# Patient Record
Sex: Female | Born: 1940
Health system: Southern US, Community
[De-identification: ages and names within clinical notes are randomized; demographics above are authoritative.]

## PROBLEM LIST (undated history)

## (undated) DIAGNOSIS — I10 Essential (primary) hypertension: Secondary | ICD-10-CM

## (undated) DIAGNOSIS — E7800 Pure hypercholesterolemia, unspecified: Secondary | ICD-10-CM

## (undated) DIAGNOSIS — G43909 Migraine, unspecified, not intractable, without status migrainosus: Secondary | ICD-10-CM

## (undated) DIAGNOSIS — T8859XA Other complications of anesthesia, initial encounter: Secondary | ICD-10-CM

## (undated) DIAGNOSIS — F32A Depression, unspecified: Secondary | ICD-10-CM

## (undated) DIAGNOSIS — F329 Major depressive disorder, single episode, unspecified: Secondary | ICD-10-CM

## (undated) DIAGNOSIS — H409 Unspecified glaucoma: Secondary | ICD-10-CM

## (undated) DIAGNOSIS — Z972 Presence of dental prosthetic device (complete) (partial): Secondary | ICD-10-CM

## (undated) DIAGNOSIS — M1990 Unspecified osteoarthritis, unspecified site: Secondary | ICD-10-CM

## (undated) DIAGNOSIS — T4145XA Adverse effect of unspecified anesthetic, initial encounter: Secondary | ICD-10-CM

## (undated) DIAGNOSIS — D649 Anemia, unspecified: Secondary | ICD-10-CM

## (undated) DIAGNOSIS — F419 Anxiety disorder, unspecified: Secondary | ICD-10-CM

## (undated) DIAGNOSIS — M810 Age-related osteoporosis without current pathological fracture: Secondary | ICD-10-CM

## (undated) DIAGNOSIS — K219 Gastro-esophageal reflux disease without esophagitis: Secondary | ICD-10-CM

## (undated) DIAGNOSIS — R42 Dizziness and giddiness: Secondary | ICD-10-CM

## (undated) DIAGNOSIS — H5789 Other specified disorders of eye and adnexa: Secondary | ICD-10-CM

## (undated) DIAGNOSIS — R29898 Other symptoms and signs involving the musculoskeletal system: Secondary | ICD-10-CM

## (undated) HISTORY — PX: SUBCLAVIAN ANGIOGRAM: SHX5350

## (undated) HISTORY — DX: Major depressive disorder, single episode, unspecified: F32.9

## (undated) HISTORY — PX: KNEE SURGERY: SHX244

## (undated) HISTORY — DX: Depression, unspecified: F32.A

## (undated) HISTORY — PX: EYE SURGERY: SHX253

## (undated) HISTORY — DX: Dizziness and giddiness: R42

## (undated) HISTORY — DX: Pure hypercholesterolemia, unspecified: E78.00

## (undated) HISTORY — PX: ABDOMINAL HYSTERECTOMY: SHX81

## (undated) HISTORY — PX: OTHER SURGICAL HISTORY: SHX169

## (undated) HISTORY — PX: WISDOM TOOTH EXTRACTION: SHX21

## (undated) HISTORY — DX: Essential (primary) hypertension: I10

## (undated) HISTORY — DX: Gastro-esophageal reflux disease without esophagitis: K21.9

## (undated) HISTORY — PX: ANAL FISSURE REPAIR: SHX2312

## (undated) HISTORY — DX: Anxiety disorder, unspecified: F41.9

---
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
## 2007-06-24 ENCOUNTER — Encounter: Payer: Self-pay | Admitting: Neurology

## 2007-06-29 ENCOUNTER — Ambulatory Visit (HOSPITAL_COMMUNITY): Admission: RE | Admit: 2007-06-29 | Discharge: 2007-06-29 | Payer: Self-pay | Admitting: Interventional Radiology

## 2007-10-04 ENCOUNTER — Ambulatory Visit: Payer: Self-pay | Admitting: Gastroenterology

## 2010-11-16 HISTORY — PX: COLONOSCOPY: SHX174

## 2010-11-16 HISTORY — PX: UPPER GI ENDOSCOPY: SHX6162

## 2011-08-31 LAB — PROTIME-INR
INR: 1
Prothrombin Time: 13.3

## 2011-08-31 LAB — BASIC METABOLIC PANEL
BUN: 19
CO2: 20
Calcium: 9.4
Chloride: 111
Creatinine, Ser: 0.87
GFR calc Af Amer: >60
GFR calc non Af Amer: >60
Glucose, Bld: 88
Potassium: 3.5
Sodium: 142

## 2011-08-31 LAB — APTT: aPTT: 31

## 2011-08-31 LAB — CBC
HCT: 40.3
Hemoglobin: 13.7
MCHC: 33.9
MCV: 85.4
Platelets: 207
RBC: 4.72
RDW: 13.3
WBC: 6.5

## 2014-02-23 ENCOUNTER — Ambulatory Visit: Payer: Self-pay | Admitting: Family Medicine

## 2014-08-23 ENCOUNTER — Ambulatory Visit: Payer: Self-pay | Admitting: Family Medicine

## 2014-11-21 DIAGNOSIS — N812 Incomplete uterovaginal prolapse: Secondary | ICD-10-CM | POA: Diagnosis not present

## 2015-01-23 DIAGNOSIS — E784 Other hyperlipidemia: Secondary | ICD-10-CM | POA: Diagnosis not present

## 2015-01-23 DIAGNOSIS — R05 Cough: Secondary | ICD-10-CM | POA: Diagnosis not present

## 2015-01-23 DIAGNOSIS — I10 Essential (primary) hypertension: Secondary | ICD-10-CM | POA: Diagnosis not present

## 2015-01-31 DIAGNOSIS — F321 Major depressive disorder, single episode, moderate: Secondary | ICD-10-CM | POA: Diagnosis not present

## 2015-03-12 DIAGNOSIS — E785 Hyperlipidemia, unspecified: Secondary | ICD-10-CM | POA: Diagnosis not present

## 2015-03-12 DIAGNOSIS — I10 Essential (primary) hypertension: Secondary | ICD-10-CM | POA: Diagnosis not present

## 2015-03-13 DIAGNOSIS — N819 Female genital prolapse, unspecified: Secondary | ICD-10-CM | POA: Diagnosis not present

## 2015-04-25 ENCOUNTER — Other Ambulatory Visit: Payer: Self-pay | Admitting: Psychiatry

## 2015-05-06 DIAGNOSIS — H04123 Dry eye syndrome of bilateral lacrimal glands: Secondary | ICD-10-CM | POA: Diagnosis not present

## 2015-05-06 DIAGNOSIS — H4011X2 Primary open-angle glaucoma, moderate stage: Secondary | ICD-10-CM | POA: Diagnosis not present

## 2015-05-07 DIAGNOSIS — H524 Presbyopia: Secondary | ICD-10-CM | POA: Diagnosis not present

## 2015-05-07 DIAGNOSIS — H5210 Myopia, unspecified eye: Secondary | ICD-10-CM | POA: Diagnosis not present

## 2015-05-29 ENCOUNTER — Encounter: Payer: Self-pay | Admitting: Family Medicine

## 2015-06-03 DIAGNOSIS — H4011X2 Primary open-angle glaucoma, moderate stage: Secondary | ICD-10-CM | POA: Diagnosis not present

## 2015-06-03 DIAGNOSIS — H04123 Dry eye syndrome of bilateral lacrimal glands: Secondary | ICD-10-CM | POA: Diagnosis not present

## 2015-06-04 ENCOUNTER — Encounter: Payer: Self-pay | Admitting: Family Medicine

## 2015-06-04 ENCOUNTER — Ambulatory Visit (INDEPENDENT_AMBULATORY_CARE_PROVIDER_SITE_OTHER): Payer: Commercial Managed Care - HMO | Admitting: Family Medicine

## 2015-06-04 VITALS — BP 130/78 | HR 86 | Temp 99.4°F | Resp 18 | Ht 63.0 in | Wt 165.2 lb

## 2015-06-04 DIAGNOSIS — R29898 Other symptoms and signs involving the musculoskeletal system: Secondary | ICD-10-CM | POA: Diagnosis not present

## 2015-06-04 DIAGNOSIS — Z780 Asymptomatic menopausal state: Secondary | ICD-10-CM | POA: Diagnosis not present

## 2015-06-04 DIAGNOSIS — Z0000 Encounter for general adult medical examination without abnormal findings: Secondary | ICD-10-CM | POA: Diagnosis not present

## 2015-06-04 NOTE — Progress Notes (Signed)
Name: [name]   MRN: [id number]    DOB: [birth year]   Date:06/04/2015       Progress Note  Subjective  Chief Complaint  Chief Complaint  Patient presents with  . Annual Exam    CPE  . Hyperlipidemia  . Depression  . Hypertension  . Referral    for Leg pain    HPI Pt. Is here for a Complete Physical Exam. She is doing well and gets her GYN exam in [place]. Past Medical History  Diagnosis Date  . Depression   . Anxiety   . Hypertension   . GERD (gastroesophageal reflux disease)     Past Surgical History  Procedure Laterality Date  . Knee surgery Left     Family History  Problem Relation Age of Onset  . Cancer Mother     colon  . Cancer Brother     prostate  . Lymphoma Son     History   Social History  . Marital Status: Married    Spouse Name: N/A  . Number of Children: N/A  . Years of Education: N/A   Occupational History  . Not on file.   Social History Main Topics  . Smoking status: Never Smoker   . Smokeless tobacco: Never Used  . Alcohol Use: No  . Drug Use: No  . Sexual Activity: Not on file   Other Topics Concern  . Not on file   Social History Narrative  . No narrative on file     Current outpatient prescriptions:  .  ALPRAZolam (XANAX) 0.25 MG tablet, Take by mouth., Disp: , Rfl:  .  aspirin 81 MG tablet, Take 1 tablet by mouth daily., Disp: , Rfl:  .  COMBIGAN 0.2-0.5 % ophthalmic solution, INSTILL 1 DROP IN BOTH EYES TWICE A DAY, Disp: , Rfl: 3 .  Light Mineral Oil-Mineral Oil (RETAINE MGD OP), Apply to eye., Disp: , Rfl:  .  losartan (COZAAR) 50 MG tablet, Take 50 mg by mouth daily., Disp: , Rfl: 5 .  LUMIGAN 0.01 % SOLN, Place 1 drop into both eyes at bedtime., Disp: , Rfl: 1 .  mirtazapine (REMERON) 15 MG tablet, TAKE 1 TABLET AT BEDTIME, Disp: 30 tablet, Rfl: 1 .  Naproxen Sodium (ALEVE) 220 MG CAPS, Take 220 mg by mouth as needed., Disp: , Rfl:  .  omeprazole (PRILOSEC) 20 MG capsule, Take 20 mg by mouth 2 (two)  times daily., Disp: , Rfl: 5 .  Polyethyl Glycol-Propyl Glycol (SYSTANE OP), Apply to eye., Disp: , Rfl:   Allergies  Allergen Reactions  . Codeine   . Sulfa Antibiotics      Review of Systems  Constitutional: Positive for malaise/fatigue. Negative for fever, chills and weight loss.  HENT: Positive for congestion. Negative for ear pain, nosebleeds and sore throat.   Eyes: Positive for double vision. Negative for blurred vision, pain and redness.  Respiratory: Positive for wheezing. Negative for cough and shortness of breath.   Cardiovascular: Positive for leg swelling (intermittent bilateral leg swelling for 2 weeks). Negative for chest pain and palpitations.  Gastrointestinal: Negative for heartburn, nausea, vomiting, abdominal pain and blood in stool.  Genitourinary: Negative for dysuria, frequency and hematuria.  Musculoskeletal: Positive for myalgias and joint pain (intermittent right knee pain, and legs give out at times.). Negative for back pain, falls and neck pain.  Skin: Negative for rash.  Neurological: Positive for weakness. Negative for dizziness, seizures and headaches.  Endo/Heme/Allergies: Bruises/bleeds easily (pt. on ASA).  Psychiatric/Behavioral: Positive for depression. Negative for suicidal ideas and memory loss. The patient is nervous/anxious and has insomnia (stable on medication.).      Objective  Filed Vitals:   06/04/15 0922  BP: 130/78  Pulse: 86  Temp: 99.4 F (37.4 C)  TempSrc: Oral  Resp: 18  Height: 5\' 3"  (1.6 m)  Weight: 165 lb 3.2 oz (74.934 kg)  SpO2: 94%    Physical Exam  Constitutional: She is oriented to person, place, and time and well-developed, well-nourished, and in no distress.  HENT:  Head: Normocephalic and atraumatic.  Nose: No mucosal edema or sinus tenderness.  Mouth/Throat: No oropharyngeal exudate, posterior oropharyngeal edema or posterior oropharyngeal erythema.  Eyes: Pupils are equal, round, and reactive to light.   Neck: Normal range of motion. Neck supple.  Cardiovascular: Normal rate and regular rhythm.   Pulmonary/Chest: Effort normal and breath sounds normal.  Abdominal: Soft. Bowel sounds are normal.  Musculoskeletal:       Right knee: She exhibits normal range of motion, no swelling and no erythema.  Neurological: She is alert and oriented to person, place, and time.  Skin: Skin is warm and dry.  Psychiatric: Mood, memory, affect and judgment normal.  Nursing note and vitals reviewed.    Assessment & Plan 1. Annual physical exam Pt.'s last screening colonoscopy was in 2012. She will contact the gastroenterologist to determine the date for next colonoscopy. - Lipid Profile - TSH - Vitamin D (25 hydroxy) - Comprehensive metabolic panel - CBC w/Diff - HgB A1c  2. Post-menopausal  - DG Bone Density; Future  3. Bilateral leg weakness Pt. Requesting referral to orthopedic surgeon Dr. Marlou Sa who operated on her left knee in the past.  - Ambulatory referral to Orthopedic Surgery    Heru Montz [facility] 06/04/2015 9:51 AM

## 2015-06-05 LAB — COMPREHENSIVE METABOLIC PANEL
ALT: 19 IU/L (ref 0–32)
AST: 18 IU/L (ref 0–40)
Albumin/Globulin Ratio: 1.6 (ref 1.1–2.5)
Albumin: 4.4 g/dL (ref 3.5–4.8)
Alkaline Phosphatase: 75 IU/L (ref 39–117)
BUN/Creatinine Ratio: 30 — ABNORMAL HIGH (ref 11–26)
BUN: 23 mg/dL (ref 8–27)
Bilirubin Total: 0.4 mg/dL (ref 0.0–1.2)
CO2: 24 mmol/L (ref 18–29)
Calcium: 9.9 mg/dL (ref 8.7–10.3)
Chloride: 108 mmol/L (ref 97–108)
Creatinine, Ser: 0.76 mg/dL (ref 0.57–1.00)
GFR calc Af Amer: 89 mL/min/{1.73_m2} (ref >59)
GFR calc non Af Amer: 78 mL/min/{1.73_m2} (ref >59)
Globulin, Total: 2.7 g/dL (ref 1.5–4.5)
Glucose: 91 mg/dL (ref 65–99)
Potassium: 5.3 mmol/L — ABNORMAL HIGH (ref 3.5–5.2)
Sodium: 147 mmol/L — ABNORMAL HIGH (ref 134–144)
Total Protein: 7.1 g/dL (ref 6.0–8.5)

## 2015-06-05 LAB — VITAMIN D 25 HYDROXY (VIT D DEFICIENCY, FRACTURES): Vit D, 25-Hydroxy: 30.1 ng/mL (ref 30.0–100.0)

## 2015-06-05 LAB — CBC WITH DIFFERENTIAL/PLATELET
Basophils Absolute: 0 10*3/uL (ref 0.0–0.2)
Basos: 1 %
EOS (ABSOLUTE): 0.3 10*3/uL (ref 0.0–0.4)
Eos: 5 %
Hematocrit: 39.6 % (ref 34.0–46.6)
Hemoglobin: 13.3 g/dL (ref 11.1–15.9)
Immature Grans (Abs): 0 10*3/uL (ref 0.0–0.1)
Immature Granulocytes: 0 %
Lymphocytes Absolute: 2.6 10*3/uL (ref 0.7–3.1)
Lymphs: 39 %
MCH: 27.9 pg (ref 26.6–33.0)
MCHC: 33.6 g/dL (ref 31.5–35.7)
MCV: 83 fL (ref 79–97)
Monocytes Absolute: 0.4 10*3/uL (ref 0.1–0.9)
Monocytes: 7 %
Neutrophils Absolute: 3.3 10*3/uL (ref 1.4–7.0)
Neutrophils: 48 %
Platelets: 254 10*3/uL (ref 150–379)
RBC: 4.77 x10E6/uL (ref 3.77–5.28)
RDW: 14.8 % (ref 12.3–15.4)
WBC: 6.7 10*3/uL (ref 3.4–10.8)

## 2015-06-05 LAB — TSH: TSH: 3.87 u[IU]/mL (ref 0.450–4.500)

## 2015-06-05 LAB — LIPID PANEL
Chol/HDL Ratio: 4.5 ratio units — ABNORMAL HIGH (ref 0.0–4.4)
Cholesterol, Total: 248 mg/dL — ABNORMAL HIGH (ref 100–199)
HDL: 55 mg/dL (ref >39)
LDL Calculated: 162 mg/dL — ABNORMAL HIGH (ref 0–99)
Triglycerides: 157 mg/dL — ABNORMAL HIGH (ref 0–149)
VLDL Cholesterol Cal: 31 mg/dL (ref 5–40)

## 2015-06-05 LAB — HEMOGLOBIN A1C
Est. average glucose Bld gHb Est-mCnc: 120 mg/dL
Hgb A1c MFr Bld: 5.8 % — ABNORMAL HIGH (ref 4.8–5.6)

## 2015-06-06 ENCOUNTER — Other Ambulatory Visit: Payer: Self-pay | Admitting: Family Medicine

## 2015-06-06 DIAGNOSIS — E875 Hyperkalemia: Secondary | ICD-10-CM

## 2015-06-06 MED ORDER — ATORVASTATIN CALCIUM 20 MG PO TABS: 20.0000 mg | ORAL_TABLET | Freq: Every day | ORAL | Status: DC

## 2015-06-06 NOTE — Telephone Encounter (Signed)
Per Dr. Manuella Ghazi filled Lipitor 20 mg sent to [facility] [address], result were reported to Patient.

## 2015-06-07 ENCOUNTER — Other Ambulatory Visit: Payer: Self-pay | Admitting: Family Medicine

## 2015-06-07 DIAGNOSIS — E875 Hyperkalemia: Secondary | ICD-10-CM | POA: Diagnosis not present

## 2015-06-08 LAB — BASIC METABOLIC PANEL
BUN/Creatinine Ratio: 21 (ref 11–26)
BUN: 15 mg/dL (ref 8–27)
CO2: 26 mmol/L (ref 18–29)
Calcium: 10 mg/dL (ref 8.7–10.3)
Chloride: 106 mmol/L (ref 97–108)
Creatinine, Ser: 0.72 mg/dL (ref 0.57–1.00)
GFR calc Af Amer: 95 mL/min/{1.73_m2} (ref >59)
GFR calc non Af Amer: 83 mL/min/{1.73_m2} (ref >59)
Glucose: 91 mg/dL (ref 65–99)
Potassium: 4.7 mmol/L (ref 3.5–5.2)
Sodium: 148 mmol/L — ABNORMAL HIGH (ref 134–144)

## 2015-06-11 ENCOUNTER — Other Ambulatory Visit: Payer: Self-pay | Admitting: Family Medicine

## 2015-06-11 DIAGNOSIS — E870 Hyperosmolality and hypernatremia: Secondary | ICD-10-CM

## 2015-06-14 ENCOUNTER — Telehealth: Payer: Self-pay | Admitting: Family Medicine

## 2015-06-14 NOTE — Telephone Encounter (Signed)
Patient is requesting a lab order will pick it up Wednesday for her Sodium check

## 2015-06-17 NOTE — Telephone Encounter (Signed)
Orders for BMP is ready for patient to pick up at front desk.

## 2015-06-19 ENCOUNTER — Other Ambulatory Visit: Payer: Self-pay | Admitting: Family Medicine

## 2015-06-19 DIAGNOSIS — E870 Hyperosmolality and hypernatremia: Secondary | ICD-10-CM | POA: Diagnosis not present

## 2015-06-20 ENCOUNTER — Encounter: Payer: Self-pay | Admitting: Family Medicine

## 2015-06-20 ENCOUNTER — Ambulatory Visit (INDEPENDENT_AMBULATORY_CARE_PROVIDER_SITE_OTHER): Payer: Commercial Managed Care - HMO | Admitting: Family Medicine

## 2015-06-20 VITALS — BP 132/74 | HR 81 | Temp 99.1°F | Resp 18 | Ht 63.0 in | Wt 165.9 lb

## 2015-06-20 DIAGNOSIS — N649 Disorder of breast, unspecified: Secondary | ICD-10-CM | POA: Insufficient documentation

## 2015-06-20 DIAGNOSIS — R600 Localized edema: Secondary | ICD-10-CM | POA: Diagnosis not present

## 2015-06-20 DIAGNOSIS — F4321 Adjustment disorder with depressed mood: Secondary | ICD-10-CM | POA: Insufficient documentation

## 2015-06-20 DIAGNOSIS — M25519 Pain in unspecified shoulder: Secondary | ICD-10-CM | POA: Insufficient documentation

## 2015-06-20 DIAGNOSIS — I10 Essential (primary) hypertension: Secondary | ICD-10-CM | POA: Insufficient documentation

## 2015-06-20 DIAGNOSIS — R9089 Other abnormal findings on diagnostic imaging of central nervous system: Secondary | ICD-10-CM | POA: Insufficient documentation

## 2015-06-20 DIAGNOSIS — Z8679 Personal history of other diseases of the circulatory system: Secondary | ICD-10-CM | POA: Insufficient documentation

## 2015-06-20 DIAGNOSIS — F32A Depression, unspecified: Secondary | ICD-10-CM | POA: Insufficient documentation

## 2015-06-20 DIAGNOSIS — Z634 Disappearance and death of family member: Secondary | ICD-10-CM

## 2015-06-20 DIAGNOSIS — E870 Hyperosmolality and hypernatremia: Secondary | ICD-10-CM

## 2015-06-20 DIAGNOSIS — F419 Anxiety disorder, unspecified: Secondary | ICD-10-CM | POA: Insufficient documentation

## 2015-06-20 DIAGNOSIS — K921 Melena: Secondary | ICD-10-CM | POA: Insufficient documentation

## 2015-06-20 DIAGNOSIS — F4329 Adjustment disorder with other symptoms: Secondary | ICD-10-CM | POA: Insufficient documentation

## 2015-06-20 DIAGNOSIS — R29898 Other symptoms and signs involving the musculoskeletal system: Secondary | ICD-10-CM

## 2015-06-20 DIAGNOSIS — R42 Dizziness and giddiness: Secondary | ICD-10-CM | POA: Insufficient documentation

## 2015-06-20 DIAGNOSIS — H409 Unspecified glaucoma: Secondary | ICD-10-CM | POA: Insufficient documentation

## 2015-06-20 DIAGNOSIS — F329 Major depressive disorder, single episode, unspecified: Secondary | ICD-10-CM | POA: Insufficient documentation

## 2015-06-20 DIAGNOSIS — E785 Hyperlipidemia, unspecified: Secondary | ICD-10-CM | POA: Insufficient documentation

## 2015-06-20 DIAGNOSIS — G939 Disorder of brain, unspecified: Secondary | ICD-10-CM | POA: Insufficient documentation

## 2015-06-20 DIAGNOSIS — B029 Zoster without complications: Secondary | ICD-10-CM | POA: Insufficient documentation

## 2015-06-20 LAB — BASIC METABOLIC PANEL
BUN/Creatinine Ratio: 32 — ABNORMAL HIGH (ref 11–26)
BUN: 23 mg/dL (ref 8–27)
CO2: 26 mmol/L (ref 18–29)
Calcium: 9.6 mg/dL (ref 8.7–10.3)
Chloride: 109 mmol/L — ABNORMAL HIGH (ref 97–108)
Creatinine, Ser: 0.71 mg/dL (ref 0.57–1.00)
GFR calc Af Amer: 97 mL/min/{1.73_m2} (ref >59)
GFR calc non Af Amer: 84 mL/min/{1.73_m2} (ref >59)
Glucose: 90 mg/dL (ref 65–99)
Potassium: 4.9 mmol/L (ref 3.5–5.2)
Sodium: 149 mmol/L — ABNORMAL HIGH (ref 134–144)

## 2015-06-20 NOTE — Progress Notes (Signed)
Name: [name]   MRN: [id number]    DOB: [birth year]   Date:06/20/2015       Progress Note  Subjective  Chief Complaint  Chief Complaint  Patient presents with  . Follow-up    Elevated Sodium levels    HPI  [name] is a pleasant 74 year old female who is presents to clinic for evaluation of possible causes for recent lab work ordered by her PCP showing slightly elevated sodium. Serum sodium ranges have been 147-149 mmol/L over the past 2 weeks. She denies any symptoms of nausea, vomiting, diarrhea, fevers. She drinks about 2 cups of water a day but also has coffee and ginger ale. Overall she has not been feeling her usual self. Her legs give way when she walks, they feel tired and achy and by the end of the day she feels that her legs are swollen.     Patient Active Problem List   Diagnosis Date Noted  . Abnormal brain CT 06/20/2015  . Anxiety, mild 06/20/2015  . Blood in feces 06/20/2015  . Brain lesion 06/20/2015  . 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 Clinical depression 06/20/2015  . Dyslipidemia 06/20/2015  . Dizziness 06/20/2015  . HLD (hyperlipidemia) 06/20/2015  . Benign hypertension 06/20/2015  . Affective disorder, major 06/20/2015  . Arthralgia of shoulder 06/20/2015  . Herpes zona 06/20/2015  . Breast disease 06/20/2015  . Pain in shoulder 06/20/2015  . Glaucoma 06/20/2015  . Hypernatremia 06/20/2015  . Bilateral lower extremity edema 06/20/2015  . Annual physical exam 06/04/2015  . Post-menopausal 06/04/2015  . Bilateral leg weakness 06/04/2015    History  Substance Use Topics  . Smoking status: Never Smoker   . Smokeless tobacco: Never Used  . Alcohol Use: No     Current outpatient prescriptions:  .  ALPRAZolam (XANAX) 0.25 MG tablet, Take by mouth., Disp: , Rfl:  .  aspirin 81 MG tablet, Take 1 tablet by mouth daily., Disp: , Rfl:  .  atorvastatin (LIPITOR) 20 MG tablet, Take 1 tablet (20 mg total) by mouth at bedtime., Disp:  90 tablet, Rfl: 0 .  COMBIGAN 0.2-0.5 % ophthalmic solution, INSTILL 1 DROP IN BOTH EYES TWICE A DAY, Disp: , Rfl: 3 .  Light Mineral Oil-Mineral Oil (RETAINE MGD OP), Apply to eye., Disp: , Rfl:  .  losartan (COZAAR) 50 MG tablet, Take 50 mg by mouth daily., Disp: , Rfl: 5 .  LUMIGAN 0.01 % SOLN, Place 1 drop into both eyes at bedtime., Disp: , Rfl: 1 .  mirtazapine (REMERON) 15 MG tablet, TAKE 1 TABLET AT BEDTIME, Disp: 30 tablet, Rfl: 1 .  Naproxen Sodium (ALEVE) 220 MG CAPS, Take 220 mg by mouth as needed., Disp: , Rfl:  .  omeprazole (PRILOSEC) 20 MG capsule, Take 20 mg by mouth 2 (two) times daily., Disp: , Rfl: 5 .  Polyethyl Glycol-Propyl Glycol (SYSTANE OP), Apply to eye., Disp: , Rfl:   Past Surgical History  Procedure Laterality Date  . Knee surgery Left     Family History  Problem Relation Age of Onset  . Cancer Mother     colon  . Cancer Brother     prostate  . Lymphoma Son     Allergies  Allergen Reactions  . Codeine   . Sulfa Antibiotics      Review of Systems  CONSTITUTIONAL: No significant weight changes, fever, chills, weakness or fatigue.  HEENT:  - Eyes: No visual changes.  - Ears: No auditory changes.  No pain.  - Nose: No sneezing, congestion, runny nose. - Throat: No sore throat. No changes in swallowing. SKIN: No rash or itching.  CARDIOVASCULAR: No chest pain, chest pressure or chest discomfort. No palpitations. Yes edema. RESPIRATORY: No shortness of breath, cough or sputum.  GASTROINTESTINAL: No anorexia, nausea, vomiting. No changes in bowel habits. No abdominal pain or blood.  GENITOURINARY: No dysuria. No frequency. No discharge.  NEUROLOGICAL: No headache, dizziness, syncope, paralysis, ataxia, numbness or tingling in the extremities. No memory changes. No change in bowel or bladder control.  MUSCULOSKELETAL: Yes joint pain. No muscle pain. HEMATOLOGIC: No anemia, bleeding or bruising.  LYMPHATICS: No enlarged lymph nodes.  PSYCHIATRIC: No  change in mood. No change in sleep pattern.  ENDOCRINOLOGIC: No reports of sweating, cold or heat intolerance. No polyuria or polydipsia.     Objective  BP 132/74 mmHg  Pulse 81  Temp(Src) 99.1 F (37.3 C) (Oral)  Resp 18  Ht 5\' 3"  (1.6 m)  Wt 165 lb 14.4 oz (75.252 kg)  BMI 29.40 kg/m2  SpO2 95% Body mass index is 29.4 kg/(m^2).  Orthostatic vitals Lying: 134/78 mmHg Sitting: 131/76 mmHg Standing: 132/71 mmHg  Physical Exam  Constitutional: Patient appears well-developed and well-nourished. In no distress.  HEENT:  - Head: Normocephalic and atraumatic.  - Ears: Bilateral TMs gray, no erythema or effusion - Nose: Nasal mucosa moist - Mouth/Throat: Oropharynx is clear and mildly dry. No tonsillar hypertrophy or erythema. No post nasal drainage.  - Eyes: Conjunctivae clear, EOM movements normal. PERRLA. No scleral icterus.  Neck: Normal range of motion. Neck supple. No JVD present. No thyromegaly present.  Cardiovascular: Normal rate, regular rhythm and normal heart sounds.  No murmur heard.  Pulmonary/Chest: Effort normal and breath sounds normal. No respiratory distress. Musculoskeletal: Normal range of motion bilateral UE and LE with significant crepitus in knees. Peripheral vascular: Bilateral LE trace pedal pitting edema. Neurological: CN II-XII grossly intact with no focal deficits. Alert and oriented to person, place, and time. Coordination, balance, strength, speech and gait are normal.  Skin: Skin is warm and dry. No rash noted. No erythema. Skin turgor decreased. Psychiatric: Patient has a normal mood and affect. Behavior is normal in office today. Judgment and thought content normal in office today.   Recent Results (from the past 2160 hour(s))  Lipid Profile     Status: Abnormal   Collection Time: 06/04/15 10:29 AM  Result Value Ref Range   Cholesterol, Total 248 (H) 100 - 199 mg/dL   Triglycerides 157 (H) 0 - 149 mg/dL   HDL 55 >39 mg/dL    Comment:  According to ATP-III Guidelines, HDL-C >59 mg/dL is considered a negative risk factor for CHD.    VLDL Cholesterol Cal 31 5 - 40 mg/dL   LDL Calculated 162 (H) 0 - 99 mg/dL   Chol/HDL Ratio 4.5 (H) 0.0 - 4.4 ratio units    Comment:                                   T. Chol/HDL Ratio                                             Men  Women  1/2 Avg.Risk  3.4    3.3                                   Avg.Risk  5.0    4.4                                2X Avg.Risk  9.6    7.1                                3X Avg.Risk 23.4   11.0   TSH     Status: None   Collection Time: 06/04/15 10:29 AM  Result Value Ref Range   TSH 3.870 0.450 - 4.500 uIU/mL  Vitamin D (25 hydroxy)     Status: None   Collection Time: 06/04/15 10:29 AM  Result Value Ref Range   Vit D, 25-Hydroxy 30.1 30.0 - 100.0 ng/mL    Comment: Vitamin D deficiency has been defined by the [place] and an Endocrine Society practice guideline as a level of serum 25-OH vitamin D less than 20 ng/mL (1,2). The Endocrine Society went on to further define vitamin D insufficiency as a level between 21 and 29 ng/mL (2). 1. IOM (Institute of Medicine). 2010. Dietary reference    intakes for calcium and D. [place]: The    [employer]. 2. Holick MF, Binkley , Bischoff-Ferrari HA, et al.    Evaluation, treatment, and prevention of vitamin D    deficiency: an Endocrine Society clinical practice    guideline. JCEM. 2011 Jul; 96(7):1911-30.   Comprehensive metabolic panel     Status: Abnormal   Collection Time: 06/04/15 10:29 AM  Result Value Ref Range   Glucose 91 65 - 99 mg/dL   BUN 23 8 - 27 mg/dL   Creatinine, Ser 0.76 0.57 - 1.00 mg/dL   GFR calc non Af Amer 78 >59 mL/min/1.73   GFR calc Af Amer 89 >59 mL/min/1.73   BUN/Creatinine Ratio 30 (H) 11 - 26   Sodium 147 (H) 134 - 144 mmol/L   Potassium 5.3 (H) 3.5 - 5.2 mmol/L   Chloride 108 97 - 108 mmol/L   CO2 24 18 - 29  mmol/L   Calcium 9.9 8.7 - 10.3 mg/dL   Total Protein 7.1 6.0 - 8.5 g/dL   Albumin 4.4 3.5 - 4.8 g/dL   Globulin, Total 2.7 1.5 - 4.5 g/dL   Albumin/Globulin Ratio 1.6 1.1 - 2.5   Bilirubin Total 0.4 0.0 - 1.2 mg/dL   Alkaline Phosphatase 75 39 - 117 IU/L   AST 18 0 - 40 IU/L   ALT 19 0 - 32 IU/L  CBC w/Diff     Status: None   Collection Time: 06/04/15 10:29 AM  Result Value Ref Range   WBC 6.7 3.4 - 10.8 x10E3/uL   RBC 4.77 3.77 - 5.28 x10E6/uL   Hemoglobin 13.3 11.1 - 15.9 g/dL   Hematocrit 39.6 34.0 - 46.6 %   MCV 83 79 - 97 fL   MCH 27.9 26.6 - 33.0 pg   MCHC 33.6 31.5 - 35.7 g/dL   RDW 14.8 12.3 - 15.4 %   Platelets 254 150 - 379 x10E3/uL   Neutrophils 48 %   Lymphs 39 %   Monocytes 7 %   Eos  5 %   Basos 1 %   Neutrophils Absolute 3.3 1.4 - 7.0 x10E3/uL   Lymphocytes Absolute 2.6 0.7 - 3.1 x10E3/uL   Monocytes Absolute 0.4 0.1 - 0.9 x10E3/uL   EOS (ABSOLUTE) 0.3 0.0 - 0.4 x10E3/uL   Basophils Absolute 0.0 0.0 - 0.2 x10E3/uL   Immature Granulocytes 0 %   Immature Grans (Abs) 0.0 0.0 - 0.1 x10E3/uL  HgB A1c     Status: Abnormal   Collection Time: 06/04/15 10:29 AM  Result Value Ref Range   Hgb A1c MFr Bld 5.8 (H) 4.8 - 5.6 %    Comment:          Pre-diabetes: 5.7 - 6.4          Diabetes: >6.4          Glycemic control for adults with diabetes: <7.0    Est. average glucose Bld gHb Est-mCnc 120 mg/dL  Basic metabolic panel     Status: Abnormal   Collection Time: 06/07/15 10:33 AM  Result Value Ref Range   Glucose 91 65 - 99 mg/dL   BUN 15 8 - 27 mg/dL   Creatinine, Ser 0.72 0.57 - 1.00 mg/dL   GFR calc non Af Amer 83 >59 mL/min/1.73   GFR calc Af Amer 95 >59 mL/min/1.73   BUN/Creatinine Ratio 21 11 - 26   Sodium 148 (H) 134 - 144 mmol/L   Potassium 4.7 3.5 - 5.2 mmol/L   Chloride 106 97 - 108 mmol/L   CO2 26 18 - 29 mmol/L   Calcium 10.0 8.7 - 10.3 mg/dL  Basic metabolic panel     Status: Abnormal   Collection Time: 06/19/15 10:26 AM  Result Value Ref Range    Glucose 90 65 - 99 mg/dL   BUN 23 8 - 27 mg/dL   Creatinine, Ser 0.71 0.57 - 1.00 mg/dL   GFR calc non Af Amer 84 >59 mL/min/1.73   GFR calc Af Amer 97 >59 mL/min/1.73   BUN/Creatinine Ratio 32 (H) 11 - 26   Sodium 149 (H) 134 - 144 mmol/L   Potassium 4.9 3.5 - 5.2 mmol/L   Chloride 109 (H) 97 - 108 mmol/L   CO2 26 18 - 29 mmol/L   Calcium 9.6 8.7 - 10.3 mg/dL     Assessment & Plan  1. Hypernatremia Etiology of hypernatremia unclear at this time but more likely due to hypovolemic hypernatremia, volume depletion as she only drinks 1-2 cups of water a day. Medication list has been reviewed and no causative agent has been identified. Clinically Catherine. Alverio remains stable. She has been instructed to ingest at least eight 8oz glasses of water daily and avoid elevated temperatures that could contribute to dehydration. Lab work slip given to redraw her blood work in 2 weeks time.   - Basic Metabolic Panel (BMET); Future   2. Bilateral leg weakness Consider peripheral arterial disease get ankle brachial index near future or consider venous valve insufficiency with vascular consult.  3. Bilateral lower extremity edema Consider fluid third spacing or cardiac disease. May benefit from Echo cardiogram.

## 2015-06-20 NOTE — Patient Instructions (Signed)
Hypernatremia Hypernatremia is when there is too much salt (sodium) in the blood and not enough water. The cells of the brain become starved of water. This balance of water and salt in the body is vital to human function. Hypernatremia is rare and is often an emergency. It can happen to anyone, but it usually occurs in infants, the elderly, or the critically ill. In severe cases, hypernatremia can be life-threatening. CAUSES Hypernatremia may be the result of:  Not drinking enough water. Impaired thirst sensation usually contributes to this cause.  A condition where your kidneys remove too much urine from the body (polyuria). This is often related to diabetes.  Water loss through exercise or sweating.  Diarrhea or vomiting.  Excessive salt intake.  A genetic condition affecting the kidneys. SYMPTOMS  Increased thirst (unless this sensation is impaired).  Increased urination.  Muscle twitching or cramps.  Dizziness or lightheadedness.  Seizures.  Headache.  Fatigue.  Generalized weakness.  Irritability.  Confusion.  Loss of consciousness.  Coma. Symptoms of underlying illness that can lead to hypernatremia may also be present, such as:  Nausea.  Vomiting.  Diarrhea.  Fever.  Confusion (delirium).  Memory problems (dementia).  Diabetes.  A condition that affects the adrenal glands and results in too much cortisol (Cushing's syndrome). DIAGNOSIS  Your caregiver will take your history and ask questions regarding water intake, potential water loss, urination, salt intake, medicines, and other symptoms. Your caregiver may also perform a physical or neurological exam. He or she may also order tests, such as blood and urine tests. TREATMENT Once diagnosed, hypernatremia needs to be treated right away. The goal is to balance the water and salt levels in the body. Treatment may be given through an intravenous line (IV) or given by mouth. Treatment often requires  hospitalization for IV fluids and careful monitoring of lab tests. Treatment for hypernatremia will include intake of one or more of the following:  Water.  IV fluids. Ongoing laboratory tests will be performed to help guide treatment options. HOME CARE INSTRUCTIONS   Drink enough fluids to keep your urine clear or pale yellow.  Take all medicines as directed. Review all of your medicines with your caregiver regularly.  Avoid high-salt processed foods (canned, jarred, frozen, or boxed foods). Some examples include pickles, frozen dinners, canned soups, potato and corn chips, and olives.  Always replace fluids after exercise or after vomiting or diarrhea.  Manage underlying conditions that may cause hypernatremia. Ask your caregiver for additional support.  Follow up with your caregiver for ongoing monitoring as directed. SEEK MEDICAL CARE IF:  You have questions or concerns. MAKE SURE YOU:  Understand these instructions.  Will watch your condition.  Will get help right away if you are not doing well or get worse. Document Released: 01/25/2012 Document Reviewed: 01/25/2012 [facility] Patient Information 2015 [place]. This information is not intended to replace advice given to you by your health care provider. Make sure you discuss any questions you have with your health care provider.

## 2015-06-26 DIAGNOSIS — M25562 Pain in left knee: Secondary | ICD-10-CM | POA: Diagnosis not present

## 2015-06-26 DIAGNOSIS — M25561 Pain in right knee: Secondary | ICD-10-CM | POA: Diagnosis not present

## 2015-06-26 DIAGNOSIS — M545 Low back pain: Secondary | ICD-10-CM | POA: Diagnosis not present

## 2015-06-27 ENCOUNTER — Ambulatory Visit (INDEPENDENT_AMBULATORY_CARE_PROVIDER_SITE_OTHER): Payer: Commercial Managed Care - HMO | Admitting: Psychiatry

## 2015-06-27 ENCOUNTER — Encounter: Payer: Self-pay | Admitting: Psychiatry

## 2015-06-27 DIAGNOSIS — F320 Major depressive disorder, single episode, mild: Secondary | ICD-10-CM | POA: Diagnosis not present

## 2015-06-27 MED ORDER — SERTRALINE HCL 25 MG PO TABS: 25.0000 mg | ORAL_TABLET | Freq: Every day | ORAL | Status: DC

## 2015-06-27 MED ORDER — ALPRAZOLAM 0.25 MG PO TABS: 0.2500 mg | ORAL_TABLET | Freq: Every evening | ORAL | Status: DC | PRN

## 2015-06-27 NOTE — Progress Notes (Signed)
BH MD/PA/NP OP Progress Note  06/27/2015 2:12 PM [name]  MRN:  [id number]  Subjective:   Patient is a 74 year old female who presented for follow-up appointment. She reported that she has been taking her medications as prescribed. She reported that her sodium level was high when she went to her primary care physician and they've been trying to adjust her medications. She does not want to continue taking the mirtazapine as she has been gaining weight. 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 She stated that she wants to try again the Zoloft at a lower dose as she wants to lose weight. She appeared calm and cooperative during the interview. She currently denied having any suicidal ideations or plans. She denied having any perceptual disturbances no memory issues are noted at this time. Chief Complaint:  Chief Complaint    Follow-up; Anxiety; Depression     Visit Diagnosis:     ICD-9-CM ICD-10-CM   1. MDD (major depressive disorder), single episode, mild 296.21 F32.0     Past Medical History:  Past Medical History  Diagnosis Date  . Depression   . Anxiety   . Hypertension   . GERD (gastroesophageal reflux disease)   . Hypercholesteremia     Past Surgical History  Procedure Laterality Date  . Knee surgery Left   . Subclavian angiogram     Family History:  Family History  Problem Relation Age of Onset  . Cancer Mother     colon  . Cancer Brother     prostate  . Lymphoma Son    Social History:  Social History   Social History  . Marital Status: Married    Spouse Name: N/A  . Number of Children: N/A  . Years of Education: N/A   Social History Main Topics  . Smoking status: Never Smoker   . Smokeless tobacco: Never Used  . Alcohol Use: No  . Drug Use: No  . Sexual Activity: Not Asked   Other Topics Concern  . None   Social History Narrative   Additional History:  Currently lives with her  husband. She brought the bag  of her medications with her.  Assessment:   Musculoskeletal: Strength & Muscle Tone: within normal limits Gait & Station: normal Patient leans: N/A  Psychiatric Specialty Exam: HPI  ROS  There were no vitals taken for this visit.There is no weight on file to calculate BMI.  General Appearance: Casual and Fairly Groomed  Eye Contact:  Fair  Speech:  Clear and Coherent  Volume:  Normal  Mood:  Anxious  Affect:  Congruent  Thought Process:  Coherent, Linear and Logical  Orientation:  Full (Time, Place, and Person)  Thought Content:  WDL  Suicidal Thoughts:  No  Homicidal Thoughts:  No  Memory:  NA  Judgement:  Fair  Insight:  Fair  Psychomotor Activity:  Increased  Concentration:  Fair  Recall:  [employer] of Knowledge: Fair  Language: Fair  Akathisia:  No  Handed:  Right  AIMS (if indicated):    Assets:  Communication Skills Desire for Improvement Social Support  ADL's:  Intact  Cognition: WNL  Sleep:     Is the patient at risk to self?  No. Has the patient been a risk to self in the past 6 months?  No. Has the patient been a risk to self within the distant past?  No. Is the patient a risk to  others?  No. Has the patient been a risk to others in the past 6 months?  No. Has the patient been a risk to others within the distant past?  No.  Current Medications: Current Outpatient Prescriptions  Medication Sig Dispense Refill  . aspirin 81 MG tablet Take 1 tablet by mouth daily.    Marland Kitchen atorvastatin (LIPITOR) 20 MG tablet Take 1 tablet (20 mg total) by mouth at bedtime. 90 tablet 0  . LUMIGAN 0.01 % SOLN Place 1 drop into both eyes at bedtime.  1  . mirtazapine (REMERON) 15 MG tablet TAKE 1 TABLET AT BEDTIME 30 tablet 1  . Naproxen Sodium (ALEVE) 220 MG CAPS Take 220 mg by mouth as needed.    Marland Kitchen omeprazole (PRILOSEC) 20 MG capsule Take 20 mg by mouth 2 (two) times daily.  5  . Polyethyl Glycol-Propyl Glycol (SYSTANE OP) Apply to eye.    Marland Kitchen  ALPRAZolam (XANAX) 0.25 MG tablet Take by mouth.    . COMBIGAN 0.2-0.5 % ophthalmic solution INSTILL 1 DROP IN BOTH EYES TWICE A DAY  3  . diazepam (VALIUM) 5 MG tablet     . Light Mineral Oil-Mineral Oil (RETAINE MGD OP) Apply to eye.    . losartan (COZAAR) 50 MG tablet Take 50 mg by mouth daily.  5  . meloxicam (MOBIC) 7.5 MG tablet      No current facility-administered medications for this visit.    Medical Decision Making:  Review of Psycho-Social Stressors (1), Discuss test with performing physician (1) and Review of Last Therapy Session (1)  Treatment Plan Summary:Medication management  I will discontinue the mirtazapine and start her on Zoloft 25 mg by mouth daily. She takes alprazolam on a when necessary basis. She was given the prescription again. Follow-up in one month.   More than 50% of the time spent in psychoeducation, counseling and coordination of care.    This note was generated in part or whole with voice recognition software. Voice regonition is usually quite accurate but there are transcription errors that can and very often do occur. I apologize for any typographical errors that were not detected and corrected.    Rainey Pines 06/27/2015, 2:12 PM

## 2015-06-28 ENCOUNTER — Other Ambulatory Visit: Payer: Self-pay | Admitting: Psychiatry

## 2015-07-02 ENCOUNTER — Other Ambulatory Visit: Payer: Self-pay

## 2015-07-03 ENCOUNTER — Telehealth: Payer: Self-pay | Admitting: Family Medicine

## 2015-07-03 MED ORDER — OMEPRAZOLE 20 MG PO CPDR: 20.0000 mg | DELAYED_RELEASE_CAPSULE | Freq: Two times a day (BID) | ORAL | Status: DC

## 2015-07-03 NOTE — Telephone Encounter (Signed)
Medication has been refilled and sent to [facility]

## 2015-07-03 NOTE — Telephone Encounter (Signed)
[facility] CALLING SAYING THAT THERE IS A PROBLEM WITH OMEPRAZOLE. THEY ARE NEEDING A REILL REQUEST OKAYED BY DR

## 2015-07-05 ENCOUNTER — Other Ambulatory Visit: Payer: Self-pay | Admitting: Orthopedic Surgery

## 2015-07-05 DIAGNOSIS — E870 Hyperosmolality and hypernatremia: Secondary | ICD-10-CM | POA: Diagnosis not present

## 2015-07-05 DIAGNOSIS — M545 Low back pain: Secondary | ICD-10-CM

## 2015-07-06 LAB — BASIC METABOLIC PANEL
BUN/Creatinine Ratio: 13 (ref 11–26)
BUN: 12 mg/dL (ref 8–27)
CO2: 24 mmol/L (ref 18–29)
Calcium: 9.3 mg/dL (ref 8.7–10.3)
Chloride: 105 mmol/L (ref 97–108)
Creatinine, Ser: 0.9 mg/dL (ref 0.57–1.00)
GFR calc Af Amer: 73 mL/min/{1.73_m2} (ref >59)
GFR calc non Af Amer: 63 mL/min/{1.73_m2} (ref >59)
Glucose: 98 mg/dL (ref 65–99)
Potassium: 4.3 mmol/L (ref 3.5–5.2)
Sodium: 143 mmol/L (ref 134–144)

## 2015-07-09 ENCOUNTER — Other Ambulatory Visit: Payer: Self-pay

## 2015-07-10 ENCOUNTER — Telehealth: Payer: Self-pay | Admitting: Family Medicine

## 2015-07-10 NOTE — Telephone Encounter (Signed)
Checking status on lab results [phone]

## 2015-07-12 NOTE — Telephone Encounter (Signed)
Spoke to patient and results have been reported.

## 2015-07-13 ENCOUNTER — Ambulatory Visit
Admission: RE | Admit: 2015-07-13 | Discharge: 2015-07-13 | Disposition: A | Discharge status: Home / Self Care | Payer: Commercial Managed Care - HMO | Source: Ambulatory Visit | Attending: Orthopedic Surgery | Admitting: Orthopedic Surgery

## 2015-07-13 DIAGNOSIS — M4806 Spinal stenosis, lumbar region: Secondary | ICD-10-CM | POA: Diagnosis not present

## 2015-07-13 DIAGNOSIS — M545 Low back pain: Secondary | ICD-10-CM

## 2015-07-15 NOTE — Progress Notes (Signed)
This has been discontinued 

## 2015-07-17 DIAGNOSIS — M545 Low back pain: Secondary | ICD-10-CM | POA: Diagnosis not present

## 2015-07-19 ENCOUNTER — Other Ambulatory Visit: Payer: Self-pay

## 2015-07-19 NOTE — Telephone Encounter (Signed)
pt was r/s from 07-25-15 due to dr. Gretel Acre work restrictions to 08-22-15 pt states she will need all of medications refill.

## 2015-07-23 MED ORDER — SERTRALINE HCL 25 MG PO TABS: 25.0000 mg | ORAL_TABLET | Freq: Every day | ORAL | Status: DC

## 2015-07-25 ENCOUNTER — Ambulatory Visit: Payer: Self-pay | Admitting: Psychiatry

## 2015-07-29 ENCOUNTER — Ambulatory Visit: Payer: Self-pay | Admitting: Family Medicine

## 2015-07-31 ENCOUNTER — Ambulatory Visit: Payer: Self-pay | Admitting: Family Medicine

## 2015-08-21 ENCOUNTER — Telehealth: Payer: Self-pay | Admitting: Family Medicine

## 2015-08-21 NOTE — Telephone Encounter (Signed)
Pt needs a referral to Dr Rainey Pines at [facility]. Pt has an appt tomorrow on 08/22/2015.

## 2015-08-21 NOTE — Telephone Encounter (Signed)
Called pt and notified her that I have obtained [facility] authorization for her to see Dr. Rainey Pines  Auth # [id number]   Start - 08/21/2015 Expires - 02/17/2016 ICD-10: Z22.4 & F41.9

## 2015-08-22 ENCOUNTER — Encounter: Payer: Self-pay | Admitting: Psychiatry

## 2015-08-22 ENCOUNTER — Ambulatory Visit (INDEPENDENT_AMBULATORY_CARE_PROVIDER_SITE_OTHER): Payer: Commercial Managed Care - HMO | Admitting: Psychiatry

## 2015-08-22 ENCOUNTER — Other Ambulatory Visit: Payer: Self-pay | Admitting: Psychiatry

## 2015-08-22 VITALS — BP 122/86 | HR 72 | Temp 98.2°F | Ht 62.5 in | Wt 160.2 lb

## 2015-08-22 DIAGNOSIS — F331 Major depressive disorder, recurrent, moderate: Secondary | ICD-10-CM

## 2015-08-22 MED ORDER — ALPRAZOLAM 0.25 MG PO TABS: 0.2500 mg | ORAL_TABLET | Freq: Every evening | ORAL | Status: DC | PRN

## 2015-08-22 MED ORDER — SERTRALINE HCL 25 MG PO TABS: 25.0000 mg | ORAL_TABLET | Freq: Every day | ORAL | Status: DC

## 2015-08-22 NOTE — Progress Notes (Signed)
BH MD/PA/NP OP Progress Note  08/22/2015 3:35 PM [name]  MRN:  [id number]  Subjective:   Patient is a 74 year old female who presented for follow-up appointment. She reported that she has been taking her medications as prescribed. She was started on Zoloft at her last visit and she reported that she is currently stable on the medication. Patient reported that she takes Xanax on a when necessary basis at bedtime to help her with sleep. Patient reported that she is happy as she has been losing weight. She has lost couple of pounds. However she is drinking coffee at night which is interrupting her sleep. She stated that she takes Xanax to help her sleep at night. 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 She is enjoying her job. She reported that October is a difficult month for her due to the death anniversaries of her husband and her son. She stated that she is handling it well right now. She currently denied having any suicidal homicidal ideations or plans.      Chief Complaint:  Chief Complaint    Follow-up; Medication Refill     Visit Diagnosis:     ICD-9-CM ICD-10-CM   1. MDD (major depressive disorder), recurrent episode, moderate ([place]) 296.32 F33.1     Past Medical History:  Past Medical History  Diagnosis Date  . Depression   . Anxiety   . Hypertension   . GERD (gastroesophageal reflux disease)   . Hypercholesteremia     Past Surgical History  Procedure Laterality Date  . Knee surgery Left   . Subclavian angiogram     Family History:  Family History  Problem Relation Age of Onset  . Cancer Mother     colon  . Alzheimer's disease Mother   . Hypertension Mother   . Cancer Brother     prostate  . Lymphoma Son   . Hypertension Father   . Cerebral aneurysm Father   . Diabetes Sister   . Depression Sister   . Hypertension Sister   . Depression Sister   . Cancer Brother   . Gallbladder disease Brother   . Prostate cancer Brother     Social History:  Social History   Social History  . Marital Status: Married    Spouse Name: N/A  . Number of Children: N/A  . Years of Education: N/A   Social History Main Topics  . Smoking status: Never Smoker   . Smokeless tobacco: Never Used  . Alcohol Use: No  . Drug Use: No  . Sexual Activity: Not Currently   Other Topics Concern  . None   Social History Narrative   Additional History:  . She brought the bag  of her medications with her.  Assessment:   Musculoskeletal: Strength & Muscle Tone: within normal limits Gait & Station: normal Patient leans: N/A  Psychiatric Specialty Exam: HPI   ROS   Blood pressure 122/86, pulse 72, temperature 98.2 F (36.8 C), temperature source Tympanic, height 5' 2.5" (1.588 m), weight 160 lb 3.2 oz (72.666 kg), SpO2 97 %.Body mass index is 28.82 kg/(m^2).  General Appearance: Casual and Fairly Groomed  Eye Contact:  Fair  Speech:  Clear and Coherent  Volume:  Normal  Mood:  Anxious  Affect:  Congruent  Thought Process:  Coherent, Linear and Logical  Orientation:  Full (Time, Place, and Person)  Thought Content:  WDL  Suicidal Thoughts:  No  Homicidal Thoughts:  No  Memory:  NA  Judgement:  Fair  Insight:  Fair  Psychomotor Activity:  Increased  Concentration:  Fair  Recall:  [employer] of Knowledge: Fair  Language: Fair  Akathisia:  No  Handed:  Right  AIMS (if indicated):    Assets:  Communication Skills Desire for Improvement Social Support  ADL's:  Intact  Cognition: WNL  Sleep:     Is the patient at risk to self?  No. Has the patient been a risk to self in the past 6 months?  No. Has the patient been a risk to self within the distant past?  No. Is the patient a risk to others?  No. Has the patient been a risk to others in the past 6 months?  No. Has the patient been a risk to others within the distant past?  No.  Current Medications: Current Outpatient Prescriptions  Medication Sig Dispense  Refill  . ALPRAZolam (XANAX) 0.25 MG tablet Take 1 tablet (0.25 mg total) by mouth at bedtime as needed for anxiety. 30 tablet 1  . aspirin 81 MG tablet Take 1 tablet by mouth daily.    Marland Kitchen atorvastatin (LIPITOR) 20 MG tablet Take 1 tablet (20 mg total) by mouth at bedtime. 90 tablet 0  . COMBIGAN 0.2-0.5 % ophthalmic solution INSTILL 1 DROP IN BOTH EYES TWICE A DAY  3  . Light Mineral Oil-Mineral Oil (RETAINE MGD OP) Apply to eye.    . losartan (COZAAR) 50 MG tablet Take 50 mg by mouth daily.  5  . LUMIGAN 0.01 % SOLN Place 1 drop into both eyes at bedtime.  1  . meloxicam (MOBIC) 7.5 MG tablet     . Naproxen Sodium (ALEVE) 220 MG CAPS Take 220 mg by mouth as needed.    Marland Kitchen omeprazole (PRILOSEC) 20 MG capsule Take 1 capsule (20 mg total) by mouth 2 (two) times daily. 60 capsule 5  . sertraline (ZOLOFT) 25 MG tablet Take 1 tablet (25 mg total) by mouth daily. 30 tablet 1  . sertraline (ZOLOFT) 25 MG tablet TAKE 1 TABLET BY MOUTH DAILY. 30 tablet 1  . diazepam (VALIUM) 5 MG tablet     . mirtazapine (REMERON) 15 MG tablet     . Polyethyl Glycol-Propyl Glycol (SYSTANE OP) Apply to eye.     No current facility-administered medications for this visit.    Medical Decision Making:  Review of Psycho-Social Stressors (1), Discuss test with performing physician (1) and Review of Last Therapy Session (1)  Treatment Plan Summary:Medication management   Depression Continue Zoloft 25 mg by mouth daily. She takes alprazolam on a when necessary basis. She was given the prescription again.   Follow-up in 2 months or earlier depending on the symptoms   More than 50% of the time spent in psychoeducation, counseling and coordination of care.  Time spent with the patient 25 minutes   This note was generated in part or whole with voice recognition software. Voice regonition is usually quite accurate but there are transcription errors that can and very often do occur. I apologize for any typographical errors  that were not detected and corrected.    Rainey Pines 08/22/2015, 3:35 PM

## 2015-09-05 ENCOUNTER — Encounter: Payer: Self-pay | Admitting: Family Medicine

## 2015-09-05 ENCOUNTER — Ambulatory Visit (INDEPENDENT_AMBULATORY_CARE_PROVIDER_SITE_OTHER): Payer: Commercial Managed Care - HMO | Admitting: Family Medicine

## 2015-09-05 VITALS — BP 118/84 | HR 78 | Temp 97.7°F | Resp 16 | Wt 158.9 lb

## 2015-09-05 DIAGNOSIS — I10 Essential (primary) hypertension: Secondary | ICD-10-CM | POA: Diagnosis not present

## 2015-09-05 DIAGNOSIS — E785 Hyperlipidemia, unspecified: Secondary | ICD-10-CM | POA: Diagnosis not present

## 2015-09-05 DIAGNOSIS — F329 Major depressive disorder, single episode, unspecified: Secondary | ICD-10-CM | POA: Diagnosis not present

## 2015-09-05 DIAGNOSIS — F32A Depression, unspecified: Secondary | ICD-10-CM | POA: Insufficient documentation

## 2015-09-05 MED ORDER — ATORVASTATIN CALCIUM 20 MG PO TABS: 20.0000 mg | ORAL_TABLET | Freq: Every day | ORAL | Status: DC

## 2015-09-05 NOTE — Progress Notes (Signed)
Name: [name]   MRN: [id number]    DOB: Jun 21, 1941   Date:09/05/2015       Progress Note  Subjective  Chief Complaint  Chief Complaint  Patient presents with  . Follow-up    patient is here for her 3 month follow-up from her 06/20/15 visit. patient had abnormal labs regarding her sodium levels. she has had some bilateral leg weakness and pain.     Hyperlipidemia The problem is controlled. Recent lipid tests were reviewed and are high. Pertinent negatives include no chest pain, leg pain or shortness of breath. Current antihyperlipidemic treatment includes statins.  Hypertension The problem is controlled. Pertinent negatives include no chest pain, headaches, palpitations or shortness of breath. Past treatments include angiotensin blockers.   Past Medical History  Diagnosis Date  . Depression   . Anxiety   . Hypertension   . GERD (gastroesophageal reflux disease)   . Hypercholesteremia     Past Surgical History  Procedure Laterality Date  . Knee surgery Left   . Subclavian angiogram      Family History  Problem Relation Age of Onset  . Cancer Mother     colon  . Alzheimer's disease Mother   . Hypertension Mother   . Cancer Brother     prostate  . Lymphoma Son   . Hypertension Father   . Cerebral aneurysm Father   . Diabetes Sister   . Depression Sister   . Hypertension Sister   . Depression Sister   . Cancer Brother   . Gallbladder disease Brother   . Prostate cancer Brother     Social History   Social History  . Marital Status: Married    Spouse Name: N/A  . Number of Children: N/A  . Years of Education: N/A   Occupational History  . Not on file.   Social History Main Topics  . Smoking status: Never Smoker   . Smokeless tobacco: Never Used  . Alcohol Use: No  . Drug Use: No  . Sexual Activity: Not Currently   Other Topics Concern  . Not on file   Social History Narrative     Current outpatient prescriptions:  .  ALPRAZolam (XANAX)  0.25 MG tablet, Take 1 tablet (0.25 mg total) by mouth at bedtime as needed for anxiety., Disp: 30 tablet, Rfl: 1 .  aspirin 81 MG tablet, Take 1 tablet by mouth daily., Disp: , Rfl:  .  atorvastatin (LIPITOR) 20 MG tablet, Take 1 tablet (20 mg total) by mouth at bedtime., Disp: 90 tablet, Rfl: 0 .  COMBIGAN 0.2-0.5 % ophthalmic solution, INSTILL 1 DROP IN BOTH EYES TWICE A DAY, Disp: , Rfl: 3 .  Light Mineral Oil-Mineral Oil (RETAINE MGD OP), Apply to eye., Disp: , Rfl:  .  losartan (COZAAR) 50 MG tablet, Take 50 mg by mouth daily., Disp: , Rfl: 5 .  LUMIGAN 0.01 % SOLN, Place 1 drop into both eyes at bedtime., Disp: , Rfl: 1 .  meloxicam (MOBIC) 7.5 MG tablet, , Disp: , Rfl:  .  omeprazole (PRILOSEC) 20 MG capsule, Take 1 capsule (20 mg total) by mouth 2 (two) times daily., Disp: 60 capsule, Rfl: 5 .  sertraline (ZOLOFT) 25 MG tablet, TAKE 1 TABLET BY MOUTH DAILY., Disp: 30 tablet, Rfl: 1 .  Naproxen Sodium (ALEVE) 220 MG CAPS, Take 220 mg by mouth as needed., Disp: , Rfl:  .  Polyethyl Glycol-Propyl Glycol (SYSTANE OP), Apply to eye., Disp: , Rfl:   Allergies  Allergen  Reactions  . Codeine   . Sulfa Antibiotics    Review of Systems  Respiratory: Negative for shortness of breath.   Cardiovascular: Negative for chest pain and palpitations.  Neurological: Negative for headaches.    Objective  Filed Vitals:   09/05/15 1630  BP: 118/84  Pulse: 78  Temp: 97.7 F (36.5 C)  TempSrc: Oral  Resp: 16  Weight: 158 lb 14.4 oz (72.077 kg)  SpO2: 96%    Physical Exam  Constitutional: She is oriented to person, place, and time and well-developed, well-nourished, and in no distress.  Cardiovascular: Normal rate and regular rhythm.   Pulmonary/Chest: Effort normal and breath sounds normal. She has no wheezes.  Abdominal: Soft. Bowel sounds are normal. She exhibits no distension. There is no tenderness.  Neurological: She is alert and oriented to person, place, and time.  Nursing note and  vitals reviewed.   Assessment & Plan  1. Dyslipidemia  - Lipid Profile - Comprehensive Metabolic Panel (CMET) - atorvastatin (LIPITOR) 20 MG tablet; Take 1 tablet (20 mg total) by mouth at bedtime.  Dispense: 90 tablet; Refill: 0  2. Benign hypertension Blood pressure is stable and controlled on present therapy.  3. Depression patient is requesting [place] to take over the management of depression and anxiety. She is currently seeing Dr. Gretel Acre (psychiatry). Will follow up in 2 months.   Alyna Stensland [facility] 09/05/2015 5:09 PM

## 2015-09-13 DIAGNOSIS — E785 Hyperlipidemia, unspecified: Secondary | ICD-10-CM | POA: Diagnosis not present

## 2015-09-14 LAB — LIPID PANEL
Chol/HDL Ratio: 2.4 ratio units (ref 0.0–4.4)
Cholesterol, Total: 124 mg/dL (ref 100–199)
HDL: 52 mg/dL (ref >39)
LDL Calculated: 51 mg/dL (ref 0–99)
Triglycerides: 106 mg/dL (ref 0–149)
VLDL Cholesterol Cal: 21 mg/dL (ref 5–40)

## 2015-09-14 LAB — COMPREHENSIVE METABOLIC PANEL
ALT: 12 IU/L (ref 0–32)
AST: 15 IU/L (ref 0–40)
Albumin/Globulin Ratio: 1.8 (ref 1.1–2.5)
Albumin: 4.2 g/dL (ref 3.5–4.8)
Alkaline Phosphatase: 69 IU/L (ref 39–117)
BUN/Creatinine Ratio: 21 (ref 11–26)
BUN: 15 mg/dL (ref 8–27)
Bilirubin Total: 0.6 mg/dL (ref 0.0–1.2)
CO2: 25 mmol/L (ref 18–29)
Calcium: 9.2 mg/dL (ref 8.7–10.3)
Chloride: 106 mmol/L (ref 97–106)
Creatinine, Ser: 0.71 mg/dL (ref 0.57–1.00)
GFR calc Af Amer: 97 mL/min/{1.73_m2} (ref >59)
GFR calc non Af Amer: 84 mL/min/{1.73_m2} (ref >59)
Globulin, Total: 2.3 g/dL (ref 1.5–4.5)
Glucose: 95 mg/dL (ref 65–99)
Potassium: 4.5 mmol/L (ref 3.5–5.2)
Sodium: 146 mmol/L — ABNORMAL HIGH (ref 136–144)
Total Protein: 6.5 g/dL (ref 6.0–8.5)

## 2015-09-17 DIAGNOSIS — Z124 Encounter for screening for malignant neoplasm of cervix: Secondary | ICD-10-CM | POA: Diagnosis not present

## 2015-09-17 DIAGNOSIS — Z6828 Body mass index (BMI) 28.0-28.9, adult: Secondary | ICD-10-CM | POA: Diagnosis not present

## 2015-09-18 ENCOUNTER — Telehealth: Payer: Self-pay | Admitting: Emergency Medicine

## 2015-09-18 NOTE — Telephone Encounter (Signed)
Patient was notified of lab results. Stated that she saw the Orthopedic for her legs and he stated the the Atorvastatin can cause leg pains and cramps. Did you still want her to stop medication for a while and restart to see if this was giving her the problem

## 2015-10-08 DIAGNOSIS — H401132 Primary open-angle glaucoma, bilateral, moderate stage: Secondary | ICD-10-CM | POA: Diagnosis not present

## 2015-10-08 DIAGNOSIS — Z961 Presence of intraocular lens: Secondary | ICD-10-CM | POA: Diagnosis not present

## 2015-10-08 DIAGNOSIS — H04123 Dry eye syndrome of bilateral lacrimal glands: Secondary | ICD-10-CM | POA: Diagnosis not present

## 2015-10-17 NOTE — Progress Notes (Signed)
rx refilled on  08-22-15

## 2015-10-20 ENCOUNTER — Other Ambulatory Visit: Payer: Self-pay | Admitting: Psychiatry

## 2015-10-22 ENCOUNTER — Ambulatory Visit: Payer: Commercial Managed Care - HMO | Admitting: Psychiatry

## 2015-10-29 ENCOUNTER — Ambulatory Visit (INDEPENDENT_AMBULATORY_CARE_PROVIDER_SITE_OTHER): Payer: Medicare HMO | Admitting: Psychiatry

## 2015-10-29 ENCOUNTER — Encounter: Payer: Self-pay | Admitting: Psychiatry

## 2015-10-29 VITALS — BP 124/86 | HR 72 | Temp 98.6°F | Ht 62.5 in | Wt 157.4 lb

## 2015-10-29 DIAGNOSIS — F330 Major depressive disorder, recurrent, mild: Secondary | ICD-10-CM | POA: Diagnosis not present

## 2015-10-29 MED ORDER — SERTRALINE HCL 25 MG PO TABS: 25.0000 mg | ORAL_TABLET | Freq: Every day | ORAL | Status: DC

## 2015-10-29 MED ORDER — ALPRAZOLAM 0.25 MG PO TABS: 0.2500 mg | ORAL_TABLET | Freq: Every evening | ORAL | Status: DC | PRN

## 2015-10-29 NOTE — Progress Notes (Signed)
BH MD/PA/NP OP Progress Note  10/29/2015 3:24 PM [name]  MRN:  [id number]  Subjective:   Patient is a 74 year old female who presented for follow-up appointment. She reported that she has been taking her medications as prescribed. 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 She is planning to spend some time with her son in [place] as well as with her sister. Patient reported that she'll be going to [place]  to visit her sister during the holidays. She reported that she did not fill her last prescription of Xanax as she forgot about it and has been utilizing the previous medications. She takes it on a when necessary basis. She continues to sleep well. She reported that she is doing well on the Zoloft 25 mg and does not have any side effects of the medications. She reported that her memory is good. She currently denied having any suicidal ideations or plans. She denied having any perceptual disturbances. She has good relationship with her family members     Chief Complaint:  Chief Complaint    Follow-up; Medication Refill     Visit Diagnosis:     ICD-9-CM ICD-10-CM   1. MDD (major depressive disorder), recurrent episode, mild ([facility]) 296.31 F33.0     Past Medical History:  Past Medical History  Diagnosis Date  . Depression   . Anxiety   . Hypertension   . GERD (gastroesophageal reflux disease)   . Hypercholesteremia     Past Surgical History  Procedure Laterality Date  . Knee surgery Left   . Subclavian angiogram     Family History:  Family History  Problem Relation Age of Onset  . Cancer Mother     colon  . Alzheimer's disease Mother   . Hypertension Mother   . Cancer Brother     prostate  . Lymphoma Son   . Hypertension Father   . Cerebral aneurysm Father   . Diabetes Sister   . Depression Sister   . Hypertension Sister   . Depression Sister   . Cancer Brother   . Gallbladder  disease Brother   . Prostate cancer Brother    Social History:  Social History   Social History  . Marital Status: Married    Spouse Name: N/A  . Number of Children: N/A  . Years of Education: N/A   Social History Main Topics  . Smoking status: Never Smoker   . Smokeless tobacco: Never Used  . Alcohol Use: No  . Drug Use: No  . Sexual Activity: Not Currently   Other Topics Concern  . None   Social History Narrative   Additional History:  . She brought the bag  of her medications with her.  Assessment:   Musculoskeletal: Strength & Muscle Tone: within normal limits Gait & Station: normal Patient leans: N/A  Psychiatric Specialty Exam: HPI   ROS   Blood pressure 124/86, pulse 72, temperature 98.6 F (37 C), temperature source Tympanic, height 5' 2.5" (1.588 m), weight 157 lb 6.4 oz (71.396 kg), SpO2 93 %.Body mass index is 28.31 kg/(m^2).  General Appearance: Casual and Fairly Groomed  Eye Contact:  Fair  Speech:  Clear and Coherent  Volume:  Normal  Mood:  Anxious  Affect:  Congruent  Thought Process:  Coherent, Linear and Logical  Orientation:  Full (Time, Place, and Person)  Thought Content:  WDL  Suicidal Thoughts:  No  Homicidal Thoughts:  No  Memory:  NA  Judgement:  Fair  Insight:  Fair  Psychomotor Activity:  Increased  Concentration:  Fair  Recall:  [employer] of Knowledge: Fair  Language: Fair  Akathisia:  No  Handed:  Right  AIMS (if indicated):    Assets:  Communication Skills Desire for Improvement Social Support  ADL's:  Intact  Cognition: WNL  Sleep:     Is the patient at risk to self?  No. Has the patient been a risk to self in the past 6 months?  No. Has the patient been a risk to self within the distant past?  No. Is the patient a risk to others?  No. Has the patient been a risk to others in the past 6 months?  No. Has the patient been a risk to others within the distant past?  No.  Current Medications: Current Outpatient  Prescriptions  Medication Sig Dispense Refill  . ALPRAZolam (XANAX) 0.25 MG tablet Take 1 tablet (0.25 mg total) by mouth at bedtime as needed for anxiety. 30 tablet 1  . aspirin 81 MG tablet Take 1 tablet by mouth daily.    Marland Kitchen atorvastatin (LIPITOR) 20 MG tablet Take 1 tablet (20 mg total) by mouth at bedtime. 90 tablet 0  . COMBIGAN 0.2-0.5 % ophthalmic solution INSTILL 1 DROP IN BOTH EYES TWICE A DAY  3  . Light Mineral Oil-Mineral Oil (RETAINE MGD OP) Apply to eye.    . losartan (COZAAR) 50 MG tablet Take 50 mg by mouth daily.  5  . LUMIGAN 0.01 % SOLN Place 1 drop into both eyes at bedtime.  1  . meloxicam (MOBIC) 7.5 MG tablet     . Naproxen Sodium (ALEVE) 220 MG CAPS Take 220 mg by mouth as needed.    Marland Kitchen omeprazole (PRILOSEC) 20 MG capsule Take 1 capsule (20 mg total) by mouth 2 (two) times daily. 60 capsule 5  . Polyethyl Glycol-Propyl Glycol (SYSTANE OP) Apply to eye.    . sertraline (ZOLOFT) 25 MG tablet TAKE 1 TABLET BY MOUTH DAILY. 30 tablet 1   No current facility-administered medications for this visit.    Medical Decision Making:  Review of Psycho-Social Stressors (1), Discuss test with performing physician (1) and Review of Last Therapy Session (1)  Treatment Plan Summary:Medication management   Depression Continue Zoloft 25 mg by mouth daily. She takes alprazolam on a when necessary basis. She was given the prescription again.   Follow-up in  3 months or earlier depending on the symptoms   More than 50% of the time spent in psychoeducation, counseling and coordination of care.  Time spent with the patient 25 minutes   This note was generated in part or whole with voice recognition software. Voice regonition is usually quite accurate but there are transcription errors that can and very often do occur. I apologize for any typographical errors that were not detected and corrected.    Rainey Pines 10/29/2015, 3:24 PM

## 2015-11-20 DIAGNOSIS — Z1231 Encounter for screening mammogram for malignant neoplasm of breast: Secondary | ICD-10-CM | POA: Diagnosis not present

## 2015-11-29 DIAGNOSIS — R928 Other abnormal and inconclusive findings on diagnostic imaging of breast: Secondary | ICD-10-CM | POA: Diagnosis not present

## 2015-12-12 ENCOUNTER — Encounter: Payer: Self-pay | Admitting: Family Medicine

## 2015-12-23 ENCOUNTER — Other Ambulatory Visit: Payer: Self-pay

## 2015-12-23 ENCOUNTER — Ambulatory Visit: Payer: Self-pay | Admitting: Family Medicine

## 2015-12-23 MED ORDER — SERTRALINE HCL 25 MG PO TABS: 25.0000 mg | ORAL_TABLET | Freq: Every day | ORAL | Status: DC

## 2015-12-23 NOTE — Telephone Encounter (Signed)
received fax requesting a 90 day supply  for sertraline HCL  25mg  take 1 tablet by mouth daily. pt last seen on 10-29-15 next appt  01-28-16

## 2015-12-25 ENCOUNTER — Ambulatory Visit (INDEPENDENT_AMBULATORY_CARE_PROVIDER_SITE_OTHER): Payer: Commercial Managed Care - HMO | Admitting: Family Medicine

## 2015-12-25 ENCOUNTER — Encounter: Payer: Self-pay | Admitting: Family Medicine

## 2015-12-25 VITALS — BP 128/78 | HR 88 | Temp 98.4°F | Resp 18 | Ht 63.0 in | Wt 157.2 lb

## 2015-12-25 DIAGNOSIS — E785 Hyperlipidemia, unspecified: Secondary | ICD-10-CM

## 2015-12-25 DIAGNOSIS — I10 Essential (primary) hypertension: Secondary | ICD-10-CM | POA: Diagnosis not present

## 2015-12-25 DIAGNOSIS — Z23 Encounter for immunization: Secondary | ICD-10-CM

## 2015-12-25 DIAGNOSIS — M1711 Unilateral primary osteoarthritis, right knee: Secondary | ICD-10-CM | POA: Diagnosis not present

## 2015-12-25 MED ORDER — LOSARTAN POTASSIUM 50 MG PO TABS: 50.0000 mg | ORAL_TABLET | Freq: Every day | ORAL | Status: DC

## 2015-12-25 MED ORDER — MELOXICAM 7.5 MG PO TABS: 7.5000 mg | ORAL_TABLET | Freq: Every day | ORAL | Status: DC

## 2015-12-25 NOTE — Progress Notes (Signed)
Name: [name]   MRN: [id number]    DOB: 26-Jan-1941   Date:12/25/2015       Progress Note  Subjective  Chief Complaint  Chief Complaint  Patient presents with  . Follow-up    Check legs / Discuss medicaiton    Hyperlipidemia This is a chronic problem. The problem is controlled. Pertinent negatives include no chest pain, myalgias or shortness of breath. She is currently on no antihyperlipidemic treatment (Stopped taking Lipitor due to myalgias and arthralgias.). Compliance problems include medication side effects.   Hypertension This is a chronic problem. The problem is unchanged. The problem is controlled. Pertinent negatives include no blurred vision, chest pain, headaches, palpitations or shortness of breath. Past treatments include angiotensin blockers. There is no history of kidney disease, CAD/MI or CVA.    R. Knee Arthritis Pt. Presents for follow up of R. Knee osteoarthritis. She has been followed by Orthopedics, and was diagnosed with arthrits. Was started on Meloxicam 7.5mg  daily, which helps relieve the pain. She is also going to follow up with Orthopedics for an injection in her knee.   Past Medical History  Diagnosis Date  . Depression   . Anxiety   . Hypertension   . GERD (gastroesophageal reflux disease)   . Hypercholesteremia     Past Surgical History  Procedure Laterality Date  . Knee surgery Left   . Subclavian angiogram      Family History  Problem Relation Age of Onset  . Cancer Mother     colon  . Alzheimer's disease Mother   . Hypertension Mother   . Cancer Brother     prostate  . Lymphoma Son   . Hypertension Father   . Cerebral aneurysm Father   . Diabetes Sister   . Depression Sister   . Hypertension Sister   . Depression Sister   . Cancer Brother   . Gallbladder disease Brother   . Prostate cancer Brother     Social History   Social History  . Marital Status: Married    Spouse Name: N/A  . Number of Children: N/A  .  Years of Education: N/A   Occupational History  . Not on file.   Social History Main Topics  . Smoking status: Never Smoker   . Smokeless tobacco: Never Used  . Alcohol Use: No  . Drug Use: No  . Sexual Activity: Not Currently   Other Topics Concern  . Not on file   Social History Narrative     Current outpatient prescriptions:  .  ALPRAZolam (XANAX) 0.25 MG tablet, Take 1 tablet (0.25 mg total) by mouth at bedtime as needed for anxiety., Disp: 30 tablet, Rfl: 1 .  aspirin 81 MG tablet, Take 1 tablet by mouth daily., Disp: , Rfl:  .  atorvastatin (LIPITOR) 20 MG tablet, Take 1 tablet (20 mg total) by mouth at bedtime., Disp: 90 tablet, Rfl: 0 .  COMBIGAN 0.2-0.5 % ophthalmic solution, INSTILL 1 DROP IN BOTH EYES TWICE A DAY, Disp: , Rfl: 3 .  Light Mineral Oil-Mineral Oil (RETAINE MGD OP), Apply to eye., Disp: , Rfl:  .  losartan (COZAAR) 50 MG tablet, Take 50 mg by mouth daily., Disp: , Rfl: 5 .  LUMIGAN 0.01 % SOLN, Place 1 drop into both eyes at bedtime., Disp: , Rfl: 1 .  meloxicam (MOBIC) 7.5 MG tablet, , Disp: , Rfl:  .  Naproxen Sodium (ALEVE) 220 MG CAPS, Take 220 mg by mouth as needed., Disp: , Rfl:  .  omeprazole (PRILOSEC) 20 MG capsule, Take 1 capsule (20 mg total) by mouth 2 (two) times daily., Disp: 60 capsule, Rfl: 5 .  Polyethyl Glycol-Propyl Glycol (SYSTANE OP), Apply to eye., Disp: , Rfl:  .  sertraline (ZOLOFT) 25 MG tablet, Take 1 tablet (25 mg total) by mouth daily., Disp: 90 tablet, Rfl: 0  Allergies  Allergen Reactions  . Codeine   . Sulfa Antibiotics      Review of Systems  Constitutional: Negative for fever and chills.  Eyes: Negative for blurred vision.  Respiratory: Negative for shortness of breath.   Cardiovascular: Negative for chest pain and palpitations.  Gastrointestinal: Negative for abdominal pain.  Musculoskeletal: Positive for joint pain. Negative for myalgias.  Skin: Negative for rash.  Neurological: Negative for headaches.      Objective  Filed Vitals:   12/25/15 1604  BP: 128/78  Pulse: 88  Temp: 98.4 F (36.9 C)  TempSrc: Oral  Resp: 18  Height: 5\' 3"  (1.6 m)  Weight: 157 lb 3.2 oz (71.305 kg)  SpO2: 95%    Physical Exam  Constitutional: She is oriented to person, place, and time and well-developed, well-nourished, and in no distress.  HENT:  Head: Normocephalic and atraumatic.  Cardiovascular: Normal rate and regular rhythm.   Pulmonary/Chest: Effort normal and breath sounds normal.  Abdominal: Soft. Bowel sounds are normal.  Musculoskeletal:       Right knee: She exhibits normal range of motion and no swelling.  Crepitus in right knee.  Neurological: She is alert and oriented to person, place, and time.  Nursing note and vitals reviewed.    Assessment & Plan  1. Need for immunization against influenza  - Flu vaccine HIGH DOSE PF (Fluzone High dose)  2. HLD (hyperlipidemia) Has discontinued atorvastatin secondary to muscle aches and fatigue. Obtain FLP and consider starting on Crestor - Lipid Profile  3. Primary osteoarthritis of right knee  - meloxicam (MOBIC) 7.5 MG tablet; Take 1 tablet (7.5 mg total) by mouth daily.  Dispense: 30 tablet; Refill: 2  4. Benign hypertension  - losartan (COZAAR) 50 MG tablet; Take 1 tablet (50 mg total) by mouth daily.  Dispense: 30 tablet; Refill: 5   Herald Vallin Asad A. [facility] 12/25/2015 4:24 PM

## 2015-12-26 ENCOUNTER — Telehealth: Payer: Self-pay

## 2015-12-26 MED ORDER — OMEPRAZOLE 20 MG PO CPDR: 20.0000 mg | DELAYED_RELEASE_CAPSULE | Freq: Two times a day (BID) | ORAL | Status: DC

## 2015-12-26 NOTE — Telephone Encounter (Signed)
Omperazole 20 mg has been refilled and sent to [facility] [address]

## 2015-12-31 DIAGNOSIS — E785 Hyperlipidemia, unspecified: Secondary | ICD-10-CM | POA: Diagnosis not present

## 2016-01-01 LAB — LIPID PANEL
Chol/HDL Ratio: 4.2 ratio units (ref 0.0–4.4)
Cholesterol, Total: 201 mg/dL — ABNORMAL HIGH (ref 100–199)
HDL: 48 mg/dL (ref >39)
LDL Calculated: 121 mg/dL — ABNORMAL HIGH (ref 0–99)
Triglycerides: 162 mg/dL — ABNORMAL HIGH (ref 0–149)
VLDL Cholesterol Cal: 32 mg/dL (ref 5–40)

## 2016-01-06 ENCOUNTER — Telehealth: Payer: Self-pay

## 2016-01-06 NOTE — Telephone Encounter (Signed)
Dr. Corinna Capra office called asking for a referral for pt to be seen again, Auth obtained ICD-10: N81.2, N81.9 Dr. Corinna Capra NPI: [id number] Start - 01/15/16 Expires - 07/13/16 6 visits

## 2016-01-28 ENCOUNTER — Ambulatory Visit (INDEPENDENT_AMBULATORY_CARE_PROVIDER_SITE_OTHER): Payer: Commercial Managed Care - HMO | Admitting: Psychiatry

## 2016-01-28 ENCOUNTER — Encounter: Payer: Self-pay | Admitting: Psychiatry

## 2016-01-28 VITALS — BP 142/88 | HR 88 | Temp 97.8°F | Ht 63.0 in | Wt 157.8 lb

## 2016-01-28 DIAGNOSIS — F330 Major depressive disorder, recurrent, mild: Secondary | ICD-10-CM | POA: Diagnosis not present

## 2016-01-28 DIAGNOSIS — F411 Generalized anxiety disorder: Secondary | ICD-10-CM | POA: Insufficient documentation

## 2016-01-28 MED ORDER — SERTRALINE HCL 25 MG PO TABS: 25.0000 mg | ORAL_TABLET | Freq: Every day | ORAL | Status: DC

## 2016-01-28 MED ORDER — ALPRAZOLAM 0.25 MG PO TABS: 0.2500 mg | ORAL_TABLET | Freq: Every evening | ORAL | Status: DC | PRN

## 2016-01-28 NOTE — Progress Notes (Signed)
BH MD/PA/NP OP Progress Note  01/28/2016 3:50 PM [name]  MRN:  [id number]  Subjective:   Patient is a 75 year old female who presented for follow-up appointment. She reported that she has been taking her medications as prescribed. She reported that she has been compliant with her medications. Patient stated that she does not have any adverse effects of the medications and she takes alprazolam at night and has been sleeping well. Patient reported that she will have her spring break one week before Easter and she is planning to go either to her sister or to her son in [place]. She reported that her medications are helpful and she has been compliant with them. She currently denied having any adverse effects. She denied having any suicidal ideations or plans.      Chief Complaint:  Chief Complaint    Follow-up; Medication Refill     Visit Diagnosis:     ICD-9-CM ICD-10-CM   1. MDD (major depressive disorder), recurrent episode, mild (HCC) 296.31 F33.0     Past Medical History:  Past Medical History  Diagnosis Date  . Depression   . Anxiety   . Hypertension   . GERD (gastroesophageal reflux disease)   . Hypercholesteremia     Past Surgical History  Procedure Laterality Date  . Knee surgery Left   . Subclavian angiogram     Family History:  Family History  Problem Relation Age of Onset  . Cancer Mother     colon  . Alzheimer's disease Mother   . Hypertension Mother   . Cancer Brother     prostate  . Lymphoma Son   . Hypertension Father   . Cerebral aneurysm Father   . Diabetes Sister   . Depression Sister   . Hypertension Sister   . Depression Sister   . Cancer Brother   . Gallbladder disease Brother   . Prostate cancer Brother    Social History:  Social History   Social History  . Marital Status: Married    Spouse Name: N/A  . Number of Children: N/A  . Years of Education: N/A   Social History Main Topics  . Smoking status: Never  Smoker   . Smokeless tobacco: Never Used  . Alcohol Use: No  . Drug Use: No  . Sexual Activity: Not Currently   Other Topics Concern  . None   Social History Narrative   Additional History:  . She brought the bag  of her medications with her.  Assessment:   Musculoskeletal: Strength & Muscle Tone: within normal limits Gait & Station: normal Patient leans: N/A  Psychiatric Specialty Exam: HPI   ROS   Blood pressure 142/88, pulse 88, temperature 97.8 F (36.6 C), temperature source Tympanic, height 5\' 3"  (1.6 m), weight 157 lb 12.8 oz (71.578 kg), SpO2 98 %.Body mass index is 27.96 kg/(m^2).  General Appearance: Casual and Fairly Groomed  Eye Contact:  Fair  Speech:  Clear and Coherent  Volume:  Normal  Mood:  Anxious  Affect:  Congruent  Thought Process:  Coherent, Linear and Logical  Orientation:  Full (Time, Place, and Person)  Thought Content:  WDL  Suicidal Thoughts:  No  Homicidal Thoughts:  No  Memory:  NA  Judgement:  Fair  Insight:  Fair  Psychomotor Activity:  Increased  Concentration:  Fair  Recall:  [employer] of Knowledge: Fair  Language: Fair  Akathisia:  No  Handed:  Right  AIMS (if indicated):  Assets:  Communication Skills Desire for Improvement Social Support  ADL's:  Intact  Cognition: WNL  Sleep:     Is the patient at risk to self?  No. Has the patient been a risk to self in the past 6 months?  No. Has the patient been a risk to self within the distant past?  No. Is the patient a risk to others?  No. Has the patient been a risk to others in the past 6 months?  No. Has the patient been a risk to others within the distant past?  No.  Current Medications: Current Outpatient Prescriptions  Medication Sig Dispense Refill  . ALPRAZolam (XANAX) 0.25 MG tablet Take 1 tablet (0.25 mg total) by mouth at bedtime as needed for anxiety. 30 tablet 1  . aspirin 81 MG tablet Take 1 tablet by mouth daily.    . COMBIGAN 0.2-0.5 % ophthalmic  solution INSTILL 1 DROP IN BOTH EYES TWICE A DAY  3  . Light Mineral Oil-Mineral Oil (RETAINE MGD OP) Apply to eye.    . losartan (COZAAR) 50 MG tablet Take 1 tablet (50 mg total) by mouth daily. 30 tablet 5  . LUMIGAN 0.01 % SOLN Place 1 drop into both eyes at bedtime.  1  . meloxicam (MOBIC) 7.5 MG tablet Take 1 tablet (7.5 mg total) by mouth daily. 30 tablet 2  . omeprazole (PRILOSEC) 20 MG capsule Take 1 capsule (20 mg total) by mouth 2 (two) times daily. 90 capsule 0  . Polyethyl Glycol-Propyl Glycol (SYSTANE OP) Apply to eye.    . sertraline (ZOLOFT) 25 MG tablet Take 1 tablet (25 mg total) by mouth daily. 90 tablet 0   No current facility-administered medications for this visit.    Medical Decision Making:  Review of Psycho-Social Stressors (1), Discuss test with performing physician (1) and Review of Last Therapy Session (1)  Treatment Plan Summary:Medication management   Depression Continue Zoloft 25 mg by mouth daily. She takes alprazolam on a when necessary basis. She was given the prescription    Follow-up in  3 months or earlier depending on the symptoms   More than 50% of the time spent in psychoeducation, counseling and coordination of care.  Time spent with the patient 25 minutes   This note was generated in part or whole with voice recognition software. Voice regonition is usually quite accurate but there are transcription errors that can and very often do occur. I apologize for any typographical errors that were not detected and corrected.    Rainey Pines, MD  01/28/2016, 3:50 PM

## 2016-03-02 ENCOUNTER — Encounter: Payer: Self-pay | Admitting: Family Medicine

## 2016-03-02 ENCOUNTER — Ambulatory Visit
Admission: RE | Admit: 2016-03-02 | Payer: Commercial Managed Care - HMO | Source: Ambulatory Visit | Admitting: *Deleted

## 2016-03-02 ENCOUNTER — Ambulatory Visit
Admission: RE | Admit: 2016-03-02 | Discharge: 2016-03-02 | Disposition: A | Discharge status: Home / Self Care | Payer: Commercial Managed Care - HMO | Source: Ambulatory Visit | Attending: Family Medicine | Admitting: Family Medicine

## 2016-03-02 ENCOUNTER — Ambulatory Visit (INDEPENDENT_AMBULATORY_CARE_PROVIDER_SITE_OTHER): Payer: Commercial Managed Care - HMO | Admitting: Family Medicine

## 2016-03-02 VITALS — BP 138/66 | HR 88 | Temp 98.7°F | Resp 14 | Ht 63.0 in | Wt 159.5 lb

## 2016-03-02 DIAGNOSIS — R0781 Pleurodynia: Secondary | ICD-10-CM

## 2016-03-02 MED ORDER — TRAMADOL HCL 50 MG PO TABS
50.0000 mg | ORAL_TABLET | Freq: Two times a day (BID) | ORAL | Status: DC | PRN
Start: 2016-03-02 — End: 2016-08-17

## 2016-03-02 NOTE — Progress Notes (Signed)
Name: [name]   MRN: [id number]    DOB: [birth year]   Date:03/02/2016       Progress Note  Subjective  Chief Complaint  Chief Complaint  Patient presents with  . Rib Injury    Onset-Friday night, patient was bending over to get clothes out of her washing machine and heard a pop. Patient states her right side is very tender and painful, and would like it to be examined. Patient is aching and sharp and constant, states when ever she bend over it bothers her. Patient has tried Aleve and it helped a little.     HPI  Right rib cage pain: she states she bent over the washing machine to reach for something on the bottom and she heard a popping sound and developed right anterior rib pain, she has been having difficulty sleeping at night, but no difficulty breathing at this time. She states she had a bone density in the past and does not have osteoporosis.    Patient Active Problem List   Diagnosis Date Noted  . Anxiety, generalized 01/28/2016  . BP (high blood pressure) 01/28/2016  . Osteoarthritis of right knee 12/25/2015  . Depression 09/05/2015  . Abnormal brain CT 06/20/2015  . Anxiety, mild 06/20/2015  . Blood in feces 06/20/2015  . Brain lesion 06/20/2015  . 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 Clinical depression 06/20/2015  . Dyslipidemia 06/20/2015  . Dizziness 06/20/2015  . HLD (hyperlipidemia) 06/20/2015  . Benign hypertension 06/20/2015  . Affective disorder, major ([place]) 06/20/2015  . Arthralgia of shoulder 06/20/2015  . Herpes zona 06/20/2015  . Breast disease 06/20/2015  . Pain in shoulder 06/20/2015  . Glaucoma 06/20/2015  . Annual physical exam 06/04/2015  . Post-menopausal 06/04/2015  . Bilateral leg weakness 06/04/2015    Past Surgical History  Procedure Laterality Date  . Knee surgery Left   . Subclavian angiogram      Family History  Problem Relation Age of Onset  . Cancer Mother     colon  . Alzheimer's disease Mother   . Hypertension  Mother   . Cancer Brother     prostate  . Lymphoma Son   . Hypertension Father   . Cerebral aneurysm Father   . Diabetes Sister   . Depression Sister   . Hypertension Sister   . Depression Sister   . Cancer Brother   . Gallbladder disease Brother   . Prostate cancer Brother     Social History   Social History  . Marital Status: Married    Spouse Name: N/A  . Number of Children: N/A  . Years of Education: N/A   Occupational History  . Not on file.   Social History Main Topics  . Smoking status: Never Smoker   . Smokeless tobacco: Never Used  . Alcohol Use: No  . Drug Use: No  . Sexual Activity: Not Currently   Other Topics Concern  . Not on file   Social History Narrative     Current outpatient prescriptions:  .  aspirin 81 MG tablet, Take 1 tablet by mouth daily., Disp: , Rfl:  .  COMBIGAN 0.2-0.5 % ophthalmic solution, INSTILL 1 DROP IN BOTH EYES TWICE A DAY, Disp: , Rfl: 3 .  Light Mineral Oil-Mineral Oil (RETAINE MGD OP), Apply to eye., Disp: , Rfl:  .  losartan (COZAAR) 50 MG tablet, Take 1 tablet (50 mg total) by mouth daily., Disp: 30 tablet, Rfl: 5 .  LUMIGAN 0.01 % SOLN, Place 1  drop into both eyes at bedtime., Disp: , Rfl: 1 .  omeprazole (PRILOSEC) 20 MG capsule, Take 1 capsule (20 mg total) by mouth 2 (two) times daily., Disp: 90 capsule, Rfl: 0 .  Polyethyl Glycol-Propyl Glycol (SYSTANE OP), Apply to eye., Disp: , Rfl:  .  sertraline (ZOLOFT) 25 MG tablet, Take 1 tablet (25 mg total) by mouth daily., Disp: 90 tablet, Rfl: 0 .  ALPRAZolam (XANAX) 0.25 MG tablet, Take 1 tablet (0.25 mg total) by mouth at bedtime as needed for anxiety. (Patient not taking: Reported on 03/02/2016), Disp: 30 tablet, Rfl: 1 .  meloxicam (MOBIC) 7.5 MG tablet, Take 1 tablet (7.5 mg total) by mouth daily. (Patient not taking: Reported on 03/02/2016), Disp: 30 tablet, Rfl: 2  Allergies  Allergen Reactions  . Codeine   . Sulfa Antibiotics      ROS  Ten systems reviewed and  is negative except as mentioned in HPI   Objective  Filed Vitals:   03/02/16 1544  BP: 138/66  Pulse: 88  Temp: 98.7 F (37.1 C)  TempSrc: Oral  Resp: 14  Height: 5\' 3"  (1.6 m)  Weight: 159 lb 8 oz (72.349 kg)  SpO2: 94%    Body mass index is 28.26 kg/(m^2).  Physical Exam  Constitutional: Patient appears well-developed and well-nourished.  No distress.  HEENT: head atraumatic, normocephalic, pupils equal and reactive to light, neck supple, throat within normal limits Cardiovascular: Normal rate, regular rhythm and normal heart sounds.  No murmur heard. No BLE edema. Right anterior chest wall pain  Pulmonary/Chest: Effort normal and breath sounds normal. No respiratory distress. Abdominal: Soft.  There is no tenderness. Psychiatric: Patient has a normal mood and affect. behavior is normal. Judgment and thought content normal.  Recent Results (from the past 2160 hour(s))  Lipid Profile     Status: Abnormal   Collection Time: 12/31/15  8:40 AM  Result Value Ref Range   Cholesterol, Total 201 (H) 100 - 199 mg/dL   Triglycerides 162 (H) 0 - 149 mg/dL   HDL 48 >39 mg/dL   VLDL Cholesterol Cal 32 5 - 40 mg/dL   LDL Calculated 121 (H) 0 - 99 mg/dL   Chol/HDL Ratio 4.2 0.0 - 4.4 ratio units    Comment:                                   T. Chol/HDL Ratio                                             Men  Women                               1/2 Avg.Risk  3.4    3.3                                   Avg.Risk  5.0    4.4                                2X Avg.Risk  9.6    7.1  3X Avg.Risk 23.4   11.0      PHQ2/9: Depression screen [facility] 2/9 03/02/2016 12/25/2015 09/05/2015 06/20/2015 06/04/2015  Decreased Interest 0 0 0 1 1  Down, Depressed, Hopeless 0 0 0 1 1  PHQ - 2 Score 0 0 0 2 2  Altered sleeping - - - 0 0  Tired, decreased energy - - - 0 3  Change in appetite - - - 0 0  Feeling bad or failure about yourself  - - - 0 0  Trouble concentrating - - -  0 0  Moving slowly or fidgety/restless - - - 0 0  Suicidal thoughts - - - 0 0  PHQ-9 Score - - - 2 5  Difficult doing work/chores - - - Very difficult Very difficult     Fall Risk: Fall Risk  03/02/2016 12/25/2015 09/05/2015 06/20/2015 06/04/2015  Falls in the past year? No No No No No     Functional Status Survey: Is the patient deaf or have difficulty hearing?: No Does the patient have difficulty seeing, even when wearing glasses/contacts?: No Does the patient have difficulty concentrating, remembering, or making decisions?: No Does the patient have difficulty walking or climbing stairs?: No Does the patient have difficulty dressing or bathing?: No Does the patient have difficulty doing errands alone such as visiting a doctor's office or shopping?: No    Assessment & Plan  1. Rib pain on right side  - DG Ribs Unilateral Right; Future - traMADol (ULTRAM) 50 MG tablet; Take 1 tablet (50 mg total) by mouth every 12 (twelve) hours as needed.  Dispense: 60 tablet; Refill: 0 May take Tylenol 500 mg three times daily, discussed possible side effects of Tramadol, including sedation and also constipation

## 2016-03-03 DIAGNOSIS — H401132 Primary open-angle glaucoma, bilateral, moderate stage: Secondary | ICD-10-CM | POA: Diagnosis not present

## 2016-03-03 DIAGNOSIS — H01001 Unspecified blepharitis right upper eyelid: Secondary | ICD-10-CM | POA: Diagnosis not present

## 2016-03-03 DIAGNOSIS — H04123 Dry eye syndrome of bilateral lacrimal glands: Secondary | ICD-10-CM | POA: Diagnosis not present

## 2016-03-03 DIAGNOSIS — H01005 Unspecified blepharitis left lower eyelid: Secondary | ICD-10-CM | POA: Diagnosis not present

## 2016-03-18 DIAGNOSIS — N816 Rectocele: Secondary | ICD-10-CM | POA: Diagnosis not present

## 2016-03-18 DIAGNOSIS — N8189 Other female genital prolapse: Secondary | ICD-10-CM | POA: Diagnosis not present

## 2016-03-18 DIAGNOSIS — N8110 Cystocele, unspecified: Secondary | ICD-10-CM | POA: Diagnosis not present

## 2016-03-23 ENCOUNTER — Encounter: Payer: Self-pay | Admitting: Family Medicine

## 2016-03-23 ENCOUNTER — Ambulatory Visit (INDEPENDENT_AMBULATORY_CARE_PROVIDER_SITE_OTHER): Payer: Commercial Managed Care - HMO | Admitting: Family Medicine

## 2016-03-23 VITALS — BP 140/80 | HR 69 | Temp 98.8°F | Resp 14 | Ht 63.0 in | Wt 157.0 lb

## 2016-03-23 DIAGNOSIS — E785 Hyperlipidemia, unspecified: Secondary | ICD-10-CM

## 2016-03-23 DIAGNOSIS — R071 Chest pain on breathing: Secondary | ICD-10-CM | POA: Diagnosis not present

## 2016-03-23 DIAGNOSIS — R0789 Other chest pain: Secondary | ICD-10-CM | POA: Insufficient documentation

## 2016-03-23 MED ORDER — ROSUVASTATIN CALCIUM 10 MG PO TABS: 10.0000 mg | ORAL_TABLET | Freq: Every day | ORAL | Status: DC

## 2016-03-23 NOTE — Progress Notes (Signed)
Name: [name]   MRN: [id number]    DOB: 1941-06-03   Date:03/23/2016       Progress Note  Subjective  Chief Complaint  Chief Complaint  Patient presents with  . Hyperlipidemia    discuss labs  . Chest Pain    rib pain on left side    Hyperlipidemia This is a chronic problem. The problem is uncontrolled. Recent lipid tests were reviewed and are high. Associated symptoms include chest pain. Pertinent negatives include no myalgias. She is currently on no antihyperlipidemic treatment (Stopped Atorvastatin before lab work in February 2017 due to muscle aches). Compliance problems include medication side effects.    Right sided rib pain: Pt. Presents for evaluation of right rib pain, started 3 weeks ago after she reached down into her son's washer and noticed a sudden sharp pain on her right side along with the feeling that something had broken. She was seen and ordered a rib series which was unremarkable.  Was started on Tramadol 50 mg every 12 hours as needed. Her pain is better but not relieved.    Past Medical History  Diagnosis Date  . Depression   . Anxiety   . Hypertension   . GERD (gastroesophageal reflux disease)   . Hypercholesteremia     Past Surgical History  Procedure Laterality Date  . Knee surgery Left   . Subclavian angiogram      Family History  Problem Relation Age of Onset  . Cancer Mother     colon  . Alzheimer's disease Mother   . Hypertension Mother   . Cancer Brother     prostate  . Lymphoma Son   . Hypertension Father   . Cerebral aneurysm Father   . Diabetes Sister   . Depression Sister   . Hypertension Sister   . Depression Sister   . Cancer Brother   . Gallbladder disease Brother   . Prostate cancer Brother     Social History   Social History  . Marital Status: Married    Spouse Name: N/A  . Number of Children: N/A  . Years of Education: N/A   Occupational History  . Not on file.   Social History Main Topics  .  Smoking status: Never Smoker   . Smokeless tobacco: Never Used  . Alcohol Use: No  . Drug Use: No  . Sexual Activity: Not Currently   Other Topics Concern  . Not on file   Social History Narrative     Current outpatient prescriptions:  .  ALPRAZolam (XANAX) 0.25 MG tablet, Take 1 tablet (0.25 mg total) by mouth at bedtime as needed for anxiety. (Patient not taking: Reported on 03/02/2016), Disp: 30 tablet, Rfl: 1 .  aspirin 81 MG tablet, Take 1 tablet by mouth daily., Disp: , Rfl:  .  COMBIGAN 0.2-0.5 % ophthalmic solution, INSTILL 1 DROP IN BOTH EYES TWICE A DAY, Disp: , Rfl: 3 .  Light Mineral Oil-Mineral Oil (RETAINE MGD OP), Apply to eye., Disp: , Rfl:  .  losartan (COZAAR) 50 MG tablet, Take 1 tablet (50 mg total) by mouth daily., Disp: 30 tablet, Rfl: 5 .  LUMIGAN 0.01 % SOLN, Place 1 drop into both eyes at bedtime., Disp: , Rfl: 1 .  meloxicam (MOBIC) 7.5 MG tablet, Take 1 tablet (7.5 mg total) by mouth daily. (Patient not taking: Reported on 03/02/2016), Disp: 30 tablet, Rfl: 2 .  omeprazole (PRILOSEC) 20 MG capsule, Take 1 capsule (20 mg total) by mouth 2 (  two) times daily., Disp: 90 capsule, Rfl: 0 .  Polyethyl Glycol-Propyl Glycol (SYSTANE OP), Apply to eye., Disp: , Rfl:  .  sertraline (ZOLOFT) 25 MG tablet, Take 1 tablet (25 mg total) by mouth daily., Disp: 90 tablet, Rfl: 0 .  traMADol (ULTRAM) 50 MG tablet, Take 1 tablet (50 mg total) by mouth every 12 (twelve) hours as needed., Disp: 60 tablet, Rfl: 0  Allergies  Allergen Reactions  . Codeine   . Sulfa Antibiotics      Review of Systems  Cardiovascular: Positive for chest pain.  Musculoskeletal: Negative for myalgias.    Objective  Filed Vitals:   03/23/16 1529  BP: 140/80  Pulse: 69  Temp: 98.8 F (37.1 C)  TempSrc: Oral  Resp: 14  Height: 5\' 3"  (1.6 m)  Weight: 157 lb (71.215 kg)  SpO2: 93%    Physical Exam  Constitutional: She is well-developed, well-nourished, and in no distress.    Cardiovascular: Normal rate.   Pulmonary/Chest: Effort normal and breath sounds normal. She exhibits tenderness. She exhibits no deformity and no swelling.    Nursing note and vitals reviewed.    Assessment & Plan  1. Dyslipidemia Reviewed FLP from February 2017, elevated total cholesterol, triglycerides, and LDL. Start on Crestor 10 mg at bedtime. - rosuvastatin (CRESTOR) 10 MG tablet; Take 1 tablet (10 mg total) by mouth daily.  Dispense: 90 tablet; Refill: 0  2. Anterior chest wall pain Likely muscular sprain of the anterior chest wall, reviewed rib series results. Continue on tramadol and advised application of moist heat to the affected area.   Jenissa Tyrell Asad A. [facility] 03/23/2016 3:36 PM

## 2016-04-29 ENCOUNTER — Ambulatory Visit: Payer: Commercial Managed Care - HMO | Admitting: Psychiatry

## 2016-05-01 DIAGNOSIS — H401132 Primary open-angle glaucoma, bilateral, moderate stage: Secondary | ICD-10-CM | POA: Diagnosis not present

## 2016-05-01 DIAGNOSIS — H01001 Unspecified blepharitis right upper eyelid: Secondary | ICD-10-CM | POA: Diagnosis not present

## 2016-05-01 DIAGNOSIS — H01002 Unspecified blepharitis right lower eyelid: Secondary | ICD-10-CM | POA: Diagnosis not present

## 2016-05-01 DIAGNOSIS — H04123 Dry eye syndrome of bilateral lacrimal glands: Secondary | ICD-10-CM | POA: Diagnosis not present

## 2016-05-06 ENCOUNTER — Telehealth: Payer: Self-pay | Admitting: Family Medicine

## 2016-05-06 DIAGNOSIS — H409 Unspecified glaucoma: Secondary | ICD-10-CM

## 2016-05-06 NOTE — Telephone Encounter (Signed)
Patient has appointment with Dr. Sandra Cockayne at [facility] on 05-12-16 @ 8:30. Due to insurance she is needing a referral. Patient has Glycoma

## 2016-05-12 DIAGNOSIS — H401132 Primary open-angle glaucoma, bilateral, moderate stage: Secondary | ICD-10-CM | POA: Diagnosis not present

## 2016-06-02 ENCOUNTER — Other Ambulatory Visit: Payer: Self-pay | Admitting: Family Medicine

## 2016-06-04 ENCOUNTER — Encounter: Payer: Self-pay | Admitting: Family Medicine

## 2016-06-04 ENCOUNTER — Ambulatory Visit (INDEPENDENT_AMBULATORY_CARE_PROVIDER_SITE_OTHER): Payer: Commercial Managed Care - HMO | Admitting: Family Medicine

## 2016-06-04 VITALS — BP 122/60 | HR 100 | Temp 98.9°F | Resp 16 | Ht 63.0 in | Wt 156.2 lb

## 2016-06-04 DIAGNOSIS — Z780 Asymptomatic menopausal state: Secondary | ICD-10-CM | POA: Diagnosis not present

## 2016-06-04 DIAGNOSIS — Z0000 Encounter for general adult medical examination without abnormal findings: Secondary | ICD-10-CM | POA: Diagnosis not present

## 2016-06-04 NOTE — Progress Notes (Signed)
Name: [name]   MRN: [id number]    DOB: [birth year]   Date:06/04/2016       Progress Note  Subjective  Chief Complaint  Chief Complaint  Patient presents with  . Annual Exam    HPI  Pt. Is here for a Complete Physical Exam. Last Mammogram was in January 2017, was normal. Last colonoscopy was approximately 5 years ago, has not gotten a reminder in the mail for repeat c-scope. Never had a bone density scan  Past Medical History  Diagnosis Date  . Depression   . Anxiety   . Hypertension   . GERD (gastroesophageal reflux disease)   . Hypercholesteremia     Past Surgical History  Procedure Laterality Date  . Knee surgery Left   . Subclavian angiogram      Family History  Problem Relation Age of Onset  . Cancer Mother     colon  . Alzheimer's disease Mother   . Hypertension Mother   . Cancer Brother     prostate  . Lymphoma Son   . Hypertension Father   . Cerebral aneurysm Father   . Diabetes Sister   . Depression Sister   . Hypertension Sister   . Depression Sister   . Cancer Brother   . Gallbladder disease Brother   . Prostate cancer Brother     Social History   Social History  . Marital Status: Married    Spouse Name: N/A  . Number of Children: N/A  . Years of Education: N/A   Occupational History  . Not on file.   Social History Main Topics  . Smoking status: Never Smoker   . Smokeless tobacco: Never Used  . Alcohol Use: No  . Drug Use: No  . Sexual Activity: Not Currently   Other Topics Concern  . Not on file   Social History Narrative     Current outpatient prescriptions:  .  aspirin 81 MG tablet, Take 1 tablet by mouth daily., Disp: , Rfl:  .  COMBIGAN 0.2-0.5 % ophthalmic solution, INSTILL 1 DROP IN BOTH EYES TWICE A DAY, Disp: , Rfl: 3 .  Light Mineral Oil-Mineral Oil (RETAINE MGD OP), Apply to eye., Disp: , Rfl:  .  losartan (COZAAR) 50 MG tablet, Take 1 tablet (50 mg total) by mouth daily., Disp: 30 tablet, Rfl: 5 .   LUMIGAN 0.01 % SOLN, Place 1 drop into both eyes at bedtime., Disp: , Rfl: 1 .  omeprazole (PRILOSEC) 20 MG capsule, TAKE 1 CAPSULE (20 MG TOTAL) BY MOUTH 2 (TWO) TIMES DAILY., Disp: 180 capsule, Rfl: 0 .  sertraline (ZOLOFT) 25 MG tablet, Take 1 tablet (25 mg total) by mouth daily., Disp: 90 tablet, Rfl: 0 .  ALPRAZolam (XANAX) 0.25 MG tablet, Take 1 tablet (0.25 mg total) by mouth at bedtime as needed for anxiety. (Patient not taking: Reported on 06/04/2016), Disp: 30 tablet, Rfl: 1 .  meloxicam (MOBIC) 7.5 MG tablet, Take 1 tablet (7.5 mg total) by mouth daily. (Patient not taking: Reported on 06/04/2016), Disp: 30 tablet, Rfl: 2 .  Polyethyl Glycol-Propyl Glycol (SYSTANE OP), Apply to eye. Reported on 06/04/2016, Disp: , Rfl:  .  rosuvastatin (CRESTOR) 10 MG tablet, Take 1 tablet (10 mg total) by mouth daily. (Patient not taking: Reported on 06/04/2016), Disp: 90 tablet, Rfl: 0 .  traMADol (ULTRAM) 50 MG tablet, Take 1 tablet (50 mg total) by mouth every 12 (twelve) hours as needed. (Patient not taking: Reported on 06/04/2016), Disp: 60 tablet, Rfl:  0  Allergies  Allergen Reactions  . Codeine   . Sulfa Antibiotics      Review of Systems  Constitutional: Negative for fever, chills and malaise/fatigue.  HENT: Positive for congestion.   Eyes: Positive for blurred vision (has glaucoma, being followed by Ophthalmologist.).  Respiratory: Positive for cough (post nasal drainage). Negative for shortness of breath.   Cardiovascular: Negative for chest pain.  Gastrointestinal: Negative for nausea, vomiting, abdominal pain, constipation, blood in stool and melena.  Genitourinary: Negative for hematuria.  Musculoskeletal: Positive for joint pain. Negative for myalgias and back pain.  Neurological: Negative for dizziness and headaches.  Psychiatric/Behavioral: Positive for depression. The patient is nervous/anxious.     Objective  Filed Vitals:   06/04/16 1102  BP: 122/60  Pulse: 100  Temp: 98.9  F (37.2 C)  TempSrc: Oral  Resp: 16  Height: 5\' 3"  (1.6 m)  Weight: 156 lb 3.2 oz (70.852 kg)  SpO2: 97%    Physical Exam  Constitutional: She is oriented to person, place, and time and well-developed, well-nourished, and in no distress.  HENT:  Head: Normocephalic and atraumatic.  Mouth/Throat: No posterior oropharyngeal erythema.  Eyes: Pupils are equal, round, and reactive to light.  Cardiovascular: Normal rate, regular rhythm and normal heart sounds.   No murmur heard. Pulmonary/Chest: Effort normal and breath sounds normal. She has no wheezes.  Abdominal: Soft. Normal appearance and bowel sounds are normal.  Musculoskeletal:       Right ankle: She exhibits no swelling. Tenderness.       Left ankle: She exhibits no swelling. Tenderness.  Neurological: She is alert and oriented to person, place, and time.  Psychiatric: Mood, memory, affect and judgment normal.  Nursing note and vitals reviewed.     Assessment & Plan  1. Well woman exam (no gynecological exam) Obtain age-appropriate laboratory screenings. Colonoscopy scheduled determine by gastroenterology. Mammogram in January 2018.  - Lipid Profile - CBC with Differential - COMPLETE METABOLIC PANEL WITH GFR - TSH - Vitamin D (25 hydroxy)  2. Post-menopausal  - DG Bone Density; Future   [name] [facility] 06/04/2016 11:29 AM

## 2016-06-05 LAB — CBC WITH DIFFERENTIAL/PLATELET
Basophils Absolute: 60 cells/uL (ref 0–200)
Basophils Relative: 1 %
Eosinophils Absolute: 180 cells/uL (ref 15–500)
Eosinophils Relative: 3 %
HCT: 39.7 % (ref 35.0–45.0)
Hemoglobin: 13 g/dL (ref 11.7–15.5)
Lymphocytes Relative: 32 %
Lymphs Abs: 1920 cells/uL (ref 850–3900)
MCH: 27.9 pg (ref 27.0–33.0)
MCHC: 32.7 g/dL (ref 32.0–36.0)
MCV: 85.2 fL (ref 80.0–100.0)
MPV: 9.7 fL (ref 7.5–12.5)
Monocytes Absolute: 300 cells/uL (ref 200–950)
Monocytes Relative: 5 %
Neutro Abs: 3540 cells/uL (ref 1500–7800)
Neutrophils Relative %: 59 %
Platelets: 264 10*3/uL (ref 140–400)
RBC: 4.66 MIL/uL (ref 3.80–5.10)
RDW: 14 % (ref 11.0–15.0)
WBC: 6 10*3/uL (ref 3.8–10.8)

## 2016-06-05 LAB — COMPLETE METABOLIC PANEL WITH GFR
ALT: 15 U/L (ref 6–29)
AST: 17 U/L (ref 10–35)
Albumin: 4.1 g/dL (ref 3.6–5.1)
Alkaline Phosphatase: 59 U/L (ref 33–130)
BUN: 17 mg/dL (ref 7–25)
CO2: 24 mmol/L (ref 20–31)
Calcium: 9.4 mg/dL (ref 8.6–10.4)
Chloride: 108 mmol/L (ref 98–110)
Creat: 0.8 mg/dL (ref 0.60–0.93)
GFR, Est African American: 83 mL/min (ref >=60)
GFR, Est Non African American: 72 mL/min (ref >=60)
Glucose, Bld: 94 mg/dL (ref 65–99)
Potassium: 4.3 mmol/L (ref 3.5–5.3)
Sodium: 145 mmol/L (ref 135–146)
Total Bilirubin: 0.5 mg/dL (ref 0.2–1.2)
Total Protein: 6.8 g/dL (ref 6.1–8.1)

## 2016-06-05 LAB — LIPID PANEL
Cholesterol: 209 mg/dL — ABNORMAL HIGH (ref 125–200)
HDL: 57 mg/dL (ref >=46)
LDL Cholesterol: 119 mg/dL (ref <130)
Total CHOL/HDL Ratio: 3.7 Ratio (ref <=5.0)
Triglycerides: 166 mg/dL — ABNORMAL HIGH (ref <150)
VLDL: 33 mg/dL — ABNORMAL HIGH (ref <30)

## 2016-06-05 LAB — TSH: TSH: 2.39 mIU/L

## 2016-06-06 LAB — VITAMIN D 25 HYDROXY (VIT D DEFICIENCY, FRACTURES): Vit D, 25-Hydroxy: 30 ng/mL (ref 30–100)

## 2016-06-22 ENCOUNTER — Telehealth: Payer: Self-pay | Admitting: Emergency Medicine

## 2016-06-22 NOTE — Telephone Encounter (Signed)
Called patient for 1 week and tried to leave message again to day. Phone saying memory full.

## 2016-06-25 ENCOUNTER — Ambulatory Visit: Payer: Self-pay | Admitting: Family Medicine

## 2016-06-30 ENCOUNTER — Telehealth: Payer: Self-pay | Admitting: Family Medicine

## 2016-07-02 ENCOUNTER — Other Ambulatory Visit: Payer: Self-pay

## 2016-07-02 DIAGNOSIS — I10 Essential (primary) hypertension: Secondary | ICD-10-CM

## 2016-07-03 ENCOUNTER — Other Ambulatory Visit: Payer: Self-pay

## 2016-07-03 ENCOUNTER — Other Ambulatory Visit: Payer: Self-pay | Admitting: Family Medicine

## 2016-07-03 DIAGNOSIS — I10 Essential (primary) hypertension: Secondary | ICD-10-CM

## 2016-07-03 DIAGNOSIS — E785 Hyperlipidemia, unspecified: Secondary | ICD-10-CM

## 2016-07-03 MED ORDER — LOSARTAN POTASSIUM 50 MG PO TABS: 50.0000 mg | ORAL_TABLET | Freq: Every day | ORAL | 5 refills | Status: DC

## 2016-07-03 NOTE — Telephone Encounter (Signed)
done

## 2016-07-14 DIAGNOSIS — H401132 Primary open-angle glaucoma, bilateral, moderate stage: Secondary | ICD-10-CM | POA: Diagnosis not present

## 2016-07-16 ENCOUNTER — Other Ambulatory Visit: Payer: Self-pay | Admitting: Psychiatry

## 2016-07-27 ENCOUNTER — Encounter: Payer: Self-pay | Admitting: Family Medicine

## 2016-07-27 ENCOUNTER — Ambulatory Visit (INDEPENDENT_AMBULATORY_CARE_PROVIDER_SITE_OTHER): Payer: Medicare HMO | Admitting: Family Medicine

## 2016-07-27 VITALS — BP 128/72 | HR 84 | Temp 99.1°F | Resp 16 | Ht 63.0 in | Wt 157.5 lb

## 2016-07-27 DIAGNOSIS — B369 Superficial mycosis, unspecified: Secondary | ICD-10-CM | POA: Diagnosis not present

## 2016-07-27 DIAGNOSIS — Z1211 Encounter for screening for malignant neoplasm of colon: Secondary | ICD-10-CM | POA: Diagnosis not present

## 2016-07-27 DIAGNOSIS — E785 Hyperlipidemia, unspecified: Secondary | ICD-10-CM | POA: Diagnosis not present

## 2016-07-27 MED ORDER — CLOTRIMAZOLE 1 % EX CREA: 1.0000 "application " | TOPICAL_CREAM | Freq: Two times a day (BID) | CUTANEOUS | 0 refills | Status: AC

## 2016-07-27 MED ORDER — PITAVASTATIN CALCIUM 1 MG PO TABS: 1.0000 | ORAL_TABLET | Freq: Every day | ORAL | 2 refills | Status: DC

## 2016-07-27 NOTE — Progress Notes (Signed)
Name: [name]   MRN: [id number]    DOB: Jun 12, 1941   Date:07/27/2016       Progress Note  Subjective  Chief Complaint  Chief Complaint  Patient presents with  . Rash    on neck for 2 weeks. Started out as a white spot     Rash  This is a new problem. The current episode started 1 to 4 weeks ago (3+ weeks). The problem has been gradually worsening since onset. The affected locations include the neck. The rash is characterized by itchiness, dryness, redness and scaling. Pertinent negatives include no cough, fatigue, fever or shortness of breath. Treatments tried: Applied a topical cream for itching that she got at [facility]. The treatment provided no relief.  Hyperlipidemia  This is a chronic problem. The problem is uncontrolled. Recent lipid tests were reviewed and are high. Pertinent negatives include no shortness of breath. She is currently on no antihyperlipidemic treatment (Stopped Crestor becasue of GI side effects, before Crestor, she has failed Lipitor.). Risk factors for coronary artery disease include dyslipidemia.     Past Medical History:  Diagnosis Date  . Anxiety   . Depression   . GERD (gastroesophageal reflux disease)   . Hypercholesteremia   . Hypertension     Past Surgical History:  Procedure Laterality Date  . KNEE SURGERY Left   . SUBCLAVIAN ANGIOGRAM      Family History  Problem Relation Age of Onset  . Cancer Mother     colon  . Alzheimer's disease Mother   . Hypertension Mother   . Cancer Brother     prostate  . Lymphoma Son   . Hypertension Father   . Cerebral aneurysm Father   . Diabetes Sister   . Depression Sister   . Hypertension Sister   . Depression Sister   . Cancer Brother   . Gallbladder disease Brother   . Prostate cancer Brother     Social History   Social History  . Marital status: Married    Spouse name: N/A  . Number of children: N/A  . Years of education: N/A   Occupational History  . Not on file.   Social  History Main Topics  . Smoking status: Never Smoker  . Smokeless tobacco: Never Used  . Alcohol use No  . Drug use: No  . Sexual activity: Not Currently   Other Topics Concern  . Not on file   Social History Narrative  . No narrative on file     Current Outpatient Prescriptions:  .  ALPRAZolam (XANAX) 0.25 MG tablet, Take 1 tablet (0.25 mg total) by mouth at bedtime as needed for anxiety., Disp: 30 tablet, Rfl: 1 .  aspirin 81 MG tablet, Take 1 tablet by mouth daily., Disp: , Rfl:  .  COMBIGAN 0.2-0.5 % ophthalmic solution, INSTILL 1 DROP IN BOTH EYES TWICE A DAY, Disp: , Rfl: 3 .  Light Mineral Oil-Mineral Oil (RETAINE MGD OP), Apply to eye., Disp: , Rfl:  .  losartan (COZAAR) 50 MG tablet, Take 1 tablet (50 mg total) by mouth daily., Disp: 30 tablet, Rfl: 5 .  LUMIGAN 0.01 % SOLN, Place 1 drop into both eyes at bedtime., Disp: , Rfl: 1 .  meloxicam (MOBIC) 7.5 MG tablet, Take 1 tablet (7.5 mg total) by mouth daily., Disp: 30 tablet, Rfl: 2 .  omeprazole (PRILOSEC) 20 MG capsule, TAKE 1 CAPSULE (20 MG TOTAL) BY MOUTH 2 (TWO) TIMES DAILY., Disp: 180 capsule, Rfl: 0 .  Polyethyl Glycol-Propyl Glycol (SYSTANE OP), Apply to eye. Reported on 06/04/2016, Disp: , Rfl:  .  rosuvastatin (CRESTOR) 10 MG tablet, Take 1 tablet (10 mg total) by mouth daily., Disp: 90 tablet, Rfl: 0 .  sertraline (ZOLOFT) 25 MG tablet, Take 1 tablet (25 mg total) by mouth daily., Disp: 90 tablet, Rfl: 0 .  traMADol (ULTRAM) 50 MG tablet, Take 1 tablet (50 mg total) by mouth every 12 (twelve) hours as needed., Disp: 60 tablet, Rfl: 0  Allergies  Allergen Reactions  . Codeine   . Sulfa Antibiotics      Review of Systems  Constitutional: Negative for fatigue and fever.  Respiratory: Negative for cough and shortness of breath.   Skin: Positive for rash.    Objective  Vitals:   07/27/16 1425  BP: 128/72  Pulse: 84  Resp: 16  Temp: 99.1 F (37.3 C)  TempSrc: Oral  SpO2: 94%  Weight: 157 lb 8 oz (71.4  kg)  Height: 5\' 3"  (1.6 m)    Physical Exam  Constitutional: She is well-developed, well-nourished, and in no distress.  Cardiovascular: Normal rate, regular rhythm, S1 normal and S2 normal.   No murmur heard. Pulmonary/Chest: Effort normal and breath sounds normal. No respiratory distress. She has no wheezes.  Skin: Lesion and rash noted. Rash is macular.     Hyperpigmented macular pruritic well-circumscribed lesions on the anterior neck  Psychiatric: Mood, memory, affect and judgment normal.  Nursing note and vitals reviewed.    Assessment & Plan  1. Fungal dermatitis  - clotrimazole (LOTRIMIN AF) 1 % cream; Apply 1 application topically 2 (two) times daily.  Dispense: 30 g; Refill: 0  2. Hyperlipidemia LDL goal <100 Could not take Crestor due to GI side effects, will start on Livalo 1 mg at bedtime,  - Pitavastatin Calcium 1 MG TABS; Take 1 tablet (1 mg total) by mouth at bedtime.  Dispense: 30 tablet; Refill: 2  3. Colon cancer screening Last colonoscopy in 2012, repeat colonoscopy to be performed in 2017. Referral to gastroenterology provided - Ambulatory referral to Gastroenterology  [facility] 07/27/2016 2:30 PM

## 2016-08-16 ENCOUNTER — Other Ambulatory Visit: Payer: Self-pay | Admitting: Psychiatry

## 2016-08-17 ENCOUNTER — Ambulatory Visit (INDEPENDENT_AMBULATORY_CARE_PROVIDER_SITE_OTHER): Payer: Commercial Managed Care - HMO | Admitting: General Surgery

## 2016-08-17 ENCOUNTER — Encounter: Payer: Self-pay | Admitting: General Surgery

## 2016-08-17 VITALS — BP 142/80 | HR 88 | Resp 14 | Ht 63.0 in | Wt 158.0 lb

## 2016-08-17 DIAGNOSIS — Z1211 Encounter for screening for malignant neoplasm of colon: Secondary | ICD-10-CM

## 2016-08-17 MED ORDER — POLYETHYLENE GLYCOL 3350 17 GM/SCOOP PO POWD: 1.0000 | Freq: Once | ORAL | 0 refills | Status: AC

## 2016-08-17 NOTE — Patient Instructions (Addendum)
Colonoscopy A colonoscopy is an exam to look at the entire large intestine (colon). This exam can help find problems such as tumors, polyps, inflammation, and areas of bleeding. The exam takes about 1 hour.  LET [facility] CARE PROVIDER KNOW ABOUT:   Any allergies you have.  All medicines you are taking, including vitamins, herbs, eye drops, creams, and over-the-counter medicines.  Previous problems you or members of your family have had with the use of anesthetics.  Any blood disorders you have.  Previous surgeries you have had.  Medical conditions you have. RISKS AND COMPLICATIONS  Generally, this is a safe procedure. However, as with any procedure, complications can occur. Possible complications include:  Bleeding.  Tearing or rupture of the colon wall.  Reaction to medicines given during the exam.  Infection (rare). BEFORE THE PROCEDURE   Ask your health care provider about changing or stopping your regular medicines.  You may be prescribed an oral bowel prep. This involves drinking a large amount of medicated liquid, starting the day before your procedure. The liquid will cause you to have multiple loose stools until your stool is almost clear or light green. This cleans out your colon in preparation for the procedure.  Do not eat or drink anything else once you have started the bowel prep, unless your health care provider tells you it is safe to do so.  Arrange for someone to drive you home after the procedure. PROCEDURE   You will be given medicine to help you relax (sedative).  You will lie on your side with your knees bent.  A long, flexible tube with a light and camera on the end (colonoscope) will be inserted through the rectum and into the colon. The camera sends video back to a computer screen as it moves through the colon. The colonoscope also releases carbon dioxide gas to inflate the colon. This helps your health care provider see the area better.  During  the exam, your health care provider may take a small tissue sample (biopsy) to be examined under a microscope if any abnormalities are found.  The exam is finished when the entire colon has been viewed. AFTER THE PROCEDURE   Do not drive for 24 hours after the exam.  You may have a small amount of blood in your stool.  You may pass moderate amounts of gas and have mild abdominal cramping or bloating. This is caused by the gas used to inflate your colon during the exam.  Ask when your test results will be ready and how you will get your results. Make sure you get your test results.   This information is not intended to replace advice given to you by your health care provider. Make sure you discuss any questions you have with your health care provider.   Document Released: 10/30/2000 Document Revised: 08/23/2013 Document Reviewed: 07/10/2013 Elsevier Interactive Patient Education [employer].  The patient is scheduled for a Colonoscopy at [facility] on 09/01/16. They are aware to call the day before to get their arrival time. Miralax prescription has been sent into the patient's pharmacy. The patient is aware of date and instructions.

## 2016-08-17 NOTE — Progress Notes (Signed)
[name], female   DOB: 1941/03/11, 75 y.o.   MRN: [id number]  Chief Complaint  Patient presents with  . Colonoscopy    HPI [name] is a 75 y.o. female here today for a evaluation of an screening colonoscopy. Her last one was done in 2012 at [facility] No GI issues, moves bowels daily. Denies bleeding. She works as [employer] as a Engineer, agricultural. HPI  Past Medical History:  Diagnosis Date  . Anxiety   . Depression   . GERD (gastroesophageal reflux disease)   . Hypercholesteremia   . Hypertension     Past Surgical History:  Procedure Laterality Date  . COLONOSCOPY  2012   [facility]  . KNEE SURGERY Left   . SUBCLAVIAN ANGIOGRAM    . UPPER GI ENDOSCOPY  2012    Family History  Problem Relation Age of Onset  . Cancer Mother 35    colon  . Alzheimer's disease Mother   . Hypertension Mother   . Cancer Brother     prostate  . Lymphoma Son   . Cancer Son 69    lymphoma  . Hypertension Father   . Cerebral aneurysm Father   . Diabetes Sister   . Depression Sister   . Hypertension Sister   . Depression Sister   . Cancer Brother   . Gallbladder disease Brother   . Prostate cancer Brother     Social History Social History  Substance Use Topics  . Smoking status: Never Smoker  . Smokeless tobacco: Never Used  . Alcohol use No    Allergies  Allergen Reactions  . Codeine   . Sulfa Antibiotics     Current Outpatient Prescriptions  Medication Sig Dispense Refill  . aspirin 81 MG tablet Take 1 tablet by mouth daily.    Sunday [place] Mineral Oil-Mineral Oil (RETAINE MGD OP) Apply to eye.    . losartan (COZAAR) 50 MG tablet Take 1 tablet (50 mg total) by mouth daily. 30 tablet 5  . LUMIGAN 0.01 % SOLN Place 1 drop into both eyes at bedtime.  1  . omeprazole (PRILOSEC) 20 MG capsule TAKE 1 CAPSULE (20 MG TOTAL) BY MOUTH 2 (TWO) TIMES DAILY. 180 capsule 0  . Polyethyl Glycol-Propyl Glycol (SYSTANE OP) Apply to eye. Reported on 06/04/2016     . sertraline (ZOLOFT) 25 MG tablet Take 1 tablet (25 mg total) by mouth daily. 90 tablet 0   No current facility-administered medications for this visit.     Review of Systems Review of Systems  Constitutional: Negative.   Respiratory: Negative.   Cardiovascular: Negative.   Gastrointestinal: Negative.     Blood pressure (!) 142/80, pulse 88, resp. rate 14, height 5\' 3"  (1.6 m), weight 158 lb (71.7 kg).  Physical Exam Physical Exam  Constitutional: She is oriented to person, place, and time. She appears well-developed and well-nourished.  Eyes: Conjunctivae are normal. No scleral icterus.  Neck: Neck supple.  Cardiovascular: Normal rate and regular rhythm.   Murmur heard.  Systolic murmur is present with a grade of 1/6  Pulmonary/Chest: Effort normal and breath sounds normal.  Abdominal: Soft. Bowel sounds are normal. There is no tenderness.  Neurological: She is alert and oriented to person, place, and time.  Skin: Skin is warm and dry.    Data Reviewed CBC dated 06/04/2016 showed a hemoglobin of 13.0 with an MCV of 85, platelet count of 264,000. Operative metabolic panel of the same date was unremarkable. Creatinine  of 0.8 with an estimated GFR of 72.  Assessment    Family history colon cancer.    Plan         Colonoscopy with possible biopsy/polypectomy prn: Information regarding the procedure, including its potential risks and complications (including but not limited to perforation of the bowel, which may require emergency surgery to repair, and bleeding) was verbally given to the patient. Educational information regarding lower intestinal endoscopy was given to the patient. Written instructions for how to complete the bowel prep using Miralax were provided. The importance of drinking ample fluids to avoid dehydration as a result of the prep emphasized.  The patient is scheduled for a Colonoscopy at [facility] on 09/01/16. They are aware to call the day before to get  their arrival time. Miralax prescription has been sent into the patient's pharmacy. The patient is aware of date and instructions.    This information has been scribed by Verlene Mayer, CMA    [name] 08/18/2016, 8:48 PM

## 2016-09-01 ENCOUNTER — Ambulatory Visit: Payer: Commercial Managed Care - HMO | Admitting: Anesthesiology

## 2016-09-01 ENCOUNTER — Encounter
Admission: RE | Disposition: A | Discharge status: Home / Self Care | Payer: Self-pay | Source: Ambulatory Visit | Attending: General Surgery

## 2016-09-01 ENCOUNTER — Ambulatory Visit
Admission: RE | Admit: 2016-09-01 | Discharge: 2016-09-01 | Disposition: A | Discharge status: Home / Self Care | Payer: Commercial Managed Care - HMO | Source: Ambulatory Visit | Attending: General Surgery | Admitting: General Surgery

## 2016-09-01 DIAGNOSIS — Z800 Family history of malignant neoplasm of digestive organs: Secondary | ICD-10-CM | POA: Diagnosis not present

## 2016-09-01 DIAGNOSIS — K5730 Diverticulosis of large intestine without perforation or abscess without bleeding: Secondary | ICD-10-CM | POA: Insufficient documentation

## 2016-09-01 DIAGNOSIS — I10 Essential (primary) hypertension: Secondary | ICD-10-CM | POA: Diagnosis not present

## 2016-09-01 DIAGNOSIS — K5732 Diverticulitis of large intestine without perforation or abscess without bleeding: Secondary | ICD-10-CM | POA: Diagnosis not present

## 2016-09-01 DIAGNOSIS — Z806 Family history of leukemia: Secondary | ICD-10-CM | POA: Diagnosis not present

## 2016-09-01 DIAGNOSIS — K5790 Diverticulosis of intestine, part unspecified, without perforation or abscess without bleeding: Secondary | ICD-10-CM | POA: Diagnosis not present

## 2016-09-01 DIAGNOSIS — E7800 Pure hypercholesterolemia, unspecified: Secondary | ICD-10-CM | POA: Diagnosis not present

## 2016-09-01 DIAGNOSIS — F418 Other specified anxiety disorders: Secondary | ICD-10-CM | POA: Diagnosis not present

## 2016-09-01 DIAGNOSIS — K219 Gastro-esophageal reflux disease without esophagitis: Secondary | ICD-10-CM | POA: Diagnosis not present

## 2016-09-01 DIAGNOSIS — Z1211 Encounter for screening for malignant neoplasm of colon: Secondary | ICD-10-CM | POA: Diagnosis not present

## 2016-09-01 HISTORY — PX: COLONOSCOPY WITH PROPOFOL: SHX5780

## 2016-09-01 SURGERY — COLONOSCOPY WITH PROPOFOL
Anesthesia: General

## 2016-09-01 MED ORDER — SODIUM CHLORIDE 0.9 % IV SOLN
INTRAVENOUS | Status: DC
  Administered 2016-09-01: 1000 mL via INTRAVENOUS

## 2016-09-01 MED ORDER — SODIUM CHLORIDE 0.9 % IV SOLN: INTRAVENOUS | Status: DC

## 2016-09-01 MED ORDER — PHENYLEPHRINE HCL 10 MG/ML IJ SOLN
INTRAMUSCULAR | Status: DC | PRN
  Administered 2016-09-01: 100 ug via INTRAVENOUS

## 2016-09-01 MED ORDER — SODIUM CHLORIDE 0.9 % IV SOLN
INTRAVENOUS | Status: DC
  Administered 2016-09-01: 14:00:00 via INTRAVENOUS

## 2016-09-01 MED ORDER — PROPOFOL 10 MG/ML IV BOLUS
INTRAVENOUS | Status: DC | PRN
  Administered 2016-09-01: 100 mg via INTRAVENOUS

## 2016-09-01 MED ORDER — FENTANYL CITRATE (PF) 100 MCG/2ML IJ SOLN
INTRAMUSCULAR | Status: DC | PRN
  Administered 2016-09-01: 50 ug via INTRAVENOUS

## 2016-09-01 MED ORDER — MIDAZOLAM HCL 5 MG/5ML IJ SOLN
INTRAMUSCULAR | Status: DC | PRN
  Administered 2016-09-01: 1 mg via INTRAVENOUS

## 2016-09-01 MED ORDER — PROPOFOL 500 MG/50ML IV EMUL
INTRAVENOUS | Status: DC | PRN
  Administered 2016-09-01: 150 ug/kg/min via INTRAVENOUS

## 2016-09-01 MED ORDER — LIDOCAINE 2% (20 MG/ML) 5 ML SYRINGE
INTRAMUSCULAR | Status: DC | PRN
  Administered 2016-09-01: 40 mg via INTRAVENOUS

## 2016-09-01 NOTE — Anesthesia Preprocedure Evaluation (Signed)
Anesthesia Evaluation  Patient identified by MRN, date of birth, ID band Patient awake    Reviewed: Allergy & Precautions, NPO status , Patient's Chart, lab work & pertinent test results  Airway Mallampati: II       Dental  (+) Teeth Intact   Pulmonary neg pulmonary ROS,    breath sounds clear to auscultation       Cardiovascular Exercise Tolerance: Good hypertension, Pt. on medications  Rhythm:Regular     Neuro/Psych Anxiety Depression    GI/Hepatic Neg liver ROS, GERD  Medicated,  Endo/Other  negative endocrine ROS  Renal/GU negative Renal ROS     Musculoskeletal   Abdominal   Peds negative pediatric ROS (+)  Hematology negative hematology ROS (+)   Anesthesia Other Findings   Reproductive/Obstetrics                             Anesthesia Physical Anesthesia Plan  ASA: II  Anesthesia Plan: General   Post-op Pain Management:    Induction: Intravenous  Airway Management Planned: Natural Airway and Nasal Cannula  Additional Equipment:   Intra-op Plan:   Post-operative Plan:   Informed Consent: I have reviewed the patients History and Physical, chart, labs and discussed the procedure including the risks, benefits and alternatives for the proposed anesthesia with the patient or authorized representative who has indicated his/her understanding and acceptance.     Plan Discussed with: CRNA  Anesthesia Plan Comments:         Anesthesia Quick Evaluation

## 2016-09-01 NOTE — Op Note (Addendum)
[facility] Gastroenterology Patient Name: [name] Procedure Date: 09/01/2016 2:11 PM MRN: [id number] Account #: [id number] Date of Birth: 11/20/1940 Admit Type: Outpatient Age: 75 Room: [facility] ENDO ROOM 4 Gender: Female Note Status: Finalized Procedure:            Colonoscopy Indications:          Family history of colon cancer in a first-degree                        relative Providers:            Robert Bellow, MD Referring MD:         Otila Back. Manuella Ghazi (Referring MD) Medicines:            Monitored Anesthesia Care Complications:        No immediate complications. Procedure:            Pre-Anesthesia Assessment:                       - Prior to the procedure, a History and Physical was                        performed, and patient medications, allergies and                        sensitivities were reviewed. The patient's tolerance of                        previous anesthesia was reviewed.                       - The risks and benefits of the procedure and the                        sedation options and risks were discussed with the                        patient. All questions were answered and informed                        consent was obtained.                       After obtaining informed consent, the colonoscope was                        passed under direct vision. Throughout the procedure,                        the patient's blood pressure, pulse, and oxygen                        saturations were monitored continuously. The                        Colonoscope was introduced through the anus and                        advanced to the the terminal ileum. The colonoscopy was  performed without difficulty. The patient tolerated the                        procedure well. The quality of the bowel preparation                        was excellent. Findings:      A few medium-mouthed diverticula were found in the sigmoid  colon.      The retroflexed view of the distal rectum and anal verge was normal and       showed no anal or rectal abnormalities. Impression:           - Diverticulosis in the sigmoid colon.                       - The distal rectum and anal verge are normal on                        retroflexion view.                       - No specimens collected. Recommendation:       - Repeat colonoscopy in 5 years for surveillance. Procedure Code(s):    --- Professional ---                       [phone], Colonoscopy, flexible; diagnostic, including                        collection of specimen(s) by brushing or washing, when                        performed (separate procedure) Diagnosis Code(s):    --- Professional ---                       Z80.0, Family history of malignant neoplasm of                        digestive organs                       K57.30, Diverticulosis of large intestine without                        perforation or abscess without bleeding CPT copyright 2016 American Medical Association. All rights reserved. The codes documented in this report are preliminary and upon coder review may  be revised to meet current compliance requirements. Robert Bellow, MD 09/01/2016 2:36:36 PM This report has been signed electronically. Number of Addenda: 0 Note Initiated On: 09/01/2016 2:11 PM Scope Withdrawal Time: 0 hours 8 minutes 38 seconds  Total Procedure Duration: 0 hours 13 minutes 30 seconds       [facility]

## 2016-09-01 NOTE — Transfer of Care (Signed)
Immediate Anesthesia Transfer of Care Note  Patient: [name]  Procedure(s) Performed: Procedure(s): COLONOSCOPY WITH PROPOFOL (N/A)  Patient Location: PACU and Endoscopy Unit  Anesthesia Type:General  Level of Consciousness: awake, oriented and patient cooperative  Airway & Oxygen Therapy: Patient Spontanous Breathing and Patient connected to nasal cannula oxygen  Post-op Assessment: Report given to RN and Post -op Vital signs reviewed and stable  Post vital signs: Reviewed and stable  Last Vitals:  Vitals:   09/01/16 1332  BP: (!) 155/80  Pulse: 93  Resp: 18  Temp: 36.3 C    Last Pain:  Vitals:   09/01/16 1332  TempSrc: Tympanic         Complications: No apparent anesthesia complications

## 2016-09-01 NOTE — Anesthesia Postprocedure Evaluation (Signed)
Anesthesia Post Note  Patient: [name]  Procedure(s) Performed: Procedure(s) (LRB): COLONOSCOPY WITH PROPOFOL (N/A)  Patient location during evaluation: PACU Anesthesia Type: General Level of consciousness: awake Pain management: pain level controlled Vital Signs Assessment: post-procedure vital signs reviewed and stable Respiratory status: nonlabored ventilation Cardiovascular status: stable Anesthetic complications: no    Last Vitals:  Vitals:   09/01/16 1440 09/01/16 1441  BP: (!) 146/84 (!) 146/84  Pulse: 91 91  Resp: 10 10  Temp: 36.2 C 36.6 C    Last Pain:  Vitals:   09/01/16 1440  TempSrc: Tympanic                 VAN STAVEREN,Omnia Dollinger

## 2016-09-02 NOTE — H&P (Signed)
[name] [id number] 01/22/1941     HPI: Family history of colon cancer.  For screening exam. Tolerated prep well.   No prescriptions prior to admission.   Allergies  Allergen Reactions  . Codeine   . Sulfa Antibiotics    Past Medical History:  Diagnosis Date  . Anxiety   . Depression   . GERD (gastroesophageal reflux disease)   . Hypercholesteremia   . Hypertension    Past Surgical History:  Procedure Laterality Date  . COLONOSCOPY  2012   [facility]  . KNEE SURGERY Left   . SUBCLAVIAN ANGIOGRAM    . UPPER GI ENDOSCOPY  2012   Social History   Social History  . Marital status: Married    Spouse name: N/A  . Number of children: N/A  . Years of education: N/A   Occupational History  . Not on file.   Social History Main Topics  . Smoking status: Never Smoker  . Smokeless tobacco: Never Used  . Alcohol use No  . Drug use: No  . Sexual activity: Not Currently   Other Topics Concern  . Not on file   Social History Narrative  . No narrative on file   Social History   Social History Narrative  . No narrative on file     ROS: Negative.     PE: HEENT: Negative. Lungs: Clear. Cardio: RR. Robert Bellow 09/02/2016   Assessment/Plan:  Proceed with planned endoscopy.

## 2016-09-05 ENCOUNTER — Encounter: Payer: Self-pay | Admitting: General Surgery

## 2016-09-17 DIAGNOSIS — Z01419 Encounter for gynecological examination (general) (routine) without abnormal findings: Secondary | ICD-10-CM | POA: Diagnosis not present

## 2016-09-17 DIAGNOSIS — Z6828 Body mass index (BMI) 28.0-28.9, adult: Secondary | ICD-10-CM | POA: Diagnosis not present

## 2016-10-20 ENCOUNTER — Encounter: Payer: Self-pay | Admitting: Family Medicine

## 2016-10-20 ENCOUNTER — Telehealth: Payer: Self-pay

## 2016-10-20 ENCOUNTER — Ambulatory Visit (INDEPENDENT_AMBULATORY_CARE_PROVIDER_SITE_OTHER): Payer: Medicare HMO | Admitting: Family Medicine

## 2016-10-20 VITALS — BP 132/74 | HR 97 | Temp 99.0°F | Resp 17 | Ht 63.0 in | Wt 158.3 lb

## 2016-10-20 DIAGNOSIS — L309 Dermatitis, unspecified: Secondary | ICD-10-CM | POA: Diagnosis not present

## 2016-10-20 DIAGNOSIS — K219 Gastro-esophageal reflux disease without esophagitis: Secondary | ICD-10-CM | POA: Insufficient documentation

## 2016-10-20 DIAGNOSIS — E785 Hyperlipidemia, unspecified: Secondary | ICD-10-CM | POA: Diagnosis not present

## 2016-10-20 DIAGNOSIS — Z23 Encounter for immunization: Secondary | ICD-10-CM

## 2016-10-20 DIAGNOSIS — F3342 Major depressive disorder, recurrent, in full remission: Secondary | ICD-10-CM | POA: Diagnosis not present

## 2016-10-20 MED ORDER — MOMETASONE FUROATE 0.1 % EX OINT: TOPICAL_OINTMENT | Freq: Every day | CUTANEOUS | 0 refills | Status: DC

## 2016-10-20 MED ORDER — SERTRALINE HCL 25 MG PO TABS: 25.0000 mg | ORAL_TABLET | Freq: Every day | ORAL | 0 refills | Status: DC

## 2016-10-20 MED ORDER — OMEPRAZOLE 20 MG PO CPDR: DELAYED_RELEASE_CAPSULE | ORAL | 0 refills | Status: DC

## 2016-10-20 NOTE — Progress Notes (Signed)
Name: [name]   MRN: [id number]    DOB: 05-28-1941   Date:10/20/2016       Progress Note  Subjective  Chief Complaint  Chief Complaint  Patient presents with  . Follow-up    3 mo  . Medication Refill    Hyperlipidemia  This is a chronic problem. The problem is uncontrolled. Recent lipid tests were reviewed and are high. Pertinent negatives include no chest pain. Treatments tried: not taking Pitavastatin becasue it was expesnsive, had side effects to Atorvastatin and Rosuvatstain. Risk factors for coronary artery disease include dyslipidemia and hypertension.  Rash  This is a recurrent problem. The current episode started more than 1 month ago. The affected locations include the neck. The rash is characterized by dryness and itchiness. Pertinent negatives include no cough, fever or sore throat. Past treatments include topical steroids.  Gastroesophageal Reflux  She reports no abdominal pain, no chest pain, no coughing, no dysphagia, no heartburn, no nausea or no sore throat. This is a chronic problem. The problem has been unchanged. She has tried a PPI for the symptoms.     Past Medical History:  Diagnosis Date  . Anxiety   . Depression   . GERD (gastroesophageal reflux disease)   . Hypercholesteremia   . Hypertension     Past Surgical History:  Procedure Laterality Date  . COLONOSCOPY  2012   [facility]  . COLONOSCOPY WITH PROPOFOL N/A 09/01/2016   Procedure: COLONOSCOPY WITH PROPOFOL;  Surgeon: Robert Bellow, MD;  Location: [facility] ENDOSCOPY;  Service: Endoscopy;  Laterality: N/A;  . KNEE SURGERY Left   . SUBCLAVIAN ANGIOGRAM    . UPPER GI ENDOSCOPY  2012    Family History  Problem Relation Age of Onset  . Cancer Mother 56    colon  . Alzheimer's disease Mother   . Hypertension Mother   . Cancer Brother     prostate  . Lymphoma Son   . Cancer Son 8    lymphoma  . Hypertension Father   . Cerebral aneurysm Father   . Diabetes Sister   . Depression  Sister   . Hypertension Sister   . Depression Sister   . Cancer Brother   . Gallbladder disease Brother   . Prostate cancer Brother     Social History   Social History  . Marital status: Married    Spouse name: N/A  . Number of children: N/A  . Years of education: N/A   Occupational History  . Not on file.   Social History Main Topics  . Smoking status: Never Smoker  . Smokeless tobacco: Never Used  . Alcohol use No  . Drug use: No  . Sexual activity: Not Currently   Other Topics Concern  . Not on file   Social History Narrative  . No narrative on file     Current Outpatient Prescriptions:  .  aspirin 81 MG tablet, Take 1 tablet by mouth daily., Disp: , Rfl:  .  Light Mineral Oil-Mineral Oil (RETAINE MGD OP), Apply to eye., Disp: , Rfl:  .  losartan (COZAAR) 50 MG tablet, Take 1 tablet (50 mg total) by mouth daily., Disp: 30 tablet, Rfl: 5 .  LUMIGAN 0.01 % SOLN, Place 1 drop into both eyes at bedtime., Disp: , Rfl: 1 .  omeprazole (PRILOSEC) 20 MG capsule, TAKE 1 CAPSULE (20 MG TOTAL) BY MOUTH 2 (TWO) TIMES DAILY., Disp: 180 capsule, Rfl: 0 .  sertraline (ZOLOFT) 25 MG tablet, Take 1 tablet (  25 mg total) by mouth daily., Disp: 90 tablet, Rfl: 0  Allergies  Allergen Reactions  . Codeine   . Sulfa Antibiotics      Review of Systems  Constitutional: Negative for chills, fever and malaise/fatigue.  HENT: Negative for sore throat.   Respiratory: Negative for cough.   Cardiovascular: Negative for chest pain.  Gastrointestinal: Negative for abdominal pain, dysphagia, heartburn and nausea.  Skin: Positive for itching and rash.     Objective  Vitals:   10/20/16 1429  BP: 132/74  Pulse: 97  Resp: 17  Temp: 99 F (37.2 C)  TempSrc: Oral  SpO2: 96%  Weight: 158 lb 4.8 oz (71.8 kg)  Height: 5\' 3"  (1.6 m)    Physical Exam  Constitutional: She is oriented to person, place, and time and well-developed, well-nourished, and in no distress.  HENT:  Head:  Normocephalic and atraumatic.  Cardiovascular: Normal rate, regular rhythm and normal heart sounds.   No murmur heard. Pulmonary/Chest: Effort normal and breath sounds normal. She has no wheezes.  Abdominal: Soft. Bowel sounds are normal. There is no tenderness.  Neurological: She is alert and oriented to person, place, and time.  Skin: Rash noted. Rash is macular.  Macular erythematous pruritic well defined rash on the anterior neck consistent with dermatitis.  Psychiatric: Mood, memory, affect and judgment normal.  Nursing note and vitals reviewed.   Assessment & Plan  1. Dermatitis We'll start on topical corticosteroid, advised to not apply the preparation on the face - mometasone (ELOCON) 0.1 % ointment; Apply topically daily.  Dispense: 45 g; Refill: 0  2. Hyperlipidemia LDL goal <100 Now off pravastatin, could not afford the cost. Obtain FLP and restart on appropriate statin - Lipid Profile  3. Gastroesophageal reflux disease, esophagitis presence not specified  - omeprazole (PRILOSEC) 20 MG capsule; TAKE 1 CAPSULE (20 MG TOTAL) BY MOUTH 2 (TWO) TIMES DAILY.  Dispense: 180 capsule; Refill: 0  4. Recurrent major depressive disorder, in full remission [facility]) Patient was being seen by psychiatry, however no longer wishes to see the psychiatrist and wants [place] to prescribe sertraline. We will take over management and refills for sertraline provided - sertraline (ZOLOFT) 25 MG tablet; Take 1 tablet (25 mg total) by mouth daily.  Dispense: 90 tablet; Refill: 0  5. Need for influenza vaccination  - Flu vaccine HIGH DOSE PF (Fluzone High dose)   Arilynn Blakeney Asad A. [facility] 10/20/2016 2:53 PM

## 2016-10-20 NOTE — Telephone Encounter (Signed)
Per the request of Dr. Rochel Brome, I contacted this patient to let her know that she has been scheduled for a bone density scan on 12/01/15 @ 9am at the [facility], but there was no answer. A message was left with this information and for her to give [place] a call if she had any questions.

## 2016-10-21 ENCOUNTER — Telehealth: Payer: Self-pay | Admitting: Family Medicine

## 2016-10-21 DIAGNOSIS — J0110 Acute frontal sinusitis, unspecified: Secondary | ICD-10-CM

## 2016-10-21 MED ORDER — AMOXICILLIN-POT CLAVULANATE 875-125 MG PO TABS: 1.0000 | ORAL_TABLET | Freq: Two times a day (BID) | ORAL | 0 refills | Status: DC

## 2016-10-21 NOTE — Telephone Encounter (Signed)
Prescription for amoxicillin-clavulanate 875-125 mg 1 tablet twice a day 10 days has been sent to patient's pharmacy

## 2016-10-21 NOTE — Telephone Encounter (Signed)
Pt was in yesterday and was supposed to have something called in for her sinuses. Please advise.

## 2016-10-21 NOTE — Telephone Encounter (Signed)
Pt.notified

## 2016-10-22 LAB — LIPID PANEL
Cholesterol: 193 mg/dL (ref <200)
HDL: 61 mg/dL (ref >50)
LDL Cholesterol: 114 mg/dL — ABNORMAL HIGH (ref <100)
Total CHOL/HDL Ratio: 3.2 Ratio (ref <5.0)
Triglycerides: 91 mg/dL (ref <150)
VLDL: 18 mg/dL (ref <30)

## 2016-10-26 ENCOUNTER — Ambulatory Visit: Payer: Self-pay | Admitting: Family Medicine

## 2016-11-04 DIAGNOSIS — H401132 Primary open-angle glaucoma, bilateral, moderate stage: Secondary | ICD-10-CM | POA: Diagnosis not present

## 2016-11-10 ENCOUNTER — Ambulatory Visit: Payer: Self-pay | Admitting: Family Medicine

## 2016-11-19 ENCOUNTER — Ambulatory Visit: Payer: Self-pay | Admitting: Family Medicine

## 2016-11-30 ENCOUNTER — Ambulatory Visit: Payer: Commercial Managed Care - HMO

## 2016-12-01 ENCOUNTER — Encounter: Payer: Self-pay | Admitting: Family Medicine

## 2016-12-01 ENCOUNTER — Ambulatory Visit (INDEPENDENT_AMBULATORY_CARE_PROVIDER_SITE_OTHER): Payer: Self-pay | Admitting: Family Medicine

## 2016-12-01 VITALS — BP 127/70 | HR 96 | Temp 98.2°F | Resp 16 | Ht 63.0 in | Wt 158.4 lb

## 2016-12-01 DIAGNOSIS — E785 Hyperlipidemia, unspecified: Secondary | ICD-10-CM

## 2016-12-01 MED ORDER — PRAVASTATIN SODIUM 20 MG PO TABS: 20.0000 mg | ORAL_TABLET | Freq: Every day | ORAL | 0 refills | Status: DC

## 2016-12-01 NOTE — Progress Notes (Signed)
Name: [name]   MRN: [id number]    DOB: 04-09-1941   Date:12/01/2016       Progress Note  Subjective  Chief Complaint  Chief Complaint  Patient presents with  . Follow-up    Discuss statin therapy    Hyperlipidemia  This is a recurrent problem. The problem is uncontrolled. Recent lipid tests were reviewed and are high. She is currently on no antihyperlipidemic treatment (Has been on Atorvastatin 20 mg which caused her to have GI side effects and Rosuvastatin was too expensive,).     Past Medical History:  Diagnosis Date  . Anxiety   . Depression   . GERD (gastroesophageal reflux disease)   . Hypercholesteremia   . Hypertension     Past Surgical History:  Procedure Laterality Date  . COLONOSCOPY  2012   [facility]  . COLONOSCOPY WITH PROPOFOL N/A 09/01/2016   Procedure: COLONOSCOPY WITH PROPOFOL;  Surgeon: Robert Bellow, MD;  Location: [facility] ENDOSCOPY;  Service: Endoscopy;  Laterality: N/A;  . KNEE SURGERY Left   . SUBCLAVIAN ANGIOGRAM    . UPPER GI ENDOSCOPY  2012    Family History  Problem Relation Age of Onset  . Cancer Mother 20    colon  . Alzheimer's disease Mother   . Hypertension Mother   . Cancer Brother     prostate  . Lymphoma Son   . Cancer Son 34    lymphoma  . Hypertension Father   . Cerebral aneurysm Father   . Diabetes Sister   . Depression Sister   . Hypertension Sister   . Depression Sister   . Cancer Brother   . Gallbladder disease Brother   . Prostate cancer Brother     Social History   Social History  . Marital status: Married    Spouse name: N/A  . Number of children: N/A  . Years of education: N/A   Occupational History  . Not on file.   Social History Main Topics  . Smoking status: Never Smoker  . Smokeless tobacco: Never Used  . Alcohol use No  . Drug use: No  . Sexual activity: Not Currently   Other Topics Concern  . Not on file   Social History Narrative  . No narrative on file     Current  Outpatient Prescriptions:  .  aspirin 81 MG tablet, Take 1 tablet by mouth daily., Disp: , Rfl:  .  Light Mineral Oil-Mineral Oil (RETAINE MGD OP), Apply to eye., Disp: , Rfl:  .  losartan (COZAAR) 50 MG tablet, Take 1 tablet (50 mg total) by mouth daily., Disp: 30 tablet, Rfl: 5 .  LUMIGAN 0.01 % SOLN, Place 1 drop into both eyes at bedtime., Disp: , Rfl: 1 .  mometasone (ELOCON) 0.1 % ointment, Apply topically daily., Disp: 45 g, Rfl: 0 .  omeprazole (PRILOSEC) 20 MG capsule, TAKE 1 CAPSULE (20 MG TOTAL) BY MOUTH 2 (TWO) TIMES DAILY., Disp: 180 capsule, Rfl: 0 .  sertraline (ZOLOFT) 25 MG tablet, Take 1 tablet (25 mg total) by mouth daily., Disp: 90 tablet, Rfl: 0  Allergies  Allergen Reactions  . Codeine   . Sulfa Antibiotics      ROS  Please see HPI for complete discussion of ROS  Objective  Vitals:   12/01/16 1431  BP: 127/70  Pulse: 96  Resp: 16  Temp: 98.2 F (36.8 C)  TempSrc: Oral  SpO2: 97%  Weight: 158 lb 6.4 oz (71.8 kg)  Height: 5\' 3"  (1.6  m)    Physical Exam  Constitutional: She is oriented to person, place, and time and well-developed, well-nourished, and in no distress.  Cardiovascular: Normal rate, regular rhythm and normal heart sounds.   No murmur heard. Pulmonary/Chest: Effort normal and breath sounds normal. She has no wheezes.  Neurological: She is alert and oriented to person, place, and time.  Psychiatric: Mood, memory, affect and judgment normal.  Nursing note and vitals reviewed.      Recent Results (from the past 2160 hour(s))  Lipid Profile     Status: Abnormal   Collection Time: 10/20/16  3:07 PM  Result Value Ref Range   Cholesterol 193 <200 mg/dL   Triglycerides 91 <150 mg/dL   HDL 61 >50 mg/dL   Total CHOL/HDL Ratio 3.2 <5.0 Ratio   VLDL 18 <30 mg/dL   LDL Cholesterol 114 (H) <100 mg/dL     Assessment & Plan  1. Dyslipidemia Started o Pravastatin for management of elevated lipids. - pravastatin (PRAVACHOL) 20 MG tablet;  Take 1 tablet (20 mg total) by mouth daily.  Dispense: 30 tablet; Refill: 0   Aundrey Elahi Asad A. [facility] 12/01/2016 2:39 PM

## 2016-12-29 ENCOUNTER — Telehealth: Payer: Self-pay | Admitting: Family Medicine

## 2016-12-30 ENCOUNTER — Ambulatory Visit
Admission: RE | Admit: 2016-12-30 | Discharge: 2016-12-30 | Disposition: A | Discharge status: Home / Self Care | Payer: Commercial Managed Care - HMO | Source: Ambulatory Visit | Attending: Family Medicine | Admitting: Family Medicine

## 2016-12-30 DIAGNOSIS — M810 Age-related osteoporosis without current pathological fracture: Secondary | ICD-10-CM | POA: Diagnosis not present

## 2016-12-30 DIAGNOSIS — Z780 Asymptomatic menopausal state: Secondary | ICD-10-CM | POA: Insufficient documentation

## 2017-01-02 ENCOUNTER — Other Ambulatory Visit: Payer: Self-pay | Admitting: Family Medicine

## 2017-01-02 DIAGNOSIS — E785 Hyperlipidemia, unspecified: Secondary | ICD-10-CM

## 2017-01-11 ENCOUNTER — Other Ambulatory Visit: Payer: Self-pay | Admitting: Family Medicine

## 2017-01-11 DIAGNOSIS — I10 Essential (primary) hypertension: Secondary | ICD-10-CM

## 2017-01-12 DIAGNOSIS — N8182 Incompetence or weakening of pubocervical tissue: Secondary | ICD-10-CM | POA: Diagnosis not present

## 2017-01-13 ENCOUNTER — Encounter: Payer: Self-pay | Admitting: Family Medicine

## 2017-01-13 ENCOUNTER — Ambulatory Visit (INDEPENDENT_AMBULATORY_CARE_PROVIDER_SITE_OTHER): Payer: Medicare HMO | Admitting: Family Medicine

## 2017-01-13 VITALS — BP 124/76 | HR 100 | Temp 98.4°F | Resp 16 | Ht 63.0 in | Wt 142.7 lb

## 2017-01-13 DIAGNOSIS — M810 Age-related osteoporosis without current pathological fracture: Secondary | ICD-10-CM

## 2017-01-13 MED ORDER — ALENDRONATE SODIUM 70 MG PO TABS: 70.0000 mg | ORAL_TABLET | ORAL | 3 refills | Status: DC

## 2017-01-13 NOTE — Progress Notes (Signed)
Name: [name]   MRN: [id number]    DOB: 30-Jan-1941   Date:01/13/2017       Progress Note  Subjective  Chief Complaint  Chief Complaint  Patient presents with  . Follow-up    Bone Density results    HPI  Osteoporosis: Bone density showed T-score of -3.1 at right femur, she has no history of osteoporosis, has not been on any medication before. She has no pain in hips or lower back. She has no history of recent dental procedures or jaw pain.    Past Medical History:  Diagnosis Date  . Anxiety   . Depression   . GERD (gastroesophageal reflux disease)   . Hypercholesteremia   . Hypertension     Past Surgical History:  Procedure Laterality Date  . COLONOSCOPY  2012   [facility]  . COLONOSCOPY WITH PROPOFOL N/A 09/01/2016   Procedure: COLONOSCOPY WITH PROPOFOL;  Surgeon: Robert Bellow, MD;  Location: [facility] ENDOSCOPY;  Service: Endoscopy;  Laterality: N/A;  . KNEE SURGERY Left   . SUBCLAVIAN ANGIOGRAM    . UPPER GI ENDOSCOPY  2012    Family History  Problem Relation Age of Onset  . Cancer Mother 63    colon  . Alzheimer's disease Mother   . Hypertension Mother   . Cancer Brother     prostate  . Lymphoma Son   . Cancer Son 84    lymphoma  . Hypertension Father   . Cerebral aneurysm Father   . Diabetes Sister   . Depression Sister   . Hypertension Sister   . Depression Sister   . Cancer Brother   . Gallbladder disease Brother   . Prostate cancer Brother     Social History   Social History  . Marital status: Widowed    Spouse name: N/A  . Number of children: N/A  . Years of education: N/A   Occupational History  . Not on file.   Social History Main Topics  . Smoking status: Never Smoker  . Smokeless tobacco: Never Used  . Alcohol use No  . Drug use: No  . Sexual activity: Not Currently   Other Topics Concern  . Not on file   Social History Narrative  . No narrative on file     Current Outpatient Prescriptions:  .  aspirin 81 MG  tablet, Take 1 tablet by mouth daily., Disp: , Rfl:  .  Light Mineral Oil-Mineral Oil (RETAINE MGD OP), Apply to eye., Disp: , Rfl:  .  losartan (COZAAR) 50 MG tablet, TAKE 1 TABLET (50 MG TOTAL) BY MOUTH DAILY., Disp: 30 tablet, Rfl: 5 .  LUMIGAN 0.01 % SOLN, Place 1 drop into both eyes at bedtime., Disp: , Rfl: 1 .  mometasone (ELOCON) 0.1 % ointment, Apply topically daily., Disp: 45 g, Rfl: 0 .  omeprazole (PRILOSEC) 20 MG capsule, TAKE 1 CAPSULE (20 MG TOTAL) BY MOUTH 2 (TWO) TIMES DAILY., Disp: 180 capsule, Rfl: 0 .  pravastatin (PRAVACHOL) 20 MG tablet, TAKE 1 TABLET (20 MG TOTAL) BY MOUTH DAILY., Disp: 90 tablet, Rfl: 0 .  sertraline (ZOLOFT) 25 MG tablet, Take 1 tablet (25 mg total) by mouth daily., Disp: 90 tablet, Rfl: 0  Allergies  Allergen Reactions  . Codeine   . Sulfa Antibiotics      ROS  Please see HPI for complete discussion of ROS.  Objective  Vitals:   01/13/17 1433  BP: 124/76  Pulse: 100  Resp: 16  Temp: 98.4 F (36.9  C)  TempSrc: Oral  SpO2: 93%  Weight: 142 lb 11.2 oz (64.7 kg)  Height: 5\' 3"  (1.6 m)    Physical Exam  Constitutional: She is oriented to person, place, and time and well-developed, well-nourished, and in no distress.  HENT:  Head: Normocephalic and atraumatic.  Cardiovascular: Normal rate, regular rhythm and normal heart sounds.   No murmur heard. Pulmonary/Chest: Effort normal and breath sounds normal. She has no wheezes.  Neurological: She is alert and oriented to person, place, and time.  Psychiatric: Mood, memory, affect and judgment normal.  Nursing note and vitals reviewed.    Recent Results (from the past 2160 hour(s))  Lipid Profile     Status: Abnormal   Collection Time: 10/20/16  3:07 PM  Result Value Ref Range   Cholesterol 193 <200 mg/dL   Triglycerides 91 <150 mg/dL   HDL 61 >50 mg/dL   Total CHOL/HDL Ratio 3.2 <5.0 Ratio   VLDL 18 <30 mg/dL   LDL Cholesterol 114 (H) <100 mg/dL     Assessment & Plan  1.  Age-related osteoporosis without current pathological fracture Started on Alendronate for treatment of Osteoporosis, repeat DEXA Scan in 12 months. - alendronate (FOSAMAX) 70 MG tablet; Take 1 tablet (70 mg total) by mouth once a week. Take with a full glass of water on an empty stomach.  Dispense: 12 tablet; Refill: 3   Catherine Oak Asad A. [facility] 01/13/2017 2:41 PM

## 2017-01-18 ENCOUNTER — Ambulatory Visit: Payer: Self-pay | Admitting: Family Medicine

## 2017-01-19 ENCOUNTER — Telehealth: Payer: Self-pay | Admitting: Family Medicine

## 2017-01-19 NOTE — Telephone Encounter (Signed)
PT IS NEEDING 1 MORE MONTH OF HER SERTRALINE ( ZOLOFT) . SHE SAID THAT SHE WANTS TO COME OFF OF IT AND WANTS SOMEONE TO CALL HER AND LET HER KNOW HOW AND WHAT SHE NEEDS TO DO IN ORDER TO COME OFF OF IT.[place]

## 2017-01-20 ENCOUNTER — Other Ambulatory Visit: Payer: Self-pay | Admitting: Family Medicine

## 2017-01-20 DIAGNOSIS — F3342 Major depressive disorder, recurrent, in full remission: Secondary | ICD-10-CM

## 2017-01-20 MED ORDER — SERTRALINE HCL 25 MG PO TABS: 12.5000 mg | ORAL_TABLET | Freq: Every day | ORAL | 0 refills | Status: DC

## 2017-01-20 NOTE — Telephone Encounter (Signed)
Returned call and left a voice message, if patient wants to discontinue sertraline, she may take half a tablet of 25 mg for a week and then stop.

## 2017-02-10 DIAGNOSIS — J029 Acute pharyngitis, unspecified: Secondary | ICD-10-CM | POA: Diagnosis not present

## 2017-02-10 DIAGNOSIS — J019 Acute sinusitis, unspecified: Secondary | ICD-10-CM | POA: Diagnosis not present

## 2017-03-01 ENCOUNTER — Ambulatory Visit (INDEPENDENT_AMBULATORY_CARE_PROVIDER_SITE_OTHER): Payer: Medicare HMO | Admitting: Family Medicine

## 2017-03-01 ENCOUNTER — Ambulatory Visit: Payer: Medicare HMO

## 2017-03-01 VITALS — BP 122/62 | HR 88 | Temp 98.2°F | Ht 63.0 in | Wt 143.6 lb

## 2017-03-01 DIAGNOSIS — Z0000 Encounter for general adult medical examination without abnormal findings: Secondary | ICD-10-CM

## 2017-03-01 DIAGNOSIS — N898 Other specified noninflammatory disorders of vagina: Secondary | ICD-10-CM | POA: Diagnosis not present

## 2017-03-01 DIAGNOSIS — Z23 Encounter for immunization: Secondary | ICD-10-CM

## 2017-03-01 DIAGNOSIS — R300 Dysuria: Secondary | ICD-10-CM | POA: Diagnosis not present

## 2017-03-01 DIAGNOSIS — Z1239 Encounter for other screening for malignant neoplasm of breast: Secondary | ICD-10-CM

## 2017-03-01 DIAGNOSIS — Z1231 Encounter for screening mammogram for malignant neoplasm of breast: Secondary | ICD-10-CM | POA: Diagnosis not present

## 2017-03-01 DIAGNOSIS — E559 Vitamin D deficiency, unspecified: Secondary | ICD-10-CM | POA: Diagnosis not present

## 2017-03-01 LAB — CBC WITH DIFFERENTIAL/PLATELET
Basophils Absolute: 0 cells/uL (ref 0–200)
Basophils Relative: 0 %
Eosinophils Absolute: 160 cells/uL (ref 15–500)
Eosinophils Relative: 2 %
HCT: 40.8 % (ref 35.0–45.0)
Hemoglobin: 13.3 g/dL (ref 11.7–15.5)
Lymphocytes Relative: 26 %
Lymphs Abs: 2080 cells/uL (ref 850–3900)
MCH: 28.4 pg (ref 27.0–33.0)
MCHC: 32.6 g/dL (ref 32.0–36.0)
MCV: 87.2 fL (ref 80.0–100.0)
MPV: 10.1 fL (ref 7.5–12.5)
Monocytes Absolute: 480 cells/uL (ref 200–950)
Monocytes Relative: 6 %
Neutro Abs: 5280 cells/uL (ref 1500–7800)
Neutrophils Relative %: 66 %
Platelets: 253 10*3/uL (ref 140–400)
RBC: 4.68 MIL/uL (ref 3.80–5.10)
RDW: 14.3 % (ref 11.0–15.0)
WBC: 8 10*3/uL (ref 3.8–10.8)

## 2017-03-01 NOTE — Patient Instructions (Signed)
[name] , Thank you for taking time to come for your Medicare Wellness Visit. I appreciate your ongoing commitment to your health goals. Please review the following plan we discussed and let me know if I can assist you in the future.   Screening recommendations/referrals: Colonoscopy: done 09/01/16 Mammogram: done 12/12/15 Bone Density: done 12/30/16 Recommended yearly ophthalmology/optometry visit for glaucoma screening and checkup Recommended yearly dental visit for hygiene and checkup  Vaccinations: Influenza vaccine: done 10/20/16 Pneumococcal vaccine: completed series Tdap vaccine: done 2013 (per pt) Shingles vaccine: declined    Advanced directives: requested copy  Next appointment: 04/08/17 @ 1:20 PM   Preventive Care 10 Years and Older, Female Preventive care refers to lifestyle choices and visits with your health care provider that can promote health and wellness. What does preventive care include?  A yearly physical exam. This is also called an annual well check.  Dental exams once or twice a year.  Routine eye exams. Ask your health care provider how often you should have your eyes checked.  Personal lifestyle choices, including:  Daily care of your teeth and gums.  Regular physical activity.  Eating a healthy diet.  Avoiding tobacco and drug use.  Limiting alcohol use.  Practicing safe sex.  Taking low-dose aspirin every day.  Taking vitamin and mineral supplements as recommended by your health care provider. What happens during an annual well check? The services and screenings done by your health care provider during your annual well check will depend on your age, overall health, lifestyle risk factors, and family history of disease. Counseling  Your health care provider may ask you questions about your:  Alcohol use.  Tobacco use.  Drug use.  Emotional well-being.  Home and relationship well-being.  Sexual activity.  Eating  habits.  History of falls.  Memory and ability to understand (cognition).  Work and work Statistician.  Reproductive health. Screening  You may have the following tests or measurements:  Height, weight, and BMI.  Blood pressure.  Lipid and cholesterol levels. These may be checked every 5 years, or more frequently if you are over 62 years old.  Skin check.  Lung cancer screening. You may have this screening every year starting at age 6 if you have a 30-pack-year history of smoking and currently smoke or have quit within the past 15 years.  Fecal occult blood test (FOBT) of the stool. You may have this test every year starting at age 32.  Flexible sigmoidoscopy or colonoscopy. You may have a sigmoidoscopy every 5 years or a colonoscopy every 10 years starting at age 16.  Hepatitis C blood test.  Hepatitis B blood test.  Sexually transmitted disease (STD) testing.  Diabetes screening. This is done by checking your blood sugar (glucose) after you have not eaten for a while (fasting). You may have this done every 1-3 years.  Bone density scan. This is done to screen for osteoporosis. You may have this done starting at age 32.  Mammogram. This may be done every 1-2 years. Talk to your health care provider about how often you should have regular mammograms. Talk with your health care provider about your test results, treatment options, and if necessary, the need for more tests. Vaccines  Your health care provider may recommend certain vaccines, such as:  Influenza vaccine. This is recommended every year.  Tetanus, diphtheria, and acellular pertussis (Tdap, Td) vaccine. You may need a Td booster every 10 years.  Zoster vaccine. You may need this after age  60.  Pneumococcal 13-valent conjugate (PCV13) vaccine. One dose is recommended after age 24.  Pneumococcal polysaccharide (PPSV23) vaccine. One dose is recommended after age 66. Talk to your health care provider about which  screenings and vaccines you need and how often you need them. This information is not intended to replace advice given to you by your health care provider. Make sure you discuss any questions you have with your health care provider. Document Released: 11/29/2015 Document Revised: 07/22/2016 Document Reviewed: 09/03/2015 Elsevier Interactive Patient Education  2017 [place] Prevention in the Home Falls can cause injuries. They can happen to people of all ages. There are many things you can do to make your home safe and to help prevent falls. What can I do on the outside of my home?  Regularly fix the edges of walkways and driveways and fix any cracks.  Remove anything that might make you trip as you walk through a door, such as a raised step or threshold.  Trim any bushes or trees on the path to your home.  Use bright outdoor lighting.  Clear any walking paths of anything that might make someone trip, such as rocks or tools.  Regularly check to see if handrails are loose or broken. Make sure that both sides of any steps have handrails.  Any raised decks and porches should have guardrails on the edges.  Have any leaves, snow, or ice cleared regularly.  Use sand or salt on walking paths during winter.  Clean up any spills in your garage right away. This includes oil or grease spills. What can I do in the bathroom?  Use night lights.  Install grab bars by the toilet and in the tub and shower. Do not use towel bars as grab bars.  Use non-skid mats or decals in the tub or shower.  If you need to sit down in the shower, use a plastic, non-slip stool.  Keep the floor dry. Clean up any water that spills on the floor as soon as it happens.  Remove soap buildup in the tub or shower regularly.  Attach bath mats securely with double-sided non-slip rug tape.  Do not have throw rugs and other things on the floor that can make you trip. What can I do in the bedroom?  Use  night lights.  Make sure that you have a light by your bed that is easy to reach.  Do not use any sheets or blankets that are too big for your bed. They should not hang down onto the floor.  Have a firm chair that has side arms. You can use this for support while you get dressed.  Do not have throw rugs and other things on the floor that can make you trip. What can I do in the kitchen?  Clean up any spills right away.  Avoid walking on wet floors.  Keep items that you use a lot in easy-to-reach places.  If you need to reach something above you, use a strong step stool that has a grab bar.  Keep electrical cords out of the way.  Do not use floor polish or wax that makes floors slippery. If you must use wax, use non-skid floor wax.  Do not have throw rugs and other things on the floor that can make you trip. What can I do with my stairs?  Do not leave any items on the stairs.  Make sure that there are handrails on both sides of the stairs and  use them. Fix handrails that are broken or loose. Make sure that handrails are as long as the stairways.  Check any carpeting to make sure that it is firmly attached to the stairs. Fix any carpet that is loose or worn.  Avoid having throw rugs at the top or bottom of the stairs. If you do have throw rugs, attach them to the floor with carpet tape.  Make sure that you have a light switch at the top of the stairs and the bottom of the stairs. If you do not have them, ask someone to add them for you. What else can I do to help prevent falls?  Wear shoes that:  Do not have high heels.  Have rubber bottoms.  Are comfortable and fit you well.  Are closed at the toe. Do not wear sandals.  If you use a stepladder:  Make sure that it is fully opened. Do not climb a closed stepladder.  Make sure that both sides of the stepladder are locked into place.  Ask someone to hold it for you, if possible.  Clearly mark and make sure that you  can see:  Any grab bars or handrails.  First and last steps.  Where the edge of each step is.  Use tools that help you move around (mobility aids) if they are needed. These include:  Canes.  Walkers.  Scooters.  Crutches.  Turn on the lights when you go into a dark area. Replace any light bulbs as soon as they burn out.  Set up your furniture so you have a clear path. Avoid moving your furniture around.  If any of your floors are uneven, fix them.  If there are any pets around you, be aware of where they are.  Review your medicines with your doctor. Some medicines can make you feel dizzy. This can increase your chance of falling. Ask your doctor what other things that you can do to help prevent falls. This information is not intended to replace advice given to you by your health care provider. Make sure you discuss any questions you have with your health care provider. Document Released: 08/29/2009 Document Revised: 04/09/2016 Document Reviewed: 12/07/2014 Elsevier Interactive Patient Education  2017 [employer].

## 2017-03-01 NOTE — Progress Notes (Signed)
Name: [name]   MRN: [id number]    DOB: Sep 17, 1941   Date:03/01/2017       Progress Note  Subjective  Chief Complaint  No chief complaint on file.   HPI  Patient presents for Annual Wellness Visit part 2, Up to date on Colonoscopy, repeat in 5 years. Will need screening mammogram Up to date on DEXA scan.  Patient is also complaining of anal pruritus, started yesterday after she had a bout of recurrent diarrhea after, she believes this happened after she took her first dose of Fosamax for Osteoporosis, she had recurrent watery stools, she started noticing rectal irritation right afterwards, which has resolved, she is now complaining of vaginal irritation. No vaginal discharge.    Past Medical History:  Diagnosis Date  . Anxiety   . Depression   . GERD (gastroesophageal reflux disease)   . Hypercholesteremia   . Hypertension     Past Surgical History:  Procedure Laterality Date  . COLONOSCOPY  2012   [facility]  . COLONOSCOPY WITH PROPOFOL N/A 09/01/2016   Procedure: COLONOSCOPY WITH PROPOFOL;  Surgeon: Catherine Bellow, MD;  Location: [facility] ENDOSCOPY;  Service: Endoscopy;  Laterality: N/A;  . KNEE SURGERY Left   . SUBCLAVIAN ANGIOGRAM    . UPPER GI ENDOSCOPY  2012    Family History  Problem Relation Age of Onset  . Cancer Mother 101    colon  . Alzheimer's disease Mother   . Hypertension Mother   . Cancer Brother     prostate  . Lymphoma Son   . Cancer Son 22    lymphoma  . Hypertension Father   . Cerebral aneurysm Father   . Diabetes Sister   . Depression Sister   . Hypertension Sister   . Depression Sister   . Cancer Brother   . Gallbladder disease Brother   . Prostate cancer Brother     Social History   Social History  . Marital status: Widowed    Spouse name: N/A  . Number of children: N/A  . Years of education: N/A   Occupational History  . Not on file.   Social History Main Topics  . Smoking status: Never Smoker  . Smokeless  tobacco: Never Used  . Alcohol use No  . Drug use: No  . Sexual activity: Not Currently   Other Topics Concern  . Not on file   Social History Narrative  . No narrative on file     Current Outpatient Prescriptions:  .  alendronate (FOSAMAX) 70 MG tablet, Take 1 tablet (70 mg total) by mouth once a week. Take with a full glass of water on an empty stomach., Disp: 12 tablet, Rfl: 3 .  ALPHAGAN P 0.1 % SOLN, every 8 (eight) hours. , Disp: , Rfl:  .  aspirin 81 MG tablet, Take 1 tablet by mouth daily., Disp: , Rfl:  .  Light Mineral Oil-Mineral Oil (RETAINE MGD OP), Apply to eye., Disp: , Rfl:  .  losartan (COZAAR) 50 MG tablet, TAKE 1 TABLET (50 MG TOTAL) BY MOUTH DAILY., Disp: 30 tablet, Rfl: 5 .  LUMIGAN 0.01 % SOLN, Place 1 drop into both eyes at bedtime., Disp: , Rfl: 1 .  mometasone (ELOCON) 0.1 % ointment, Apply topically daily. (Patient not taking: Reported on 03/01/2017), Disp: 45 g, Rfl: 0 .  omeprazole (PRILOSEC) 20 MG capsule, TAKE 1 CAPSULE (20 MG TOTAL) BY MOUTH 2 (TWO) TIMES DAILY., Disp: 180 capsule, Rfl: 0 .  pravastatin (PRAVACHOL) 20  MG tablet, TAKE 1 TABLET (20 MG TOTAL) BY MOUTH DAILY., Disp: 90 tablet, Rfl: 0 .  sertraline (ZOLOFT) 25 MG tablet, Take 0.5 tablets (12.5 mg total) by mouth daily. (Patient not taking: Reported on 03/01/2017), Disp: 10 tablet, Rfl: 0  Allergies  Allergen Reactions  . Codeine   . Combigan [Brimonidine Tartrate-Timolol]     drowsy  . Sulfa Antibiotics      Review of Systems  Constitutional: Negative for chills, fever and malaise/fatigue.  HENT: Positive for sore throat (drainage in throat). Negative for congestion and ear pain.   Eyes: Negative for blurred vision and double vision.  Respiratory: Negative for cough and shortness of breath.   Cardiovascular: Negative for chest pain and palpitations.  Gastrointestinal: Negative for abdominal pain, constipation, diarrhea (now resolved), nausea and vomiting.  Genitourinary: Negative for  dysuria (burning on urination, vaginal and urethral irriation.) and hematuria.  Musculoskeletal: Negative for back pain and neck pain.  Skin: Negative for rash.  Neurological: Negative for dizziness and headaches (occasional headaches).  Psychiatric/Behavioral: Negative for depression. The patient is not nervous/anxious and does not have insomnia.      Objective  There were no vitals filed for this visit.  Physical Exam  Constitutional: She is oriented to person, place, and time and well-developed, well-nourished, and in no distress.  HENT:  Head: Normocephalic and atraumatic.  Right Ear: External ear normal.  Left Ear: External ear normal.  Mouth/Throat: Oropharynx is clear and moist.  Eyes: Pupils are equal, round, and reactive to light.  Neck: Neck supple. No thyromegaly present.  Cardiovascular: Normal rate, regular rhythm and normal heart sounds.   No murmur heard. Pulmonary/Chest: Effort normal and breath sounds normal. She has no wheezes.  Abdominal: Soft. Bowel sounds are normal. There is no tenderness.  Musculoskeletal: Normal range of motion. She exhibits tenderness. She exhibits no edema.  Neurological: She is alert and oriented to person, place, and time.  Psychiatric: Mood, memory, affect and judgment normal.  Nursing note and vitals reviewed.      Assessment & Plan  1. Well woman exam (no gynecological exam) Obtain age-appropriate labs - CBC with Differential/Platelet - COMPLETE METABOLIC PANEL WITH GFR - TSH - VITAMIN D 25 Hydroxy (Vit-D Deficiency, Fractures)  2. Dysuria Rule out UTI by obtaining urinalysis - Urinalysis, Routine w reflex microscopic  3. Vaginal irritation Referral to GYN for evaluation of vaginal pruritus, patient sees a gynecologist in [place] - Ambulatory referral to Gynecology  4. Screening for breast cancer  - MM Digital Screening; Future   [name] Asad A. [facility] 03/01/2017 3:30 PM

## 2017-03-01 NOTE — Progress Notes (Signed)
Subjective:   [name] is a 76 y.o. female who presents for Medicare Annual (Subsequent) preventive examination.  Review of Systems:  N/A Cardiac Risk Factors include: advanced age (>19men, >24 women);dyslipidemia;hypertension     Objective:     Vitals: BP 122/62 (BP Location: Left Arm)   Pulse 88   Temp 98.2 F (36.8 C) (Oral)   Ht 5\' 3"  (1.6 m)   Wt 143 lb 9.6 oz (65.1 kg)   BMI 25.44 kg/m   Body mass index is 25.44 kg/m.   Tobacco History  Smoking Status  . Never Smoker  Smokeless Tobacco  . Never Used     Counseling given: Not Answered   Past Medical History:  Diagnosis Date  . Anxiety   . Depression   . GERD (gastroesophageal reflux disease)   . Hypercholesteremia   . Hypertension    Past Surgical History:  Procedure Laterality Date  . COLONOSCOPY  2012   [facility]  . COLONOSCOPY WITH PROPOFOL N/A 09/01/2016   Procedure: COLONOSCOPY WITH PROPOFOL;  Surgeon: Robert Bellow, MD;  Location: [facility] ENDOSCOPY;  Service: Endoscopy;  Laterality: N/A;  . KNEE SURGERY Left   . SUBCLAVIAN ANGIOGRAM    . UPPER GI ENDOSCOPY  2012   Family History  Problem Relation Age of Onset  . Cancer Mother 18    colon  . Alzheimer's disease Mother   . Hypertension Mother   . Cancer Brother     prostate  . Lymphoma Son   . Cancer Son 43    lymphoma  . Hypertension Father   . Cerebral aneurysm Father   . Diabetes Sister   . Depression Sister   . Hypertension Sister   . Depression Sister   . Cancer Brother   . Gallbladder disease Brother   . Prostate cancer Brother    History  Sexual Activity  . Sexual activity: Not Currently    Outpatient Encounter Prescriptions as of 03/01/2017  Medication Sig  . alendronate (FOSAMAX) 70 MG tablet Take 1 tablet (70 mg total) by mouth once a week. Take with a full glass of water on an empty stomach.  . ALPHAGAN P 0.1 % SOLN every 8 (eight) hours.   Marland Kitchen aspirin 81 MG tablet Take 1 tablet by mouth daily.  [place] [employer]  Mineral Oil-Mineral Oil (RETAINE MGD OP) Apply to eye.  . losartan (COZAAR) 50 MG tablet TAKE 1 TABLET (50 MG TOTAL) BY MOUTH DAILY.  Marland Kitchen LUMIGAN 0.01 % SOLN Place 1 drop into both eyes at bedtime.  Marland Kitchen omeprazole (PRILOSEC) 20 MG capsule TAKE 1 CAPSULE (20 MG TOTAL) BY MOUTH 2 (TWO) TIMES DAILY.  . pravastatin (PRAVACHOL) 20 MG tablet TAKE 1 TABLET (20 MG TOTAL) BY MOUTH DAILY.  . mometasone (ELOCON) 0.1 % ointment Apply topically daily. (Patient not taking: Reported on 03/01/2017)  . sertraline (ZOLOFT) 25 MG tablet Take 0.5 tablets (12.5 mg total) by mouth daily. (Patient not taking: Reported on 03/01/2017)   No facility-administered encounter medications on file as of 03/01/2017.     Activities of Daily Living In your present state of health, do you have any difficulty performing the following activities: 03/01/2017 12/01/2016  Hearing? N N  Vision? N Y  Difficulty concentrating or making decisions? N N  Walking or climbing stairs? N N  Dressing or bathing? N N  Doing errands, shopping? N N  Preparing Food and eating ? N -  Using the Toilet? N -  In the past six months, have  you accidently leaked urine? N -  Do you have problems with loss of bowel control? N -  Managing your Medications? N -  Managing your Finances? N -  Housekeeping or managing your Housekeeping? N -  Some recent data might be hidden    Patient Care Team: Roselee Nova, MD as PCP - General (Family Medicine) Estill Cotta, MD as Consulting Physician (Ophthalmology) Louretta Shorten, MD as Consulting Physician (Obstetrics and Gynecology)    Assessment:     Exercise Activities and Dietary recommendations Current Exercise Habits: Structured exercise class, Type of exercise: strength training/weights;stretching, Time (Minutes): 30, Frequency (Times/Week): 2 (to 3 days a week), Weekly Exercise (Minutes/Week): 60, Intensity: Mild, Exercise limited by: None identified  Goals    . Increase water intake           Recommend increasing water intake to 4-6 glasses of water a day.      Fall Risk Fall Risk  03/01/2017 12/01/2016 10/20/2016 06/04/2016 03/23/2016  Falls in the past year? No No No No No   Depression Screen PHQ 2/9 Scores 03/01/2017 12/01/2016 10/20/2016 06/04/2016  PHQ - 2 Score 1 0 0 0  PHQ- 9 Score - - - -     Cognitive Function     6CIT Screen 03/01/2017  What Year? 0 points  What month? 0 points  What time? 0 points  Count back from 20 0 points  Months in reverse 0 points  Repeat phrase 4 points  Total Score 4    Immunization History  Administered Date(s) Administered  . Influenza, High Dose Seasonal PF 12/25/2015, 10/20/2016  . Influenza,inj,Quad PF,36+ Mos 09/04/2014  . Pneumococcal Conjugate-13 03/01/2017  . Pneumococcal Polysaccharide-23 05/14/2014   Screening Tests Health Maintenance  Topic Date Due  . PNA vac Low Risk Adult (2 of 2 - PCV13) 05/15/2015  . INFLUENZA VACCINE  06/16/2017  . TETANUS/TDAP  11/16/2021  . DEXA SCAN  Completed      Plan:  I have personally reviewed and addressed the Medicare Annual Wellness questionnaire and have noted the following in the patient's chart:  A. Medical and social history B. Use of alcohol, tobacco or illicit drugs  C. Current medications and supplements D. Functional ability and status E.  Nutritional status F.  Physical activity G. Advance directives H. List of other physicians I.  Hospitalizations, surgeries, and ER visits in previous 12 months J.  [facility] such as hearing and vision if needed, cognitive and depression L. Referrals and appointments - none  In addition, I have reviewed and discussed with patient certain preventive protocols, quality metrics, and best practice recommendations. A written personalized care plan for preventive services as well as general preventive health recommendations were provided to patient.  See attached scanned questionnaire for additional information.   Signed,    Fabio Neighbors, LPN Nurse Health Advisor   MD Recommendations: None.   I, as supervising physician, have reviewed the nurse health advisor's Medicare Wellness Visit note for this patient and concur with the findings and recommendations listed above.  Signed Syed Asad A. Manuella Ghazi MD Attending Physician.

## 2017-03-02 LAB — COMPLETE METABOLIC PANEL WITH GFR
ALT: 14 U/L (ref 6–29)
AST: 17 U/L (ref 10–35)
Albumin: 4.5 g/dL (ref 3.6–5.1)
Alkaline Phosphatase: 57 U/L (ref 33–130)
BUN: 14 mg/dL (ref 7–25)
CO2: 25 mmol/L (ref 20–31)
Calcium: 9.5 mg/dL (ref 8.6–10.4)
Chloride: 108 mmol/L (ref 98–110)
Creat: 0.66 mg/dL (ref 0.60–0.93)
GFR, Est African American: >89 mL/min (ref >=60)
GFR, Est Non African American: 86 mL/min (ref >=60)
Glucose, Bld: 81 mg/dL (ref 65–99)
Potassium: 4.4 mmol/L (ref 3.5–5.3)
Sodium: 146 mmol/L (ref 135–146)
Total Bilirubin: 0.4 mg/dL (ref 0.2–1.2)
Total Protein: 7 g/dL (ref 6.1–8.1)

## 2017-03-02 LAB — URINALYSIS, MICROSCOPIC ONLY
Bacteria, UA: NONE SEEN [HPF]
Casts: NONE SEEN [LPF]
Crystals: NONE SEEN [HPF]
Yeast: NONE SEEN [HPF]

## 2017-03-02 LAB — URINALYSIS, ROUTINE W REFLEX MICROSCOPIC
Bilirubin Urine: NEGATIVE
Glucose, UA: NEGATIVE
Nitrite: NEGATIVE
Specific Gravity, Urine: 1.023 (ref 1.001–1.035)
pH: 6 (ref 5.0–8.0)

## 2017-03-02 LAB — VITAMIN D 25 HYDROXY (VIT D DEFICIENCY, FRACTURES): Vit D, 25-Hydroxy: 42 ng/mL (ref 30–100)

## 2017-03-02 LAB — TSH: TSH: 2.71 mIU/L

## 2017-03-05 ENCOUNTER — Telehealth: Payer: Self-pay | Admitting: Family Medicine

## 2017-03-05 ENCOUNTER — Other Ambulatory Visit: Payer: Self-pay

## 2017-03-05 DIAGNOSIS — R8281 Pyuria: Secondary | ICD-10-CM

## 2017-03-05 DIAGNOSIS — N390 Urinary tract infection, site not specified: Secondary | ICD-10-CM | POA: Diagnosis not present

## 2017-03-05 MED ORDER — NITROFURANTOIN MONOHYD MACRO 100 MG PO CAPS: 100.0000 mg | ORAL_CAPSULE | Freq: Two times a day (BID) | ORAL | 0 refills | Status: DC

## 2017-03-05 NOTE — Telephone Encounter (Signed)
Time out message appeared on Dr. Trena Platt desk in his absence: Urine microscopic exam shows 20-40 WBCs per HPF, 3-10 RBCs per HPF, urinalysis shows trace ketones, trace hemoglobin, trace protein and 2+ leukocytes. Please order a urine culture to rule out UTI. ----------------------------- [name], please call patient, I'm covering for Dr. Manuella Ghazi; is she taking antibiotics for what looks like a bladder infection? Can we still get a culture? Is she having any symptoms? Back to me for orders or antibiotics or advice Thank you

## 2017-03-05 NOTE — Telephone Encounter (Signed)
Allergic to sulfonamides Last Cr 0.66 Start macrobid Orders entered for urine, micro, culture

## 2017-03-05 NOTE — Assessment & Plan Note (Signed)
Start ABX; urine

## 2017-03-05 NOTE — Telephone Encounter (Signed)
Dr. Sanda Klein, I spoke with patient she is still experiencing symptoms and would like to be started on antibiotic she has not had any, I tried to order urine culture yesterday 03/04/2017 however it was too late for the urine, while speaking with patient today she stated that she would stop by today for a urine specimen and then we can order a culture if you would like. Thank you

## 2017-03-06 ENCOUNTER — Other Ambulatory Visit: Payer: Self-pay | Admitting: Family Medicine

## 2017-03-06 DIAGNOSIS — F3342 Major depressive disorder, recurrent, in full remission: Secondary | ICD-10-CM

## 2017-03-06 LAB — URINALYSIS W MICROSCOPIC + REFLEX CULTURE
Bacteria, UA: NONE SEEN [HPF]
Bilirubin Urine: NEGATIVE
Casts: NONE SEEN [LPF]
Crystals: NONE SEEN [HPF]
Glucose, UA: NEGATIVE
Hgb urine dipstick: NEGATIVE
Ketones, ur: NEGATIVE
Leukocytes, UA: NEGATIVE
Nitrite: NEGATIVE
Protein, ur: NEGATIVE
RBC / HPF: NONE SEEN RBC/HPF (ref <=2)
Specific Gravity, Urine: 1.023 (ref 1.001–1.035)
Yeast: NONE SEEN [HPF]
pH: 5.5 (ref 5.0–8.0)

## 2017-03-18 DIAGNOSIS — N8182 Incompetence or weakening of pubocervical tissue: Secondary | ICD-10-CM | POA: Diagnosis not present

## 2017-04-02 ENCOUNTER — Other Ambulatory Visit: Payer: Self-pay | Admitting: Family Medicine

## 2017-04-02 DIAGNOSIS — E785 Hyperlipidemia, unspecified: Secondary | ICD-10-CM

## 2017-04-08 ENCOUNTER — Ambulatory Visit: Payer: Self-pay | Admitting: Family Medicine

## 2017-04-24 ENCOUNTER — Other Ambulatory Visit: Payer: Self-pay | Admitting: Family Medicine

## 2017-04-24 DIAGNOSIS — K219 Gastro-esophageal reflux disease without esophagitis: Secondary | ICD-10-CM

## 2017-05-06 DIAGNOSIS — H401132 Primary open-angle glaucoma, bilateral, moderate stage: Secondary | ICD-10-CM | POA: Diagnosis not present

## 2017-05-14 ENCOUNTER — Other Ambulatory Visit: Payer: Self-pay | Admitting: Family Medicine

## 2017-05-14 DIAGNOSIS — I10 Essential (primary) hypertension: Secondary | ICD-10-CM

## 2017-05-25 DIAGNOSIS — H401132 Primary open-angle glaucoma, bilateral, moderate stage: Secondary | ICD-10-CM | POA: Diagnosis not present

## 2017-05-26 ENCOUNTER — Ambulatory Visit (INDEPENDENT_AMBULATORY_CARE_PROVIDER_SITE_OTHER): Payer: Medicare HMO | Admitting: Family Medicine

## 2017-05-26 ENCOUNTER — Encounter: Payer: Self-pay | Admitting: Family Medicine

## 2017-05-26 VITALS — BP 122/70 | HR 91 | Temp 98.5°F | Resp 16 | Ht 63.0 in | Wt 145.1 lb

## 2017-05-26 DIAGNOSIS — M25551 Pain in right hip: Secondary | ICD-10-CM

## 2017-05-27 ENCOUNTER — Telehealth: Payer: Self-pay | Admitting: Family Medicine

## 2017-05-27 MED ORDER — NAPROXEN 500 MG PO TABS: 500.0000 mg | ORAL_TABLET | Freq: Two times a day (BID) | ORAL | 0 refills | Status: AC

## 2017-05-27 NOTE — Progress Notes (Signed)
Name: [name]   MRN: [id number]    DOB: Oct 24, 1941   Date:05/27/2017       Progress Note  Subjective  Chief Complaint  Chief Complaint  Patient presents with  . Leg Pain    x5 days  . Hip Pain    x5 days    Hip Pain   There was no injury mechanism (has been more active keeping her son's dog and house in his absence). The pain is present in the right hip, right thigh and right leg. The quality of the pain is described as aching. The pain is at a severity of 8/10. The pain is moderate. The pain has been improving since onset. The symptoms are aggravated by movement and palpation. She has tried NSAIDs for the symptoms. The treatment provided mild relief.     Past Medical History:  Diagnosis Date  . Anxiety   . Depression   . GERD (gastroesophageal reflux disease)   . Hypercholesteremia   . Hypertension     Past Surgical History:  Procedure Laterality Date  . COLONOSCOPY  2012   [facility]  . COLONOSCOPY WITH PROPOFOL N/A 09/01/2016   Procedure: COLONOSCOPY WITH PROPOFOL;  Surgeon: Robert Bellow, MD;  Location: [facility] ENDOSCOPY;  Service: Endoscopy;  Laterality: N/A;  . KNEE SURGERY Left   . SUBCLAVIAN ANGIOGRAM    . UPPER GI ENDOSCOPY  2012    Family History  Problem Relation Age of Onset  . Cancer Mother 52       colon  . Alzheimer's disease Mother   . Hypertension Mother   . Cancer Brother        prostate  . Lymphoma Son   . Cancer Son 81       lymphoma  . Hypertension Father   . Cerebral aneurysm Father   . Diabetes Sister   . Depression Sister   . Hypertension Sister   . Depression Sister   . Cancer Brother   . Gallbladder disease Brother   . Prostate cancer Brother     Social History   Social History  . Marital status: Widowed    Spouse name: N/A  . Number of children: N/A  . Years of education: N/A   Occupational History  . Not on file.   Social History Main Topics  . Smoking status: Never Smoker  . Smokeless tobacco: Never Used    . Alcohol use No  . Drug use: No  . Sexual activity: Not Currently   Other Topics Concern  . Not on file   Social History Narrative  . No narrative on file     Current Outpatient Prescriptions:  .  alendronate (FOSAMAX) 70 MG tablet, Take 1 tablet (70 mg total) by mouth once a week. Take with a full glass of water on an empty stomach., Disp: 12 tablet, Rfl: 3 .  ALPHAGAN P 0.1 % SOLN, every 8 (eight) hours. , Disp: , Rfl:  .  aspirin 81 MG tablet, Take 1 tablet by mouth daily., Disp: , Rfl:  .  Light Mineral Oil-Mineral Oil (RETAINE MGD OP), Apply to eye., Disp: , Rfl:  .  losartan (COZAAR) 50 MG tablet, TAKE 1 TABLET (50 MG TOTAL) BY MOUTH DAILY., Disp: 30 tablet, Rfl: 5 .  LUMIGAN 0.01 % SOLN, Place 1 drop into both eyes at bedtime., Disp: , Rfl: 1 .  omeprazole (PRILOSEC) 20 MG capsule, TAKE 1 CAPSULE (20 MG TOTAL) BY MOUTH 2 (TWO) TIMES DAILY., Disp: 180 capsule, Rfl:  0 .  pravastatin (PRAVACHOL) 20 MG tablet, TAKE 1 TABLET (20 MG TOTAL) BY MOUTH DAILY., Disp: 90 tablet, Rfl: 0 .  timolol (BETIMOL) 0.5 % ophthalmic solution, Place 1 drop into the left eye daily., Disp: , Rfl:  .  mometasone (ELOCON) 0.1 % ointment, Apply topically daily. (Patient not taking: Reported on 03/01/2017), Disp: 45 g, Rfl: 0 .  nitrofurantoin, macrocrystal-monohydrate, (MACROBID) 100 MG capsule, Take 1 capsule (100 mg total) by mouth 2 (two) times daily. (Patient not taking: Reported on 05/26/2017), Disp: 14 capsule, Rfl: 0 .  sertraline (ZOLOFT) 25 MG tablet, TAKE 0.5 TABLETS (12.5 MG TOTAL) BY MOUTH DAILY. (Patient not taking: Reported on 05/26/2017), Disp: 15 tablet, Rfl: 0  Allergies  Allergen Reactions  . Codeine   . Combigan [Brimonidine Tartrate-Timolol]     drowsy  . Sulfa Antibiotics      ROS  Truman Hayward see history of present illness for complete discussion of ROS  Objective  Vitals:   05/26/17 1509  BP: 122/70  Pulse: 91  Resp: 16  Temp: 98.5 F (36.9 C)  TempSrc: Oral  SpO2: 94%   Weight: 145 lb 1.6 oz (65.8 kg)  Height: 5\' 3"  (1.6 m)    Physical Exam  Constitutional: She is oriented to person, place, and time and well-developed, well-nourished, and in no distress.  Cardiovascular: Normal rate, regular rhythm and normal heart sounds.   No murmur heard. Pulmonary/Chest: Effort normal and breath sounds normal. She has no wheezes.  Musculoskeletal:       Legs: Neurological: She is alert and oriented to person, place, and time.  Psychiatric: Mood, memory, affect and judgment normal.  Nursing note and vitals reviewed.      Assessment & Plan  1. Acute right hip pain Likely because of overuse, started on NSAIDs, advised to take with food, DC if GI distress develops. If patient is still symptomatic after 5 days of treatment, consider x-rays - naproxen (NAPROSYN) 500 MG tablet; Take 1 tablet (500 mg total) by mouth 2 (two) times daily with a meal.  Dispense: 10 tablet; Refill: 0   Rosealie Reach [facility] 05/27/2017 6:55 PM

## 2017-05-27 NOTE — Telephone Encounter (Signed)
Pt was seen yesterday and our system went down. States that you where going to call in 800 mg of Aleve to [facility]. States she is in a lot of pain and is asking that you please do it today.

## 2017-05-27 NOTE — Telephone Encounter (Signed)
Spoke with patient and sent a prescription for naproxen 500 mg twice a day to her pharmacy

## 2017-05-31 ENCOUNTER — Ambulatory Visit: Payer: Self-pay | Admitting: Family Medicine

## 2017-06-07 DIAGNOSIS — H401132 Primary open-angle glaucoma, bilateral, moderate stage: Secondary | ICD-10-CM | POA: Diagnosis not present

## 2017-06-12 ENCOUNTER — Other Ambulatory Visit: Payer: Self-pay | Admitting: Family Medicine

## 2017-06-12 DIAGNOSIS — I10 Essential (primary) hypertension: Secondary | ICD-10-CM

## 2017-06-29 DIAGNOSIS — N841 Polyp of cervix uteri: Secondary | ICD-10-CM | POA: Diagnosis not present

## 2017-07-04 ENCOUNTER — Other Ambulatory Visit: Payer: Self-pay | Admitting: Family Medicine

## 2017-07-04 DIAGNOSIS — E785 Hyperlipidemia, unspecified: Secondary | ICD-10-CM

## 2017-07-13 DIAGNOSIS — N842 Polyp of vagina: Secondary | ICD-10-CM | POA: Diagnosis not present

## 2017-07-13 DIAGNOSIS — L929 Granulomatous disorder of the skin and subcutaneous tissue, unspecified: Secondary | ICD-10-CM | POA: Diagnosis not present

## 2017-07-18 ENCOUNTER — Other Ambulatory Visit: Payer: Self-pay | Admitting: Family Medicine

## 2017-07-18 DIAGNOSIS — E785 Hyperlipidemia, unspecified: Secondary | ICD-10-CM

## 2017-08-09 ENCOUNTER — Ambulatory Visit (INDEPENDENT_AMBULATORY_CARE_PROVIDER_SITE_OTHER): Payer: Medicare HMO | Admitting: Family Medicine

## 2017-08-09 ENCOUNTER — Encounter: Payer: Self-pay | Admitting: Family Medicine

## 2017-08-09 VITALS — BP 124/80 | HR 88 | Temp 99.3°F | Resp 16 | Ht 63.0 in | Wt 137.4 lb

## 2017-08-09 DIAGNOSIS — R05 Cough: Secondary | ICD-10-CM | POA: Diagnosis not present

## 2017-08-09 DIAGNOSIS — J040 Acute laryngitis: Secondary | ICD-10-CM

## 2017-08-09 DIAGNOSIS — R059 Cough, unspecified: Secondary | ICD-10-CM

## 2017-08-09 DIAGNOSIS — J069 Acute upper respiratory infection, unspecified: Secondary | ICD-10-CM | POA: Diagnosis not present

## 2017-08-09 DIAGNOSIS — R52 Pain, unspecified: Secondary | ICD-10-CM

## 2017-08-09 LAB — POCT INFLUENZA A/B
Influenza A, POC: NEGATIVE
Influenza B, POC: NEGATIVE

## 2017-08-09 MED ORDER — BENZONATATE 100 MG PO CAPS: 100.0000 mg | ORAL_CAPSULE | Freq: Three times a day (TID) | ORAL | 0 refills | Status: DC | PRN

## 2017-08-09 MED ORDER — PREDNISONE 10 MG PO TABS: 10.0000 mg | ORAL_TABLET | Freq: Every day | ORAL | 0 refills | Status: DC

## 2017-08-09 NOTE — Patient Instructions (Addendum)
Take Tylenol as needed for body aches, pain, and/or fever. Stay hydrated and drink plenty of water while you are feeling sick. Upper Respiratory Infection, Adult Most upper respiratory infections (URIs) are caused by a virus. A URI affects the nose, throat, and upper air passages. The most common type of URI is often called "the common cold." Follow these instructions at home:  Take medicines only as told by your doctor.  Gargle warm saltwater or take cough drops to comfort your throat as told by your doctor.  Use a warm mist humidifier or inhale steam from a shower to increase air moisture. This may make it easier to breathe.  Drink enough fluid to keep your pee (urine) clear or pale yellow.  Eat soups and other clear broths.  Have a healthy diet.  Rest as needed.  Go back to work when your fever is gone or your doctor says it is okay. ? You may need to stay home longer to avoid giving your URI to others. ? You can also wear a face mask and wash your hands often to prevent spread of the virus.  Use your inhaler more if you have asthma.  Do not use any tobacco products, including cigarettes, chewing tobacco, or electronic cigarettes. If you need help quitting, ask your doctor. Contact a doctor if:  You are getting worse, not better.  Your symptoms are not helped by medicine.  You have chills.  You are getting more short of breath.  You have brown or red mucus.  You have yellow or brown discharge from your nose.  You have pain in your face, especially when you bend forward.  You have a fever.  You have puffy (swollen) neck glands.  You have pain while swallowing.  You have white areas in the back of your throat. Get help right away if:  You have very bad or constant: ? Headache. ? Ear pain. ? Pain in your forehead, behind your eyes, and over your cheekbones (sinus pain). ? Chest pain.  You have long-lasting (chronic) lung disease and any of the  following: ? Wheezing. ? Long-lasting cough. ? Coughing up blood. ? A change in your usual mucus.  You have a stiff neck.  You have changes in your: ? Vision. ? Hearing. ? Thinking. ? Mood. This information is not intended to replace advice given to you by your health care provider. Make sure you discuss any questions you have with your health care provider. Document Released: 04/20/2008 Document Revised: 07/05/2016 Document Reviewed: 02/07/2014 Elsevier Interactive Patient Education  2018 [employer].

## 2017-08-09 NOTE — Progress Notes (Addendum)
Name: [name]   MRN: [id number]    DOB: 30-Dec-1940   Date:08/09/2017       Progress Note  Subjective  Chief Complaint  Chief Complaint  Patient presents with  . URI    no voice, nasal drainage yellow/green, cough, body aches for 4 days    HPI  Pt presentw with 4 days of body aches, headache, sore throat with very hoarse voice, subjective fevers and chills, nausea, nasal congestion with yellow drainage, productive yellow/green cough.  No vomiting or diarrhea, chest pain, shortness of breath.  Has been taking OTC cold remedy without Acetaminophen and this has only minimally helped. Non-smoker, no history of asthma or bronchitis.  Patient Active Problem List   Diagnosis Date Noted  . Pyuria 03/05/2017  . Acute sinusitis 10/21/2016  . Dermatitis 10/20/2016  . GERD (gastroesophageal reflux disease) 10/20/2016  . Encounter for screening colonoscopy 08/18/2016  . Well woman exam (no gynecological exam) 06/04/2016  . Anterior chest wall pain 03/23/2016  . Anxiety, generalized 01/28/2016  . BP (high blood pressure) 01/28/2016  . Osteoarthritis of right knee 12/25/2015  . Depression 09/05/2015  . Abnormal brain CT 06/20/2015  . Anxiety, mild 06/20/2015  . Blood in feces 06/20/2015  . Brain lesion 06/20/2015  . 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 Clinical depression 06/20/2015  . Dyslipidemia 06/20/2015  . Dizziness 06/20/2015  . Hyperlipidemia LDL goal <100 06/20/2015  . Benign hypertension 06/20/2015  . Affective disorder, major 06/20/2015  . Arthralgia of shoulder 06/20/2015  . Herpes zona 06/20/2015  . Breast disease 06/20/2015  . Pain in shoulder 06/20/2015  . Glaucoma 06/20/2015  . Annual physical exam 06/04/2015  . Post-menopausal 06/04/2015  . Bilateral leg weakness 06/04/2015    Social History  Substance Use Topics  . Smoking status: Never Smoker  . Smokeless tobacco: Never Used  . Alcohol use No     Current Outpatient Prescriptions:  .   alendronate (FOSAMAX) 70 MG tablet, Take 1 tablet (70 mg total) by mouth once a week. Take with a full glass of water on an empty stomach., Disp: 12 tablet, Rfl: 3 .  ALPHAGAN P 0.1 % SOLN, every 8 (eight) hours. , Disp: , Rfl:  .  aspirin 81 MG tablet, Take 1 tablet by mouth daily., Disp: , Rfl:  .  Light Mineral Oil-Mineral Oil (RETAINE MGD OP), Apply to eye., Disp: , Rfl:  .  losartan (COZAAR) 50 MG tablet, TAKE 1 TABLET (50 MG TOTAL) BY MOUTH DAILY., Disp: 30 tablet, Rfl: 5 .  LUMIGAN 0.01 % SOLN, Place 1 drop into both eyes at bedtime., Disp: , Rfl: 1 .  mometasone (ELOCON) 0.1 % ointment, Apply topically daily., Disp: 45 g, Rfl: 0 .  nitrofurantoin, macrocrystal-monohydrate, (MACROBID) 100 MG capsule, Take 1 capsule (100 mg total) by mouth 2 (two) times daily., Disp: 14 capsule, Rfl: 0 .  omeprazole (PRILOSEC) 20 MG capsule, TAKE 1 CAPSULE (20 MG TOTAL) BY MOUTH 2 (TWO) TIMES DAILY., Disp: 180 capsule, Rfl: 0 .  pravastatin (PRAVACHOL) 20 MG tablet, TAKE 1 TABLET (20 MG TOTAL) BY MOUTH DAILY., Disp: 90 tablet, Rfl: 0 .  sertraline (ZOLOFT) 25 MG tablet, TAKE 0.5 TABLETS (12.5 MG TOTAL) BY MOUTH DAILY., Disp: 15 tablet, Rfl: 0 .  timolol (BETIMOL) 0.5 % ophthalmic solution, Place 1 drop into the left eye daily., Disp: , Rfl:   Allergies  Allergen Reactions  . Codeine   . Combigan [Brimonidine Tartrate-Timolol]     drowsy  . Sulfa Antibiotics  ROS  Ten systems reviewed and is negative except as mentioned in HPI  Objective  Vitals:   08/09/17 1021  BP: 124/80  Pulse: 88  Resp: 16  Temp: 99.3 F (37.4 C)  TempSrc: Oral  SpO2: 95%  Weight: 137 lb 6.4 oz (62.3 kg)  Height: 5\' 3"  (1.6 m)   Body mass index is 24.34 kg/m.  Nursing Note and Vital Signs reviewed.  Physical Exam  Constitutional: Patient appears well-developed and well-nourished. No distress.  HEENT: head atraumatic, normocephalic, pupils equal and reactive to light, EOM's intact, TM's without erythema or  bulging, no maxillary or frontal sinus pain on palpation, neck supple without lymphadenopathy, oropharynx mildly erythematous and moist without exudate. Notably hoarse voice during examination. Cardiovascular: Normal rate, regular rhythm, S1/S2 present.  No murmur or rub heard. No BLE edema. Pulmonary/Chest: Effort normal and breath sounds clear with no wheezing, rhonchi, or crackles. No respiratory distress or retractions. Psychiatric: Patient has a normal mood and affect. behavior is normal. Judgment and thought content normal.  No results found for this or any previous visit (from the past 2160 hour(s)).   Assessment & Plan  1. Viral upper respiratory tract infection - predniSONE (DELTASONE) 10 MG tablet; Take 1 tablet (10 mg total) by mouth daily with breakfast. Day1:5tabs, Day2:4tabs, Day3:3tabs, Day4:2tabs, Day5:1tab  Dispense: 15 tablet; Refill: 0 - POCT Influenza A/B - Negative  2. Laryngitis - predniSONE (DELTASONE) 10 MG tablet; Take 1 tablet (10 mg total) by mouth daily with breakfast. Day1:5tabs, Day2:4tabs, Day3:3tabs, Day4:2tabs, Day5:1tab  Dispense: 15 tablet; Refill: 0  3. Cough - benzonatate (TESSALON PERLES) 100 MG capsule; Take 1 capsule (100 mg total) by mouth 3 (three) times daily as needed.  Dispense: 20 capsule; Refill: 0  4. Body aches - POCT Influenza A/B - Negative  -Work note provided for today and tomorrow.  We will provide symptomatic management for the time being.  Return if symptoms worsen or fail to improve, for 3-5 days.  Advised that we will consider Chest Xray and/or lab testing if not improving in 3-5 times.  -Red flags and when to present for emergency care or RTC including fever >101.65F, chest pain, shortness of breath, new/worsening/un-resolving symptoms, reviewed with patient at time of visit. Follow up and care instructions discussed and provided in AVS.  I have reviewed this encounter including the documentation in this note and/or discussed this  patient with the Johney Maine, FNP, NP-C. I am certifying that I agree with the content of this note as supervising physician.  Steele Sizer, MD [facility] 08/09/2017, 5:14 PM

## 2017-08-11 DIAGNOSIS — N812 Incomplete uterovaginal prolapse: Secondary | ICD-10-CM | POA: Diagnosis not present

## 2017-08-16 ENCOUNTER — Ambulatory Visit (INDEPENDENT_AMBULATORY_CARE_PROVIDER_SITE_OTHER): Payer: Medicare HMO | Admitting: Family Medicine

## 2017-08-16 ENCOUNTER — Ambulatory Visit
Admission: RE | Admit: 2017-08-16 | Discharge: 2017-08-16 | Disposition: A | Discharge status: Home / Self Care | Payer: Medicare HMO | Source: Ambulatory Visit | Attending: Family Medicine | Admitting: Family Medicine

## 2017-08-16 ENCOUNTER — Encounter: Payer: Self-pay | Admitting: Family Medicine

## 2017-08-16 VITALS — BP 124/80 | HR 112 | Ht 63.0 in | Wt 137.4 lb

## 2017-08-16 DIAGNOSIS — R0689 Other abnormalities of breathing: Secondary | ICD-10-CM

## 2017-08-16 DIAGNOSIS — R5383 Other fatigue: Secondary | ICD-10-CM | POA: Diagnosis not present

## 2017-08-16 DIAGNOSIS — J0100 Acute maxillary sinusitis, unspecified: Secondary | ICD-10-CM

## 2017-08-16 DIAGNOSIS — R05 Cough: Secondary | ICD-10-CM | POA: Diagnosis not present

## 2017-08-16 MED ORDER — AZITHROMYCIN 250 MG PO TABS: ORAL_TABLET | ORAL | 0 refills | Status: DC

## 2017-08-16 NOTE — Progress Notes (Signed)
Name: [name]   MRN: [id number]    DOB: [birth year]   Date:08/16/2017       Progress Note  Subjective  Chief Complaint  Chief Complaint  Patient presents with  . Follow-up  . Medication Refill  . Fatigue    Pt states that she feels very weak, took all her antibiotic and tessalon pearles    . Nausea    HPI  Pt. Presents for drainage in her throat, generalized body aches, fatigue and runny nose. She had laryngitis and a viral upper respiratory infection and was seen last week. SHe received Prednisone tapering course and Benzonatate. She finished the above medications but still feels very weak and feels like she has not completely recovered.    Past Medical History:  Diagnosis Date  . Anxiety   . Depression   . GERD (gastroesophageal reflux disease)   . Hypercholesteremia   . Hypertension     Past Surgical History:  Procedure Laterality Date  . COLONOSCOPY  2012   [facility]  . COLONOSCOPY WITH PROPOFOL N/A 09/01/2016   Procedure: COLONOSCOPY WITH PROPOFOL;  Surgeon: Robert Bellow, MD;  Location: [facility] ENDOSCOPY;  Service: Endoscopy;  Laterality: N/A;  . KNEE SURGERY Left   . SUBCLAVIAN ANGIOGRAM    . UPPER GI ENDOSCOPY  2012    Family History  Problem Relation Age of Onset  . Cancer Mother 96       colon  . Alzheimer's disease Mother   . Hypertension Mother   . Cancer Brother        prostate  . Lymphoma Son   . Cancer Son 78       lymphoma  . Hypertension Father   . Cerebral aneurysm Father   . Diabetes Sister   . Depression Sister   . Hypertension Sister   . Depression Sister   . Cancer Brother   . Gallbladder disease Brother   . Prostate cancer Brother     Social History   Social History  . Marital status: Widowed    Spouse name: N/A  . Number of children: N/A  . Years of education: N/A   Occupational History  . Not on file.   Social History Main Topics  . Smoking status: Never Smoker  . Smokeless tobacco: Never Used  . Alcohol use  No  . Drug use: No  . Sexual activity: Not Currently   Other Topics Concern  . Not on file   Social History Narrative  . No narrative on file     Current Outpatient Prescriptions:  .  ALPHAGAN P 0.1 % SOLN, every 8 (eight) hours. , Disp: , Rfl:  .  aspirin 81 MG tablet, Take 1 tablet by mouth daily., Disp: , Rfl:  .  Light Mineral Oil-Mineral Oil (RETAINE MGD OP), Apply to eye., Disp: , Rfl:  .  losartan (COZAAR) 50 MG tablet, TAKE 1 TABLET (50 MG TOTAL) BY MOUTH DAILY., Disp: 30 tablet, Rfl: 5 .  LUMIGAN 0.01 % SOLN, Place 1 drop into both eyes at bedtime., Disp: , Rfl: 1 .  mometasone (ELOCON) 0.1 % ointment, Apply topically daily., Disp: 45 g, Rfl: 0 .  omeprazole (PRILOSEC) 20 MG capsule, TAKE 1 CAPSULE (20 MG TOTAL) BY MOUTH 2 (TWO) TIMES DAILY., Disp: 180 capsule, Rfl: 0 .  pravastatin (PRAVACHOL) 20 MG tablet, TAKE 1 TABLET (20 MG TOTAL) BY MOUTH DAILY., Disp: 90 tablet, Rfl: 0 .  sertraline (ZOLOFT) 25 MG tablet, TAKE 0.5 TABLETS (12.5 MG TOTAL)  BY MOUTH DAILY., Disp: 15 tablet, Rfl: 0 .  timolol (BETIMOL) 0.5 % ophthalmic solution, Place 1 drop into the left eye daily., Disp: , Rfl:  .  alendronate (FOSAMAX) 70 MG tablet, Take 1 tablet (70 mg total) by mouth once a week. Take with a full glass of water on an empty stomach. (Patient not taking: Reported on 08/16/2017), Disp: 12 tablet, Rfl: 3  Allergies  Allergen Reactions  . Codeine   . Combigan [Brimonidine Tartrate-Timolol]     drowsy  . Sulfa Antibiotics      Review of Systems  Constitutional: Positive for malaise/fatigue. Negative for chills and fever.  HENT: Positive for congestion, sinus pain and sore throat.   Respiratory: Positive for cough and sputum production. Negative for shortness of breath and wheezing.   Cardiovascular: Negative for chest pain.  Gastrointestinal: Positive for nausea. Negative for diarrhea and vomiting.  Neurological: Positive for dizziness.      Objective  Vitals:   08/16/17 1444   BP: 124/80  Pulse: (!) 112  Weight: 137 lb 6.4 oz (62.3 kg)  Height: 5\' 3"  (1.6 m)    Physical Exam  Constitutional: She is well-developed, well-nourished, and in no distress.  HENT:  Head: Normocephalic and atraumatic.  Right Ear: Tympanic membrane and ear canal normal. No drainage.  Left Ear: Tympanic membrane and ear canal normal. No drainage.  Nose: Right sinus exhibits maxillary sinus tenderness. Right sinus exhibits no frontal sinus tenderness. Left sinus exhibits maxillary sinus tenderness. Left sinus exhibits no frontal sinus tenderness.  Mouth/Throat: No posterior oropharyngeal erythema.  Neck: Neck supple.  Cardiovascular: Normal rate, regular rhythm and normal heart sounds.   No murmur heard. Pulmonary/Chest: Effort normal. No respiratory distress. She has decreased breath sounds in the right middle field, the right lower field, the left middle field and the left lower field. She has no wheezes. She has no rhonchi. She has no rales.  Nursing note and vitals reviewed.     Recent Results (from the past 2160 hour(s))  POCT Influenza A/B     Status: Normal   Collection Time: 08/09/17  1:14 PM  Result Value Ref Range   Influenza A, POC Negative Negative   Influenza B, POC Negative Negative     Assessment & Plan  1. Acute non-recurrent maxillary sinusitis Started on antibiotic treatment, patient has completed prednisone and Tessalon without significant relief - azithromycin (ZITHROMAX) 250 MG tablet; 2 tabs po day 1, then 1 tab po q day x 4 days  Dispense: 6 each; Refill: 0  2. Decreased breath sounds Rule out pneumonia given she has congestion and sinusitis along with decreased breath sounds. - DG Chest 2 View; Future   Crytal Pensinger [facility] 08/16/2017 3:31 PM

## 2017-08-18 ENCOUNTER — Telehealth: Payer: Self-pay

## 2017-08-18 NOTE — Telephone Encounter (Signed)
-----   Message from Roselee Nova, MD sent at 08/17/2017 10:05 AM EDT ----- Chest x-ray shows no active cardiopulmonary disease.

## 2017-08-18 NOTE — Telephone Encounter (Signed)
With patient reports that she is feeling very weak and fatigued, last night she was better but this morning she has experienced recurrence of weakness. I suspect it is from the infection and deconditioning could also be because of dehydration. Advised to increase fluid intak, increase fluid intake to replenish the energy that was lost fighting the infection. If she does not feel any better by tomorrow, she should schedule an appointment for follow-up. Patient verbalized agreement with plan.

## 2017-08-18 NOTE — Telephone Encounter (Signed)
Called pt informed her of neg x-ray. Pt gave verbal understanding. However notes that she is unable to work due to being very weak and still feels no better. Pt also notes nasal drainage and headache today as new symptoms, pt wanted me to inform you of this. She does have a f/u appt scheduled with you for Monday

## 2017-08-23 ENCOUNTER — Ambulatory Visit (INDEPENDENT_AMBULATORY_CARE_PROVIDER_SITE_OTHER): Payer: Medicare HMO | Admitting: Family Medicine

## 2017-08-23 ENCOUNTER — Encounter: Payer: Self-pay | Admitting: Family Medicine

## 2017-08-23 VITALS — BP 130/76 | HR 100 | Temp 98.0°F | Resp 16 | Ht 63.0 in | Wt 138.4 lb

## 2017-08-23 DIAGNOSIS — M810 Age-related osteoporosis without current pathological fracture: Secondary | ICD-10-CM | POA: Diagnosis not present

## 2017-08-23 DIAGNOSIS — R1011 Right upper quadrant pain: Secondary | ICD-10-CM

## 2017-08-23 DIAGNOSIS — R922 Inconclusive mammogram: Secondary | ICD-10-CM

## 2017-08-23 DIAGNOSIS — E785 Hyperlipidemia, unspecified: Secondary | ICD-10-CM

## 2017-08-23 DIAGNOSIS — I10 Essential (primary) hypertension: Secondary | ICD-10-CM

## 2017-08-23 MED ORDER — PRAVASTATIN SODIUM 20 MG PO TABS: 20.0000 mg | ORAL_TABLET | Freq: Every day | ORAL | 0 refills | Status: DC

## 2017-08-23 MED ORDER — ALENDRONATE SODIUM 70 MG PO TABS: 70.0000 mg | ORAL_TABLET | ORAL | 3 refills | Status: DC

## 2017-08-23 MED ORDER — LOSARTAN POTASSIUM 50 MG PO TABS: 50.0000 mg | ORAL_TABLET | Freq: Every day | ORAL | 5 refills | Status: DC

## 2017-08-23 NOTE — Progress Notes (Signed)
Name: [name]   MRN: [id number]    DOB: 06-Apr-1941   Date:08/23/2017       Progress Note  Subjective  Chief Complaint  Chief Complaint  Patient presents with  . Follow-up    Hypertension  This is a chronic problem. The problem is unchanged. The problem is controlled. Pertinent negatives include no blurred vision, chest pain, headaches, orthopnea, palpitations, shortness of breath or sweats. Past treatments include angiotensin blockers. There is no history of kidney disease, CAD/MI or CVA.  Hyperlipidemia  This is a chronic problem. The problem is uncontrolled. Recent lipid tests were reviewed and are high. Pertinent negatives include no chest pain, leg pain, myalgias or shortness of breath. Current antihyperlipidemic treatment includes statins.  Abdominal Pain  This is a recurrent problem. The current episode started more than 1 year ago (experiencing right upper quadrant abdominal pain since late 2012). The problem occurs intermittently. The problem has been unchanged. The pain is located in the RUQ. Pain scale: fluctuates between 4/10 to 9/10. The pain is moderate. The quality of the pain is aching. The abdominal pain does not radiate. Pertinent negatives include no belching, diarrhea, fever, frequency, headaches, myalgias, nausea or vomiting. Nothing (has noticed doing dishes makes it worse, also worse with picking up kitchen utensils) aggravates the pain. She has tried nothing for the symptoms. Prior diagnostic workup includes CT scan and ultrasound (Abdominal US in 2012, CT abdomen pelvis in 2012).   She had an incomplete mammogram in January 2017 according to review of his imaging, but was noted to have an architectural distortion in the right breast upper outer quadrant, needed additional imaging including diagnostic mammogram. She denies any pain, a palpable mass at the site. We will order a diagnostic mammogram for evaluation  Past Medical History:  Diagnosis Date  . Anxiety    . Depression   . GERD (gastroesophageal reflux disease)   . Hypercholesteremia   . Hypertension     Past Surgical History:  Procedure Laterality Date  . COLONOSCOPY  2012   [facility]  . COLONOSCOPY WITH PROPOFOL N/A 09/01/2016   Procedure: COLONOSCOPY WITH PROPOFOL;  Surgeon: Robert Bellow, MD;  Location: [facility] ENDOSCOPY;  Service: Endoscopy;  Laterality: N/A;  . KNEE SURGERY Left   . SUBCLAVIAN ANGIOGRAM    . UPPER GI ENDOSCOPY  2012    Family History  Problem Relation Age of Onset  . Cancer Mother 18       colon  . Alzheimer's disease Mother   . Hypertension Mother   . Cancer Brother        prostate  . Lymphoma Son   . Cancer Son 64       lymphoma  . Hypertension Father   . Cerebral aneurysm Father   . Diabetes Sister   . Depression Sister   . Hypertension Sister   . Depression Sister   . Cancer Brother   . Gallbladder disease Brother   . Prostate cancer Brother     Social History   Social History  . Marital status: Widowed    Spouse name: N/A  . Number of children: N/A  . Years of education: N/A   Occupational History  . Not on file.   Social History Main Topics  . Smoking status: Never Smoker  . Smokeless tobacco: Never Used  . Alcohol use No  . Drug use: No  . Sexual activity: Not Currently   Other Topics Concern  . Not on file   Social History  Narrative  . No narrative on file     Current Outpatient Prescriptions:  .  alendronate (FOSAMAX) 70 MG tablet, Take 1 tablet (70 mg total) by mouth once a week. Take with a full glass of water on an empty stomach., Disp: 12 tablet, Rfl: 3 .  ALPHAGAN P 0.1 % SOLN, every 8 (eight) hours. , Disp: , Rfl:  .  aspirin 81 MG tablet, Take 1 tablet by mouth daily., Disp: , Rfl:  .  Light Mineral Oil-Mineral Oil (RETAINE MGD OP), Apply to eye., Disp: , Rfl:  .  losartan (COZAAR) 50 MG tablet, TAKE 1 TABLET (50 MG TOTAL) BY MOUTH DAILY., Disp: 30 tablet, Rfl: 5 .  LUMIGAN 0.01 % SOLN, Place 1 drop into  both eyes at bedtime., Disp: , Rfl: 1 .  mometasone (ELOCON) 0.1 % ointment, Apply topically daily., Disp: 45 g, Rfl: 0 .  omeprazole (PRILOSEC) 20 MG capsule, TAKE 1 CAPSULE (20 MG TOTAL) BY MOUTH 2 (TWO) TIMES DAILY., Disp: 180 capsule, Rfl: 0 .  pravastatin (PRAVACHOL) 20 MG tablet, TAKE 1 TABLET (20 MG TOTAL) BY MOUTH DAILY., Disp: 90 tablet, Rfl: 0 .  sertraline (ZOLOFT) 25 MG tablet, TAKE 0.5 TABLETS (12.5 MG TOTAL) BY MOUTH DAILY., Disp: 15 tablet, Rfl: 0 .  timolol (BETIMOL) 0.5 % ophthalmic solution, Place 1 drop into the left eye daily., Disp: , Rfl:  .  azithromycin (ZITHROMAX) 250 MG tablet, 2 tabs po day 1, then 1 tab po q day x 4 days (Patient not taking: Reported on 08/23/2017), Disp: 6 each, Rfl: 0 .  benzonatate (TESSALON) 100 MG capsule, TAKE 1 CAPSULE BY MOUTH THREE TIMES A DAY AS NEEDED, Disp: , Rfl: 0  Allergies  Allergen Reactions  . Codeine   . Combigan [Brimonidine Tartrate-Timolol]     drowsy  . Sulfa Antibiotics      Review of Systems  Constitutional: Negative for fever.  Eyes: Negative for blurred vision.  Respiratory: Negative for shortness of breath.   Cardiovascular: Negative for chest pain, palpitations and orthopnea.  Gastrointestinal: Positive for abdominal pain. Negative for diarrhea, nausea and vomiting.  Genitourinary: Negative for frequency.  Musculoskeletal: Negative for myalgias.  Neurological: Negative for headaches.     Objective  Vitals:   08/23/17 1419  BP: 130/76  Pulse: 100  Resp: 16  Temp: 98 F (36.7 C)  TempSrc: Oral  SpO2: 94%  Weight: 138 lb 6.4 oz (62.8 kg)  Height: 5\' 3"  (1.6 m)    Physical Exam  Constitutional: She is oriented to person, place, and time and well-developed, well-nourished, and in no distress.  HENT:  Head: Normocephalic.  Cardiovascular: Normal rate, regular rhythm and normal heart sounds.   No murmur heard. Pulmonary/Chest: Effort normal and breath sounds normal. She has no wheezes. Right breast  exhibits no inverted nipple, no mass and no nipple discharge. Left breast exhibits no inverted nipple, no mass and no nipple discharge. Breasts are symmetrical.  Abdominal: Soft. Bowel sounds are normal. There is tenderness in the right upper quadrant.    Musculoskeletal: She exhibits tenderness. She exhibits no edema.  Neurological: She is alert and oriented to person, place, and time.  Psychiatric: Mood, memory, affect and judgment normal.  Nursing note and vitals reviewed.     Recent Results (from the past 2160 hour(s))  POCT Influenza A/B     Status: Normal   Collection Time: 08/09/17  1:14 PM  Result Value Ref Range   Influenza A, POC Negative Negative  Influenza B, POC Negative Negative     Assessment & Plan  1. Age-related osteoporosis without current pathological fracture Continue Fosamax, repeat DEXA scan in April 2019 - alendronate (FOSAMAX) 70 MG tablet; Take 1 tablet (70 mg total) by mouth once a week. Take with a full glass of water on an empty stomach.  Dispense: 12 tablet; Refill: 3  2. Benign hypertension BP stable on present antihypertensive treatment - losartan (COZAAR) 50 MG tablet; Take 1 tablet (50 mg total) by mouth daily.  Dispense: 30 tablet; Refill: 5  3. Dyslipidemia Continue on statin - pravastatin (PRAVACHOL) 20 MG tablet; Take 1 tablet (20 mg total) by mouth daily.  Dispense: 90 tablet; Refill: 0 - Lipid panel - COMPLETE METABOLIC PANEL WITH GFR  4. RUQ abdominal pain Obtain right upper quadrant ultrasound for evaluation of intermittent chronic right upper quadrant discomfort - US Abdomen Limited RUQ; Future   5. Inconclusive mammogram  - MM Digital Diagnostic Bilat; Future - MM Digital Screening; Future - MM Digital Diagnostic Unilat L; Future - MM Digital Diagnostic Unilat R; Future Note; total face-to-face time 40 minutes, greater than 50% was spent in counseling and coordination of care with the patient. This included reviewing  multiple medical conditions, reviewing previous records for mammogram, and addressing an acute problem and ordering appropriate diagnostic workup.  Ayinde Swim Asad A. [facility] 08/23/2017 2:44 PM

## 2017-08-24 ENCOUNTER — Telehealth: Payer: Self-pay

## 2017-08-24 NOTE — Telephone Encounter (Signed)
I contacted this patient to inform her that she has been scheduled to have her [place] on Monday, August 30, 2017 @ 8:45am at the [facility], but there was no answer. A message was left for her with this information and the number to scheduling [phone]) in case the appt date and time is not good.

## 2017-08-30 ENCOUNTER — Ambulatory Visit
Admission: RE | Admit: 2017-08-30 | Discharge: 2017-08-30 | Disposition: A | Discharge status: Home / Self Care | Payer: Medicare HMO | Source: Ambulatory Visit | Attending: Family Medicine | Admitting: Family Medicine

## 2017-08-30 DIAGNOSIS — R1011 Right upper quadrant pain: Secondary | ICD-10-CM | POA: Diagnosis not present

## 2017-10-06 DIAGNOSIS — H401132 Primary open-angle glaucoma, bilateral, moderate stage: Secondary | ICD-10-CM | POA: Diagnosis not present

## 2017-10-15 ENCOUNTER — Ambulatory Visit (INDEPENDENT_AMBULATORY_CARE_PROVIDER_SITE_OTHER): Payer: Medicare HMO

## 2017-10-15 DIAGNOSIS — E785 Hyperlipidemia, unspecified: Secondary | ICD-10-CM | POA: Diagnosis not present

## 2017-10-15 DIAGNOSIS — Z23 Encounter for immunization: Secondary | ICD-10-CM

## 2017-10-15 LAB — COMPLETE METABOLIC PANEL WITH GFR
AG Ratio: 2 (calc) (ref 1.0–2.5)
ALT: 15 U/L (ref 6–29)
AST: 19 U/L (ref 10–35)
Albumin: 4 g/dL (ref 3.6–5.1)
Alkaline phosphatase (APISO): 40 U/L (ref 33–130)
BUN: 17 mg/dL (ref 7–25)
CO2: 29 mmol/L (ref 20–32)
Calcium: 9.1 mg/dL (ref 8.6–10.4)
Chloride: 107 mmol/L (ref 98–110)
Creat: 0.64 mg/dL (ref 0.60–0.93)
GFR, Est African American: 100 mL/min/{1.73_m2} (ref >=60)
GFR, Est Non African American: 86 mL/min/{1.73_m2} (ref >=60)
Globulin: 2 g/dL (calc) (ref 1.9–3.7)
Glucose, Bld: 78 mg/dL (ref 65–99)
Potassium: 3.9 mmol/L (ref 3.5–5.3)
Sodium: 144 mmol/L (ref 135–146)
Total Bilirubin: 0.7 mg/dL (ref 0.2–1.2)
Total Protein: 6 g/dL — ABNORMAL LOW (ref 6.1–8.1)

## 2017-10-15 LAB — LIPID PANEL
Cholesterol: 153 mg/dL (ref <200)
HDL: 74 mg/dL (ref >50)
LDL Cholesterol (Calc): 64 mg/dL (calc)
Non-HDL Cholesterol (Calc): 79 mg/dL (calc) (ref <130)
Total CHOL/HDL Ratio: 2.1 (calc) (ref <5.0)
Triglycerides: 74 mg/dL (ref <150)

## 2017-11-21 ENCOUNTER — Other Ambulatory Visit: Payer: Self-pay | Admitting: Family Medicine

## 2017-11-21 DIAGNOSIS — E785 Hyperlipidemia, unspecified: Secondary | ICD-10-CM

## 2017-11-24 ENCOUNTER — Encounter: Payer: Self-pay | Admitting: Family Medicine

## 2017-11-24 ENCOUNTER — Ambulatory Visit (INDEPENDENT_AMBULATORY_CARE_PROVIDER_SITE_OTHER): Payer: Medicare HMO | Admitting: Family Medicine

## 2017-11-24 VITALS — BP 122/82 | HR 78 | Temp 98.4°F | Resp 16 | Wt 142.2 lb

## 2017-11-24 DIAGNOSIS — I10 Essential (primary) hypertension: Secondary | ICD-10-CM | POA: Diagnosis not present

## 2017-11-24 DIAGNOSIS — E785 Hyperlipidemia, unspecified: Secondary | ICD-10-CM

## 2017-11-24 DIAGNOSIS — R1011 Right upper quadrant pain: Secondary | ICD-10-CM | POA: Diagnosis not present

## 2017-11-24 NOTE — Progress Notes (Signed)
Name: [name]   MRN: [id number]    DOB: 1941-11-03   Date:11/24/2017       Progress Note  Subjective  Chief Complaint  Chief Complaint  Patient presents with  . Follow-up    3 months  . Flank Pain    right side ongoing has already had imaging  . Hyperlipidemia    Hyperlipidemia  This is a chronic problem. The problem is controlled. Recent lipid tests were reviewed and are normal. Pertinent negatives include no chest pain, leg pain, myalgias or shortness of breath. Current antihyperlipidemic treatment includes statins. Risk factors for coronary artery disease include dyslipidemia.  Hypertension  This is a chronic problem. The problem is unchanged. The problem is controlled. Pertinent negatives include no blurred vision, chest pain, headaches, palpitations or shortness of breath. Past treatments include angiotensin blockers.     Past Medical History:  Diagnosis Date  . Anxiety   . Depression   . GERD (gastroesophageal reflux disease)   . Hypercholesteremia   . Hypertension     Past Surgical History:  Procedure Laterality Date  . COLONOSCOPY  2012   [facility]  . COLONOSCOPY WITH PROPOFOL N/A 09/01/2016   Procedure: COLONOSCOPY WITH PROPOFOL;  Surgeon: Robert Bellow, MD;  Location: [facility] ENDOSCOPY;  Service: Endoscopy;  Laterality: N/A;  . KNEE SURGERY Left   . SUBCLAVIAN ANGIOGRAM    . UPPER GI ENDOSCOPY  2012    Family History  Problem Relation Age of Onset  . Cancer Mother 11       colon  . Alzheimer's disease Mother   . Hypertension Mother   . Cancer Brother        prostate  . Lymphoma Son   . Cancer Son 48       lymphoma  . Hypertension Father   . Cerebral aneurysm Father   . Diabetes Sister   . Depression Sister   . Hypertension Sister   . Depression Sister   . Cancer Brother   . Gallbladder disease Brother   . Prostate cancer Brother     Social History   Socioeconomic History  . Marital status: Widowed    Spouse name: Not on file  .  Number of children: Not on file  . Years of education: Not on file  . Highest education level: Not on file  Social Needs  . Financial resource strain: Not on file  . Food insecurity - worry: Not on file  . Food insecurity - inability: Not on file  . Transportation needs - medical: Not on file  . Transportation needs - non-medical: Not on file  Occupational History  . Not on file  Tobacco Use  . Smoking status: Never Smoker  . Smokeless tobacco: Never Used  Substance and Sexual Activity  . Alcohol use: No    Alcohol/week: 0.0 oz  . Drug use: No  . Sexual activity: Not Currently  Other Topics Concern  . Not on file  Social History Narrative  . Not on file     Current Outpatient Medications:  .  alendronate (FOSAMAX) 70 MG tablet, Take 1 tablet (70 mg total) by mouth once a week. Take with a full glass of water on an empty stomach., Disp: 12 tablet, Rfl: 3 .  ALPHAGAN P 0.1 % SOLN, every 8 (eight) hours. , Disp: , Rfl:  .  aspirin 81 MG tablet, Take 1 tablet by mouth daily., Disp: , Rfl:  .  Light Mineral Oil-Mineral Oil (RETAINE MGD OP), Apply  to eye., Disp: , Rfl:  .  losartan (COZAAR) 50 MG tablet, Take 1 tablet (50 mg total) by mouth daily., Disp: 30 tablet, Rfl: 5 .  LUMIGAN 0.01 % SOLN, Place 1 drop into both eyes at bedtime., Disp: , Rfl: 1 .  mometasone (ELOCON) 0.1 % ointment, Apply topically daily., Disp: 45 g, Rfl: 0 .  omeprazole (PRILOSEC) 20 MG capsule, TAKE 1 CAPSULE (20 MG TOTAL) BY MOUTH 2 (TWO) TIMES DAILY., Disp: 180 capsule, Rfl: 0 .  pravastatin (PRAVACHOL) 20 MG tablet, TAKE 1 TABLET BY MOUTH EVERY DAY, Disp: 90 tablet, Rfl: 0 .  timolol (BETIMOL) 0.5 % ophthalmic solution, Place 1 drop into the left eye daily., Disp: , Rfl:  .  benzonatate (TESSALON) 100 MG capsule, TAKE 1 CAPSULE BY MOUTH THREE TIMES A DAY AS NEEDED, Disp: , Rfl: 0 .  sertraline (ZOLOFT) 25 MG tablet, TAKE 0.5 TABLETS (12.5 MG TOTAL) BY MOUTH DAILY. (Patient not taking: Reported on  11/24/2017), Disp: 15 tablet, Rfl: 0  Allergies  Allergen Reactions  . Codeine   . Combigan [Brimonidine Tartrate-Timolol]     drowsy  . Sulfa Antibiotics      Review of Systems  Eyes: Negative for blurred vision.  Respiratory: Negative for shortness of breath.   Cardiovascular: Negative for chest pain and palpitations.  Musculoskeletal: Negative for myalgias.  Neurological: Negative for headaches.     Objective  Vitals:   11/24/17 1447  BP: 122/82  Pulse: 78  Resp: 16  Temp: 98.4 F (36.9 C)  TempSrc: Oral  SpO2: 97%  Weight: 142 lb 3.2 oz (64.5 kg)    Physical Exam  Constitutional: She is oriented to person, place, and time and well-developed, well-nourished, and in no distress.  HENT:  Head: Normocephalic and atraumatic.  Cardiovascular: Normal rate, regular rhythm and normal heart sounds.  No murmur heard. Pulmonary/Chest: Effort normal and breath sounds normal. She has no wheezes.  Abdominal: Soft. Bowel sounds are normal. There is tenderness in the right upper quadrant.  Musculoskeletal: She exhibits no edema.  Neurological: She is alert and oriented to person, place, and time.  Psychiatric: Mood, memory, affect and judgment normal.  Nursing note and vitals reviewed.     Recent Results (from the past 2160 hour(s))  Lipid panel     Status: None   Collection Time: 10/15/17  8:25 AM  Result Value Ref Range   Cholesterol 153 <200 mg/dL   HDL 74 >50 mg/dL   Triglycerides 74 <150 mg/dL   LDL Cholesterol (Calc) 64 mg/dL (calc)    Comment: Reference range: <100 . Desirable range <100 mg/dL for primary prevention;   <70 mg/dL for patients with CHD or diabetic patients  with > or = 2 CHD risk factors. Marland Kitchen LDL-C is now calculated using the Martin-Hopkins  calculation, which is a validated novel method providing  better accuracy than the Friedewald equation in the  estimation of LDL-C.  Cresenciano Genre et al. Annamaria Helling. 2353;614(43): 2061-2068   (http://education.[facility].com/faq/FAQ164)    Total CHOL/HDL Ratio 2.1 <5.0 (calc)   Non-HDL Cholesterol (Calc) 79 <130 mg/dL (calc)    Comment: For patients with diabetes plus 1 major ASCVD risk  factor, treating to a non-HDL-C goal of <100 mg/dL  (LDL-C of <70 mg/dL) is considered a therapeutic  option.   COMPLETE METABOLIC PANEL WITH GFR     Status: Abnormal   Collection Time: 10/15/17  8:25 AM  Result Value Ref Range   Glucose, Bld 78 65 - 99 mg/dL  Comment: .            Fasting reference interval .    BUN 17 7 - 25 mg/dL   Creat 0.64 0.60 - 0.93 mg/dL    Comment: For patients >17 years of age, the reference limit for Creatinine is approximately 13% higher for people identified as African-American. .    GFR, Est Non African American 86 > OR = 60 mL/min/1.51m2   GFR, Est African American 100 > OR = 60 mL/min/1.69m2   BUN/Creatinine Ratio NOT APPLICABLE 6 - 22 (calc)   Sodium 144 135 - 146 mmol/L   Potassium 3.9 3.5 - 5.3 mmol/L   Chloride 107 98 - 110 mmol/L   CO2 29 20 - 32 mmol/L   Calcium 9.1 8.6 - 10.4 mg/dL   Total Protein 6.0 (L) 6.1 - 8.1 g/dL   Albumin 4.0 3.6 - 5.1 g/dL   Globulin 2.0 1.9 - 3.7 g/dL (calc)   AG Ratio 2.0 1.0 - 2.5 (calc)   Total Bilirubin 0.7 0.2 - 1.2 mg/dL   Alkaline phosphatase (APISO) 40 33 - 130 U/L   AST 19 10 - 35 U/L   ALT 15 6 - 29 U/L     Assessment & Plan  1. Essential hypertension Be stable on present antihypertensive treated  2. Dyslipidemia FLP reviewed from November 2018, it is at goal  3. RUQ abdominal pain Respect abdominal wall pain, ultrasound has been unremarkable, reassured   Franklin Clapsaddle Asad A. [facility] 11/24/2017 2:54 PM

## 2017-12-22 DIAGNOSIS — Z6825 Body mass index (BMI) 25.0-25.9, adult: Secondary | ICD-10-CM | POA: Diagnosis not present

## 2017-12-22 DIAGNOSIS — Z124 Encounter for screening for malignant neoplasm of cervix: Secondary | ICD-10-CM | POA: Diagnosis not present

## 2017-12-22 DIAGNOSIS — N8189 Other female genital prolapse: Secondary | ICD-10-CM | POA: Diagnosis not present

## 2018-01-10 ENCOUNTER — Encounter: Payer: Self-pay | Admitting: Family Medicine

## 2018-01-10 ENCOUNTER — Telehealth: Payer: Self-pay | Admitting: Family Medicine

## 2018-01-10 ENCOUNTER — Ambulatory Visit (INDEPENDENT_AMBULATORY_CARE_PROVIDER_SITE_OTHER): Payer: Medicare HMO | Admitting: Family Medicine

## 2018-01-10 VITALS — BP 142/82 | HR 100 | Temp 98.7°F | Resp 16 | Ht 63.0 in | Wt 141.5 lb

## 2018-01-10 DIAGNOSIS — J029 Acute pharyngitis, unspecified: Secondary | ICD-10-CM | POA: Diagnosis not present

## 2018-01-10 DIAGNOSIS — J209 Acute bronchitis, unspecified: Secondary | ICD-10-CM

## 2018-01-10 DIAGNOSIS — R05 Cough: Secondary | ICD-10-CM | POA: Diagnosis not present

## 2018-01-10 DIAGNOSIS — R059 Cough, unspecified: Secondary | ICD-10-CM

## 2018-01-10 MED ORDER — BENZONATATE 100 MG PO CAPS: 100.0000 mg | ORAL_CAPSULE | Freq: Three times a day (TID) | ORAL | 0 refills | Status: DC | PRN

## 2018-01-10 MED ORDER — DOXYCYCLINE HYCLATE 100 MG PO TABS: 100.0000 mg | ORAL_TABLET | Freq: Two times a day (BID) | ORAL | 0 refills | Status: AC

## 2018-01-10 MED ORDER — MAGIC MOUTHWASH W/LIDOCAINE: 5.0000 mL | Freq: Three times a day (TID) | ORAL | 0 refills | Status: DC | PRN

## 2018-01-10 NOTE — Patient Instructions (Addendum)
Cool Mist Vaporizer A cool mist vaporizer is a device that releases a cool mist into the air. If you have a cough or a cold, using a vaporizer may help relieve your symptoms. The mist adds moisture to the air, which may help thin your mucus and make it less sticky. When your mucus is thin and less sticky, it easier for you to breathe and to cough up secretions. Do not use a vaporizer if you are allergic to mold. Follow these instructions at home:  Follow the instructions that come with the vaporizer.  Do not use anything other than distilled water in the vaporizer.  Do not run the vaporizer all of the time. Doing that can cause mold or bacteria to grow in the vaporizer.  Clean the vaporizer after each time that you use it.  Clean and dry the vaporizer well before storing it.  Stop using the vaporizer if your breathing symptoms get worse. This information is not intended to replace advice given to you by your health care provider. Make sure you discuss any questions you have with your health care provider. Document Released: 07/30/2004 Document Revised: 05/22/2016 Document Reviewed: 02/01/2016 Elsevier Interactive Patient Education  2018 [employer].  Acute Bronchitis, Adult Acute bronchitis is when air tubes (bronchi) in the lungs suddenly get swollen. The condition can make it hard to breathe. It can also cause these symptoms:  A cough.  Coughing up clear, yellow, or green mucus.  Wheezing.  Chest congestion.  Shortness of breath.  A fever.  Body aches.  Chills.  A sore throat.  Follow these instructions at home: Medicines  Take over-the-counter and prescription medicines only as told by your doctor.  If you were prescribed an antibiotic medicine, take it as told by your doctor. Do not stop taking the antibiotic even if you start to feel better. General instructions  Rest.  Drink enough fluids to keep your pee (urine) clear or pale yellow.  Avoid smoking and  secondhand smoke. If you smoke and you need help quitting, ask your doctor. Quitting will help your lungs heal faster.  Use an inhaler, cool mist vaporizer, or humidifier as told by your doctor.  Keep all follow-up visits as told by your doctor. This is important. How is this prevented? To lower your risk of getting this condition again:  Wash your hands often with soap and water. If you cannot use soap and water, use hand sanitizer.  Avoid contact with people who have cold symptoms.  Try not to touch your hands to your mouth, nose, or eyes.  Make sure to get the flu shot every year.  Contact a doctor if:  Your symptoms do not get better in 2 weeks. Get help right away if:  You cough up blood.  You have chest pain.  You have very bad shortness of breath.  You become dehydrated.  You faint (pass out) or keep feeling like you are going to pass out.  You keep throwing up (vomiting).  You have a very bad headache.  Your fever or chills gets worse. This information is not intended to replace advice given to you by your health care provider. Make sure you discuss any questions you have with your health care provider. Document Released: 04/20/2008 Document Revised: 06/10/2016 Document Reviewed: 04/22/2016 Elsevier Interactive Patient Education  [employer].

## 2018-01-10 NOTE — Telephone Encounter (Signed)
Copied from [place]. Topic: Quick Communication - See Telephone Encounter >> Jan 10, 2018  5:18 PM Arletha Grippe wrote: CRM for notification. See Telephone encounter for:   01/10/18. [facility] called - they need clarification on the recipe for the magic mouthwash  - please call [phone]

## 2018-01-10 NOTE — Progress Notes (Signed)
Name: [name]   MRN: [id number]    DOB: January 12, 1941   Date:01/10/2018       Progress Note  Subjective  Chief Complaint  Chief Complaint  Patient presents with  . Sinusitis    cough, congested, phelgmwith streaks of blood for 2 days    HPI  Pt presents with concern for productive congestion, nasal congestion and rhinorrhea, itchy/full ears x3 days.  She endorses body aches, LEFT ribcage pain.  Denies NVD, fevers or chills, chest pain, shortness of breath. 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 - She also notes that she fell at school 2 weeks ago and hit her LEFT anterior ribcage - went to [facility] 12/31/2017 and was diagnosed with Rib contusion - no fracture.  Was given Norco and Naproxen.   Patient Active Problem List   Diagnosis Date Noted  . Pyuria 03/05/2017  . Acute maxillary sinusitis 10/21/2016  . Dermatitis 10/20/2016  . GERD (gastroesophageal reflux disease) 10/20/2016  . Encounter for screening colonoscopy 08/18/2016  . Well woman exam (no gynecological exam) 06/04/2016  . Anterior chest wall pain 03/23/2016  . Anxiety, generalized 01/28/2016  . BP (high blood pressure) 01/28/2016  . Osteoarthritis of right knee 12/25/2015  . Depression 09/05/2015  . Abnormal brain CT 06/20/2015  . Anxiety, mild 06/20/2015  . Blood in feces 06/20/2015  . Brain lesion 06/20/2015  . Complicated bereavement 29/47/6546  . Clinical depression 06/20/2015  . Dyslipidemia 06/20/2015  . Dizziness 06/20/2015  . Hyperlipidemia LDL goal <100 06/20/2015  . Benign hypertension 06/20/2015  . Affective disorder, major 06/20/2015  . Arthralgia of shoulder 06/20/2015  . Herpes zona 06/20/2015  . Breast disease 06/20/2015  . Pain in shoulder 06/20/2015  . Glaucoma 06/20/2015  . Annual physical exam 06/04/2015  . Post-menopausal 06/04/2015  . Bilateral leg weakness 06/04/2015    Social History   Tobacco Use  . Smoking status: Never Smoker  .  Smokeless tobacco: Never Used  Substance Use Topics  . Alcohol use: No    Alcohol/week: 0.0 oz     Current Outpatient Medications:  .  alendronate (FOSAMAX) 70 MG tablet, Take 1 tablet (70 mg total) by mouth once a week. Take with a full glass of water on an empty stomach., Disp: 12 tablet, Rfl: 3 .  ALPHAGAN P 0.1 % SOLN, every 8 (eight) hours. , Disp: , Rfl:  .  aspirin 81 MG tablet, Take 1 tablet by mouth daily., Disp: , Rfl:  .  Light Mineral Oil-Mineral Oil (RETAINE MGD OP), Apply to eye., Disp: , Rfl:  .  losartan (COZAAR) 50 MG tablet, Take 1 tablet (50 mg total) by mouth daily., Disp: 30 tablet, Rfl: 5 .  LUMIGAN 0.01 % SOLN, Place 1 drop into both eyes at bedtime., Disp: , Rfl: 1 .  mometasone (ELOCON) 0.1 % ointment, Apply topically daily., Disp: 45 g, Rfl: 0 .  omeprazole (PRILOSEC) 20 MG capsule, TAKE 1 CAPSULE (20 MG TOTAL) BY MOUTH 2 (TWO) TIMES DAILY., Disp: 180 capsule, Rfl: 0 .  pravastatin (PRAVACHOL) 20 MG tablet, TAKE 1 TABLET BY MOUTH EVERY DAY, Disp: 90 tablet, Rfl: 0 .  sertraline (ZOLOFT) 25 MG tablet, TAKE 0.5 TABLETS (12.5 MG TOTAL) BY MOUTH DAILY., Disp: 15 tablet, Rfl: 0 .  timolol (BETIMOL) 0.5 % ophthalmic solution, Place 1 drop into the left eye daily., Disp: , Rfl:  .  benzonatate (TESSALON) 100 MG capsule, TAKE 1 CAPSULE BY MOUTH THREE TIMES  A DAY AS NEEDED, Disp: , Rfl: 0  Allergies  Allergen Reactions  . Codeine   . Combigan [Brimonidine Tartrate-Timolol]     drowsy  . Sulfa Antibiotics     ROS  Ten systems reviewed and is negative except as mentioned in HPI  Objective  Vitals:   01/10/18 1328  BP: (!) 142/82  Pulse: 100  Resp: 16  Temp: 98.7 F (37.1 C)  TempSrc: Oral  SpO2: 95%  Weight: 141 lb 8 oz (64.2 kg)  Height: 5\' 3"  (1.6 m)   Body mass index is 25.07 kg/m.  Nursing Note and Vital Signs reviewed.  BP and HR likely slightly elevated due to coughing throughout examination and current illness.   Physical  Exam  Constitutional: Patient appears well-developed and well-nourished.  No distress.  HEENT: head atraumatic, normocephalic, pupils equal and reactive to light, EOM's intact, TM's without erythema or bulging, no maxillary or frontal sinus tenderness , neck supple with mild bilateral submandibular lymphadenopathy, oropharynx erythematous and moist without exudate Cardiovascular: Normal rate, regular rhythm, S1/S2 present.  No murmur or rub heard. No BLE edema. Pulmonary/Chest: Effort normal and breath sounds clear. No respiratory distress or retractions. Psychiatric: Patient has a normal mood and affect. behavior is normal. Judgment and thought content normal.  No results found for this or any previous visit (from the past 72 hour(s)).  Assessment & Plan  1. Acute bronchitis, unspecified organism - benzonatate (TESSALON) 100 MG capsule; Take 1 capsule (100 mg total) by mouth 3 (three) times daily as needed for cough.  Dispense: 30 capsule; Refill: 0 - doxycycline (VIBRA-TABS) 100 MG tablet; Take 1 tablet (100 mg total) by mouth 2 (two) times daily for 7 days.  Dispense: 14 tablet; Refill: 0  2. Sore throat - magic mouthwash w/lidocaine SOLN; Take 5 mLs by mouth 3 (three) times daily as needed (throat pain). Gargle and spit - do not swallow.  Dispense: 100 mL; Refill: 0  3. Cough - benzonatate (TESSALON) 100 MG capsule; Take 1 capsule (100 mg total) by mouth 3 (three) times daily as needed for cough.  Dispense: 30 capsule; Refill: 0  - Work note provided to return on 01/13/2018.  -Red flags and when to present for emergency care or RTC including fever >101.75F, chest pain, shortness of breath, new/worsening/un-resolving symptoms, reviewed with patient at time of visit. Follow up and care instructions discussed and provided in AVS.

## 2018-01-11 NOTE — Telephone Encounter (Signed)
Clint with [facility] Call back # [phone]  He received an update however the hydrocortisone is a tablet so he is needing additional clarification. Please call back.

## 2018-01-13 ENCOUNTER — Encounter: Payer: Self-pay | Admitting: Family Medicine

## 2018-01-13 ENCOUNTER — Ambulatory Visit (INDEPENDENT_AMBULATORY_CARE_PROVIDER_SITE_OTHER): Payer: Medicare HMO | Admitting: Family Medicine

## 2018-01-13 VITALS — BP 132/70 | HR 84 | Temp 98.7°F | Resp 16 | Ht 63.0 in | Wt 139.4 lb

## 2018-01-13 DIAGNOSIS — J209 Acute bronchitis, unspecified: Secondary | ICD-10-CM

## 2018-01-13 DIAGNOSIS — R05 Cough: Secondary | ICD-10-CM | POA: Diagnosis not present

## 2018-01-13 DIAGNOSIS — R0981 Nasal congestion: Secondary | ICD-10-CM | POA: Diagnosis not present

## 2018-01-13 DIAGNOSIS — R059 Cough, unspecified: Secondary | ICD-10-CM

## 2018-01-13 MED ORDER — GUAIFENESIN ER 600 MG PO TB12: 600.0000 mg | ORAL_TABLET | Freq: Two times a day (BID) | ORAL | 0 refills | Status: DC | PRN

## 2018-01-13 MED ORDER — FLUTICASONE PROPIONATE 50 MCG/ACT NA SUSP: 2.0000 | Freq: Every day | NASAL | 0 refills | Status: DC

## 2018-01-13 NOTE — Patient Instructions (Signed)
Cool Mist Vaporizer A cool mist vaporizer is a device that releases a cool mist into the air. If you have a cough or a cold, using a vaporizer may help relieve your symptoms. The mist adds moisture to the air, which may help thin your mucus and make it less sticky. When your mucus is thin and less sticky, it easier for you to breathe and to cough up secretions. Do not use a vaporizer if you are allergic to mold. Follow these instructions at home:  Follow the instructions that come with the vaporizer.  Do not use anything other than distilled water in the vaporizer.  Do not run the vaporizer all of the time. Doing that can cause mold or bacteria to grow in the vaporizer.  Clean the vaporizer after each time that you use it.  Clean and dry the vaporizer well before storing it.  Stop using the vaporizer if your breathing symptoms get worse. This information is not intended to replace advice given to you by your health care provider. Make sure you discuss any questions you have with your health care provider. Document Released: 07/30/2004 Document Revised: 05/22/2016 Document Reviewed: 02/01/2016 Elsevier Interactive Patient Education  2018 Elsevier Inc.  

## 2018-01-13 NOTE — Progress Notes (Signed)
Name: ALEXY BRINGLE   MRN: [id number]    DOB: 27-Jul-1941   Date:01/13/2018       Progress Note  Subjective  Chief Complaint  Chief Complaint  Patient presents with  . Follow-up    cough, head stuffy    HPI  PT presents to follow up on bronchitis - she has been taking doxycyline twice daily and is feeling better but still feeling weak and fatigued.  Notes a decreased appetite.  She was unable to fill the Magic Mouthwash, and her throat is feeling better now. She has been taking Tessalon for her cough with some good relief of her symptoms. Endorses nasal congestion, ongoing cough, mild nausea, headache; also had temp of 100F two days ago.  She denies shortness of breath, chest pain, chills, abdominal pain, vomiting, or diarrhea.  Patient Active Problem List   Diagnosis Date Noted  . Pyuria 03/05/2017  . Acute maxillary sinusitis 10/21/2016  . Dermatitis 10/20/2016  . GERD (gastroesophageal reflux disease) 10/20/2016  . Encounter for screening colonoscopy 08/18/2016  . Well woman exam (no gynecological exam) 06/04/2016  . Anterior chest wall pain 03/23/2016  . Anxiety, generalized 01/28/2016  . BP (high blood pressure) 01/28/2016  . Osteoarthritis of right knee 12/25/2015  . Depression 09/05/2015  . Abnormal brain CT 06/20/2015  . Anxiety, mild 06/20/2015  . Blood in feces 06/20/2015  . Brain lesion 06/20/2015  . Complicated bereavement 74/25/9563  . Clinical depression 06/20/2015  . Dyslipidemia 06/20/2015  . Dizziness 06/20/2015  . Hyperlipidemia LDL goal <100 06/20/2015  . Benign hypertension 06/20/2015  . Affective disorder, major 06/20/2015  . Arthralgia of shoulder 06/20/2015  . Herpes zona 06/20/2015  . Breast disease 06/20/2015  . Pain in shoulder 06/20/2015  . Glaucoma 06/20/2015  . Annual physical exam 06/04/2015  . Post-menopausal 06/04/2015  . Bilateral leg weakness 06/04/2015    Social History   Tobacco Use  . Smoking status: Never Smoker  .  Smokeless tobacco: Never Used  Substance Use Topics  . Alcohol use: No    Alcohol/week: 0.0 oz     Current Outpatient Medications:  .  alendronate (FOSAMAX) 70 MG tablet, Take 1 tablet (70 mg total) by mouth once a week. Take with a full glass of water on an empty stomach., Disp: 12 tablet, Rfl: 3 .  ALPHAGAN P 0.1 % SOLN, every 8 (eight) hours. , Disp: , Rfl:  .  aspirin 81 MG tablet, Take 1 tablet by mouth daily., Disp: , Rfl:  .  benzonatate (TESSALON) 100 MG capsule, Take 1 capsule (100 mg total) by mouth 3 (three) times daily as needed for cough., Disp: 30 capsule, Rfl: 0 .  Light Mineral Oil-Mineral Oil (RETAINE MGD OP), Apply to eye., Disp: , Rfl:  .  losartan (COZAAR) 50 MG tablet, Take 1 tablet (50 mg total) by mouth daily., Disp: 30 tablet, Rfl: 5 .  LUMIGAN 0.01 % SOLN, Place 1 drop into both eyes at bedtime., Disp: , Rfl: 1 .  magic mouthwash w/lidocaine SOLN, Take 5 mLs by mouth 3 (three) times daily as needed (throat pain). Gargle and spit - do not swallow., Disp: 100 mL, Rfl: 0 .  mometasone (ELOCON) 0.1 % ointment, Apply topically daily., Disp: 45 g, Rfl: 0 .  omeprazole (PRILOSEC) 20 MG capsule, TAKE 1 CAPSULE (20 MG TOTAL) BY MOUTH 2 (TWO) TIMES DAILY., Disp: 180 capsule, Rfl: 0 .  pravastatin (PRAVACHOL) 20 MG tablet, TAKE 1 TABLET BY MOUTH EVERY DAY, Disp: 90 tablet, Rfl:  0 .  sertraline (ZOLOFT) 25 MG tablet, TAKE 0.5 TABLETS (12.5 MG TOTAL) BY MOUTH DAILY., Disp: 15 tablet, Rfl: 0 .  timolol (BETIMOL) 0.5 % ophthalmic solution, Place 1 drop into the left eye daily., Disp: , Rfl:  .  doxycycline (VIBRA-TABS) 100 MG tablet, Take 1 tablet (100 mg total) by mouth 2 (two) times daily for 7 days. (Patient not taking: Reported on 01/13/2018), Disp: 14 tablet, Rfl: 0  Allergies  Allergen Reactions  . Codeine   . Combigan [Brimonidine Tartrate-Timolol]     drowsy  . Sulfa Antibiotics     ROS  Ten systems reviewed and is negative except as mentioned in  HPI  Objective  Vitals:   01/13/18 0934  BP: 132/70  Pulse: 84  Resp: 16  Temp: 98.7 F (37.1 C)  TempSrc: Oral  SpO2: 93%  Weight: 139 lb 6.4 oz (63.2 kg)  Height: 5\' 3"  (1.6 m)   Body mass index is 24.69 kg/m.  Nursing Note and Vital Signs reviewed.  Physical Exam  Constitutional: Patient appears well-developed and well-nourished.  No distress.  HEENT: head atraumatic, normocephalic, pupils equal and reactive to light, EOM's intact, TM's without erythema or bulging, no maxillary or frontal sinus tenderness , neck supple without lymphadenopathy, oropharynx pink and moist without exudate Cardiovascular: Normal rate, regular rhythm, S1/S2 present.  No murmur or rub heard. No BLE edema. Pulmonary/Chest: Effort normal and breath sounds clear. No respiratory distress or retractions. Psychiatric: Patient has a normal mood and affect. behavior is normal. Judgment and thought content normal.  No results found for this or any previous visit (from the past 72 hour(s)).  Assessment & Plan  1. Acute bronchitis, unspecified organism - Finish Doxycyline; Tessalon PRN for cough  2. Cough - Tessalon PRN for cough - guaiFENesin (MUCINEX) 600 MG 12 hr tablet; Take 1 tablet (600 mg total) by mouth 2 (two) times daily as needed.  Dispense: 20 tablet; Refill: 0  3. Nasal congestion - fluticasone (FLONASE) 50 MCG/ACT nasal spray; Place 2 sprays into both nostrils daily.  Dispense: 16 g; Refill: 0 - Drink plenty of water.  - Work note for today and tomorrow provided for patient.  -Red flags and when to present for emergency care or RTC including fever >101.55F, chest pain, shortness of breath, new/worsening/un-resolving symptoms, reviewed with patient at time of visit. Follow up and care instructions discussed and provided in AVS.

## 2018-01-13 NOTE — Telephone Encounter (Addendum)
Saw patient in office today.  Please notify [facility] of cancellation of Magic Mouthwash Order.

## 2018-01-13 NOTE — Telephone Encounter (Signed)
[facility] notified

## 2018-01-14 ENCOUNTER — Encounter: Payer: Self-pay | Admitting: Emergency Medicine

## 2018-01-25 ENCOUNTER — Other Ambulatory Visit: Payer: Self-pay | Admitting: Family Medicine

## 2018-01-25 DIAGNOSIS — K219 Gastro-esophageal reflux disease without esophagitis: Secondary | ICD-10-CM

## 2018-01-26 NOTE — Telephone Encounter (Signed)
Refill request for general medication. [facility] to [facility].   Last office visit 11/24/2017   Follow up on 03/02/2018

## 2018-02-03 ENCOUNTER — Ambulatory Visit (INDEPENDENT_AMBULATORY_CARE_PROVIDER_SITE_OTHER): Payer: Medicare HMO | Admitting: Family Medicine

## 2018-02-03 ENCOUNTER — Encounter: Payer: Self-pay | Admitting: Family Medicine

## 2018-02-03 VITALS — BP 138/84 | HR 91 | Temp 97.8°F | Resp 16 | Ht 63.0 in | Wt 139.3 lb

## 2018-02-03 DIAGNOSIS — R739 Hyperglycemia, unspecified: Secondary | ICD-10-CM | POA: Diagnosis not present

## 2018-02-03 DIAGNOSIS — I10 Essential (primary) hypertension: Secondary | ICD-10-CM | POA: Diagnosis not present

## 2018-02-03 DIAGNOSIS — R490 Dysphonia: Secondary | ICD-10-CM

## 2018-02-03 DIAGNOSIS — M810 Age-related osteoporosis without current pathological fracture: Secondary | ICD-10-CM | POA: Diagnosis not present

## 2018-02-03 DIAGNOSIS — E785 Hyperlipidemia, unspecified: Secondary | ICD-10-CM | POA: Diagnosis not present

## 2018-02-03 DIAGNOSIS — M1711 Unilateral primary osteoarthritis, right knee: Secondary | ICD-10-CM

## 2018-02-03 DIAGNOSIS — R05 Cough: Secondary | ICD-10-CM | POA: Diagnosis not present

## 2018-02-03 DIAGNOSIS — R059 Cough, unspecified: Secondary | ICD-10-CM

## 2018-02-03 MED ORDER — PREDNISONE 20 MG PO TABS: 20.0000 mg | ORAL_TABLET | Freq: Two times a day (BID) | ORAL | 0 refills | Status: DC

## 2018-02-03 MED ORDER — BENZONATATE 100 MG PO CAPS: 100.0000 mg | ORAL_CAPSULE | Freq: Three times a day (TID) | ORAL | 0 refills | Status: DC | PRN

## 2018-02-03 MED ORDER — PRAVASTATIN SODIUM 20 MG PO TABS: 20.0000 mg | ORAL_TABLET | Freq: Every day | ORAL | 5 refills | Status: DC

## 2018-02-03 MED ORDER — ALENDRONATE SODIUM 70 MG PO TABS: 70.0000 mg | ORAL_TABLET | ORAL | 3 refills | Status: DC

## 2018-02-03 MED ORDER — LOSARTAN POTASSIUM 50 MG PO TABS: 50.0000 mg | ORAL_TABLET | Freq: Every day | ORAL | 5 refills | Status: DC

## 2018-02-03 NOTE — Progress Notes (Signed)
Name: [name]   MRN: [id number]    DOB: 1941-04-01   Date:02/03/2018       Progress Note  Subjective  Chief Complaint  Chief Complaint  Patient presents with  . URI    Onset-4 days, symptoms include cough, congestion, horseness and feels bad.    HPI  Hoarseness: she states she developed a dry cough, some nasal congestion 4 days ago, followed by hoarseness, she denies fever, chills. She is feeling tired and has some nausea, but no change in bowel movements. She asked for antibiotics. She was treated for bronchitis about 3 weeks ago and was given doxycycline . Discussed likely viral Denies wheezing or SOB  Right knee pain: she has a long history of right knee pain, dull and achy, she states she has effusion at times.   HTN: she has been taking her medications, bp is borderline today, no chest pain or palpitation  Hyperlipidemia: last labs was 09/2017 and LDL is at goal with pravastatin, no side effects  Hyperglycemia: she has elevated hgbA1C at 5.8% back in 05/2015, advised to have it recheck with next labs. She denies polyphagia, polydipsia or polydipsia.    Patient Active Problem List   Diagnosis Date Noted  . Dermatitis 10/20/2016  . GERD (gastroesophageal reflux disease) 10/20/2016  . Anterior chest wall pain 03/23/2016  . Anxiety, generalized 01/28/2016  . BP (high blood pressure) 01/28/2016  . Osteoarthritis of right knee 12/25/2015  . Depression 09/05/2015  . Abnormal brain CT 06/20/2015  . Anxiety, mild 06/20/2015  . Blood in feces 06/20/2015  . Brain lesion 06/20/2015  . Complicated bereavement 57/84/6962  . Clinical depression 06/20/2015  . Dyslipidemia 06/20/2015  . Dizziness 06/20/2015  . Hyperlipidemia LDL goal <100 06/20/2015  . Benign hypertension 06/20/2015  . Affective disorder, major 06/20/2015  . Arthralgia of shoulder 06/20/2015  . Herpes zona 06/20/2015  . Breast disease 06/20/2015  . Pain in shoulder 06/20/2015  . Glaucoma 06/20/2015  .  Post-menopausal 06/04/2015  . Bilateral leg weakness 06/04/2015    Past Surgical History:  Procedure Laterality Date  . COLONOSCOPY  2012   [facility]  . COLONOSCOPY WITH PROPOFOL N/A 09/01/2016   Procedure: COLONOSCOPY WITH PROPOFOL;  Surgeon: Robert Bellow, MD;  Location: [facility] ENDOSCOPY;  Service: Endoscopy;  Laterality: N/A;  . KNEE SURGERY Left   . SUBCLAVIAN ANGIOGRAM    . UPPER GI ENDOSCOPY  2012    Family History  Problem Relation Age of Onset  . Cancer Mother 29       colon  . Alzheimer's disease Mother   . Hypertension Mother   . Cancer Brother        prostate  . Lymphoma Son   . Cancer Son 72       lymphoma  . Hypertension Father   . Cerebral aneurysm Father   . Diabetes Sister   . Depression Sister   . Hypertension Sister   . Depression Sister   . Cancer Brother   . Gallbladder disease Brother   . Prostate cancer Brother     Social History   Socioeconomic History  . Marital status: Widowed    Spouse name: Not on file  . Number of children: Not on file  . Years of education: Not on file  . Highest education level: Not on file  Occupational History  . Not on file  Social Needs  . Financial resource strain: Not on file  . Food insecurity:    Worry: Not on file  Inability: Not on file  . Transportation needs:    Medical: Not on file    Non-medical: Not on file  Tobacco Use  . Smoking status: Never Smoker  . Smokeless tobacco: Never Used  Substance and Sexual Activity  . Alcohol use: No    Alcohol/week: 0.0 oz  . Drug use: No  . Sexual activity: Not Currently  Lifestyle  . Physical activity:    Days per week: Not on file    Minutes per session: Not on file  . Stress: Not on file  Relationships  . Social connections:    Talks on phone: Not on file    Gets together: Not on file    Attends religious service: Not on file    Active member of club or organization: Not on file    Attends meetings of clubs or organizations: Not on file     Relationship status: Not on file  . Intimate partner violence:    Fear of current or ex partner: Not on file    Emotionally abused: Not on file    Physically abused: Not on file    Forced sexual activity: Not on file  Other Topics Concern  . Not on file  Social History Narrative  . Not on file     Current Outpatient Medications:  .  alendronate (FOSAMAX) 70 MG tablet, Take 1 tablet (70 mg total) by mouth once a week. Take with a full glass of water on an empty stomach., Disp: 12 tablet, Rfl: 3 .  ALPHAGAN P 0.1 % SOLN, every 8 (eight) hours. , Disp: , Rfl:  .  aspirin 81 MG tablet, Take 1 tablet by mouth daily., Disp: , Rfl:  .  benzonatate (TESSALON) 100 MG capsule, Take 1 capsule (100 mg total) by mouth 3 (three) times daily as needed for cough., Disp: 40 capsule, Rfl: 0 .  fluticasone (FLONASE) 50 MCG/ACT nasal spray, Place 2 sprays into both nostrils daily., Disp: 16 g, Rfl: 0 .  Light Mineral Oil-Mineral Oil (RETAINE MGD OP), Apply to eye., Disp: , Rfl:  .  losartan (COZAAR) 50 MG tablet, Take 1 tablet (50 mg total) by mouth daily., Disp: 30 tablet, Rfl: 5 .  LUMIGAN 0.01 % SOLN, Place 1 drop into both eyes at bedtime., Disp: , Rfl: 1 .  mometasone (ELOCON) 0.1 % ointment, Apply topically daily., Disp: 45 g, Rfl: 0 .  omeprazole (PRILOSEC) 20 MG capsule, TAKE 1 CAPSULE (20 MG TOTAL) BY MOUTH 2 (TWO) TIMES DAILY., Disp: 180 capsule, Rfl: 0 .  pravastatin (PRAVACHOL) 20 MG tablet, Take 1 tablet (20 mg total) by mouth daily., Disp: 30 tablet, Rfl: 5 .  timolol (BETIMOL) 0.5 % ophthalmic solution, Place 1 drop into the left eye daily., Disp: , Rfl:  .  predniSONE (DELTASONE) 20 MG tablet, Take 1 tablet (20 mg total) by mouth 2 (two) times daily with a meal., Disp: 6 tablet, Rfl: 0  Allergies  Allergen Reactions  . Codeine   . Combigan [Brimonidine Tartrate-Timolol]     drowsy  . Sulfa Antibiotics      ROS  Constitutional: Negative for fever or weight change.  Respiratory:  Positive  for cough but no shortness of breath.   Cardiovascular: Negative for chest pain or palpitations.  Gastrointestinal: Negative for abdominal pain, no bowel changes.  Musculoskeletal: Negative for gait problem , positive for right joint swelling.  Skin: Negative for rash.  Neurological: Negative for dizziness or headache.  No other specific complaints in  a complete review of systems (except as listed in HPI above).  Objective  Vitals:   02/03/18 1057  BP: 140/88  Pulse: 91  Resp: 16  Temp: 97.8 F (36.6 C)  TempSrc: Oral  SpO2: 95%  Weight: 139 lb 4.8 oz (63.2 kg)  Height: 5\' 3"  (1.6 m)    Body mass index is 24.68 kg/m.  Physical Exam  Constitutional: Patient appears well-developed and well-nourished. No distress.  HEENT: head atraumatic, normocephalic, pupils equal and reactive to light, ears normal TM bilaterally, normal turbinates, mild tenderness on both maxillary sinuses,  neck supple, throat within normal limits. No lymphadenopathy Cardiovascular: Normal rate, regular rhythm and normal heart sounds.  No murmur heard. No BLE edema. Pulmonary/Chest: Effort normal and breath sounds normal. No respiratory distress. Abdominal: Soft.  There is no tenderness. Psychiatric: Patient has a normal mood and affect. behavior is normal. Judgment and thought content normal.  PHQ2/9: Depression screen [facility] 2/9 11/24/2017 08/16/2017 05/26/2017 03/01/2017 12/01/2016  Decreased Interest 0 0 0 0 0  Down, Depressed, Hopeless 0 0 0 1 0  PHQ - 2 Score 0 0 0 1 0  Altered sleeping - - - - -  Tired, decreased energy - - - - -  Change in appetite - - - - -  Feeling bad or failure about yourself  - - - - -  Trouble concentrating - - - - -  Moving slowly or fidgety/restless - - - - -  Suicidal thoughts - - - - -  PHQ-9 Score - - - - -  Difficult doing work/chores - - - - -     Fall Risk: Fall Risk  11/24/2017 08/16/2017 05/26/2017 03/01/2017 12/01/2016  Falls in the past year? Yes No No No No   Number falls in past yr: 1 - - - -  Injury with Fall? No - - - -     Assessment & Plan  1. Cough  - benzonatate (TESSALON) 100 MG capsule; Take 1 capsule (100 mg total) by mouth 3 (three) times daily as needed for cough.  Dispense: 40 capsule; Refill: 0  2. Dyslipidemia  - pravastatin (PRAVACHOL) 20 MG tablet; Take 1 tablet (20 mg total) by mouth daily.  Dispense: 30 tablet; Refill: 5  3. Benign hypertension  - losartan (COZAAR) 50 MG tablet; Take 1 tablet (50 mg total) by mouth daily.  Dispense: 30 tablet; Refill: 5  4. Age-related osteoporosis without current pathological fracture  - alendronate (FOSAMAX) 70 MG tablet; Take 1 tablet (70 mg total) by mouth once a week. Take with a full glass of water on an empty stomach.  Dispense: 12 tablet; Refill: 3  5. Hoarseness  - predniSONE (DELTASONE) 20 MG tablet; Take 1 tablet (20 mg total) by mouth 2 (two) times daily with a meal.  Dispense: 6 tablet; Refill: 0  6. Primary osteoarthritis of right knee  Continue Tylenol    7. Hyperglycemia  We will recheck labs next visit

## 2018-02-07 ENCOUNTER — Telehealth: Payer: Self-pay

## 2018-02-07 NOTE — Telephone Encounter (Signed)
Called pt to confirm appt with [facility] w/ [facility] on 02/08/18. LVM requesting returned call.

## 2018-02-08 ENCOUNTER — Ambulatory Visit (INDEPENDENT_AMBULATORY_CARE_PROVIDER_SITE_OTHER): Payer: Medicare HMO

## 2018-02-08 VITALS — BP 132/70 | HR 80 | Temp 97.9°F | Resp 12 | Ht 63.0 in | Wt 140.1 lb

## 2018-02-08 DIAGNOSIS — Z0000 Encounter for general adult medical examination without abnormal findings: Secondary | ICD-10-CM | POA: Diagnosis not present

## 2018-02-08 DIAGNOSIS — Z1239 Encounter for other screening for malignant neoplasm of breast: Secondary | ICD-10-CM

## 2018-02-08 DIAGNOSIS — Z1231 Encounter for screening mammogram for malignant neoplasm of breast: Secondary | ICD-10-CM | POA: Diagnosis not present

## 2018-02-08 NOTE — Patient Instructions (Signed)
[name] , Thank you for taking time to come for your Medicare Wellness Visit. I appreciate your ongoing commitment to your health goals. Please review the following plan we discussed and let me know if I can assist you in the future.   Cancer Screenings: Colorectal Screening: Completed colonoscopy 09/01/16. Repeat every 5 years Mammogram: Completed 11/20/15. Repeat every year. Ordered today. Please call the number on the card provided to you today to schedule your appointment. Bone Density: Completed 12/30/16. Osteoporotic screening no longer required Lung Cancer Screening: (Low Dose CT Chest recommended if Age 22-80 years, 30 pack-year currently smoking OR have quit w/in 15years.) Does not qualify.   Additional Screening: Hepatitis C Screening: Does not qualify  Vision and Dental Exams: Recommended annual ophthalmology exams for early detection of glaucoma and other disorders of the eye Recommended annual dental exams for proper oral hygiene  Vaccinations: Influenza vaccine: Up to date Pneumococcal vaccine: Completed series Tdap vaccine: Up to date Shingles vaccine: Please call your insurance company to determine your out of pocket expense for the Shingrix vaccine. You may also receive this vaccine at your local pharmacy or Health Dept.  Advanced directives: Please bring a copy of your POA (Power of Attorney) and/or Living Will to your next appointment.  Conditions/risks identified: Recommend to drink at least 6-8 8oz glasses of water per day.  Next appointment: Please schedule your Annual Wellness Visit with your Nurse Health Advisor in one year.  Preventive Care 23 Years and Older, Female Preventive care refers to lifestyle choices and visits with your health care provider that can promote health and wellness. What does preventive care include?  A yearly physical exam. This is also called an annual well check.  Dental exams once or twice a year.  Routine eye exams. Ask your  health care provider how often you should have your eyes checked.  Personal lifestyle choices, including:  Daily care of your teeth and gums.  Regular physical activity.  Eating a healthy diet.  Avoiding tobacco and drug use.  Limiting alcohol use.  Practicing safe sex.  Taking low-dose aspirin every day.  Taking vitamin and mineral supplements as recommended by your health care provider. What happens during an annual well check? The services and screenings done by your health care provider during your annual well check will depend on your age, overall health, lifestyle risk factors, and family history of disease. Counseling  Your health care provider may ask you questions about your:  Alcohol use.  Tobacco use.  Drug use.  Emotional well-being.  Home and relationship well-being.  Sexual activity.  Eating habits.  History of falls.  Memory and ability to understand (cognition).  Work and work Statistician.  Reproductive health. Screening  You may have the following tests or measurements:  Height, weight, and BMI.  Blood pressure.  Lipid and cholesterol levels. These may be checked every 5 years, or more frequently if you are over 32 years old.  Skin check.  Lung cancer screening. You may have this screening every year starting at age 11 if you have a 30-pack-year history of smoking and currently smoke or have quit within the past 15 years.  Fecal occult blood test (FOBT) of the stool. You may have this test every year starting at age 24.  Flexible sigmoidoscopy or colonoscopy. You may have a sigmoidoscopy every 5 years or a colonoscopy every 10 years starting at age 63.  Hepatitis C blood test.  Hepatitis B blood test.  Sexually transmitted disease (  STD) testing.  Diabetes screening. This is done by checking your blood sugar (glucose) after you have not eaten for a while (fasting). You may have this done every 1-3 years.  Bone density scan. This is  done to screen for osteoporosis. You may have this done starting at age 70.  Mammogram. This may be done every 1-2 years. Talk to your health care provider about how often you should have regular mammograms. Talk with your health care provider about your test results, treatment options, and if necessary, the need for more tests. Vaccines  Your health care provider may recommend certain vaccines, such as:  Influenza vaccine. This is recommended every year.  Tetanus, diphtheria, and acellular pertussis (Tdap, Td) vaccine. You may need a Td booster every 10 years.  Zoster vaccine. You may need this after age 81.  Pneumococcal 13-valent conjugate (PCV13) vaccine. One dose is recommended after age 41.  Pneumococcal polysaccharide (PPSV23) vaccine. One dose is recommended after age 84. Talk to your health care provider about which screenings and vaccines you need and how often you need them. This information is not intended to replace advice given to you by your health care provider. Make sure you discuss any questions you have with your health care provider. Document Released: 11/29/2015 Document Revised: 07/22/2016 Document Reviewed: 09/03/2015 Elsevier Interactive Patient Education  2017 [employer] Prevention in the Home Falls can cause injuries. They can happen to people of all ages. There are many things you can do to make your home safe and to help prevent falls. What can I do on the outside of my home?  Regularly fix the edges of walkways and driveways and fix any cracks.  Remove anything that might make you trip as you walk through a door, such as a raised step or threshold.  Trim any bushes or trees on the path to your home.  Use bright outdoor lighting.  Clear any walking paths of anything that might make someone trip, such as rocks or tools.  Regularly check to see if handrails are loose or broken. Make sure that both sides of any steps have handrails.  Any raised  decks and porches should have guardrails on the edges.  Have any leaves, snow, or ice cleared regularly.  Use sand or salt on walking paths during winter.  Clean up any spills in your garage right away. This includes oil or grease spills. What can I do in the bathroom?  Use night lights.  Install grab bars by the toilet and in the tub and shower. Do not use towel bars as grab bars.  Use non-skid mats or decals in the tub or shower.  If you need to sit down in the shower, use a plastic, non-slip stool.  Keep the floor dry. Clean up any water that spills on the floor as soon as it happens.  Remove soap buildup in the tub or shower regularly.  Attach bath mats securely with double-sided non-slip rug tape.  Do not have throw rugs and other things on the floor that can make you trip. What can I do in the bedroom?  Use night lights.  Make sure that you have a light by your bed that is easy to reach.  Do not use any sheets or blankets that are too big for your bed. They should not hang down onto the floor.  Have a firm chair that has side arms. You can use this for support while you get dressed.  Do  not have throw rugs and other things on the floor that can make you trip. What can I do in the kitchen?  Clean up any spills right away.  Avoid walking on wet floors.  Keep items that you use a lot in easy-to-reach places.  If you need to reach something above you, use a strong step stool that has a grab bar.  Keep electrical cords out of the way.  Do not use floor polish or wax that makes floors slippery. If you must use wax, use non-skid floor wax.  Do not have throw rugs and other things on the floor that can make you trip. What can I do with my stairs?  Do not leave any items on the stairs.  Make sure that there are handrails on both sides of the stairs and use them. Fix handrails that are broken or loose. Make sure that handrails are as long as the stairways.  Check  any carpeting to make sure that it is firmly attached to the stairs. Fix any carpet that is loose or worn.  Avoid having throw rugs at the top or bottom of the stairs. If you do have throw rugs, attach them to the floor with carpet tape.  Make sure that you have a light switch at the top of the stairs and the bottom of the stairs. If you do not have them, ask someone to add them for you. What else can I do to help prevent falls?  Wear shoes that:  Do not have high heels.  Have rubber bottoms.  Are comfortable and fit you well.  Are closed at the toe. Do not wear sandals.  If you use a stepladder:  Make sure that it is fully opened. Do not climb a closed stepladder.  Make sure that both sides of the stepladder are locked into place.  Ask someone to hold it for you, if possible.  Clearly mark and make sure that you can see:  Any grab bars or handrails.  First and last steps.  Where the edge of each step is.  Use tools that help you move around (mobility aids) if they are needed. These include:  Canes.  Walkers.  Scooters.  Crutches.  Turn on the lights when you go into a dark area. Replace any light bulbs as soon as they burn out.  Set up your furniture so you have a clear path. Avoid moving your furniture around.  If any of your floors are uneven, fix them.  If there are any pets around you, be aware of where they are.  Review your medicines with your doctor. Some medicines can make you feel dizzy. This can increase your chance of falling. Ask your doctor what other things that you can do to help prevent falls. This information is not intended to replace advice given to you by your health care provider. Make sure you discuss any questions you have with your health care provider. Document Released: 08/29/2009 Document Revised: 04/09/2016 Document Reviewed: 12/07/2014 Elsevier Interactive Patient Education  2017 [employer].

## 2018-02-08 NOTE — Progress Notes (Signed)
Subjective:   [name] is a 77 y.o. female who presents for Medicare Annual (Subsequent) preventive examination.  Review of Systems:  N/A Cardiac Risk Factors include: advanced age (>10men, >65 women);dyslipidemia;hypertension     Objective:     Vitals: BP 132/70 (BP Location: Left Arm, Patient Position: Sitting, Cuff Size: Normal)   Pulse 80   Temp 97.9 F (36.6 C) (Oral)   Resp 12   Ht 5\' 3"  (1.6 m)   Wt 140 lb 1.6 oz (63.5 kg)   SpO2 97%   BMI 24.82 kg/m   Body mass index is 24.82 kg/m.  Advanced Directives 02/08/2018 08/09/2017 05/26/2017 03/01/2017 01/13/2017 12/01/2016 10/20/2016  Does Patient Have a Medical Advance Directive? Yes No No Yes No No No  Type of Paramedic of [place];Living will - - Living will;Healthcare Power of Attorney - - -  Does patient want to make changes to medical advance directive? - - - - - - -  Copy of [place] in Chart? No - copy requested - - No - copy requested - - -  Would patient like information on creating a medical advance directive? - - - - - - -  Some encounter information is confidential and restricted. Go to Review Flowsheets activity to see all data.    Tobacco Social History   Tobacco Use  Smoking Status Never Smoker  Smokeless Tobacco Never Used  Tobacco Comment   smoking cessation materials not required     Counseling given: No Comment: smoking cessation materials not required   Clinical Intake:  Pre-visit preparation completed: Yes  Pain : No/denies pain   BMI - recorded: 24.82 Nutritional Status: BMI of 19-24  Normal  Nutrition Risk Assessment: Has the patient had any N/V/D within the last 2 months?  No Does the patient have any non-healing wounds?  No Has the patient had any unintentional weight loss or weight gain?  No  Is the patient diabetic?  No If diabetic, was a CBG obtained today?  No Did the patient bring in their glucometer from home?   N/A Comments: N/A  How often do you need to have someone help you when you read instructions, pamphlets, or other written materials from your doctor or pharmacy?: 1 - Never  Interpreter Needed?: No  Information entered by :: AEversole, LPN  Hospitalizations, surgeries, and ER visits occurring within the previous 12 months:  Within the previous 12 months, pt has not underwent any surgical procedures, has not been hospitalized for any conditions and has not been treated by an emergency room clinician.  Past Medical History:  Diagnosis Date  . Anxiety   . Depression   . GERD (gastroesophageal reflux disease)   . Hypercholesteremia   . Hypertension   . Vertigo    Past Surgical History:  Procedure Laterality Date  . COLONOSCOPY  2012   [facility]  . COLONOSCOPY WITH PROPOFOL N/A 09/01/2016   Procedure: COLONOSCOPY WITH PROPOFOL;  Surgeon: Robert Bellow, MD;  Location: [facility] ENDOSCOPY;  Service: Endoscopy;  Laterality: N/A;  . KNEE SURGERY Left   . SUBCLAVIAN ANGIOGRAM    . UPPER GI ENDOSCOPY  2012   Family History  Problem Relation Age of Onset  . Cancer Mother 67       colon  . Alzheimer's disease Mother   . Hypertension Mother   . Cancer Brother        prostate  . Lymphoma Son   . Cancer Son 22  lymphoma  . Hypertension Father   . Cerebral aneurysm Father   . Diabetes Sister   . Depression Sister   . Hypertension Sister   . Depression Sister   . Cancer Brother   . Gallbladder disease Brother   . Prostate cancer Brother    Social History   Socioeconomic History  . Marital status: Widowed    Spouse name: [name]  . Number of children: 3  . Years of education: Not on file  . Highest education level: 12th grade  Occupational History    Employer: [employer]  Social Needs  . Financial resource strain: Not hard at all  . Food insecurity:    Worry: Never true    Inability: Never true  . Transportation needs:    Medical: No    Non-medical: No  Tobacco Use   . Smoking status: Never Smoker  . Smokeless tobacco: Never Used  . Tobacco comment: smoking cessation materials not required  Substance and Sexual Activity  . Alcohol use: No    Alcohol/week: 0.0 oz  . Drug use: No  . Sexual activity: Not Currently  Lifestyle  . Physical activity:    Days per week: 3 days    Minutes per session: 60 min  . Stress: Only a little  Relationships  . Social connections:    Talks on phone: Patient refused    Gets together: Patient refused    Attends religious service: Patient refused    Active member of club or organization: Patient refused    Attends meetings of clubs or organizations: Patient refused    Relationship status: Widowed  Other Topics Concern  . Not on file  Social History Narrative  . Not on file    Outpatient Encounter Medications as of 02/08/2018  Medication Sig  . alendronate (FOSAMAX) 70 MG tablet Take 1 tablet (70 mg total) by mouth once a week. Take with a full glass of water on an empty stomach.  . ALPHAGAN P 0.1 % SOLN every 8 (eight) hours.   Marland Kitchen aspirin 81 MG tablet Take 1 tablet by mouth daily.  . benzonatate (TESSALON) 100 MG capsule Take 1 capsule (100 mg total) by mouth 3 (three) times daily as needed for cough.  . fluticasone (FLONASE) 50 MCG/ACT nasal spray Place 2 sprays into both nostrils daily.  Sunday Corn Mineral Oil-Mineral Oil (RETAINE MGD OP) Apply to eye.  . losartan (COZAAR) 50 MG tablet Take 1 tablet (50 mg total) by mouth daily.  Marland Kitchen LUMIGAN 0.01 % SOLN Place 1 drop into both eyes at bedtime.  . mometasone (ELOCON) 0.1 % ointment Apply topically daily.  Marland Kitchen omeprazole (PRILOSEC) 20 MG capsule TAKE 1 CAPSULE (20 MG TOTAL) BY MOUTH 2 (TWO) TIMES DAILY.  . pravastatin (PRAVACHOL) 20 MG tablet Take 1 tablet (20 mg total) by mouth daily.  . predniSONE (DELTASONE) 20 MG tablet Take 1 tablet (20 mg total) by mouth 2 (two) times daily with a meal.  . [DISCONTINUED] timolol (BETIMOL) 0.5 % ophthalmic solution Place 1 drop  into the left eye daily.   No facility-administered encounter medications on file as of 02/08/2018.     Activities of Daily Living In your present state of health, do you have any difficulty performing the following activities: 02/08/2018 11/24/2017  Hearing? N N  Comment denies hearing aids -  Vision? N Y  Comment wears eyeglasses -  Difficulty concentrating or making decisions? N N  Walking or climbing stairs? Y N  Comment pain R knee -  Dressing or bathing? N N  Doing errands, shopping? N N  Preparing Food and eating ? N -  Comment denies dentures -  Using the Toilet? N -  In the past six months, have you accidently leaked urine? N -  Do you have problems with loss of bowel control? N -  Managing your Medications? N -  Managing your Finances? N -  Housekeeping or managing your Housekeeping? N -  Some recent data might be hidden    Patient Care Team: Steele Sizer, MD as PCP - General (Family Medicine) Dingeldein, Remo Lipps, MD as Consulting Physician (Ophthalmology) Louretta Shorten, MD as Consulting Physician (Obstetrics and Gynecology)    Assessment:   This is a routine wellness examination for Anyi.  Exercise Activities and Dietary recommendations Current Exercise Habits: Structured exercise class, Type of exercise: strength training/weights;Other - see comments(dancing), Time (Minutes): 60, Frequency (Times/Week): 3, Weekly Exercise (Minutes/Week): 180, Intensity: Mild, Exercise limited by: None identified  Goals    . DIET - INCREASE WATER INTAKE     Recommend to drink at least 6-8 8oz glasses of water per day.       Fall Risk Fall Risk  02/08/2018 11/24/2017 08/16/2017 05/26/2017 03/01/2017  Falls in the past year? Yes Yes No No No  Comment missed step when walking down stairs - - - -  Number falls in past yr: 1 1 - - -  Injury with Fall? No No - - -  Risk for fall due to : History of fall(s);Impaired vision - - - -  Risk for fall due to: Comment vertigo; glaucoma; wears  eyeglasses - - - -  Follow up Falls evaluation completed;Education provided;Falls prevention discussed - - - -   Is the home free of loose throw rugs in walkways, pet beds, electrical cords, etc? Yes Adequate lighting to reduce risk of falls?  Yes In addition, does the patient have any of the following: Stairs in or around the home WITH handrails? Yes Grab bars in the bathroom? No  Shower chair or a place to sit while bathing? No Use of a cane, walker or w/c? No Use of an elevated toilet seat or a handicapped toilet? No  Timed Get Up and Go Performed: Yes. Pt ambulated 10 feet within 5 sec. Gait stead-fast and without the use of an assistive device. No intervention required at this time. Fall risk prevention has been discussed.  Pt declined my offer to send Community Resource Referral to Care Guide for installation of grab bars in the shower, shower chair or an elevated toilet seat.  Depression Screen PHQ 2/9 Scores 02/08/2018 11/24/2017 08/16/2017 05/26/2017  PHQ - 2 Score 2 0 0 0  PHQ- 9 Score 3 - - -     Cognitive Function     6CIT Screen 02/08/2018 03/01/2017  What Year? 0 points 0 points  What month? 0 points 0 points  What time? 0 points 0 points  Count back from 20 0 points 0 points  Months in reverse 0 points 0 points  Repeat phrase 0 points 4 points  Total Score 0 4    Immunization History  Administered Date(s) Administered  . Influenza, High Dose Seasonal PF 12/25/2015, 10/20/2016, 10/15/2017  . Influenza,inj,Quad PF,6+ Mos 09/04/2014  . Pneumococcal Conjugate-13 03/01/2017  . Pneumococcal Polysaccharide-23 05/14/2014  . Tdap 11/17/2011    Qualifies for Shingles Vaccine? Yes. Due for Zostavax or Shingrix vaccine. Education has been provided regarding the importance of this vaccine. Pt has been advised to  call her insurance company to determine her out of pocket expense. Advised she may also receive this vaccine at her local pharmacy or Health Dept. Verbalized acceptance  and understanding.  Screening Tests Health Maintenance  Topic Date Due  . TETANUS/TDAP  11/16/2021  . INFLUENZA VACCINE  Completed  . DEXA SCAN  Completed  . PNA vac Low Risk Adult  Completed    Cancer Screenings: Colorectal Screening: Completed colonoscopy 09/01/16. Repeat every 5 years Mammogram: Completed 11/20/15. Repeat every year. Ordered today. Pt provided with contact information and advised to schedule her appt at her earliest convenience. Verbalized acceptance and understanding. Bone Density: Completed 12/30/16. Osteoporotic screening no longer required Lung Cancer Screening: (Low Dose CT Chest recommended if Age 28-80 years, 30 pack-year currently smoking OR have quit w/in 15years.) Does not qualify.   Additional Screening: Hepatitis C Screening: Does not qualify  Vision and Dental Exams: Recommended annual ophthalmology exams for early detection of glaucoma and other disorders of the eye Recommended annual dental exams for proper oral hygiene     Plan:  I have personally reviewed and addressed the Medicare Annual Wellness questionnaire and have noted the following in the patient's chart:  A. Medical and social history B. Use of alcohol, tobacco or illicit drugs  C. Current medications and supplements D. Functional ability and status E.  Nutritional status F.  Physical activity G. Advance directives H. List of other physicians I.  Hospitalizations, surgeries, and ER visits in previous 12 months J.  [place] such as hearing and vision if needed, cognitive and depression L. Referrals and appointments - none  In addition, I have reviewed and discussed with patient certain preventive protocols, quality metrics, and best practice recommendations. A written personalized care plan for preventive services as well as general preventive health recommendations were provided to patient.  See attached scanned questionnaire for additional information.   Signed,    Aleatha Borer, LPN Nurse Health Advisor

## 2018-02-23 DIAGNOSIS — N760 Acute vaginitis: Secondary | ICD-10-CM | POA: Diagnosis not present

## 2018-03-02 ENCOUNTER — Encounter: Payer: Self-pay | Admitting: Family Medicine

## 2018-03-02 ENCOUNTER — Ambulatory Visit (INDEPENDENT_AMBULATORY_CARE_PROVIDER_SITE_OTHER): Payer: Medicare HMO | Admitting: Family Medicine

## 2018-03-02 VITALS — BP 140/70 | HR 80 | Resp 16 | Ht 63.0 in | Wt 139.8 lb

## 2018-03-02 DIAGNOSIS — I10 Essential (primary) hypertension: Secondary | ICD-10-CM

## 2018-03-02 DIAGNOSIS — F339 Major depressive disorder, recurrent, unspecified: Secondary | ICD-10-CM | POA: Insufficient documentation

## 2018-03-02 DIAGNOSIS — R739 Hyperglycemia, unspecified: Secondary | ICD-10-CM

## 2018-03-02 DIAGNOSIS — K219 Gastro-esophageal reflux disease without esophagitis: Secondary | ICD-10-CM | POA: Diagnosis not present

## 2018-03-02 DIAGNOSIS — M810 Age-related osteoporosis without current pathological fracture: Secondary | ICD-10-CM | POA: Diagnosis not present

## 2018-03-02 DIAGNOSIS — R9089 Other abnormal findings on diagnostic imaging of central nervous system: Secondary | ICD-10-CM

## 2018-03-02 DIAGNOSIS — E785 Hyperlipidemia, unspecified: Secondary | ICD-10-CM

## 2018-03-02 DIAGNOSIS — F3342 Major depressive disorder, recurrent, in full remission: Secondary | ICD-10-CM | POA: Insufficient documentation

## 2018-03-02 NOTE — Progress Notes (Signed)
Name: [name]   MRN: [id number]    DOB: Apr 02, 1941   Date:03/02/2018       Progress Note  Subjective  Chief Complaint  Chief Complaint  Patient presents with  . Anxiety  . Depression  . Hyperlipidemia  . Hypertension    HPI  Major Depression and anxiety: she states that her son died in 9390 from complications of lymphoma at age 78 yo. She states she felt like part of her died at the same time. She states she was able to coe off medication after 3 years. She still missed him, but is doing much better, she still feels down at times, but denies suicidal thoughts or ideation.   HTN: she states bp is usually at goal , it was 132/70 on her last visit, no chest pain or palpitation. Compliant with medication  GERD: taking medication, discussed long term medication use - alzheimer's dementia, bone loss, and colitis. She will try to wean self off medication  Abnormal brain CT: reviewed results, it does not look like she had MRI done as suggested, she is willing to have it done now. No headaches, or any neuro deficit.   Dyslipidemia: last labs reviewed, on pravastatin, no myalgia  Osteoporosis: explained importance of dairy intake and taking fosamax daily    Patient Active Problem List   Diagnosis Date Noted  . Recurrent major depressive disorder, in full remission ([place]) 03/02/2018  . Dermatitis 10/20/2016  . GERD (gastroesophageal reflux disease) 10/20/2016  . Anxiety, generalized 01/28/2016  . Osteoarthritis of right knee 12/25/2015  . Depression 09/05/2015  . Abnormal brain CT 06/20/2015  . Anxiety, mild 06/20/2015  . Brain lesion 06/20/2015  . Clinical depression 06/20/2015  . Dyslipidemia 06/20/2015  . Hyperlipidemia LDL goal <100 06/20/2015  . Benign hypertension 06/20/2015  . Affective disorder, major 06/20/2015  . Arthralgia of shoulder 06/20/2015  . Glaucoma 06/20/2015  . Post-menopausal 06/04/2015  . Bilateral leg weakness 06/04/2015    Past Surgical  History:  Procedure Laterality Date  . COLONOSCOPY  2012   [facility]  . COLONOSCOPY WITH PROPOFOL N/A 09/01/2016   Procedure: COLONOSCOPY WITH PROPOFOL;  Surgeon: Robert Bellow, MD;  Location: [facility] ENDOSCOPY;  Service: Endoscopy;  Laterality: N/A;  . KNEE SURGERY Left   . SUBCLAVIAN ANGIOGRAM    . UPPER GI ENDOSCOPY  2012    Family History  Problem Relation Age of Onset  . Cancer Mother 13       colon  . Alzheimer's disease Mother   . Hypertension Mother   . Cancer Brother        prostate  . Lymphoma Son   . Cancer Son 56       lymphoma  . Hypertension Father   . Cerebral aneurysm Father   . Diabetes Sister   . Depression Sister   . Hypertension Sister   . Depression Sister   . Cancer Brother   . Gallbladder disease Brother   . Prostate cancer Brother     Social History   Socioeconomic History  . Marital status: Widowed    Spouse name: [name]  . Number of children: 3  . Years of education: Not on file  . Highest education level: 12th grade  Occupational History    Employer: [employer]  Social Needs  . Financial resource strain: Not hard at all  . Food insecurity:    Worry: Never true    Inability: Never true  . Transportation needs:    Medical: No  Non-medical: No  Tobacco Use  . Smoking status: Never Smoker  . Smokeless tobacco: Never Used  . Tobacco comment: smoking cessation materials not required  Substance and Sexual Activity  . Alcohol use: No    Alcohol/week: 0.0 oz  . Drug use: No  . Sexual activity: Not Currently  Lifestyle  . Physical activity:    Days per week: 3 days    Minutes per session: 60 min  . Stress: Only a little  Relationships  . Social connections:    Talks on phone: Patient refused    Gets together: Patient refused    Attends religious service: Patient refused    Active member of club or organization: Patient refused    Attends meetings of clubs or organizations: Patient refused    Relationship status: Widowed  .  Intimate partner violence:    Fear of current or ex partner: No    Emotionally abused: No    Physically abused: No    Forced sexual activity: No  Other Topics Concern  . Not on file  Social History Narrative  . Not on file     Current Outpatient Medications:  .  alendronate (FOSAMAX) 70 MG tablet, Take 1 tablet (70 mg total) by mouth once a week. Take with a full glass of water on an empty stomach., Disp: 12 tablet, Rfl: 3 .  ALPHAGAN P 0.1 % SOLN, every 8 (eight) hours. , Disp: , Rfl:  .  Light Mineral Oil-Mineral Oil (RETAINE MGD OP), Apply to eye., Disp: , Rfl:  .  losartan (COZAAR) 50 MG tablet, Take 1 tablet (50 mg total) by mouth daily., Disp: 30 tablet, Rfl: 5 .  LUMIGAN 0.01 % SOLN, Place 1 drop into both eyes at bedtime., Disp: , Rfl: 1 .  mometasone (ELOCON) 0.1 % ointment, Apply topically daily., Disp: 45 g, Rfl: 0 .  omeprazole (PRILOSEC) 20 MG capsule, TAKE 1 CAPSULE (20 MG TOTAL) BY MOUTH 2 (TWO) TIMES DAILY., Disp: 180 capsule, Rfl: 0 .  pravastatin (PRAVACHOL) 20 MG tablet, Take 1 tablet (20 mg total) by mouth daily., Disp: 30 tablet, Rfl: 5 .  fluticasone (FLONASE) 50 MCG/ACT nasal spray, Place 2 sprays into both nostrils daily. (Patient not taking: Reported on 03/02/2018), Disp: 16 g, Rfl: 0  Allergies  Allergen Reactions  . Codeine   . Combigan [Brimonidine Tartrate-Timolol]     drowsy  . Sulfa Antibiotics      ROS  Constitutional: Negative for fever or weight change.  Respiratory: Negative for cough and shortness of breath.   Cardiovascular: Negative for chest pain or palpitations.  Gastrointestinal: Negative for abdominal pain, no bowel changes.  Musculoskeletal: Negative for gait problem or joint swelling.  Skin: Negative for rash.  Neurological: Negative for dizziness or headache.  No other specific complaints in a complete review of systems (except as listed in HPI above).  Objective  Vitals:   03/02/18 1502  BP: (!) 150/80  Pulse: 80  Resp: 16   SpO2: 97%  Weight: 139 lb 12.8 oz (63.4 kg)  Height: 5\' 3"  (1.6 m)    Body mass index is 24.76 kg/m.  Physical Exam  Constitutional: Patient appears well-developed and well-nourished. No distress.  HEENT: head atraumatic, normocephalic, pupils equal and reactive to light, neck supple, throat within normal limits Cardiovascular: Normal rate, regular rhythm and normal heart sounds.  No murmur heard. No BLE edema. Pulmonary/Chest: Effort normal and breath sounds normal. No respiratory distress. Abdominal: Soft.  There is no tenderness. Psychiatric:  Patient has a normal mood and affect. behavior is normal. Judgment and thought content normal.  PHQ2/9: Depression screen [facility] 2/9 03/02/2018 02/08/2018 11/24/2017 08/16/2017 05/26/2017  Decreased Interest 0 1 0 0 0  Down, Depressed, Hopeless 0 1 0 0 0  PHQ - 2 Score 0 2 0 0 0  Altered sleeping 0 0 - - -  Tired, decreased energy 1 1 - - -  Change in appetite 0 0 - - -  Feeling bad or failure about yourself  0 0 - - -  Trouble concentrating 1 0 - - -  Moving slowly or fidgety/restless 0 0 - - -  Suicidal thoughts 0 0 - - -  PHQ-9 Score 2 3 - - -  Difficult doing work/chores Not difficult at all Not difficult at all - - -    Fall Risk: Fall Risk  02/08/2018 11/24/2017 08/16/2017 05/26/2017 03/01/2017  Falls in the past year? Yes Yes No No No  Comment missed step when walking down stairs - - - -  Number falls in past yr: 1 1 - - -  Injury with Fall? No No - - -  Risk for fall due to : History of fall(s);Impaired vision - - - -  Risk for fall due to: Comment vertigo; glaucoma; wears eyeglasses - - - -  Follow up Falls evaluation completed;Education provided;Falls prevention discussed - - - -     Functional Status Survey: Is the patient deaf or have difficulty hearing?: No Does the patient have difficulty seeing, even when wearing glasses/contacts?: No Does the patient have difficulty concentrating, remembering, or making decisions?: No Does  the patient have difficulty walking or climbing stairs?: Yes Does the patient have difficulty dressing or bathing?: No Does the patient have difficulty doing errands alone such as visiting a doctor's office or shopping?: No   Assessment & Plan  1. Benign hypertension   2. Dyslipidemia  Last labs better , recheck next visit   3. Hyperglycemia  Recheck hgbA1c with next labs.   4. Essential hypertension  bp is out of control today, but states usually at goal at home   5. Major depression, recurrent, chronic (HCC)  She is off medication at this time, and not interested on medication   6. Age-related osteoporosis without current pathological fracture  She is taking fosamax bur not very consistent, discussed other options and referral to endocrinologist.   7. Abnormal CT of brain  _MRI brain

## 2018-03-03 MED ORDER — OMEPRAZOLE 20 MG PO CPDR: 20.0000 mg | DELAYED_RELEASE_CAPSULE | Freq: Every day | ORAL | 0 refills | Status: DC

## 2018-03-16 DIAGNOSIS — H401132 Primary open-angle glaucoma, bilateral, moderate stage: Secondary | ICD-10-CM | POA: Diagnosis not present

## 2018-03-30 DIAGNOSIS — H401132 Primary open-angle glaucoma, bilateral, moderate stage: Secondary | ICD-10-CM | POA: Diagnosis not present

## 2018-04-13 DIAGNOSIS — H401132 Primary open-angle glaucoma, bilateral, moderate stage: Secondary | ICD-10-CM | POA: Diagnosis not present

## 2018-04-18 DIAGNOSIS — H524 Presbyopia: Secondary | ICD-10-CM | POA: Diagnosis not present

## 2018-04-25 ENCOUNTER — Other Ambulatory Visit: Payer: Self-pay | Admitting: Family Medicine

## 2018-04-25 DIAGNOSIS — K219 Gastro-esophageal reflux disease without esophagitis: Secondary | ICD-10-CM

## 2018-04-28 DIAGNOSIS — N812 Incomplete uterovaginal prolapse: Secondary | ICD-10-CM | POA: Diagnosis not present

## 2018-04-28 DIAGNOSIS — N8111 Cystocele, midline: Secondary | ICD-10-CM | POA: Diagnosis not present

## 2018-05-09 NOTE — Patient Instructions (Addendum)
Your procedure is scheduled on: Monday, July 15  Enter through the Main Entrance of [facility] at: 6 am  Pick up the phone at the desk and dial [phone].  Call this number if you have problems the morning of surgery: [phone].  Remember: Do NOT eat food or Do NOT drink clear liquids (including water) after midnight Sunday.  Take these medicines the morning of surgery with a SIP OF WATER: None .  Ok to use alphagan eye drops.  Stop herbal medications, vitamin supplements, Ibuprofen/NSAIDS 1 week prior to surgery.  Do NOT wear jewelry (body piercing), metal hair clips/bobby pins, make-up, or nail polish. Do NOT wear lotions, powders, or perfumes.  You may wear deoderant. Do NOT shave for 48 hours prior to surgery. Do NOT bring valuables to the hospital.  Leave suitcase in car.  After surgery it may be brought to your room.  For patients admitted to the hospital, checkout time is 11:00 AM the day of discharge. Home with Judd Lien cell [phone] or Sister Hoyle Sauer cell [phone].

## 2018-05-18 ENCOUNTER — Encounter (HOSPITAL_COMMUNITY)
Admission: RE | Admit: 2018-05-18 | Discharge: 2018-05-18 | Disposition: A | Discharge status: Home / Self Care | Payer: Medicare HMO | Source: Ambulatory Visit | Attending: Obstetrics and Gynecology | Admitting: Obstetrics and Gynecology

## 2018-05-18 ENCOUNTER — Encounter (HOSPITAL_COMMUNITY): Payer: Self-pay

## 2018-05-18 ENCOUNTER — Other Ambulatory Visit: Payer: Self-pay

## 2018-05-18 DIAGNOSIS — Z01810 Encounter for preprocedural cardiovascular examination: Secondary | ICD-10-CM | POA: Diagnosis not present

## 2018-05-18 DIAGNOSIS — Z01812 Encounter for preprocedural laboratory examination: Secondary | ICD-10-CM | POA: Diagnosis present

## 2018-05-18 HISTORY — DX: Unspecified osteoarthritis, unspecified site: M19.90

## 2018-05-18 HISTORY — DX: Unspecified glaucoma: H40.9

## 2018-05-18 HISTORY — DX: Other symptoms and signs involving the musculoskeletal system: R29.898

## 2018-05-18 HISTORY — DX: Anemia, unspecified: D64.9

## 2018-05-18 LAB — COMPREHENSIVE METABOLIC PANEL
ALT: 19 U/L (ref 0–44)
AST: 22 U/L (ref 15–41)
Albumin: 4.2 g/dL (ref 3.5–5.0)
Alkaline Phosphatase: 50 U/L (ref 38–126)
Anion gap: 8 (ref 5–15)
BUN: 21 mg/dL (ref 8–23)
CO2: 27 mmol/L (ref 22–32)
Calcium: 9.3 mg/dL (ref 8.9–10.3)
Chloride: 105 mmol/L (ref 98–111)
Creatinine, Ser: 0.77 mg/dL (ref 0.44–1.00)
GFR calc Af Amer: >60 mL/min (ref >60)
GFR calc non Af Amer: >60 mL/min (ref >60)
Glucose, Bld: 70 mg/dL (ref 70–99)
Potassium: 4.6 mmol/L (ref 3.5–5.1)
Sodium: 140 mmol/L (ref 135–145)
Total Bilirubin: 0.7 mg/dL (ref 0.3–1.2)
Total Protein: 7 g/dL (ref 6.5–8.1)

## 2018-05-18 LAB — CBC
HCT: 41.6 % (ref 36.0–46.0)
Hemoglobin: 13.5 g/dL (ref 12.0–15.0)
MCH: 29 pg (ref 26.0–34.0)
MCHC: 32.5 g/dL (ref 30.0–36.0)
MCV: 89.3 fL (ref 78.0–100.0)
Platelets: 217 10*3/uL (ref 150–400)
RBC: 4.66 MIL/uL (ref 3.87–5.11)
RDW: 13.7 % (ref 11.5–15.5)
WBC: 6.1 10*3/uL (ref 4.0–10.5)

## 2018-05-18 LAB — TYPE AND SCREEN
ABO/RH(D): A POS
Antibody Screen: NEGATIVE

## 2018-05-18 LAB — ABO/RH: ABO/RH(D): A POS

## 2018-05-18 NOTE — Pre-Procedure Instructions (Signed)
Patient noted to have bilateral eye redness and swelling.  Patient just started a new glaucoma eye drops (Rocklatan).  Advised patient to contact MD to let them know of this side effect from this medication.  Patient verbalized understanding.  Patient stated she would call MD this afternoon.

## 2018-05-29 NOTE — H&P (Addendum)
  [name]:  [name] presents today for preop evaluation for TAH, BSO, A&P repair.  She has had ongoing problems with uterine prolapse, cystocele and rectocele, previously tried pessary without good success because she had ulceration of the cervix and vagina.  She has been using vaginal estrogen cream and recently had a scare where her boyfriend saw it prolapse and it scared him.  She at this time wants surgical intervention and wants hysterectomy with repair of the vagina.   O:  On physical exam, she has uterine prolapse to the introitus with a third-degree cystocele, second-degree rectocele, no significant loss of the UV angle.  No adnexal masses palpated.  PAST MEDICAL HISTORY:  Significant for hypertension and also eye disease which includes glaucoma and she is on several drops for this, and she also has elevated cholesterol and osteoporosis.   MEDICATIONS:  Include Fosamax, losartan, several different eyedrops and pravastatin.   ALLERGIES:   Sulfa, and codeine makes her nauseated.   A/P:  Uterine prolapse with cystocele and rectocele.  Plan vaginal hysterectomy with bilateral salpingo-oophorectomy, anterior and posterior colporrhaphy, probable sacrospinous suspension.  Discussed the procedure at length, its risks, its benefits, its pros, its cons, the catheter wear afterwards, the potential for prolonged catheter wear, bladder injury or bowel injury.  Discussed the possibility we may not be able to remove the ovaries.  All of her questions were answered, and she gives informed consent.   Louretta Shorten, MD  05/30/18 0715 This patient has been seen and examined.   All of her questions were answered.  Labs and vital signs reviewed.  Informed consent has been obtained.  The History and Physical is current. DL

## 2018-05-29 NOTE — Anesthesia Preprocedure Evaluation (Addendum)
Anesthesia Evaluation  Patient identified by MRN, date of birth, ID band Patient awake    Reviewed: Allergy & Precautions, NPO status , Patient's Chart, lab work & pertinent test results  Airway Mallampati: I       Dental  (+) Teeth Intact, Partial Lower Permanent partial lower:   Pulmonary neg pulmonary ROS,    breath sounds clear to auscultation       Cardiovascular Exercise Tolerance: Good hypertension, Pt. on medications  Rhythm:Regular     Neuro/Psych Anxiety Depression    GI/Hepatic Neg liver ROS, GERD  Medicated,  Endo/Other  negative endocrine ROS  Renal/GU negative Renal ROS     Musculoskeletal   Abdominal   Peds negative pediatric ROS (+)  Hematology negative hematology ROS (+)   Anesthesia Other Findings   Reproductive/Obstetrics                            Anesthesia Physical  Anesthesia Plan  ASA: II  Anesthesia Plan: General   Post-op Pain Management:    Induction: Intravenous  PONV Risk Score and Plan: 2 and Treatment may vary due to age or medical condition, Ondansetron and Dexamethasone  Airway Management Planned: Oral ETT and LMA  Additional Equipment:   Intra-op Plan:   Post-operative Plan: Extubation in OR  Informed Consent: I have reviewed the patients History and Physical, chart, labs and discussed the procedure including the risks, benefits and alternatives for the proposed anesthesia with the patient or authorized representative who has indicated his/her understanding and acceptance.     Plan Discussed with: CRNA, Surgeon and Anesthesiologist  Anesthesia Plan Comments: ( )        Anesthesia Quick Evaluation

## 2018-05-30 ENCOUNTER — Other Ambulatory Visit: Payer: Self-pay

## 2018-05-30 ENCOUNTER — Ambulatory Visit (HOSPITAL_COMMUNITY): Payer: Medicare Other | Admitting: Anesthesiology

## 2018-05-30 ENCOUNTER — Encounter (HOSPITAL_COMMUNITY)
Admission: AD | Disposition: A | Discharge status: Home / Self Care | Payer: Self-pay | Source: Ambulatory Visit | Attending: Obstetrics and Gynecology

## 2018-05-30 ENCOUNTER — Inpatient Hospital Stay (HOSPITAL_COMMUNITY)
Admission: AD | Admit: 2018-05-30 | Discharge: 2018-05-31 | DRG: 743 | Disposition: A | Discharge status: Home / Self Care | Payer: Medicare Other | Source: Ambulatory Visit | Attending: Obstetrics and Gynecology | Admitting: Obstetrics and Gynecology

## 2018-05-30 ENCOUNTER — Encounter (HOSPITAL_COMMUNITY): Payer: Self-pay | Admitting: *Deleted

## 2018-05-30 DIAGNOSIS — Z79890 Hormone replacement therapy (postmenopausal): Secondary | ICD-10-CM

## 2018-05-30 DIAGNOSIS — M810 Age-related osteoporosis without current pathological fracture: Secondary | ICD-10-CM | POA: Diagnosis not present

## 2018-05-30 DIAGNOSIS — N814 Uterovaginal prolapse, unspecified: Principal | ICD-10-CM | POA: Diagnosis present

## 2018-05-30 DIAGNOSIS — E7800 Pure hypercholesterolemia, unspecified: Secondary | ICD-10-CM | POA: Diagnosis not present

## 2018-05-30 DIAGNOSIS — H409 Unspecified glaucoma: Secondary | ICD-10-CM | POA: Diagnosis present

## 2018-05-30 DIAGNOSIS — Z7983 Long term (current) use of bisphosphonates: Secondary | ICD-10-CM | POA: Diagnosis not present

## 2018-05-30 DIAGNOSIS — Z79899 Other long term (current) drug therapy: Secondary | ICD-10-CM

## 2018-05-30 DIAGNOSIS — K219 Gastro-esophageal reflux disease without esophagitis: Secondary | ICD-10-CM | POA: Diagnosis present

## 2018-05-30 DIAGNOSIS — I10 Essential (primary) hypertension: Secondary | ICD-10-CM | POA: Diagnosis not present

## 2018-05-30 DIAGNOSIS — Z90710 Acquired absence of both cervix and uterus: Secondary | ICD-10-CM | POA: Diagnosis present

## 2018-05-30 HISTORY — DX: Presence of dental prosthetic device (complete) (partial): Z97.2

## 2018-05-30 HISTORY — DX: Other specified disorders of eye and adnexa: H57.89

## 2018-05-30 HISTORY — PX: VAGINAL HYSTERECTOMY: SHX2639

## 2018-05-30 HISTORY — PX: SALPINGOOPHORECTOMY: SHX82

## 2018-05-30 HISTORY — PX: ANTERIOR AND POSTERIOR REPAIR WITH SACROSPINOUS FIXATION: SHX6536

## 2018-05-30 SURGERY — HYSTERECTOMY, VAGINAL
Anesthesia: General | Laterality: Right

## 2018-05-30 MED ORDER — HYDROMORPHONE HCL 1 MG/ML IJ SOLN
INTRAMUSCULAR | Status: AC
Start: 2018-05-30 — End: ?
  Filled 2018-05-30: qty 1

## 2018-05-30 MED ORDER — ESTRADIOL 0.1 MG/GM VA CREA
TOPICAL_CREAM | VAGINAL | Status: DC | PRN
  Administered 2018-05-30: 1 via VAGINAL

## 2018-05-30 MED ORDER — HYDROMORPHONE HCL 1 MG/ML IJ SOLN
INTRAMUSCULAR | Status: DC | PRN
  Administered 2018-05-30 (×4): .25 mg via INTRAVENOUS

## 2018-05-30 MED ORDER — NETARSUDIL-LATANOPROST 0.02-0.005 % OP SOLN
1.0000 [drp] | Freq: Every day | OPHTHALMIC | Status: DC
  Administered 2018-05-30: 1 [drp] via OPHTHALMIC
  Filled 2018-05-30 (×2): qty 2.5

## 2018-05-30 MED ORDER — ALUM & MAG HYDROXIDE-SIMETH 200-200-20 MG/5ML PO SUSP: 30.0000 mL | ORAL | Status: DC | PRN

## 2018-05-30 MED ORDER — PROPOFOL 10 MG/ML IV BOLUS
INTRAVENOUS | Status: AC
Start: 2018-05-30 — End: ?
  Filled 2018-05-30: qty 20

## 2018-05-30 MED ORDER — LIDOCAINE HCL (CARDIAC) PF 100 MG/5ML IV SOSY
PREFILLED_SYRINGE | INTRAVENOUS | Status: AC
  Filled 2018-05-30: qty 5

## 2018-05-30 MED ORDER — DEXAMETHASONE SODIUM PHOSPHATE 4 MG/ML IJ SOLN
INTRAMUSCULAR | Status: AC
  Filled 2018-05-30: qty 1

## 2018-05-30 MED ORDER — NALOXONE HCL 0.4 MG/ML IJ SOLN: 0.4000 mg | INTRAMUSCULAR | Status: DC | PRN

## 2018-05-30 MED ORDER — FENTANYL CITRATE (PF) 100 MCG/2ML IJ SOLN
25.0000 ug | INTRAMUSCULAR | Status: DC | PRN
  Administered 2018-05-30 (×3): 50 ug via INTRAVENOUS

## 2018-05-30 MED ORDER — ACETAMINOPHEN 10 MG/ML IV SOLN
INTRAVENOUS | Status: DC | PRN
  Administered 2018-05-30: 1000 mg via INTRAVENOUS

## 2018-05-30 MED ORDER — FENTANYL CITRATE (PF) 100 MCG/2ML IJ SOLN
INTRAMUSCULAR | Status: DC | PRN
  Administered 2018-05-30 (×5): 50 ug via INTRAVENOUS

## 2018-05-30 MED ORDER — CEFOTETAN DISODIUM 2 G IJ SOLR
INTRAMUSCULAR | Status: AC
  Filled 2018-05-30: qty 2

## 2018-05-30 MED ORDER — MEPERIDINE HCL 25 MG/ML IJ SOLN: 6.2500 mg | INTRAMUSCULAR | Status: DC | PRN

## 2018-05-30 MED ORDER — MENTHOL 3 MG MT LOZG: 1.0000 | LOZENGE | OROMUCOSAL | Status: DC | PRN

## 2018-05-30 MED ORDER — ACETAMINOPHEN 10 MG/ML IV SOLN
INTRAVENOUS | Status: AC
  Filled 2018-05-30: qty 100

## 2018-05-30 MED ORDER — LACTATED RINGERS IV SOLN
INTRAVENOUS | Status: DC
  Administered 2018-05-30 (×2): via INTRAVENOUS

## 2018-05-30 MED ORDER — SODIUM CHLORIDE 0.9% FLUSH: 9.0000 mL | INTRAVENOUS | Status: DC | PRN

## 2018-05-30 MED ORDER — FENTANYL CITRATE (PF) 100 MCG/2ML IJ SOLN
INTRAMUSCULAR | Status: AC
  Filled 2018-05-30: qty 2

## 2018-05-30 MED ORDER — HYDROMORPHONE HCL 1 MG/ML IJ SOLN
0.2000 mg | INTRAMUSCULAR | Status: DC | PRN
  Administered 2018-05-31: 0.6 mg via INTRAVENOUS
  Filled 2018-05-30: qty 1

## 2018-05-30 MED ORDER — 0.9 % SODIUM CHLORIDE (POUR BTL) OPTIME
TOPICAL | Status: DC | PRN
  Administered 2018-05-30: 1000 mL

## 2018-05-30 MED ORDER — ONDANSETRON HCL 4 MG/2ML IJ SOLN
4.0000 mg | Freq: Four times a day (QID) | INTRAMUSCULAR | Status: DC | PRN
  Administered 2018-05-30: 4 mg via INTRAVENOUS
  Filled 2018-05-30: qty 2

## 2018-05-30 MED ORDER — ONDANSETRON HCL 4 MG/2ML IJ SOLN
INTRAMUSCULAR | Status: DC | PRN
  Administered 2018-05-30: 4 mg via INTRAVENOUS

## 2018-05-30 MED ORDER — DIPHENHYDRAMINE HCL 50 MG/ML IJ SOLN: 12.5000 mg | Freq: Four times a day (QID) | INTRAMUSCULAR | Status: DC | PRN

## 2018-05-30 MED ORDER — SIMETHICONE 80 MG PO CHEW: 80.0000 mg | CHEWABLE_TABLET | Freq: Four times a day (QID) | ORAL | Status: DC | PRN

## 2018-05-30 MED ORDER — SODIUM CHLORIDE 0.9 % IV SOLN
2.0000 g | INTRAVENOUS | Status: AC
  Administered 2018-05-30: 2 g via INTRAVENOUS

## 2018-05-30 MED ORDER — SUGAMMADEX SODIUM 200 MG/2ML IV SOLN
INTRAVENOUS | Status: DC | PRN
  Administered 2018-05-30: 150 mg via INTRAVENOUS

## 2018-05-30 MED ORDER — BUPIVACAINE-EPINEPHRINE (PF) 0.25% -1:200000 IJ SOLN
INTRAMUSCULAR | Status: AC
  Filled 2018-05-30: qty 30

## 2018-05-30 MED ORDER — ONDANSETRON HCL 4 MG/2ML IJ SOLN
INTRAMUSCULAR | Status: AC
  Filled 2018-05-30: qty 2

## 2018-05-30 MED ORDER — ROCURONIUM BROMIDE 100 MG/10ML IV SOLN
INTRAVENOUS | Status: AC
  Filled 2018-05-30: qty 1

## 2018-05-30 MED ORDER — FENTANYL CITRATE (PF) 250 MCG/5ML IJ SOLN
INTRAMUSCULAR | Status: AC
  Filled 2018-05-30: qty 5

## 2018-05-30 MED ORDER — PROMETHAZINE HCL 25 MG/ML IJ SOLN
12.5000 mg | Freq: Four times a day (QID) | INTRAMUSCULAR | Status: DC | PRN
  Administered 2018-05-30: 12.5 mg via INTRAVENOUS
  Filled 2018-05-30: qty 1

## 2018-05-30 MED ORDER — KETOROLAC TROMETHAMINE 30 MG/ML IJ SOLN
30.0000 mg | Freq: Once | INTRAMUSCULAR | Status: AC
  Administered 2018-05-30: 30 mg via INTRAVENOUS
  Filled 2018-05-30: qty 1

## 2018-05-30 MED ORDER — DEXAMETHASONE SODIUM PHOSPHATE 4 MG/ML IJ SOLN
INTRAMUSCULAR | Status: DC | PRN
  Administered 2018-05-30: 4 mg via INTRAVENOUS

## 2018-05-30 MED ORDER — LIDOCAINE HCL (CARDIAC) PF 100 MG/5ML IV SOSY
PREFILLED_SYRINGE | INTRAVENOUS | Status: DC | PRN
  Administered 2018-05-30: 70 mg via INTRAVENOUS

## 2018-05-30 MED ORDER — ONDANSETRON HCL 4 MG/2ML IJ SOLN
INTRAMUSCULAR | Status: AC
  Administered 2018-05-30: 4 mg via INTRAVENOUS
  Filled 2018-05-30: qty 2

## 2018-05-30 MED ORDER — ROCURONIUM BROMIDE 100 MG/10ML IV SOLN
INTRAVENOUS | Status: DC | PRN
  Administered 2018-05-30: 30 mg via INTRAVENOUS
  Administered 2018-05-30: 10 mg via INTRAVENOUS

## 2018-05-30 MED ORDER — PROPOFOL 10 MG/ML IV BOLUS
INTRAVENOUS | Status: DC | PRN
  Administered 2018-05-30: 100 mg via INTRAVENOUS

## 2018-05-30 MED ORDER — FENTANYL 40 MCG/ML IV SOLN
INTRAVENOUS | Status: DC
  Administered 2018-05-30: 230 ug via INTRAVENOUS
  Administered 2018-05-30: 1000 ug via INTRAVENOUS
  Administered 2018-05-30: 40 ug via INTRAVENOUS
  Filled 2018-05-30: qty 25

## 2018-05-30 MED ORDER — FENTANYL 40 MCG/ML IV SOLN
INTRAVENOUS | Status: DC
  Administered 2018-05-30: 1000 ug via INTRAVENOUS
  Administered 2018-05-30: 60 ug via INTRAVENOUS
  Administered 2018-05-31: 20 ug via INTRAVENOUS
  Filled 2018-05-30: qty 25

## 2018-05-30 MED ORDER — DEXTROSE-NACL 5-0.45 % IV SOLN
INTRAVENOUS | Status: DC
  Administered 2018-05-30 – 2018-05-31 (×2): via INTRAVENOUS

## 2018-05-30 MED ORDER — ONDANSETRON HCL 4 MG/2ML IJ SOLN: 4.0000 mg | Freq: Four times a day (QID) | INTRAMUSCULAR | Status: DC | PRN

## 2018-05-30 MED ORDER — LOSARTAN POTASSIUM 50 MG PO TABS
50.0000 mg | ORAL_TABLET | Freq: Every day | ORAL | Status: DC
  Filled 2018-05-30 (×3): qty 1

## 2018-05-30 MED ORDER — DIPHENHYDRAMINE HCL 12.5 MG/5ML PO ELIX: 12.5000 mg | ORAL_SOLUTION | Freq: Four times a day (QID) | ORAL | Status: DC | PRN

## 2018-05-30 MED ORDER — ESTRADIOL 0.1 MG/GM VA CREA
TOPICAL_CREAM | VAGINAL | Status: AC
  Filled 2018-05-30: qty 42.5

## 2018-05-30 MED ORDER — FENTANYL CITRATE (PF) 100 MCG/2ML IJ SOLN
INTRAMUSCULAR | Status: AC
  Administered 2018-05-30: 50 ug via INTRAVENOUS
  Filled 2018-05-30: qty 2

## 2018-05-30 MED ORDER — ONDANSETRON HCL 4 MG/2ML IJ SOLN
4.0000 mg | Freq: Once | INTRAMUSCULAR | Status: AC
  Administered 2018-05-30: 4 mg via INTRAVENOUS

## 2018-05-30 MED ORDER — ZOLPIDEM TARTRATE 5 MG PO TABS: 5.0000 mg | ORAL_TABLET | Freq: Every evening | ORAL | Status: DC | PRN

## 2018-05-30 MED ORDER — IBUPROFEN 600 MG PO TABS
600.0000 mg | ORAL_TABLET | Freq: Four times a day (QID) | ORAL | Status: DC | PRN
  Administered 2018-05-31 (×2): 600 mg via ORAL
  Filled 2018-05-30 (×2): qty 1

## 2018-05-30 MED ORDER — DEXAMETHASONE SODIUM PHOSPHATE 10 MG/ML IJ SOLN: INTRAMUSCULAR | Status: DC | PRN

## 2018-05-30 SURGICAL SUPPLY — 29 items
CANISTER SUCT 3000ML PPV (MISCELLANEOUS) ×3 IMPLANT
CATH ROBINSON RED A/P 16FR (CATHETERS) ×3 IMPLANT
CONT PATH 16OZ SNAP LID 3702 (MISCELLANEOUS) ×3 IMPLANT
DECANTER SPIKE VIAL GLASS SM (MISCELLANEOUS) IMPLANT
DEVICE CAPIO SLIM SINGLE (INSTRUMENTS) IMPLANT
GAUZE PACKING 1 X5 YD ST (GAUZE/BANDAGES/DRESSINGS) ×3 IMPLANT
GLOVE BIO SURGEON STRL SZ8 (GLOVE) ×3 IMPLANT
GLOVE BIOGEL PI IND STRL 6.5 (GLOVE) ×2 IMPLANT
GLOVE BIOGEL PI IND STRL 7.0 (GLOVE) ×2 IMPLANT
GLOVE BIOGEL PI INDICATOR 6.5 (GLOVE) ×1
GLOVE BIOGEL PI INDICATOR 7.0 (GLOVE) ×1
GLOVE SURG ORTHO 8.0 STRL STRW (GLOVE) ×6 IMPLANT
GOWN STRL REUS W/TWL LRG LVL3 (GOWN DISPOSABLE) ×12 IMPLANT
LIGASURE IMPACT 36 18CM CVD LR (INSTRUMENTS) ×3 IMPLANT
NEEDLE HYPO 22GX1.5 SAFETY (NEEDLE) IMPLANT
NEEDLE MAYO 6 CRC TAPER PT (NEEDLE) IMPLANT
NEEDLE SPNL 22GX3.5 QUINCKE BK (NEEDLE) IMPLANT
NS IRRIG 1000ML POUR BTL (IV SOLUTION) ×3 IMPLANT
PACK VAGINAL WOMENS (CUSTOM PROCEDURE TRAY) ×3 IMPLANT
PAD OB MATERNITY 4.3X12.25 (PERSONAL CARE ITEMS) ×3 IMPLANT
SUT CAPIO ETHIBPND (SUTURE) IMPLANT
SUT MNCRL 0 MO-4 VIOLET 18 CR (SUTURE) ×6 IMPLANT
SUT MNCRL 0 VIOLET 6X18 (SUTURE) ×2 IMPLANT
SUT MON AB 2-0 CT1 27 (SUTURE) ×12 IMPLANT
SUT MONOCRYL 0 6X18 (SUTURE) ×1
SUT MONOCRYL 0 MO 4 18  CR/8 (SUTURE) ×3
SUT PDS AB 0 CT1 27 (SUTURE) IMPLANT
TOWEL OR 17X24 6PK STRL BLUE (TOWEL DISPOSABLE) ×6 IMPLANT
TRAY FOLEY W/BAG SLVR 14FR (SET/KITS/TRAYS/PACK) ×3 IMPLANT

## 2018-05-30 NOTE — Brief Op Note (Signed)
05/30/2018  8:54 AM  PATIENT:  [name]  77 y.o. female  PRE-OPERATIVE DIAGNOSIS:  uterine prolapse, cystocele, rectocele  POST-OPERATIVE DIAGNOSIS:  uterine prolapse, cystocele, rectocele  PROCEDURE:  Procedure(s): HYSTERECTOMY VAGINAL (N/A) ANTERIOR AND POSTERIOR REPAIR (N/A) SALPINGO OOPHORECTOMY (Right)  SURGEON:  Surgeon(s) and Role:    Louretta Shorten, MD - Primary    * Maisie Fus, MD - Assisting  PHYSICIAN ASSISTANT:   ASSISTANTS: neal   ANESTHESIA:   general  EBL:  100 mL   BLOOD ADMINISTERED:none  DRAINS: Urinary Catheter (Foley)   LOCAL MEDICATIONS USED:  NONE  SPECIMEN:  Source of Specimen:  uterus rigth tube and ovary  DISPOSITION OF SPECIMEN:  PATHOLOGY  COUNTS:  YES  TOURNIQUET:  * No tourniquets in log *  DICTATION: .Other Dictation: Dictation Number 1  PLAN OF CARE: Admit to inpatient   PATIENT DISPOSITION:  PACU - hemodynamically stable.   Delay start of Pharmacological VTE agent (>24hrs) due to surgical blood loss or risk of bleeding: not applicable

## 2018-05-30 NOTE — Transfer of Care (Signed)
Immediate Anesthesia Transfer of Care Note  Patient: [name]  Procedure(s) Performed: HYSTERECTOMY VAGINAL (N/A ) ANTERIOR AND POSTERIOR REPAIR (N/A ) SALPINGO OOPHORECTOMY (Right )  Patient Location: PACU  Anesthesia Type:General  Level of Consciousness: awake, alert  and oriented  Airway & Oxygen Therapy: Patient Spontanous Breathing and Patient connected to nasal cannula oxygen  Post-op Assessment: Report given to RN, Post -op Vital signs reviewed and stable and Patient moving all extremities X 4  Post vital signs: Reviewed and stable  Last Vitals:  Vitals Value Taken Time  BP    Temp    Pulse 73 05/30/2018  9:15 AM  Resp 15 05/30/2018  9:15 AM  SpO2 100 % 05/30/2018  9:15 AM  Vitals shown include unvalidated device data.  Last Pain:  Vitals:   05/30/18 0614  TempSrc: Oral  PainSc: 0-No pain      Patients Stated Pain Goal: 4 (73/57/89 7847)  Complications: No apparent anesthesia complications

## 2018-05-30 NOTE — Addendum Note (Signed)
Addendum  created 05/30/18 1308 by Asher Muir, CRNA   Sign clinical note

## 2018-05-30 NOTE — Anesthesia Procedure Notes (Signed)
Procedure Name: Intubation Date/Time: 05/30/2018 7:36 AM Performed by: Elenore Paddy, CRNA Pre-anesthesia Checklist: Patient identified, Emergency Drugs available, Suction available, Patient being monitored and Timeout performed Patient Re-evaluated:Patient Re-evaluated prior to induction Oxygen Delivery Method: Circle system utilized Preoxygenation: Pre-oxygenation with 100% oxygen Induction Type: IV induction Ventilation: Mask ventilation without difficulty Laryngoscope Size: Mac and 3 Grade View: Grade II Tube type: Oral Laser Tube: Cuffed inflated with minimal occlusive pressure - saline Tube size: 7.0 mm Number of attempts: 1 Airway Equipment and Method: Stylet Placement Confirmation: ETT inserted through vocal cords under direct vision,  positive ETCO2 and breath sounds checked- equal and bilateral Secured at: 21 cm Tube secured with: Tape Dental Injury: Teeth and Oropharynx as per pre-operative assessment

## 2018-05-30 NOTE — Op Note (Signed)
[name], [name] MEDICAL RECORD DQ:[id number] ACCOUNT [id number] DATE OF BIRTH:1941/01/05 FACILITY: [facility] LOCATION: [facility] PHYSICIAN:Ryli Standlee Dominic Pea, MD  OPERATIVE REPORT  DATE OF PROCEDURE:  05/30/2018  PREOPERATIVE DIAGNOSIS:  Uterine prolapse with symptomatic cystocele and rectocele.  POSTOPERATIVE DIAGNOSIS:  Uterine prolapse with symptomatic cystocele and rectocele.  PROCEDURE:  Vaginal hysterectomy with right salpingo-oophorectomy, anterior and posterior colporrhaphy and perineorrhaphy.  SURGEON:  Louretta Shorten, MD.  ASSISTANT:  Evette Cristal, MD  ANESTHESIA:  General endotracheal.  INDICATIONS:  The patient is a 77 year old with uterine prolapse, cystocele and rectocele.  She has tried pessaries without good success.  She has been using vaginal estrogen and had a recent scare with a boyfriend where he saw the prolapsed uterus and  it scared him.  She desires definitive surgical intervention at this time.  She denies any urinary incontinence symptoms and wants to proceed with hysterectomy with anterior and posterior colporrhaphy, possible sacrospinous suspension.  We discussed the  risks and benefits.  We discussed the procedure at length.  We discussed the success rate.  We discussed the risk of complications including bladder dysfunction, injury to the bowel, bladder, rectum, possibility of prolonged catheter wearing, possibility  of increasing urinary incontinence.  We have discussed the risks associated with blood transfusion.  We discussed with the recovery at length and all of her questions were answered.  She has given written and informed consent.  She also understands that  we may or may not be able to remove the ovaries and tubes due to the approach of the surgery and she agrees with trying to get the ovaries, but agrees that if we were unable to remove them safely, to leave them alone.  FINDINGS:  At the time of surgery, uterine prolapse with third-degree cystocele,  third-degree rectocele and normal appearing ovaries bilaterally.  DESCRIPTION OF PROCEDURE:  After adequate analgesia, the patient was placed in the dorsal lithotomy position.  She was sterilely prepped and draped.  Bladder was sterilely drained.  A weighted speculum placed in the vagina.  The anterior lip of the  cervix was grasped with a Ardis Hughs tenaculum.  The cervix was then circumscribed with the Bovie cautery.  A posterior colpotomy was then performed.  The LigaSure instrument was used to ligate across the uterosacral ligaments bilaterally, the cardinal  ligaments bilaterally and bladder pillars bilaterally.  The bladder was dissected off the anterior surface of the cervix.  Anterior peritoneum was entered sharply and a Deaver retractor was placed below the bladder.  The uterine artery was then ligated  bilaterally up across the round ligament to the top of the broad ligament.  The left tube and ovary were visualized, but well supported and unable to be brought into the surgical field, so a LigaSure was clamped across the uteroovarian ligament and  fallopian tube and dissected.  This was done similarly on the right side; however, the right tube and ovary were able to be brought into the surgical field and the LigaSure was then placed across the infundibulopelvic ligament and the right tube and  ovary were easily removed.  The left ovary did appear to be normal with what we could see, but eluded removal.  Uterosacral ligaments were identified bilaterally, suture ligated with a figure-of-eight 0 Monocryl suture.  The posterior peritoneum was  closed in a pursestring fashion with 0 Monocryl suture.  The uterosacral ligaments were then plicated in the midline with good support noted and good hemostasis.  The posterior vaginal mucosa was then closed  with 0 Monocryl interrupted sutures of 5-0  Monocryl.  The anterior vaginal mucosa was then dissected in the midline and reflected laterally until  approximately 2 cm away from the urethral meatus.  The pubovesicular cervical fascia was then dissected off the anterior vaginal mucosa and we plicated in the  midline using figure-of-eights of 0 Monocryl suture with good approximation and good support noted anteriorly.  The excess vaginal mucosa was then trimmed and the anterior vaginal mucosa was then closed with a 2-0 locking Monocryl suture with good  support anteriorly noted and no obvious cystocele remaining.  The hymenal remnants were grasped with Allis clamps.  A triangular flap made across the perineal body with a scalpel and the posterior vaginal mucosa was then undermined with the Metzenbaum scissors and the incision taken to near the apex of the vagina.   It was reflected laterally reflecting the perirectal fascia off the posterior vaginal mucosa.  At this point, it was noted there was excellent support of the apex of the vagina and it was felt that due to the excellent support at this point, a  sacrospinous fixation was not necessary.  The perirectal fascia was then plicated in the midline with excellent support noted.  The rectovaginal mucosa was then closed in a running locking suture of 2-0 Monocryl suture with good approximation and good  support noted.  The peritoneum was then closed with 0 Monocryl pop suture, plicating the superficial transverse perineal muscles and bulbocavernosus muscles along the perineal body.  The skin was then closed in a subcuticular fashion using the 2-0  Monocryl suture with excellent support noted.  The peritoneum posteriorly and anteriorly had excellent support of the vaginal cuff noted.  Foley catheter was placed with return of clear yellow urine.  The vagina was then packed with vaginal packing with  estrogen.  The patient was then transferred to recovery room in stable condition.  Sponge and needle count was normal x3.  Estimated blood loss 100 mL.  She received 2 grams of cefotetan  preoperatively.  TN/NUANCE  D:05/30/2018 T:05/30/2018 JOB:[id number]

## 2018-05-30 NOTE — Anesthesia Postprocedure Evaluation (Signed)
Anesthesia Post Note  Patient: Edmund Rick Penna  Procedure(s) Performed: HYSTERECTOMY VAGINAL (N/A ) ANTERIOR AND POSTERIOR REPAIR (N/A ) SALPINGO OOPHORECTOMY (Right )     Patient location during evaluation: PACU Anesthesia Type: General Level of consciousness: awake and alert Pain management: pain level controlled Vital Signs Assessment: post-procedure vital signs reviewed and stable Respiratory status: spontaneous breathing, nonlabored ventilation, respiratory function stable and patient connected to nasal cannula oxygen Cardiovascular status: blood pressure returned to baseline and stable Postop Assessment: no apparent nausea or vomiting Anesthetic complications: no    Last Vitals:  Vitals:   05/30/18 0930 05/30/18 0945  BP: (!) 173/86 (!) 171/72  Pulse: 78 73  Resp: 16 16  Temp:    SpO2: 100% 97%    Last Pain:  Vitals:   05/30/18 1000  TempSrc:   PainSc: 6    Pain Goal: Patients Stated Pain Goal: 4 (05/30/18 1000)               Elmer Boutelle

## 2018-05-30 NOTE — Anesthesia Postprocedure Evaluation (Signed)
Anesthesia Post Note  Patient: [name]  Procedure(s) Performed: HYSTERECTOMY VAGINAL (N/A ) ANTERIOR AND POSTERIOR REPAIR (N/A ) SALPINGO OOPHORECTOMY (Right )     Patient location during evaluation: [facility] Anesthesia Type: General Level of consciousness: awake Pain management: satisfactory to patient Vital Signs Assessment: post-procedure vital signs reviewed and stable Respiratory status: spontaneous breathing Cardiovascular status: stable Anesthetic complications: no    Last Vitals:  Vitals:   05/30/18 1125 05/30/18 1225  BP: (!) 161/75 (!) 168/68  Pulse: 74 67  Resp: 12 10  Temp: 36.8 C (!) 36.3 C  SpO2: 97% 99%    Last Pain:  Vitals:   05/30/18 1225  TempSrc: Oral  PainSc:    Pain Goal: Patients Stated Pain Goal: 4 (05/30/18 1009)               Casimer Lanius

## 2018-05-30 NOTE — Progress Notes (Signed)
Update given to Dr. Corinna Capra regarding pain and nausea.  RN to initiate toradol 30mg  once, phenergan 12.5 mg, and full dose Fentanyl PCA.

## 2018-05-31 ENCOUNTER — Encounter (HOSPITAL_COMMUNITY): Payer: Self-pay | Admitting: Obstetrics and Gynecology

## 2018-05-31 DIAGNOSIS — N814 Uterovaginal prolapse, unspecified: Secondary | ICD-10-CM | POA: Diagnosis not present

## 2018-05-31 LAB — CBC
HCT: 34.8 % — ABNORMAL LOW (ref 36.0–46.0)
Hemoglobin: 11.7 g/dL — ABNORMAL LOW (ref 12.0–15.0)
MCH: 29.3 pg (ref 26.0–34.0)
MCHC: 33.6 g/dL (ref 30.0–36.0)
MCV: 87 fL (ref 78.0–100.0)
Platelets: 193 10*3/uL (ref 150–400)
RBC: 4 MIL/uL (ref 3.87–5.11)
RDW: 13.4 % (ref 11.5–15.5)
WBC: 10.7 10*3/uL — ABNORMAL HIGH (ref 4.0–10.5)

## 2018-05-31 MED ORDER — IBUPROFEN 600 MG PO TABS: 600.0000 mg | ORAL_TABLET | Freq: Four times a day (QID) | ORAL | 0 refills | Status: DC | PRN

## 2018-05-31 NOTE — Discharge Summary (Signed)
Physician Discharge Summary  [name] MRN: [id number] DOB/AGE: [birth year] 77 y.o.  Admit date: 05/30/2018 Discharge date: 05/31/2018  Admission Diagnoses:Uterine prolapse, cystocoele and rectocoele  Discharge Diagnoses: same Active Problems:   S/P vaginal hysterectomy   Discharged Condition: good  Hospital Course: Pt underwent TVH, RSO, A&P repari which was uncomplicated with EBL [id number].  Her post op care was unremarkable with quick return of bowel and bladder function.  POD 1 voiding without difficulty and PVR less than 100cc.  Pain was well managed on ibuprofen and patient desired d/c home  Consults: None  Significant Diagnostic Studies: labs: hgb 11.7  Treatments: surgery: as above  Discharge Exam: Blood pressure (!) 125/57, pulse 70, temperature 98.1 F (36.7 C), temperature source Oral, resp. rate 16, height 5\' 2"  (1.575 m), weight 132 lb 4 oz (60 kg), SpO2 99 %. General appearance: alert, cooperative, appears stated age and no distress GI: soft, non-tender; bowel sounds normal; no masses,  no organomegaly minimal vag bleeding  Disposition: Discharge disposition: 01-Home or Self Care       Discharge Instructions    Call MD for:  difficulty breathing, headache or visual disturbances   Complete by:  As directed    Call MD for:  persistant nausea and vomiting   Complete by:  As directed    Call MD for:  redness, tenderness, or signs of infection (pain, swelling, redness, odor or green/yellow discharge around incision site)   Complete by:  As directed    Call MD for:  severe uncontrolled pain   Complete by:  As directed    Call MD for:  temperature >100.4   Complete by:  As directed    Diet general   Complete by:  As directed    Driving Restrictions   Complete by:  As directed    No driving for 2 weeks   Increase activity slowly   Complete by:  As directed    Lifting restrictions   Complete by:  As directed    No lifting anything greater  than 10 pounds (if you have to ask, don't lift it)   Sexual Activity Restrictions   Complete by:  As directed    Nothing in the vagina for 6 weeks     Allergies as of 05/31/2018      Reactions   Codeine    headaches   Combigan [brimonidine Tartrate-timolol]    drowsy   Sulfa Antibiotics Nausea And Vomiting      Medication List    TAKE these medications   alendronate 70 MG tablet Commonly known as:  FOSAMAX Take 1 tablet (70 mg total) by mouth once a week. Take with a full glass of water on an empty stomach.   ALPHAGAN P 0.1 % Soln Generic drug:  brimonidine Place 1 drop into both eyes 3 (three) times daily.   Flax Seed Oil 1000 MG Caps Take 1,000 mg by mouth daily.   fluticasone 50 MCG/ACT nasal spray Commonly known as:  FLONASE Place 2 sprays into both nostrils daily.   ibuprofen 200 MG tablet Commonly known as:  ADVIL,MOTRIN Take 600 mg by mouth 2 (two) times daily as needed for moderate pain. What changed:  Another medication with the same name was added. Make sure you understand how and when to take each.   ibuprofen 600 MG tablet Commonly known as:  ADVIL,MOTRIN Take 1 tablet (600 mg total) by mouth every 6 (six) hours as needed (mild pain). What changed:  You  were already taking a medication with the same name, and this prescription was added. Make sure you understand how and when to take each.   losartan 50 MG tablet Commonly known as:  COZAAR Take 1 tablet (50 mg total) by mouth daily. What changed:  when to take this   mometasone 0.1 % ointment Commonly known as:  ELOCON Apply topically daily.   omeprazole 20 MG capsule Commonly known as:  PRILOSEC Take 1 capsule (20 mg total) by mouth daily. What changed:    when to take this  reasons to take this   OVER THE COUNTER MEDICATION Take 1 tablet by mouth daily. Steel Libido - otc supplement   pravastatin 20 MG tablet Commonly known as:  PRAVACHOL Take 1 tablet (20 mg total) by mouth daily. What  changed:  when to take this   PREMARIN vaginal cream Generic drug:  conjugated estrogens Place 1 application vaginally 2 (two) times a week.   ROCKLATAN 0.02-0.005 % Soln Generic drug:  Netarsudil-Latanoprost Place 1 drop into both eyes at bedtime.   SYSTANE NIGHTTIME Oint Place 1 application into both eyes at bedtime as needed (dry eyes).   SYSTANE OP Place 1 drop into both eyes as needed (dry eyes).   VITAMIN E PO Take 1 capsule by mouth daily.        SignedLuz Lex 05/31/2018, 5:38 PM

## 2018-05-31 NOTE — Progress Notes (Signed)
1 Day Post-Op Procedure(s) (LRB): HYSTERECTOMY VAGINAL (N/A) ANTERIOR AND POSTERIOR REPAIR (N/A) SALPINGO OOPHORECTOMY (Right)  Subjective: Patient reports tolerating PO and no problems voiding.  C/o lower back pain  Objective: I have reviewed patient's vital signs.  General: alert, cooperative, appears stated age and no distress GI: soft, non-tender; bowel sounds normal; no masses,  no organomegaly Vaginal Bleeding: none packing removed by RN without problems  Assessment: s/p Procedure(s): HYSTERECTOMY VAGINAL (N/A) ANTERIOR AND POSTERIOR REPAIR (N/A) SALPINGO OOPHORECTOMY (Right): stable, progressing well and will check PVR after next void  Plan: Advance diet Advance to PO medication Discontinue IV fluids  LOS: 1 day    [name] C 05/31/2018, 9:15 AM

## 2018-05-31 NOTE — Progress Notes (Signed)
Discharge teaching complete with pt. Pt understood all information and did not have any questions. Pt discharged home to family. 

## 2018-06-06 ENCOUNTER — Ambulatory Visit: Payer: Self-pay | Admitting: Family Medicine

## 2018-06-29 ENCOUNTER — Encounter: Payer: Self-pay | Admitting: Family Medicine

## 2018-06-29 ENCOUNTER — Ambulatory Visit (INDEPENDENT_AMBULATORY_CARE_PROVIDER_SITE_OTHER): Payer: Medicare HMO | Admitting: Family Medicine

## 2018-06-29 VITALS — BP 132/72 | HR 98 | Temp 98.3°F | Resp 16 | Ht 63.0 in | Wt 130.7 lb

## 2018-06-29 DIAGNOSIS — E785 Hyperlipidemia, unspecified: Secondary | ICD-10-CM

## 2018-06-29 DIAGNOSIS — R739 Hyperglycemia, unspecified: Secondary | ICD-10-CM

## 2018-06-29 DIAGNOSIS — F339 Major depressive disorder, recurrent, unspecified: Secondary | ICD-10-CM

## 2018-06-29 DIAGNOSIS — M1711 Unilateral primary osteoarthritis, right knee: Secondary | ICD-10-CM

## 2018-06-29 DIAGNOSIS — I10 Essential (primary) hypertension: Secondary | ICD-10-CM

## 2018-06-29 DIAGNOSIS — K219 Gastro-esophageal reflux disease without esophagitis: Secondary | ICD-10-CM

## 2018-06-29 DIAGNOSIS — M810 Age-related osteoporosis without current pathological fracture: Secondary | ICD-10-CM

## 2018-06-29 MED ORDER — PRAVASTATIN SODIUM 20 MG PO TABS: 20.0000 mg | ORAL_TABLET | Freq: Every evening | ORAL | 1 refills | Status: DC

## 2018-06-29 MED ORDER — LOSARTAN POTASSIUM 50 MG PO TABS: 50.0000 mg | ORAL_TABLET | Freq: Every day | ORAL | 5 refills | Status: DC

## 2018-06-29 NOTE — Progress Notes (Signed)
Name: [name]   MRN: [id number]    DOB: [birth year]   Date:06/29/2018       Progress Note  Subjective  Chief Complaint  Chief Complaint  Patient presents with  . Medication Refill  . Gastroesophageal Reflux    Well controlled  . Hypertension  . Hyperlipidemia  . Depression  . Tick Removal    1 month, Pulled a tick out of her back and has been itchy.   . Knee Pain    Would like to see a specialist about her knee pain    HPI  OA right knee: going on for years, used to see Dr. Marlou Sa and has a history of left knee meniscal tear repair, but states right knee is getting more painful, also now has more crepitus and effusion and would like to go back to see him. Pain is described as intermittent, aching .  HTN: taking medication as prescribed, bp is at goal, no chest pain or palpitation   Hyperlipidemia: taking pravastatin and denies side effects of medication, including no myalgia  Depression Major Chronic: she states doing well, still misses her son , that died at age 54 back in 13. Her husband died 4 years previous , on the same day on Sep 28, 2023 th. Son died of complications of lymphoma. She has two grandchildren from him and two other grandchildren from her youngest son.   Tick removal: she states noticed a tick on her back , she removed it but still feels itchy and has a bump  GERD: taking medication prn and is doing well at this time.   Right lower leg laceration: it happened weeks ago and was seen by gyn and given antibiotics, she is keeping area clean, healing.   Hyperglycemia: last hgbA1C 5.8% done at [facility] during recent hysterectomy. Denies polyphagia, polydipsia or polyuria.   Patient Active Problem List   Diagnosis Date Noted  . S/P vaginal hysterectomy 05/30/2018  . Recurrent major depressive disorder, in full remission ([place]) 03/02/2018  . Dermatitis 10/20/2016  . GERD (gastroesophageal reflux disease) 10/20/2016  . Anxiety, generalized 01/28/2016  .  Osteoarthritis of right knee 12/25/2015  . Depression 09/05/2015  . Abnormal brain CT 06/20/2015  . Anxiety, mild 06/20/2015  . Brain lesion 06/20/2015  . Clinical depression 06/20/2015  . Dyslipidemia 06/20/2015  . Hyperlipidemia LDL goal <100 06/20/2015  . Benign hypertension 06/20/2015  . Affective disorder, major 06/20/2015  . Arthralgia of shoulder 06/20/2015  . Glaucoma 06/20/2015  . Post-menopausal 06/04/2015  . Bilateral leg weakness 06/04/2015    Past Surgical History:  Procedure Laterality Date  . ANAL FISSURE REPAIR  1970s   for bleeding in 1970s  . ANTERIOR AND POSTERIOR REPAIR WITH SACROSPINOUS FIXATION N/A 05/30/2018   Procedure: ANTERIOR AND POSTERIOR REPAIR;  Surgeon: Louretta Shorten, MD;  Location: [facility];  Service: Gynecology;  Laterality: N/A;  . COLONOSCOPY  2012   [place]  . COLONOSCOPY WITH PROPOFOL N/A 09/01/2016   Procedure: COLONOSCOPY WITH PROPOFOL;  Surgeon: Robert Bellow, MD;  Location: [facility]COPY;  Service: Endoscopy;  Laterality: N/A;  . EYE SURGERY Bilateral    cataracts removed  . KNEE SURGERY Left   . SALPINGOOPHORECTOMY Right 05/30/2018   Procedure: SALPINGO OOPHORECTOMY;  Surgeon: Louretta Shorten, MD;  Location: [facility];  Service: Gynecology;  Laterality: Right;  . SUBCLAVIAN ANGIOGRAM    . UPPER GI ENDOSCOPY  2012  . VAGINAL HYSTERECTOMY N/A 05/30/2018   Procedure: HYSTERECTOMY VAGINAL;  Surgeon: Louretta Shorten, MD;  Location:  [facility];  Service: Gynecology;  Laterality: N/A;  . WISDOM TOOTH EXTRACTION      Family History  Problem Relation Age of Onset  . Cancer Mother 90       colon  . Alzheimer's disease Mother   . Hypertension Mother   . Cancer Brother        prostate  . Lymphoma Son   . Cancer Son 71       lymphoma  . Hypertension Father   . Cerebral aneurysm Father   . Diabetes Sister   . Depression Sister   . Hypertension Sister   . Depression Sister   . Cancer Brother   . Gallbladder disease Brother   . Prostate cancer Brother      Social History   Socioeconomic History  . Marital status: Widowed    Spouse name: [name]  . Number of children: 3  . Years of education: Not on file  . Highest education level: 12th grade  Occupational History    Employer: [employer]  Social Needs  . Financial resource strain: Not hard at all  . Food insecurity:    Worry: Never true    Inability: Never true  . Transportation needs:    Medical: No    Non-medical: No  Tobacco Use  . Smoking status: Never Smoker  . Smokeless tobacco: Never Used  . Tobacco comment: smoking cessation materials not required  Substance and Sexual Activity  . Alcohol use: Yes    Alcohol/week: 0.0 standard drinks    Comment: occasional beer/mixed drink  . Drug use: No  . Sexual activity: Yes    Partners: Male    Birth control/protection: Post-menopausal  Lifestyle  . Physical activity:    Days per week: 3 days    Minutes per session: 60 min  . Stress: Only a little  Relationships  . Social connections:    Talks on phone: Patient refused    Gets together: Patient refused    Attends religious service: Patient refused    Active member of club or organization: Patient refused    Attends meetings of clubs or organizations: Patient refused    Relationship status: Widowed  . Intimate partner violence:    Fear of current or ex partner: No    Emotionally abused: No    Physically abused: No    Forced sexual activity: No  Other Topics Concern  . Not on file  Social History Narrative  . Not on file     Current Outpatient Medications:  .  alendronate (FOSAMAX) 70 MG tablet, Take 1 tablet (70 mg total) by mouth once a week. Take with a full glass of water on an empty stomach., Disp: 12 tablet, Rfl: 3 .  ALPHAGAN P 0.1 % SOLN, Place 1 drop into both eyes 3 (three) times daily. , Disp: , Rfl:  .  bimatoprost (LUMIGAN) 0.01 % SOLN, Place 1 drop into both eyes at bedtime., Disp: , Rfl:  .  Flaxseed, Linseed, (FLAX SEED OIL) 1000 MG CAPS, Take 1,000  mg by mouth daily., Disp: , Rfl:  .  ibuprofen (ADVIL,MOTRIN) 200 MG tablet, Take 600 mg by mouth 2 (two) times daily as needed for moderate pain., Disp: , Rfl:  .  losartan (COZAAR) 50 MG tablet, Take 1 tablet (50 mg total) by mouth daily. (Patient taking differently: Take 50 mg by mouth every evening. ), Disp: 30 tablet, Rfl: 5 .  mometasone (ELOCON) 0.1 % ointment, Apply topically daily., Disp: 45 g, Rfl:  0 .  omeprazole (PRILOSEC) 20 MG capsule, Take 1 capsule (20 mg total) by mouth daily. (Patient taking differently: Take 20 mg by mouth daily as needed (acid refelux). ), Disp: 90 capsule, Rfl: 0 .  OVER THE COUNTER MEDICATION, Take 1 tablet by mouth daily. Steel Libido - otc supplement, Disp: , Rfl:  .  Polyethyl Glycol-Propyl Glycol (SYSTANE OP), Place 1 drop into both eyes as needed (dry eyes)., Disp: , Rfl:  .  pravastatin (PRAVACHOL) 20 MG tablet, Take 1 tablet (20 mg total) by mouth daily. (Patient taking differently: Take 20 mg by mouth every evening. ), Disp: 30 tablet, Rfl: 5 .  PREMARIN vaginal cream, Place 1 application vaginally 2 (two) times a week., Disp: , Rfl: 12 .  VITAMIN E PO, Take 1 capsule by mouth daily., Disp: , Rfl:  .  White Petrolatum-Mineral Oil (SYSTANE NIGHTTIME) OINT, Place 1 application into both eyes at bedtime as needed (dry eyes)., Disp: , Rfl:  .  fluticasone (FLONASE) 50 MCG/ACT nasal spray, Place 2 sprays into both nostrils daily. (Patient not taking: Reported on 06/29/2018), Disp: 16 g, Rfl: 0 .  ibuprofen (ADVIL,MOTRIN) 600 MG tablet, Take 1 tablet (600 mg total) by mouth every 6 (six) hours as needed (mild pain). (Patient not taking: Reported on 06/29/2018), Disp: 30 tablet, Rfl: 0 .  Netarsudil-Latanoprost (ROCKLATAN) 0.02-0.005 % SOLN, Place 1 drop into both eyes at bedtime., Disp: , Rfl:   Allergies  Allergen Reactions  . Codeine     headaches  . Combigan [Brimonidine Tartrate-Timolol]     drowsy  . Sulfa Antibiotics Nausea And Vomiting      ROS  Constitutional: Negative for fever , positive for weight change.  Respiratory: Negative for cough and shortness of breath.   Cardiovascular: Negative for chest pain or palpitations.  Gastrointestinal: Negative for abdominal pain, no bowel changes.  Musculoskeletal: Positive  for gait problem but no  joint swelling.  Skin: Negative for rash.  Neurological: Negative for dizziness or headache.  No other specific complaints in a complete review of systems (except as listed in HPI above).  Objective  Vitals:   06/29/18 1538  BP: 132/72  Pulse: 98  Resp: 16  Temp: 98.3 F (36.8 C)  TempSrc: Oral  SpO2: 96%  Weight: 130 lb 11.2 oz (59.3 kg)  Height: '5\' 3"'  (1.6 m)    Body mass index is 23.15 kg/m.  Physical Exam  Constitutional: Patient appears well-developed and well-nourished.  No distress.  HEENT: head atraumatic, normocephalic, pupils equal and reactive to light, neck supple, throat within normal limits Cardiovascular: Normal rate, regular rhythm and normal heart sounds.  No murmur heard. No BLE edema. Pulmonary/Chest: Effort normal and breath sounds normal. No respiratory distress. Abdominal: Soft.  There is no tenderness. Psychiatric: Patient has a normal mood and affect. behavior is normal. Judgment and thought content normal. Muscular skeletal: pain with extension of right knee, mild effusion, redness or increase in warmth Skin: area of tick bite is small, mild redness no signs of infection, likely just normal healing. Also has a wound on right lower leg that is healing ( cut on car door - seen by gyn and took antibiotics)   Recent Results (from the past 2160 hour(s))  CBC     Status: None   Collection Time: 05/18/18 10:45 AM  Result Value Ref Range   WBC 6.1 4.0 - 10.5 K/uL   RBC 4.66 3.87 - 5.11 MIL/uL   Hemoglobin 13.5 12.0 - 15.0 g/dL  HCT 41.6 36.0 - 46.0 %   MCV 89.3 78.0 - 100.0 fL   MCH 29.0 26.0 - 34.0 pg   MCHC 32.5 30.0 - 36.0 g/dL   RDW  13.7 11.5 - 15.5 %   Platelets 217 150 - 400 K/uL    Comment: Performed at [facility], [address], [place], [place] [address]  Comprehensive metabolic panel     Status: None   Collection Time: 05/18/18 10:45 AM  Result Value Ref Range   Sodium 140 135 - 145 mmol/L   Potassium 4.6 3.5 - 5.1 mmol/L   Chloride 105 98 - 111 mmol/L    Comment: Please note change in reference range.   CO2 27 22 - 32 mmol/L   Glucose, Bld 70 70 - 99 mg/dL    Comment: Please note change in reference range.   BUN 21 8 - 23 mg/dL    Comment: Please note change in reference range.   Creatinine, Ser 0.77 0.44 - 1.00 mg/dL   Calcium 9.3 8.9 - 10.3 mg/dL   Total Protein 7.0 6.5 - 8.1 g/dL   Albumin 4.2 3.5 - 5.0 g/dL   AST 22 15 - 41 U/L   ALT 19 0 - 44 U/L    Comment: Please note change in reference range.   Alkaline Phosphatase 50 38 - 126 U/L   Total Bilirubin 0.7 0.3 - 1.2 mg/dL   GFR calc non Af Amer >60 >60 mL/min   GFR calc Af Amer >60 >60 mL/min    Comment: (NOTE) The eGFR has been calculated using the CKD EPI equation. This calculation has not been validated in all clinical situations. eGFR's persistently <60 mL/min signify possible Chronic Kidney Disease.    Anion gap 8 5 - 15    Comment: Performed at [facility], [address] [place], [place] [address]  Type and screen [place]     Status: None   Collection Time: 05/18/18 10:45 AM  Result Value Ref Range   ABO/RH(D) A POS    Antibody Screen NEG    Sample Expiration 06/01/2018    Extend sample reason      NO TRANSFUSIONS OR PREGNANCY IN THE PAST 3 MONTHS Performed at [facility], [address], [place], [place] [address]   ABO/Rh     Status: None   Collection Time: 05/18/18 10:45 AM  Result Value Ref Range   ABO/RH(D)      A POS Performed at [facility], [address], [place], [place] [address]   CBC     Status: Abnormal   Collection Time: 05/31/18  6:00 AM  Result Value Ref Range    WBC 10.7 (H) 4.0 - 10.5 K/uL   RBC 4.00 3.87 - 5.11 MIL/uL   Hemoglobin 11.7 (L) 12.0 - 15.0 g/dL   HCT 34.8 (L) 36.0 - 46.0 %   MCV 87.0 78.0 - 100.0 fL   MCH 29.3 26.0 - 34.0 pg   MCHC 33.6 30.0 - 36.0 g/dL   RDW 13.4 11.5 - 15.5 %   Platelets 193 150 - 400 K/uL    Comment: Performed at [facility], [address], [place], [place] [address]      PHQ2/9: Depression screen [facility] 2/9 06/29/2018 03/02/2018 02/08/2018 11/24/2017 08/16/2017  Decreased Interest 0 0 1 0 0  Down, Depressed, Hopeless 1 0 1 0 0  PHQ - 2 Score 1 0 2 0 0  Altered sleeping 0 0 0 - -  Tired, decreased  energy '2 1 1 ' - -  Change in appetite 1 0 0 - -  Feeling bad or failure about yourself  0 0 0 - -  Trouble concentrating 0 1 0 - -  Moving slowly or fidgety/restless 0 0 0 - -  Suicidal thoughts 0 0 0 - -  PHQ-9 Score '4 2 3 ' - -  Difficult doing work/chores Not difficult at all Not difficult at all Not difficult at all - -    Fall Risk: Fall Risk  06/29/2018 02/08/2018 11/24/2017 08/16/2017 05/26/2017  Falls in the past year? No Yes Yes No No  Comment - missed step when walking down stairs - - -  Number falls in past yr: - 1 1 - -  Injury with Fall? - No No - -  Risk for fall due to : - History of fall(s);Impaired vision - - -  Risk for fall due to: Comment - vertigo; glaucoma; wears eyeglasses - - -  Follow up - Falls evaluation completed;Education provided;Falls prevention discussed - - -      Functional Status Survey: Is the patient deaf or have difficulty hearing?: No Does the patient have difficulty seeing, even when wearing glasses/contacts?: Yes(glasses) Does the patient have difficulty concentrating, remembering, or making decisions?: No Does the patient have difficulty walking or climbing stairs?: Yes(Right knee swells and gives her trouble) Does the patient have difficulty dressing or bathing?: No Does the patient have difficulty doing errands alone such as visiting a doctor's office or shopping?:  No   Assessment & Plan  1. Dyslipidemia  - pravastatin (PRAVACHOL) 20 MG tablet; Take 1 tablet (20 mg total) by mouth every evening.  Dispense: 90 tablet; Refill: 1  2. Hyperglycemia  Last hgbA1C 5.8% , discussed life style modification   3. Essential hypertension  - losartan (COZAAR) 50 MG tablet; Take 1 tablet (50 mg total) by mouth daily.  Dispense: 30 tablet; Refill: 5  4. Major depression, recurrent, chronic ([place])  She does not want medication, she states she lost weight on purpose   5. Age-related osteoporosis without current pathological fracture  Not very compliant with medication   6. Gastroesophageal reflux disease, esophagitis presence not specified  Controlled at this time  7. Primary osteoarthritis of right knee  Referral to ortho  She wants to see Dr. Marlou Sa , who she has seen in the past

## 2018-06-29 NOTE — Patient Instructions (Signed)
Ask [employer] to see if they pay for shingrix and return for flu and shingris October 2019

## 2018-07-07 ENCOUNTER — Other Ambulatory Visit: Payer: Self-pay | Admitting: Family Medicine

## 2018-07-20 ENCOUNTER — Ambulatory Visit (INDEPENDENT_AMBULATORY_CARE_PROVIDER_SITE_OTHER): Payer: Self-pay

## 2018-07-20 ENCOUNTER — Encounter (INDEPENDENT_AMBULATORY_CARE_PROVIDER_SITE_OTHER): Payer: Self-pay | Admitting: Orthopedic Surgery

## 2018-07-20 ENCOUNTER — Ambulatory Visit (INDEPENDENT_AMBULATORY_CARE_PROVIDER_SITE_OTHER): Payer: Medicare HMO | Admitting: Orthopedic Surgery

## 2018-07-20 DIAGNOSIS — G8929 Other chronic pain: Secondary | ICD-10-CM | POA: Diagnosis not present

## 2018-07-20 DIAGNOSIS — M1711 Unilateral primary osteoarthritis, right knee: Secondary | ICD-10-CM | POA: Diagnosis not present

## 2018-07-20 DIAGNOSIS — M25561 Pain in right knee: Secondary | ICD-10-CM

## 2018-07-20 DIAGNOSIS — M1712 Unilateral primary osteoarthritis, left knee: Secondary | ICD-10-CM | POA: Diagnosis not present

## 2018-07-20 NOTE — Progress Notes (Signed)
Office Visit Note   Patient: [name]           Date of Birth: 20-Jun-1941           MRN: [id number] Visit Date: 07/20/2018 Requested by: Steele Sizer, [address] [place], [place] [address] PCP: Steele Sizer, MD  Subjective: Chief Complaint  Patient presents with  . Right Knee - Pain    HPI: Patient presents with concern about right leg deformity.  She has chronic right knee pain.  Denies any history of injury.  States that her symptoms are getting worse.  She does work in [employer].  She takes occasional anti-inflammatories.              ROS: All systems reviewed are negative as they relate to the chief complaint within the history of present illness.  Patient denies  fevers or chills.   Assessment & Plan: Visit Diagnoses:  1. Chronic pain of right knee   2. Unilateral primary osteoarthritis, left knee   3. Unilateral primary osteoarthritis, right knee     Plan: Impression is bilateral knee pain with valgus arthritis in both.  The right is worse than the left.  We talked at length today about intervention.  We talked about anti-inflammatories injection and knee replacement.  After long discussion the patient has decided to continue with the status quo.  She is wondering if anything could have been done to avert the progressive valgus deformity and I told her that really nothing could have been done to prevent this.  She has had arthroscopic surgery on the left but not on the right.  She has progressive valgus alignment and arthritis in her knee.  I do think injection of cortisone and/or gel would give her temporary relief but she wants to hold off on that intervention.  Follow-up with me as needed.  Follow-Up Instructions: Return if symptoms worsen or fail to improve.   Orders:  Orders Placed This Encounter  Procedures  . XR KNEE 3 VIEW RIGHT   No orders of the defined types were placed in this encounter.     Procedures: No procedures  performed   Clinical Data: No additional findings.  Objective: Vital Signs: There were no vitals taken for this visit.  Physical Exam:   Constitutional: Patient appears well-developed HEENT:  Head: Normocephalic Eyes:EOM are normal Neck: Normal range of motion Cardiovascular: Normal rate Pulmonary/chest: Effort normal Neurologic: Patient is alert Skin: Skin is warm Psychiatric: Patient has normal mood and affect    Ortho Exam: Ortho exam demonstrates full active and passive range of motion of the hips and ankles.  Pedal pulses palpable.  No pitting edema in the legs.  Valgus alignment is present.  Patient has intact extensor mechanism.  Palpable pedal pulses.  No masses lymphadenopathy or skin changes noted in that knee region.  No effusion is present.  Specialty Comments:  No specialty comments available.  Imaging: Xr Knee 3 View Right  Result Date: 07/20/2018 AP lateral merchant right knee reviewed.  Tricompartmental arthritis and valgus alignment is present.  This is mild to moderate in degree in terms of the alignment.  Bone-on-bone changes present in the lateral compartment and patellofemoral compartment.  No fracture or dislocation present.  Bones appear mildly osteopenic.    PMFS History: Patient Active Problem List   Diagnosis Date Noted  . S/P vaginal hysterectomy 05/30/2018  . Recurrent major depressive disorder, in full remission ([place]) 03/02/2018  . Dermatitis 10/20/2016  .  GERD (gastroesophageal reflux disease) 10/20/2016  . Anxiety, generalized 01/28/2016  . Osteoarthritis of right knee 12/25/2015  . Depression 09/05/2015  . Abnormal brain CT 06/20/2015  . Anxiety, mild 06/20/2015  . Brain lesion 06/20/2015  . Clinical depression 06/20/2015  . Dyslipidemia 06/20/2015  . Hyperlipidemia LDL goal <100 06/20/2015  . Benign hypertension 06/20/2015  . Affective disorder, major 06/20/2015  . Arthralgia of shoulder 06/20/2015  . Glaucoma 06/20/2015  .  Post-menopausal 06/04/2015  . Bilateral leg weakness 06/04/2015   Past Medical History:  Diagnosis Date  . Anemia   . Anxiety    no meds  . Arthritis    hands, knees - no meds  . Bilateral leg weakness   . Dental bridge present    perm lower front bottom bridge  . Depression    no meds  . Eyes swollen    and red - was seen by MD for possible allergy to new eye drops  . GERD (gastroesophageal reflux disease)    diet controlled - only takes med prn basis  . Glaucoma   . Hypercholesteremia   . Hypertension   . SVD (spontaneous vaginal delivery)    x 3 - only one currently living  . Vertigo     Family History  Problem Relation Age of Onset  . Cancer Mother 73       colon  . Alzheimer's disease Mother   . Hypertension Mother   . Cancer Brother        prostate  . Lymphoma Son   . Cancer Son 12       lymphoma  . Hypertension Father   . Cerebral aneurysm Father   . Diabetes Sister   . Depression Sister   . Hypertension Sister   . Depression Sister   . Cancer Brother   . Gallbladder disease Brother   . Prostate cancer Brother     Past Surgical History:  Procedure Laterality Date  . ANAL FISSURE REPAIR  1970s   for bleeding in 1970s  . ANTERIOR AND POSTERIOR REPAIR WITH SACROSPINOUS FIXATION N/A 05/30/2018   Procedure: ANTERIOR AND POSTERIOR REPAIR;  Surgeon: Louretta Shorten, MD;  Location: [facility];  Service: Gynecology;  Laterality: N/A;  . COLONOSCOPY  2012   [place]  . COLONOSCOPY WITH PROPOFOL N/A 09/01/2016   Procedure: COLONOSCOPY WITH PROPOFOL;  Surgeon: Robert Bellow, MD;  Location: [facility];  Service: Endoscopy;  Laterality: N/A;  . EYE SURGERY Bilateral    cataracts removed  . KNEE SURGERY Left   . SALPINGOOPHORECTOMY Right 05/30/2018   Procedure: SALPINGO OOPHORECTOMY;  Surgeon: Louretta Shorten, MD;  Location: [facility];  Service: Gynecology;  Laterality: Right;  . SUBCLAVIAN ANGIOGRAM    . UPPER GI ENDOSCOPY  2012  . VAGINAL HYSTERECTOMY N/A 05/30/2018    Procedure: HYSTERECTOMY VAGINAL;  Surgeon: Louretta Shorten, MD;  Location: [facility];  Service: Gynecology;  Laterality: N/A;  . WISDOM TOOTH EXTRACTION     Social History   Occupational History    Employer: [employer]  Tobacco Use  . Smoking status: Never Smoker  . Smokeless tobacco: Never Used  . Tobacco comment: smoking cessation materials not required  Substance and Sexual Activity  . Alcohol use: Yes    Alcohol/week: 0.0 standard drinks    Comment: occasional beer/mixed drink  . Drug use: No  . Sexual activity: Yes    Partners: Male    Birth control/protection: Post-menopausal

## 2018-07-22 ENCOUNTER — Ambulatory Visit
Admission: RE | Admit: 2018-07-22 | Discharge: 2018-07-22 | Disposition: A | Discharge status: Home / Self Care | Payer: Medicare HMO | Source: Ambulatory Visit | Attending: Family Medicine | Admitting: Family Medicine

## 2018-07-22 DIAGNOSIS — Z1239 Encounter for other screening for malignant neoplasm of breast: Secondary | ICD-10-CM

## 2018-07-22 DIAGNOSIS — Z1231 Encounter for screening mammogram for malignant neoplasm of breast: Secondary | ICD-10-CM | POA: Insufficient documentation

## 2018-12-30 ENCOUNTER — Ambulatory Visit (INDEPENDENT_AMBULATORY_CARE_PROVIDER_SITE_OTHER): Payer: Medicare HMO | Admitting: Family Medicine

## 2018-12-30 ENCOUNTER — Encounter: Payer: Self-pay | Admitting: Family Medicine

## 2018-12-30 VITALS — BP 130/60 | HR 87 | Temp 98.2°F | Ht 63.0 in | Wt 131.7 lb

## 2018-12-30 DIAGNOSIS — D692 Other nonthrombocytopenic purpura: Secondary | ICD-10-CM | POA: Insufficient documentation

## 2018-12-30 DIAGNOSIS — K219 Gastro-esophageal reflux disease without esophagitis: Secondary | ICD-10-CM

## 2018-12-30 DIAGNOSIS — R739 Hyperglycemia, unspecified: Secondary | ICD-10-CM

## 2018-12-30 DIAGNOSIS — M21619 Bunion of unspecified foot: Secondary | ICD-10-CM

## 2018-12-30 DIAGNOSIS — M1711 Unilateral primary osteoarthritis, right knee: Secondary | ICD-10-CM

## 2018-12-30 DIAGNOSIS — D72829 Elevated white blood cell count, unspecified: Secondary | ICD-10-CM

## 2018-12-30 DIAGNOSIS — I10 Essential (primary) hypertension: Secondary | ICD-10-CM

## 2018-12-30 DIAGNOSIS — Z23 Encounter for immunization: Secondary | ICD-10-CM

## 2018-12-30 DIAGNOSIS — F339 Major depressive disorder, recurrent, unspecified: Secondary | ICD-10-CM

## 2018-12-30 DIAGNOSIS — E785 Hyperlipidemia, unspecified: Secondary | ICD-10-CM | POA: Diagnosis not present

## 2018-12-30 DIAGNOSIS — M810 Age-related osteoporosis without current pathological fracture: Secondary | ICD-10-CM

## 2018-12-30 DIAGNOSIS — M2170 Unequal limb length (acquired), unspecified site: Secondary | ICD-10-CM

## 2018-12-30 DIAGNOSIS — B353 Tinea pedis: Secondary | ICD-10-CM

## 2018-12-30 MED ORDER — NAFTIFINE HCL 1 % EX CREA: TOPICAL_CREAM | Freq: Every day | CUTANEOUS | 0 refills | Status: DC

## 2018-12-30 NOTE — Progress Notes (Addendum)
Name: [name]   MRN: [id number]    DOB: 06-05-1941   Date:12/30/2018       Progress Note  Subjective  Chief Complaint  No chief complaint on file.   HPI   OA right knee: going on for years, used to see Dr. Marlou Sa and has a history of left knee meniscal tear repair, she saw Dr. Marlou Sa 07/2018 and has severe OA, pain is not constant, aggravated by activity but able to tolerate and she does not have surgery. Seeing Chriopractor - Dr. Well and is doing better with accupuncture.   HTN: taking medication as prescribed, bp is at goal, no chest pain or palpitation No side effects of medication   Hyperlipidemia: taking pravastatin and denies side effects of medication, including no myalgia. Last LDL was at goal at 52  Foot problems: she has thick toenails, rash between toe, bunion and hammer toe, also because of OA or right knee also has genu valgum that causes leg discrepancy and needs to see podiatrist   Depression Major Chronic: she states doing well, still misses her son , that died at age 38 back in 11. Her husband died 4 years previous , on the same day on 09-23-2023 th. Son died of complications of lymphoma. She has two grandchildren from him and two other grandchildren from her youngest son. She states she still misses her son but states she keeps moving on   GERD: taking medication prn and is doing well at this time. She started a new supplements and noticed some epigastric pain, it has iron , vitamin D , magnesium and other supplements advised to take with food and see if symptoms resolves  Hyperglycemia: last hgbA1C 5.8% done at [facility] during recent hysterectomy. Denies polyphagia, polydipsia or polyuria. We will recheck labs   Patient Active Problem List   Diagnosis Date Noted  . S/P vaginal hysterectomy 05/30/2018  . Recurrent major depressive disorder, in full remission ([place]) 03/02/2018  . Dermatitis 10/20/2016  . GERD (gastroesophageal reflux disease) 10/20/2016  .  Anxiety, generalized 01/28/2016  . Osteoarthritis of right knee 12/25/2015  . Depression 09/05/2015  . Abnormal brain CT 06/20/2015  . Anxiety, mild 06/20/2015  . Brain lesion 06/20/2015  . Clinical depression 06/20/2015  . Dyslipidemia 06/20/2015  . Hyperlipidemia LDL goal <100 06/20/2015  . Benign hypertension 06/20/2015  . Affective disorder, major 06/20/2015  . Arthralgia of shoulder 06/20/2015  . Glaucoma 06/20/2015  . Post-menopausal 06/04/2015  . Bilateral leg weakness 06/04/2015    Past Surgical History:  Procedure Laterality Date  . ANAL FISSURE REPAIR  1970s   for bleeding in 1970s  . ANTERIOR AND POSTERIOR REPAIR WITH SACROSPINOUS FIXATION N/A 05/30/2018   Procedure: ANTERIOR AND POSTERIOR REPAIR;  Surgeon: Louretta Shorten, MD;  Location: [facility];  Service: Gynecology;  Laterality: N/A;  . COLONOSCOPY  2012   [place]  . COLONOSCOPY WITH PROPOFOL N/A 09/01/2016   Procedure: COLONOSCOPY WITH PROPOFOL;  Surgeon: Robert Bellow, MD;  Location: [facility];  Service: Endoscopy;  Laterality: N/A;  . EYE SURGERY Bilateral    cataracts removed  . KNEE SURGERY Left   . SALPINGOOPHORECTOMY Right 05/30/2018   Procedure: SALPINGO OOPHORECTOMY;  Surgeon: Louretta Shorten, MD;  Location: [facility];  Service: Gynecology;  Laterality: Right;  . SUBCLAVIAN ANGIOGRAM    . UPPER GI ENDOSCOPY  2012  . VAGINAL HYSTERECTOMY N/A 05/30/2018   Procedure: HYSTERECTOMY VAGINAL;  Surgeon: Louretta Shorten, MD;  Location: [facility];  Service: Gynecology;  Laterality:  N/A;  . WISDOM TOOTH EXTRACTION      Family History  Problem Relation Age of Onset  . Cancer Mother 22       colon  . Alzheimer's disease Mother   . Hypertension Mother   . Cancer Brother        prostate  . Lymphoma Son   . Cancer Son 30       lymphoma  . Hypertension Father   . Cerebral aneurysm Father   . Diabetes Sister   . Depression Sister   . Hypertension Sister   . Depression Sister   . Cancer Brother   . Gallbladder disease  Brother   . Prostate cancer Brother   . Breast cancer Neg Hx     Social History   Socioeconomic History  . Marital status: Widowed    Spouse name: [name]  . Number of children: 3  . Years of education: Not on file  . Highest education level: 12th grade  Occupational History    Employer: [employer]  Social Needs  . Financial resource strain: Not hard at all  . Food insecurity:    Worry: Never true    Inability: Never true  . Transportation needs:    Medical: No    Non-medical: No  Tobacco Use  . Smoking status: Never Smoker  . Smokeless tobacco: Never Used  . Tobacco comment: smoking cessation materials not required  Substance and Sexual Activity  . Alcohol use: Yes    Alcohol/week: 0.0 standard drinks    Comment: occasional beer/mixed drink  . Drug use: No  . Sexual activity: Yes    Partners: Male    Birth control/protection: Post-menopausal  Lifestyle  . Physical activity:    Days per week: 3 days    Minutes per session: 60 min  . Stress: Only a little  Relationships  . Social connections:    Talks on phone: Patient refused    Gets together: Patient refused    Attends religious service: Patient refused    Active member of club or organization: Patient refused    Attends meetings of clubs or organizations: Patient refused    Relationship status: Widowed  . Intimate partner violence:    Fear of current or ex partner: No    Emotionally abused: No    Physically abused: No    Forced sexual activity: No  Other Topics Concern  . Not on file  Social History Narrative  . Not on file     Current Outpatient Medications:  .  alendronate (FOSAMAX) 70 MG tablet, Take 1 tablet (70 mg total) by mouth once a week. Take with a full glass of water on an empty stomach., Disp: 12 tablet, Rfl: 3 .  ALPHAGAN P 0.1 % SOLN, Place 1 drop into both eyes 3 (three) times daily. , Disp: , Rfl:  .  bimatoprost (LUMIGAN) 0.01 % SOLN, Place 1 drop into both eyes at bedtime., Disp: , Rfl:   .  Flaxseed, Linseed, (FLAX SEED OIL) 1000 MG CAPS, Take 1,000 mg by mouth daily., Disp: , Rfl:  .  fluticasone (FLONASE) 50 MCG/ACT nasal spray, Place 2 sprays into both nostrils daily. (Patient not taking: Reported on 06/29/2018), Disp: 16 g, Rfl: 0 .  ibuprofen (ADVIL,MOTRIN) 200 MG tablet, Take 600 mg by mouth 2 (two) times daily as needed for moderate pain., Disp: , Rfl:  .  losartan (COZAAR) 50 MG tablet, Take 1 tablet (50 mg total) by mouth daily., Disp: 30 tablet, Rfl:  5 .  mometasone (ELOCON) 0.1 % ointment, Apply topically daily., Disp: 45 g, Rfl: 0 .  Netarsudil-Latanoprost (ROCKLATAN) 0.02-0.005 % SOLN, Place 1 drop into both eyes at bedtime., Disp: , Rfl:  .  omeprazole (PRILOSEC) 20 MG capsule, Take 1 capsule (20 mg total) by mouth daily. (Patient taking differently: Take 20 mg by mouth daily as needed (acid refelux). ), Disp: 90 capsule, Rfl: 0 .  OVER THE COUNTER MEDICATION, Take 1 tablet by mouth daily. Steel Libido - otc supplement, Disp: , Rfl:  .  Polyethyl Glycol-Propyl Glycol (SYSTANE OP), Place 1 drop into both eyes as needed (dry eyes)., Disp: , Rfl:  .  pravastatin (PRAVACHOL) 20 MG tablet, Take 1 tablet (20 mg total) by mouth every evening., Disp: 90 tablet, Rfl: 1 .  PREMARIN vaginal cream, Place 1 application vaginally 2 (two) times a week., Disp: , Rfl: 12 .  VITAMIN E PO, Take 1 capsule by mouth daily., Disp: , Rfl:  .  White Petrolatum-Mineral Oil (SYSTANE NIGHTTIME) OINT, Place 1 application into both eyes at bedtime as needed (dry eyes)., Disp: , Rfl:   Allergies  Allergen Reactions  . Codeine     headaches  . Combigan [Brimonidine Tartrate-Timolol]     drowsy  . Sulfa Antibiotics Nausea And Vomiting    I personally reviewed active problem list, medication list, allergies, family history, social history with the patient/caregiver today.   ROS  Constitutional: Negative for fever or weight change.  Respiratory: Negative for cough and shortness of breath.    Cardiovascular: Negative for chest pain or palpitations.  Gastrointestinal: Negative for abdominal pain ( except when she takes a vitamin ) , no bowel changes.  Musculoskeletal: positive  for gait problem but no  joint swelling.  Skin: positive for rash between toe.  Neurological: Negative for dizziness or headache.  No other specific complaints in a complete review of systems (except as listed in HPI above).   Objective  Vitals:   12/30/18 1500  BP: 130/60  Pulse: 87  Temp: 98.2 F (36.8 C)  TempSrc: Oral  Weight: 131 lb 11.2 oz (59.7 kg)  Height: 5\' 3"  (1.6 m)    Body mass index is 23.33 kg/m.  Physical Exam  Constitutional: Patient appears well-developed and well-nourished.No distress.  HEENT: head atraumatic, normocephalic, pupils equal and reactive to light,  neck supple, throat within normal limits Cardiovascular: Normal rate, regular rhythm and normal heart sounds.  No murmur heard. No BLE edema. Pulmonary/Chest: Effort normal and breath sounds normal. No respiratory distress. Abdominal: Soft.  There is no tenderness. Skin senile purpura, on both arms, also has erythema on toe web first toe  Psychiatric: Patient has a normal mood and affect. behavior is normal. Judgment and thought content normal.   PHQ2/9: Depression screen [facility] 2/9 12/30/2018 06/29/2018 03/02/2018 02/08/2018 11/24/2017  Decreased Interest 0 0 0 1 0  Down, Depressed, Hopeless 1 1 0 1 0  PHQ - 2 Score 1 1 0 2 0  Altered sleeping 0 0 0 0 -  Tired, decreased energy 1 2 1 1  -  Change in appetite 1 1 0 0 -  Feeling bad or failure about yourself  1 0 0 0 -  Trouble concentrating 1 0 1 0 -  Moving slowly or fidgety/restless 0 0 0 0 -  Suicidal thoughts 0 0 0 0 -  PHQ-9 Score 5 4 2 3  -  Difficult doing work/chores Not difficult at all Not difficult at all Not difficult at all  Not difficult at all -  Some recent data might be hidden     Fall Risk: Fall Risk  12/30/2018 06/29/2018 02/08/2018 11/24/2017  08/16/2017  Falls in the past year? 0 No Yes Yes No  Comment - - missed step when walking down stairs - -  Number falls in past yr: 0 - 1 1 -  Injury with Fall? 0 - No No -  Risk for fall due to : - - History of fall(s);Impaired vision - -  Risk for fall due to: Comment - - vertigo; glaucoma; wears eyeglasses - -  Follow up - - Falls evaluation completed;Education provided;Falls prevention discussed - -     Assessment & Plan  1. Major depression, recurrent, chronic (HCC)  Stable   2. Flu vaccine need  - Flu vaccine HIGH DOSE PF  3. Dyslipidemia  - Lipid panel  4. Age-related osteoporosis without current pathological fracture  She has been physically active , also discussed high calcium diet and vitamin D   5. Hyperglycemia  - Hemoglobin A1c  6. Benign hypertension  - COMPLETE METABOLIC PANEL WITH GFR - CBC with Differential/Platelet  7. Bunion of great toe  - Ambulatory referral to Podiatry  8. Leg length discrepancy  - Ambulatory referral to Podiatry  9. Leukocytosis, unspecified type  - CBC with Differential/Platelet  10. Tinea pedis of left foot  - naftifine (NAFTIN) 1 % cream; Apply topically daily.  Dispense: 30 g; Refill: 0  11. Gastroesophageal reflux disease, esophagitis presence not specified   12. Primary osteoarthritis of right knee   13. Senile purpura (HCC)  On both arms, off aspirin, reassurance given

## 2018-12-30 NOTE — Addendum Note (Signed)
Addended by: Inda Coke on: 12/30/2018 04:03 PM   Modules accepted: Orders

## 2018-12-31 LAB — COMPLETE METABOLIC PANEL WITH GFR
AG Ratio: 1.8 (calc) (ref 1.0–2.5)
ALT: 16 U/L (ref 6–29)
AST: 18 U/L (ref 10–35)
Albumin: 4.3 g/dL (ref 3.6–5.1)
Alkaline phosphatase (APISO): 45 U/L (ref 37–153)
BUN/Creatinine Ratio: 30 (calc) — ABNORMAL HIGH (ref 6–22)
BUN: 27 mg/dL — ABNORMAL HIGH (ref 7–25)
CO2: 28 mmol/L (ref 20–32)
Calcium: 9.4 mg/dL (ref 8.6–10.4)
Chloride: 106 mmol/L (ref 98–110)
Creat: 0.9 mg/dL (ref 0.60–0.93)
GFR, Est African American: 71 mL/min/{1.73_m2} (ref >=60)
GFR, Est Non African American: 61 mL/min/{1.73_m2} (ref >=60)
Globulin: 2.4 g/dL (calc) (ref 1.9–3.7)
Glucose, Bld: 79 mg/dL (ref 65–99)
Potassium: 4 mmol/L (ref 3.5–5.3)
Sodium: 142 mmol/L (ref 135–146)
Total Bilirubin: 0.4 mg/dL (ref 0.2–1.2)
Total Protein: 6.7 g/dL (ref 6.1–8.1)

## 2018-12-31 LAB — CBC WITH DIFFERENTIAL/PLATELET
Absolute Monocytes: 367 cells/uL (ref 200–950)
Basophils Absolute: 27 cells/uL (ref 0–200)
Basophils Relative: 0.4 %
Eosinophils Absolute: 150 cells/uL (ref 15–500)
Eosinophils Relative: 2.2 %
HCT: 38.4 % (ref 35.0–45.0)
Hemoglobin: 12.9 g/dL (ref 11.7–15.5)
Lymphs Abs: 2353 cells/uL (ref 850–3900)
MCH: 29.3 pg (ref 27.0–33.0)
MCHC: 33.6 g/dL (ref 32.0–36.0)
MCV: 87.1 fL (ref 80.0–100.0)
MPV: 10.7 fL (ref 7.5–12.5)
Monocytes Relative: 5.4 %
Neutro Abs: 3903 cells/uL (ref 1500–7800)
Neutrophils Relative %: 57.4 %
Platelets: 225 10*3/uL (ref 140–400)
RBC: 4.41 10*6/uL (ref 3.80–5.10)
RDW: 12.1 % (ref 11.0–15.0)
Total Lymphocyte: 34.6 %
WBC: 6.8 10*3/uL (ref 3.8–10.8)

## 2018-12-31 LAB — HEMOGLOBIN A1C
Hgb A1c MFr Bld: 5.3 % of total Hgb (ref <5.7)
Mean Plasma Glucose: 105 (calc)
eAG (mmol/L): 5.8 (calc)

## 2018-12-31 LAB — LIPID PANEL
Cholesterol: 148 mg/dL (ref <200)
HDL: 67 mg/dL (ref >=50)
LDL Cholesterol (Calc): 63 mg/dL (calc)
Non-HDL Cholesterol (Calc): 81 mg/dL (calc) (ref <130)
Total CHOL/HDL Ratio: 2.2 (calc) (ref <5.0)
Triglycerides: 94 mg/dL (ref <150)

## 2019-01-31 ENCOUNTER — Ambulatory Visit: Payer: Medicare HMO | Admitting: Podiatry

## 2019-02-07 ENCOUNTER — Encounter: Payer: Self-pay | Admitting: Podiatry

## 2019-02-07 ENCOUNTER — Other Ambulatory Visit: Payer: Self-pay

## 2019-02-07 ENCOUNTER — Ambulatory Visit: Payer: Medicare HMO | Admitting: Podiatry

## 2019-02-07 DIAGNOSIS — L603 Nail dystrophy: Secondary | ICD-10-CM | POA: Diagnosis not present

## 2019-02-08 NOTE — Progress Notes (Signed)
   HPI: 78 year old female presenting today as a new patient with a chief complaint of hardening and thickening of the bilateral great toenails that has been ongoing for the past several years. She has not done anything for treatment and is unable to trim the nails herself. She denies any modifying factors. Patient is here for further evaluation and treatment.   Past Medical History:  Diagnosis Date  . Anemia   . Anxiety    no meds  . Arthritis    hands, knees - no meds  . Bilateral leg weakness   . Dental bridge present    perm lower front bottom bridge  . Depression    no meds  . Eyes swollen    and red - was seen by MD for possible allergy to new eye drops  . GERD (gastroesophageal reflux disease)    diet controlled - only takes med prn basis  . Glaucoma   . Hypercholesteremia   . Hypertension   . SVD (spontaneous vaginal delivery)    x 3 - only one currently living  . Vertigo      Physical Exam: General: The patient is alert and oriented x3 in no acute distress.  Dermatology: Hyperkeratotic, discolored, thickened, onychodystrophy of the bilateral great toenails. Skin is warm, dry and supple bilateral lower extremities. Negative for open lesions or macerations.  Vascular: Palpable pedal pulses bilaterally. No edema or erythema noted. Capillary refill within normal limits.  Neurological: Epicritic and protective threshold grossly intact bilaterally.   Musculoskeletal Exam: Range of motion within normal limits to all pedal and ankle joints bilateral. Muscle strength 5/5 in all groups bilateral.   Assessment: 1. Dystrophic bilateral great toenails    Plan of Care:  1. Patient evaluated.  2. Mechanical debridement of bilateral great toenails performed using a nail nipper. Filed with dremel without incident.  3. Return to clinic in 3 months for routine care.     Edrick Kins, DPM [facility]  Dr. Edrick Kins, DPM    [address], [place] [address]                Office [phone]  Fax [phone]

## 2019-04-11 DIAGNOSIS — H401132 Primary open-angle glaucoma, bilateral, moderate stage: Secondary | ICD-10-CM | POA: Diagnosis not present

## 2019-05-11 ENCOUNTER — Other Ambulatory Visit: Payer: Self-pay

## 2019-05-11 ENCOUNTER — Ambulatory Visit (INDEPENDENT_AMBULATORY_CARE_PROVIDER_SITE_OTHER): Payer: Medicare HMO | Admitting: Podiatry

## 2019-05-25 ENCOUNTER — Ambulatory Visit: Payer: Medicare HMO | Admitting: Podiatry

## 2019-05-27 ENCOUNTER — Other Ambulatory Visit: Payer: Self-pay | Admitting: Family Medicine

## 2019-05-27 DIAGNOSIS — E785 Hyperlipidemia, unspecified: Secondary | ICD-10-CM

## 2019-06-02 ENCOUNTER — Encounter: Payer: Self-pay | Admitting: Nurse Practitioner

## 2019-06-02 ENCOUNTER — Other Ambulatory Visit: Payer: Self-pay

## 2019-06-02 ENCOUNTER — Telehealth: Payer: Self-pay | Admitting: Cardiovascular Disease

## 2019-06-02 ENCOUNTER — Ambulatory Visit (INDEPENDENT_AMBULATORY_CARE_PROVIDER_SITE_OTHER): Payer: Medicare HMO | Admitting: Nurse Practitioner

## 2019-06-02 VITALS — BP 120/66 | HR 96 | Temp 97.1°F | Resp 14 | Ht 63.0 in | Wt 141.3 lb

## 2019-06-02 DIAGNOSIS — R0789 Other chest pain: Secondary | ICD-10-CM | POA: Diagnosis not present

## 2019-06-02 DIAGNOSIS — R9431 Abnormal electrocardiogram [ECG] [EKG]: Secondary | ICD-10-CM

## 2019-06-02 DIAGNOSIS — R531 Weakness: Secondary | ICD-10-CM

## 2019-06-02 DIAGNOSIS — K219 Gastro-esophageal reflux disease without esophagitis: Secondary | ICD-10-CM

## 2019-06-02 MED ORDER — FAMOTIDINE 40 MG PO TABS: 40.0000 mg | ORAL_TABLET | Freq: Every day | ORAL | 0 refills | Status: DC

## 2019-06-02 NOTE — Progress Notes (Signed)
Name: [name]   MRN: [id number]    DOB: 1941-02-01   Date:06/02/2019       Progress Note  Subjective  Chief Complaint  Chief Complaint  Patient presents with  . Fatigue    HPI  Patient states on Tuesday was painting outside and had an episode where she felt a little weak all over, had a short episode of dizziness and nauseated and started to have some central chest pain. States she has vertigo and felt like that's what it was but it went away on its own. She went inside and took her omeprazole and chest pain resolved. States since Tuesday however she has been having intermittent generalized weakness, more often when she stands up. She has been having intermittent central chest pain as well that is resolved with omeprazole but she is taking it sparingly because of concern of alzheimer's with PPI. States central chest pain feels like a soreness. Chest pain is non-radiating typically- yesterday had mild radiation to left chest. She has not had any chest pain today. States pain is not worse with movement or deep breath. Denies heavy lifting.  Denies shortness of breath, cough, fevers, chills, sore throat, vomiting, diarrhea, no blood in stools, or dark tarry stools.   Has been drinking 5-6 glasses of water a day- has increased in the past few days. Drinks 2-3 cups of coffee a day as well. Last alcohol beverage was over week ago.   Lab Results  Component Value Date   CHOL 148 12/30/2018   HDL 67 12/30/2018   LDLCALC 63 12/30/2018   TRIG 94 12/30/2018   CHOLHDL 2.2 12/30/2018     PHQ2/9: Depression screen [facility] 2/9 06/02/2019 12/30/2018 06/29/2018 03/02/2018 02/08/2018  Decreased Interest 0 0 0 0 1  Down, Depressed, Hopeless 0 1 1 0 1  PHQ - 2 Score 0 1 1 0 2  Altered sleeping 0 0 0 0 0  Tired, decreased energy 0 1 2 1 1   Change in appetite 0 1 1 0 0  Feeling bad or failure about yourself  0 1 0 0 0  Trouble concentrating 0 1 0 1 0  Moving slowly or fidgety/restless 0 0 0 0 0   Suicidal thoughts 0 0 0 0 0  PHQ-9 Score 0 5 4 2 3   Difficult doing work/chores Not difficult at all Not difficult at all Not difficult at all Not difficult at all Not difficult at all  Some recent data might be hidden     PHQ reviewed. Negative  Patient Active Problem List   Diagnosis Date Noted  . Senile purpura ([place]) 12/30/2018  . S/P vaginal hysterectomy 05/30/2018  . Recurrent major depressive disorder, in full remission ([place]) 03/02/2018  . Dermatitis 10/20/2016  . GERD (gastroesophageal reflux disease) 10/20/2016  . Anxiety, generalized 01/28/2016  . Osteoarthritis of right knee 12/25/2015  . Depression 09/05/2015  . Abnormal brain CT 06/20/2015  . Anxiety, mild 06/20/2015  . Brain lesion 06/20/2015  . Clinical depression 06/20/2015  . Dyslipidemia 06/20/2015  . Hyperlipidemia LDL goal <100 06/20/2015  . Benign hypertension 06/20/2015  . Affective disorder, major 06/20/2015  . Arthralgia of shoulder 06/20/2015  . Glaucoma 06/20/2015  . Post-menopausal 06/04/2015  . Bilateral leg weakness 06/04/2015    Past Medical History:  Diagnosis Date  . Anemia   . Anxiety    no meds  . Arthritis    hands, knees - no meds  . Bilateral leg weakness   . Dental bridge present  perm lower front bottom bridge  . Depression    no meds  . Eyes swollen    and red - was seen by MD for possible allergy to new eye drops  . GERD (gastroesophageal reflux disease)    diet controlled - only takes med prn basis  . Glaucoma   . Hypercholesteremia   . Hypertension   . SVD (spontaneous vaginal delivery)    x 3 - only one currently living  . Vertigo     Past Surgical History:  Procedure Laterality Date  . ANAL FISSURE REPAIR  1970s   for bleeding in 1970s  . ANTERIOR AND POSTERIOR REPAIR WITH SACROSPINOUS FIXATION N/A 05/30/2018   Procedure: ANTERIOR AND POSTERIOR REPAIR;  Surgeon: Louretta Shorten, MD;  Location: [facility];  Service: Gynecology;  Laterality: N/A;  . COLONOSCOPY  2012    [place]  . COLONOSCOPY WITH PROPOFOL N/A 09/01/2016   Procedure: COLONOSCOPY WITH PROPOFOL;  Surgeon: Robert Bellow, MD;  Location: [facility] END[facility]COPY;  Service: Endoscopy;  Laterality: N/A;  . EYE SURGERY Bilateral    cataracts removed  . KNEE SURGERY Left   . SALPINGOOPHORECTOMY Right 05/30/2018   Procedure: SALPINGO OOPHORECTOMY;  Surgeon: Louretta Shorten, MD;  Location: [facility];  Service: Gynecology;  Laterality: Right;  . SUBCLAVIAN ANGIOGRAM    . UPPER GI ENDOSCOPY  2012  . VAGINAL HYSTERECTOMY N/A 05/30/2018   Procedure: HYSTERECTOMY VAGINAL;  Surgeon: Louretta Shorten, MD;  Location: [facility];  Service: Gynecology;  Laterality: N/A;  . WISDOM TOOTH EXTRACTION      Social History   Tobacco Use  . Smoking status: Never Smoker  . Smokeless tobacco: Never Used  . Tobacco comment: smoking cessation materials not required  Substance Use Topics  . Alcohol use: Yes    Alcohol/week: 0.0 standard drinks    Comment: occasional beer/mixed drink     Current Outpatient Medications:  .  ALPHAGAN P 0.1 % SOLN, Place 1 drop into both eyes 3 (three) times daily. , Disp: , Rfl:  .  bimatoprost (LUMIGAN) 0.01 % SOLN, Place 1 drop into both eyes at bedtime., Disp: , Rfl:  .  Biotin 10000 MCG TABS, Take 1 tablet by mouth at bedtime., Disp: , Rfl:  .  ibuprofen (ADVIL,MOTRIN) 200 MG tablet, Take 400 mg by mouth daily as needed for moderate pain. , Disp: , Rfl:  .  losartan (COZAAR) 50 MG tablet, Take 1 tablet (50 mg total) by mouth daily., Disp: 30 tablet, Rfl: 5 .  mirabegron ER (MYRBETRIQ) 25 MG TB24 tablet, Take 25 mg by mouth daily., Disp: , Rfl:  .  omeprazole (PRILOSEC) 20 MG capsule, Take 1 capsule (20 mg total) by mouth daily. (Patient taking differently: Take 20 mg by mouth daily as needed (acid refelux). ), Disp: 90 capsule, Rfl: 0 .  Polyethyl Glycol-Propyl Glycol (SYSTANE OP), Place 1 drop into both eyes as needed (dry eyes)., Disp: , Rfl:  .  pravastatin (PRAVACHOL) 20 MG tablet, TAKE 1  TABLET BY MOUTH EVERY DAY IN THE EVENING, Disp: 90 tablet, Rfl: 0 .  PREMARIN vaginal cream, Place 1 application vaginally 2 (two) times a week., Disp: , Rfl: 12 .  VITAMIN E PO, Take 1 capsule by mouth daily., Disp: , Rfl:  .  White Petrolatum-Mineral Oil (SYSTANE NIGHTTIME) OINT, Place 1 application into both eyes at bedtime as needed (dry eyes)., Disp: , Rfl:  .  Flaxseed, Linseed, (FLAX SEED OIL) 1000 MG CAPS, Take 1,000 mg by mouth daily., Disp: ,  Rfl:  .  fluticasone (FLONASE) 50 MCG/ACT nasal spray, Place 2 sprays into both nostrils daily. (Patient not taking: Reported on 06/02/2019), Disp: 16 g, Rfl: 0 .  OVER THE COUNTER MEDICATION, Take 1 tablet by mouth daily. Steel Libido - otc supplement, Disp: , Rfl:   Allergies  Allergen Reactions  . Codeine     headaches  . Combigan [Brimonidine Tartrate-Timolol]     drowsy  . Sulfa Antibiotics Nausea And Vomiting    Review of Systems  Constitutional: Negative for chills, fever and malaise/fatigue.  HENT: Negative for congestion, sinus pain and sore throat.   Eyes: Negative for blurred vision.  Respiratory: Negative for cough and shortness of breath.   Cardiovascular: Positive for chest pain. Negative for palpitations and leg swelling.  Gastrointestinal: Negative for abdominal pain, blood in stool, constipation, diarrhea and nausea.  Genitourinary: Negative for dysuria.  Musculoskeletal: Negative for falls.  Skin: Negative for rash.  Neurological: Positive for weakness. Negative for dizziness, tingling, sensory change, speech change, focal weakness, loss of consciousness and headaches.  Endo/Heme/Allergies: Negative for polydipsia.     No other specific complaints in a complete review of systems (except as listed in HPI above).  Objective  Vitals:   06/02/19 1135  BP: 120/66  Pulse: 96  Resp: 14  Temp: (!) 97.1 F (36.2 C)  TempSrc: Temporal  SpO2: 97%  Weight: 141 lb 4.8 oz (64.1 kg)  Height: 5\' 3"  (1.6 m)   Orthostatic  VS for the past 24 hrs:  BP- Lying Pulse- Lying BP- Sitting Pulse- Sitting BP- Standing at 0 minutes Pulse- Standing at 0 minutes  06/02/19 1208 110/62 76 110/66 78 110/68 85      Body mass index is 25.03 kg/m.  Nursing Note and Vital Signs reviewed.   Physical Exam Vitals signs reviewed.  Constitutional:      Appearance: She is well-developed.  HENT:     Head: Normocephalic and atraumatic.  Neck:     Musculoskeletal: Normal range of motion and neck supple.     Vascular: No carotid bruit.  Cardiovascular:     Rate and Rhythm: Normal rate and regular rhythm.     Pulses:          Radial pulses are 3+ on the right side and 3+ on the left side.       Posterior tibial pulses are 3+ on the right side and 3+ on the left side.     Heart sounds: Normal heart sounds. No murmur. No gallop.   Pulmonary:     Effort: Pulmonary effort is normal.     Breath sounds: Normal breath sounds.  Chest:     Chest wall: No swelling or tenderness.  Abdominal:     General: Bowel sounds are normal.     Palpations: Abdomen is soft.     Tenderness: There is no abdominal tenderness.  Musculoskeletal: Normal range of motion.     Right lower leg: No edema.     Left lower leg: No edema.  Skin:    General: Skin is warm and dry.     Capillary Refill: Capillary refill takes less than 2 seconds.  Neurological:     Mental Status: She is alert and oriented to person, place, and time.     GCS: GCS eye subscore is 4. GCS verbal subscore is 5. GCS motor subscore is 6.     Sensory: No sensory deficit.  Psychiatric:        Speech: Speech normal.  Behavior: Behavior normal.        Thought Content: Thought content normal.        Judgment: Judgment normal.      No results found for this or any previous visit (from the past 48 hour(s)).  Assessment & Plan 1. Generalized weakness Discussed hydration and nutrition and slow transitions, consider reducing losartan if continued.  - EKG 12-Lead -  Orthostatic vital signs - CBC with Differential - Basic Metabolic Panel (BMET) - TSH  2. Other chest pain Discussed going to ER if pain returns, she is presently asymptomatic. Will start nightly famotidine and PPI prn.  - EKG 12-Lead: sinus rhythm with possible q wave only in AVF-no contiguous leads, no ectopy.  - Orthostatic vital signs - CBC with Differential - Basic Metabolic Panel (BMET) - Ambulatory referral to Cardiology  3. Gastroesophageal reflux disease without esophagitis - famotidine (PEPCID) 40 MG tablet; Take 1 tablet (40 mg total) by mouth at bedtime.  Dispense: 30 tablet; Refill: 0  4. Abnormal EKG - Ambulatory referral to Cardiology    -Red flags and when to present for emergency care or RTC including fever >101.95F, chest pain, shortness of breath, new/worsening/un-resolving symptoms, reviewed with patient at time of visit. Follow up and care instructions discussed and provided in AVS.

## 2019-06-02 NOTE — Patient Instructions (Addendum)
- Drink at least 60 ounces of water a day - If you have another episode of chest pain and/or severe weakness please call 911 to be immediately evaluated.  - If you have not heard anything from my staff in a week about any orders/referrals/studies from today, please contact us here to follow-up [phone]   Orthostatic Hypotension Blood pressure is a measurement of how strongly, or weakly, your blood is pressing against the walls of your arteries. Orthostatic hypotension is a sudden drop in blood pressure that happens when you quickly change positions, such as when you get up from sitting or lying down. Arteries are blood vessels that carry blood from your heart throughout your body. When blood pressure is too low, you may not get enough blood to your brain or to the rest of your organs. This can cause weakness, light-headedness, rapid heartbeat, and fainting. This can last for just a few seconds or for up to a few minutes. Orthostatic hypotension is usually not a serious problem. However, if it happens frequently or gets worse, it may be a sign of something more serious. What are the causes? This condition may be caused by:  Sudden changes in posture, such as standing up quickly after you have been sitting or lying down.  Blood loss.  Loss of body fluids (dehydration).  Heart problems.  Hormone (endocrine) problems.  Pregnancy.  Severe infection.  Lack of certain nutrients.  Severe allergic reactions (anaphylaxis).  Certain medicines, such as blood pressure medicine or medicines that make the body lose excess fluids (diuretics). Sometimes, this condition can be caused by not taking medicine as directed, such as taking too much of a certain medicine. What increases the risk? The following factors may make you more likely to develop this condition:  Age. Risk increases as you get older.  Conditions that affect the heart or the central nervous system.  Taking certain medicines,  such as blood pressure medicine or diuretics.  Being pregnant. What are the signs or symptoms? Symptoms of this condition may include:  Weakness.  Light-headedness.  Dizziness.  Blurred vision.  Fatigue.  Rapid heartbeat.  Fainting, in severe cases. How is this diagnosed? This condition is diagnosed based on:  Your medical history.  Your symptoms.  Your blood pressure measurement. Your health care provider will check your blood pressure when you are: ? Lying down. ? Sitting. ? Standing. A blood pressure reading is recorded as two numbers, such as "120 over 80" (or 120/80). The first ("top") number is called the systolic pressure. It is a measure of the pressure in your arteries as your heart beats. The second ("bottom") number is called the diastolic pressure. It is a measure of the pressure in your arteries when your heart relaxes between beats. Blood pressure is measured in a unit called mm Hg. Healthy blood pressure for most adults is 120/80. If your blood pressure is below 90/60, you may be diagnosed with hypotension. Other information or tests that may be used to diagnose orthostatic hypotension include:  Your other vital signs, such as your heart rate and temperature.  Blood tests.  Tilt table test. For this test, you will be safely secured to a table that moves you from a lying position to an upright position. Your heart rhythm and blood pressure will be monitored during the test. How is this treated? This condition may be treated by:  Changing your diet. This may involve eating more salt (sodium) or drinking more water.  Taking medicines  to raise your blood pressure.  Changing the dosage of certain medicines you are taking that might be lowering your blood pressure.  Wearing compression stockings. These stockings help to prevent blood clots and reduce swelling in your legs. In some cases, you may need to go to the hospital for:  Fluid replacement. This means  you will receive fluids through an IV.  Blood replacement. This means you will receive donated blood through an IV (transfusion).  Treating an infection or heart problems, if this applies.  Monitoring. You may need to be monitored while medicines that you are taking wear off. Follow these instructions at home: Eating and drinking   Drink enough fluid to keep your urine pale yellow.  Eat a healthy diet, and follow instructions from your health care provider about eating or drinking restrictions. A healthy diet includes: ? Fresh fruits and vegetables. ? Whole grains. ? Lean meats. ? Low-fat dairy products.  Eat extra salt only as directed. Do not add extra salt to your diet unless your health care provider told you to do that.  Eat frequent, small meals.  Avoid standing up suddenly after eating. Medicines  Take over-the-counter and prescription medicines only as told by your health care provider. ? Follow instructions from your health care provider about changing the dosage of your current medicines, if this applies. ? Do not stop or adjust any of your medicines on your own. General instructions   Wear compression stockings as told by your health care provider.  Get up slowly from lying down or sitting positions. This gives your blood pressure a chance to adjust.  Avoid hot showers and excessive heat as directed by your health care provider.  Return to your normal activities as told by your health care provider. Ask your health care provider what activities are safe for you.  Do not use any products that contain nicotine or tobacco, such as cigarettes, e-cigarettes, and chewing tobacco. If you need help quitting, ask your health care provider.  Keep all follow-up visits as told by your health care provider. This is important. Contact a health care provider if you:  Vomit.  Have diarrhea.  Have a fever for more than 2-3 days.  Feel more thirsty than usual.  Feel weak  and tired. Get help right away if you:  Have chest pain.  Have a fast or irregular heartbeat.  Develop numbness in any part of your body.  Cannot move your arms or your legs.  Have trouble speaking.  Become sweaty or feel light-headed.  Faint.  Feel short of breath.  Have trouble staying awake.  Feel confused. Summary  Orthostatic hypotension is a sudden drop in blood pressure that happens when you quickly change positions.  Orthostatic hypotension is usually not a serious problem.  It is diagnosed by having your blood pressure taken lying down, sitting, and then standing.  It may be treated by changing your diet or adjusting your medicines. This information is not intended to replace advice given to you by your health care provider. Make sure you discuss any questions you have with your health care provider. Document Released: 10/23/2002 Document Revised: 04/28/2018 Document Reviewed: 04/28/2018 Elsevier Patient Education  2020 [place] a health care provider if:  Your chest pain does not go away.  You feel depressed.  You have a fever. Get help right away if:  Your chest pain gets worse.  You have a cough that gets worse, or you cough up blood.  You have severe pain in your abdomen.  You faint.  You have sudden, unexplained chest discomfort.  You have sudden, unexplained discomfort in your arms, back, neck, or jaw.  You have shortness of breath at any time.  You suddenly start to sweat, or your skin gets clammy.  You feel nausea or you vomit.  You suddenly feel lightheaded or dizzy.  You have severe weakness, or unexplained weakness or fatigue.  Your heart begins to beat quickly, or it feels like it is skipping beats. These symptoms may represent a serious problem that is an emergency. Do not wait to see if the symptoms will go away. Get medical help right away. Call your local emergency services (911 in the [place] Do not drive  yourself to the hospital. Summary  Chest pain can be caused by a condition that is serious and requires urgent treatment. It may also be caused by something that is not life-threatening.  If you have chest pain, it is very important to see your health care provider. Your health care provider may do lab tests and other studies to find the cause of your pain.  Follow your health care provider's instructions on taking medicines, making lifestyle changes, and getting emergency treatment if symptoms become worse.  Keep all follow-up visits as told by your health care provider. This includes visits for any further testing if your chest pain does not go away. This information is not intended to replace advice given to you by your health care provider. Make sure you discuss any questions you have with your health care provider. Document Released: 08/12/2005 Document Revised: 05/05/2018 Document Reviewed: 05/05/2018 Elsevier Patient Education  2020 [employer].

## 2019-06-02 NOTE — Telephone Encounter (Signed)

## 2019-06-02 NOTE — Progress Notes (Signed)
Cardiology Office Note  Date:  06/05/2019   ID:  [name], DOB December 02, 1940, MRN [id number]  PCP:  Steele Sizer, MD   Chief Complaint  Patient presents with  . office visit    NEW PATIENT-chest pain    HPI:  [name] is a 78 year old woman with past medical history of Vertigo Referred by Suezanne Cheshire for intermittent chest pain and weakness  Reports that last week she was painting outside episode where she felt a little weak all over,  had a  episode of dizziness and nauseated  started to have some central chest pain.  Lasted all day, moved central to left side  Vertigo, but it went away on its own.  She went inside and took her omeprazole and chest pain resolved.  States since Tuesday however she has been having intermittent generalized weakness, more often when she stands up.   Dizzy when bending over  intermittent central chest pain , resolved with omeprazole  chest pain feels like a soreness, non-radiating typically  yesterday had mild radiation to left chest.   Having weak spells,  Worse standing up  Chronic leg weakness  Drinking 60oz a day  TSH 3 Total chol 143, LDL 63 HBA1c 5.3  EKG personally reviewed by myself on todays visit Shows NSR rate 71 bpm   Dad 27 yo, renal failure Mom dementia 61    PMH:   has a past medical history of Anemia, Anxiety, Arthritis, Bilateral leg weakness, Dental bridge present, Depression, Eyes swollen, GERD (gastroesophageal reflux disease), Glaucoma, Hypercholesteremia, Hypertension, SVD (spontaneous vaginal delivery), and Vertigo.  PSH:    Past Surgical History:  Procedure Laterality Date  . ANAL FISSURE REPAIR  1970s   for bleeding in 1970s  . ANTERIOR AND POSTERIOR REPAIR WITH SACROSPINOUS FIXATION N/A 05/30/2018   Procedure: ANTERIOR AND POSTERIOR REPAIR;  Surgeon: Louretta Shorten, MD;  Location: [facility];  Service: Gynecology;  Laterality: N/A;  . COLONOSCOPY  2012   [place]  . COLONOSCOPY  WITH PROPOFOL N/A 09/01/2016   Procedure: COLONOSCOPY WITH PROPOFOL;  Surgeon: Robert Bellow, MD;  Location: [facility] END[facility]COPY;  Service: Endoscopy;  Laterality: N/A;  . EYE SURGERY Bilateral    cataracts removed  . KNEE SURGERY Left   . SALPINGOOPHORECTOMY Right 05/30/2018   Procedure: SALPINGO OOPHORECTOMY;  Surgeon: Louretta Shorten, MD;  Location: [facility];  Service: Gynecology;  Laterality: Right;  . SUBCLAVIAN ANGIOGRAM    . UPPER GI ENDOSCOPY  2012  . VAGINAL HYSTERECTOMY N/A 05/30/2018   Procedure: HYSTERECTOMY VAGINAL;  Surgeon: Louretta Shorten, MD;  Location: [facility];  Service: Gynecology;  Laterality: N/A;  . WISDOM TOOTH EXTRACTION      Current Outpatient Medications  Medication Sig Dispense Refill  . ALPHAGAN P 0.1 % SOLN Place 1 drop into both eyes 3 (three) times daily.     . bimatoprost (LUMIGAN) 0.01 % SOLN Place 1 drop into both eyes at bedtime.    . Biotin 10000 MCG TABS Take 1 tablet by mouth at bedtime.    . famotidine (PEPCID) 40 MG tablet Take 1 tablet (40 mg total) by mouth at bedtime. 30 tablet 0  . Flaxseed, Linseed, (FLAX SEED OIL) 1000 MG CAPS Take 1,000 mg by mouth daily.    Marland Kitchen ibuprofen (ADVIL,MOTRIN) 200 MG tablet Take 400 mg by mouth daily as needed for moderate pain.     Marland Kitchen losartan (COZAAR) 50 MG tablet Take 1 tablet (50 mg total) by mouth daily. 30 tablet 5  .  omeprazole (PRILOSEC) 20 MG capsule Take 1 capsule (20 mg total) by mouth daily. (Patient taking differently: Take 20 mg by mouth daily as needed (acid refelux). ) 90 capsule 0  . OVER THE COUNTER MEDICATION Take 1 tablet by mouth daily. Steel Libido - otc supplement    . Polyethyl Glycol-Propyl Glycol (SYSTANE OP) Place 1 drop into both eyes as needed (dry eyes).    . pravastatin (PRAVACHOL) 20 MG tablet TAKE 1 TABLET BY MOUTH EVERY DAY IN THE EVENING 90 tablet 0  . PREMARIN vaginal cream Place 1 application vaginally 2 (two) times a week.  12  . White Petrolatum-Mineral Oil (SYSTANE NIGHTTIME) OINT Place 1  application into both eyes at bedtime as needed (dry eyes).     No current facility-administered medications for this visit.      Allergies:   Codeine, Combigan [brimonidine tartrate-timolol], and Sulfa antibiotics   Social History:  The patient  reports that she has never smoked. She has never used smokeless tobacco. She reports current alcohol use. She reports that she does not use drugs.   Family History:   family history includes Alzheimer's disease in her mother; Cancer in her brother and brother; Cancer (age of onset: 62) in her son; Cancer (age of onset: 79) in her mother; Cerebral aneurysm in her father; Depression in her sister and sister; Diabetes in her sister; Gallbladder disease in her brother; Hypertension in her father, mother, and sister; Lymphoma in her son; Prostate cancer in her brother.    Review of Systems: Review of Systems  Constitutional: Negative.   HENT: Negative.   Respiratory: Negative.   Cardiovascular: Positive for chest pain.  Gastrointestinal: Negative.   Musculoskeletal: Negative.   Neurological: Positive for dizziness.  Psychiatric/Behavioral: Negative.   All other systems reviewed and are negative.    PHYSICAL EXAM: VS:  BP 118/68 (BP Location: Right Arm, Patient Position: Sitting, Cuff Size: Normal)   Pulse 71   Temp 98.6 F (37 C)   Ht 5\' 2"  (1.575 m)   Wt 142 lb (64.4 kg)   SpO2 95%   BMI 25.97 kg/m  , BMI Body mass index is 25.97 kg/m. GEN: Well nourished, well developed, in no acute distress HEENT: normal Neck: no JVD, carotid bruits, or masses Cardiac: RRR; no murmurs, rubs, or gallops,no edema  Respiratory:  clear to auscultation bilaterally, normal work of breathing GI: soft, nontender, nondistended, + BS MS: no deformity or atrophy Skin: warm and dry, no rash Neuro:  Strength and sensation are intact Psych: euthymic mood, full affect   Recent Labs: 12/30/2018: ALT 16 06/02/2019: BUN 12; Creat 0.74; Hemoglobin 13.1;  Platelets 208; Potassium 4.2; Sodium 142; TSH 3.24    Lipid Panel Lab Results  Component Value Date   CHOL 148 12/30/2018   HDL 67 12/30/2018   LDLCALC 63 12/30/2018   TRIG 94 12/30/2018      Wt Readings from Last 3 Encounters:  06/05/19 142 lb (64.4 kg)  06/02/19 141 lb 4.8 oz (64.1 kg)  12/30/18 131 lb 11.2 oz (59.7 kg)        ASSESSMENT AND PLAN:  Problem List Items Addressed This Visit      Cardiology Problems   Hyperlipidemia LDL goal <100   Benign hypertension     Other   GERD (gastroesophageal reflux disease)   Bilateral leg weakness    Other Visit Diagnoses    Chest pain of uncertain etiology    -  Primary     Chest pain  symptoms atypical in nature Present at rest, was worse after painting Long discussion with her concerning anginal symptoms, we will hold off on stress testing at this time -EKG on today's visit relatively benign Low risk factors, no smoking no diabetes cholesterol well controlled She will call us if she develops anginal symptoms At that time pharmacologic Myoview could be ordered  Dizziness Recommend she continue to hold losartan, she has been off for 2 days Blood pressure running low today 259 systolic without any pill This could explain some of her general fatigue, malaise, dizziness/orthostasis  GERD On PPI  Hyperlipidemia Numbers well controlled  Disposition:   F/U as needed   Total encounter time more than 45 minutes  Greater than 50% was spent in counseling and coordination of care with the patient    Signed, Esmond Plants, M.D., Ph.D. [place], [place]

## 2019-06-03 LAB — TSH: TSH: 3.24 mIU/L (ref 0.40–4.50)

## 2019-06-03 LAB — CBC WITH DIFFERENTIAL/PLATELET
Absolute Monocytes: 360 cells/uL (ref 200–950)
Basophils Absolute: 30 cells/uL (ref 0–200)
Basophils Relative: 0.5 %
Eosinophils Absolute: 159 cells/uL (ref 15–500)
Eosinophils Relative: 2.7 %
HCT: 38.5 % (ref 35.0–45.0)
Hemoglobin: 13.1 g/dL (ref 11.7–15.5)
Lymphs Abs: 2171 cells/uL (ref 850–3900)
MCH: 30 pg (ref 27.0–33.0)
MCHC: 34 g/dL (ref 32.0–36.0)
MCV: 88.1 fL (ref 80.0–100.0)
MPV: 11.9 fL (ref 7.5–12.5)
Monocytes Relative: 6.1 %
Neutro Abs: 3180 cells/uL (ref 1500–7800)
Neutrophils Relative %: 53.9 %
Platelets: 208 10*3/uL (ref 140–400)
RBC: 4.37 10*6/uL (ref 3.80–5.10)
RDW: 13 % (ref 11.0–15.0)
Total Lymphocyte: 36.8 %
WBC: 5.9 10*3/uL (ref 3.8–10.8)

## 2019-06-03 LAB — BASIC METABOLIC PANEL
BUN: 12 mg/dL (ref 7–25)
CO2: 26 mmol/L (ref 20–32)
Calcium: 9.2 mg/dL (ref 8.6–10.4)
Chloride: 108 mmol/L (ref 98–110)
Creat: 0.74 mg/dL (ref 0.60–0.93)
Glucose, Bld: 76 mg/dL (ref 65–99)
Potassium: 4.2 mmol/L (ref 3.5–5.3)
Sodium: 142 mmol/L (ref 135–146)

## 2019-06-05 ENCOUNTER — Other Ambulatory Visit: Payer: Self-pay

## 2019-06-05 ENCOUNTER — Ambulatory Visit (INDEPENDENT_AMBULATORY_CARE_PROVIDER_SITE_OTHER): Payer: Medicare HMO | Admitting: Cardiovascular Disease

## 2019-06-05 ENCOUNTER — Encounter: Payer: Self-pay | Admitting: Cardiovascular Disease

## 2019-06-05 VITALS — BP 118/68 | HR 71 | Temp 98.6°F | Ht 62.0 in | Wt 142.0 lb

## 2019-06-05 DIAGNOSIS — R29898 Other symptoms and signs involving the musculoskeletal system: Secondary | ICD-10-CM

## 2019-06-05 DIAGNOSIS — K219 Gastro-esophageal reflux disease without esophagitis: Secondary | ICD-10-CM

## 2019-06-05 DIAGNOSIS — R0789 Other chest pain: Secondary | ICD-10-CM

## 2019-06-05 DIAGNOSIS — E785 Hyperlipidemia, unspecified: Secondary | ICD-10-CM

## 2019-06-05 DIAGNOSIS — I10 Essential (primary) hypertension: Secondary | ICD-10-CM | POA: Diagnosis not present

## 2019-06-05 DIAGNOSIS — R079 Chest pain, unspecified: Secondary | ICD-10-CM

## 2019-06-05 NOTE — Patient Instructions (Addendum)
Medication Instructions:  - Stop losartan  If you need a refill on your cardiac medications before your next appointment, please call your pharmacy.    Lab work: No new labs needed   If you have labs (blood work) drawn today and your tests are completely normal, you will receive your results only by: Marland Kitchen MyChart Message (if you have MyChart) OR . A paper copy in the mail If you have any lab test that is abnormal or we need to change your treatment, we will call you to review the results.   Testing/Procedures: No new testing needed   Follow-Up: At [facility], you and your health needs are our priority.  As part of our continuing mission to provide you with exceptional heart care, we have created designated Provider Care Teams.  These Care Teams include your primary Cardiologist (physician) and Advanced Practice Providers (APPs -  Physician Assistants and Nurse Practitioners) who all work together to provide you with the care you need, when you need it.  . You will need a follow up appointment as needed  . Providers on your designated Care Team:   . Murray Hodgkins, NP . Christell Faith, PA-C . Marrianne Mood, PA-C  Any Other Special Instructions Will Be Listed Below (If Applicable).  For educational health videos Log in to : [email or web address] Or : [email or web address], password : [email or web address]

## 2019-06-07 ENCOUNTER — Telehealth: Payer: Self-pay | Admitting: Family Medicine

## 2019-06-07 NOTE — Telephone Encounter (Signed)
Pt is calling her bp on 06/05/2019  Was 118/68 at heart doctor office and he took her off losartan. Pt is still having weakness today the patient is better. Pt was anemia in past and she is wondering if she should take b12 injection for energy. Please advice . [facility] [place]. Pt saw elizabeth NP on 06/02/2019. Pt said her heart is that of 78 years old per cardiologist

## 2019-06-08 ENCOUNTER — Other Ambulatory Visit: Payer: Self-pay | Admitting: Family Medicine

## 2019-06-08 DIAGNOSIS — Z1231 Encounter for screening mammogram for malignant neoplasm of breast: Secondary | ICD-10-CM

## 2019-06-30 ENCOUNTER — Ambulatory Visit (INDEPENDENT_AMBULATORY_CARE_PROVIDER_SITE_OTHER): Payer: Medicare HMO | Admitting: Family Medicine

## 2019-06-30 ENCOUNTER — Encounter: Payer: Self-pay | Admitting: Family Medicine

## 2019-06-30 ENCOUNTER — Other Ambulatory Visit: Payer: Self-pay | Admitting: Nurse Practitioner

## 2019-06-30 ENCOUNTER — Other Ambulatory Visit: Payer: Self-pay

## 2019-06-30 VITALS — BP 140/80 | HR 69 | Temp 97.3°F | Resp 16 | Ht 62.0 in | Wt 142.5 lb

## 2019-06-30 DIAGNOSIS — Z8639 Personal history of other endocrine, nutritional and metabolic disease: Secondary | ICD-10-CM

## 2019-06-30 DIAGNOSIS — K219 Gastro-esophageal reflux disease without esophagitis: Secondary | ICD-10-CM

## 2019-06-30 DIAGNOSIS — I10 Essential (primary) hypertension: Secondary | ICD-10-CM

## 2019-06-30 DIAGNOSIS — R739 Hyperglycemia, unspecified: Secondary | ICD-10-CM | POA: Diagnosis not present

## 2019-06-30 DIAGNOSIS — R42 Dizziness and giddiness: Secondary | ICD-10-CM | POA: Diagnosis not present

## 2019-06-30 DIAGNOSIS — E785 Hyperlipidemia, unspecified: Secondary | ICD-10-CM

## 2019-06-30 MED ORDER — PRAVASTATIN SODIUM 20 MG PO TABS: 20.0000 mg | ORAL_TABLET | Freq: Every day | ORAL | 1 refills | Status: DC

## 2019-06-30 MED ORDER — LOSARTAN POTASSIUM 25 MG PO TABS: 25.0000 mg | ORAL_TABLET | Freq: Every day | ORAL | 0 refills | Status: DC

## 2019-06-30 MED ORDER — MECLIZINE HCL 12.5 MG PO TABS: 12.5000 mg | ORAL_TABLET | Freq: Three times a day (TID) | ORAL | 0 refills | Status: DC | PRN

## 2019-06-30 MED ORDER — FAMOTIDINE 40 MG PO TABS: 40.0000 mg | ORAL_TABLET | Freq: Every day | ORAL | 0 refills | Status: DC | PRN

## 2019-06-30 NOTE — Progress Notes (Signed)
Name: [name]   MRN: [id number]    DOB: 1940/12/02   Date:06/30/2019       Progress Note  Subjective  Chief Complaint  Chief Complaint  Patient presents with  . Depression  . Dyslipidemia  . Gastroesophageal Reflux  . Hypertension  . Hyperlipidemia    HPI  OAright knee: going on for years, used to see Dr. Marlou Sa and has a history of left knee meniscal tear repair, she saw Dr. Marlou Sa 07/2018 and has severe OA, pain is not constant, aggravated by activity but able to tolerate and she does not have surgery. Seeing Chriopractor - Dr. Well and is doing better with accupuncture. Symptoms are stable, taking Tylenol prn   HTN: she has been off losartan for the past month, she had been out of medication for two days when she went to see Dr. Rockey Situ for evaluation of chest pain and low bp and was advised to be off losartan, however bp is elevated again today, we will resume losartan at 25 mg and ask her to return in one week for bp check only.  She felt dizzy about one month ago while in the heat painting a building/shed   Hyperlipidemia: taking pravastatin and denies side effects of medication, including no myalgia. Last LDL was at goal at 74, continue medication  Depression Major Chronic: she states doing well, still misses her son , that died at age 13 from [place] back in 2013/03/06. Her husband died 4 years before , on the same day on October 14 th. She has two grandchildren from him and two other grandchildren from her youngest son. She states she still misses her son but states she keeps moving on , does not want medication  GERD: she has stopped omeprazole but is now on Pepcid prn for the past month and only had to take it once   Hyperglycemia: A1C was 5.8% but last visit was 5.3% we will recheck today Denies polyphagia, polydipsia or polyuria. We will recheck labs  Episode of chest pain: seen by Dr. Rockey Situ negative evaluation doing well since.   Patient Active Problem List   Diagnosis Date Noted  . Senile purpura ([place]) 12/30/2018  . S/P vaginal hysterectomy 05/30/2018  . Recurrent major depressive disorder, in full remission ([place]) 03/02/2018  . Dermatitis 10/20/2016  . GERD (gastroesophageal reflux disease) 10/20/2016  . Anxiety, generalized 01/28/2016  . Osteoarthritis of right knee 12/25/2015  . Depression 09/05/2015  . Abnormal brain CT 06/20/2015  . Anxiety, mild 06/20/2015  . Brain lesion 06/20/2015  . Clinical depression 06/20/2015  . Dyslipidemia 06/20/2015  . Hyperlipidemia LDL goal <100 06/20/2015  . Benign hypertension 06/20/2015  . Affective disorder, major 06/20/2015  . Arthralgia of shoulder 06/20/2015  . Glaucoma 06/20/2015  . Post-menopausal 06/04/2015  . Bilateral leg weakness 06/04/2015    Past Surgical History:  Procedure Laterality Date  . ANAL FISSURE REPAIR  1970s   for bleeding in 1970s  . ANTERIOR AND POSTERIOR REPAIR WITH SACROSPINOUS FIXATION N/A 05/30/2018   Procedure: ANTERIOR AND POSTERIOR REPAIR;  Surgeon: Louretta Shorten, MD;  Location: [facility];  Service: Gynecology;  Laterality: N/A;  . COLONOSCOPY  2011/03/07   [place]  . COLONOSCOPY WITH PROPOFOL N/A 09/01/2016   Procedure: COLONOSCOPY WITH PROPOFOL;  Surgeon: Robert Bellow, MD;  Location: [facility] ENDOSCOPY;  Service: Endoscopy;  Laterality: N/A;  . EYE SURGERY Bilateral    cataracts removed  . KNEE SURGERY Left   . SALPINGOOPHORECTOMY Right 05/30/2018   Procedure: SALPINGO  OOPHORECTOMY;  Surgeon: Louretta Shorten, MD;  Location: [facility];  Service: Gynecology;  Laterality: Right;  . SUBCLAVIAN ANGIOGRAM    . UPPER GI ENDOSCOPY  2012  . VAGINAL HYSTERECTOMY N/A 05/30/2018   Procedure: HYSTERECTOMY VAGINAL;  Surgeon: Louretta Shorten, MD;  Location: [facility];  Service: Gynecology;  Laterality: N/A;  . WISDOM TOOTH EXTRACTION      Family History  Problem Relation Age of Onset  . Cancer Mother 63       colon  . Alzheimer's disease Mother   . Hypertension Mother   . Cancer Brother         prostate  . Lymphoma Son   . Cancer Son 65       lymphoma  . Hypertension Father   . Cerebral aneurysm Father   . Diabetes Sister   . Depression Sister   . Hypertension Sister   . Depression Sister   . Cancer Brother   . Gallbladder disease Brother   . Prostate cancer Brother   . Breast cancer Neg Hx     Social History   Socioeconomic History  . Marital status: Widowed    Spouse name: [name]  . Number of children: 3  . Years of education: Not on file  . Highest education level: 12th grade  Occupational History    Employer: [employer]  Social Needs  . Financial resource strain: Not hard at all  . Food insecurity    Worry: Never true    Inability: Never true  . Transportation needs    Medical: No    Non-medical: No  Tobacco Use  . Smoking status: Never Smoker  . Smokeless tobacco: Never Used  . Tobacco comment: smoking cessation materials not required  Substance and Sexual Activity  . Alcohol use: Yes    Alcohol/week: 0.0 standard drinks    Comment: occasional beer/mixed drink  . Drug use: No  . Sexual activity: Yes    Partners: Male    Birth control/protection: Post-menopausal  Lifestyle  . Physical activity    Days per week: 3 days    Minutes per session: 60 min  . Stress: Only a little  Relationships  . Social Herbalist on phone: Patient refused    Gets together: Patient refused    Attends religious service: Patient refused    Active member of club or organization: Patient refused    Attends meetings of clubs or organizations: Patient refused    Relationship status: Widowed  . Intimate partner violence    Fear of current or ex partner: No    Emotionally abused: No    Physically abused: No    Forced sexual activity: No  Other Topics Concern  . Not on file  Social History Narrative  . Not on file     Current Outpatient Medications:  .  ALPHAGAN P 0.1 % SOLN, Place 1 drop into both eyes 3 (three) times daily. , Disp: , Rfl:  .   bimatoprost (LUMIGAN) 0.01 % SOLN, Place 1 drop into both eyes at bedtime., Disp: , Rfl:  .  Biotin 10000 MCG TABS, Take 1 tablet by mouth at bedtime., Disp: , Rfl:  .  famotidine (PEPCID) 40 MG tablet, Take 1 tablet (40 mg total) by mouth daily as needed for heartburn or indigestion., Disp: 90 tablet, Rfl: 0 .  Flaxseed, Linseed, (FLAX SEED OIL) 1000 MG CAPS, Take 1,000 mg by mouth daily., Disp: , Rfl:  .  OVER THE COUNTER MEDICATION, Take  1 tablet by mouth daily. Steel Libido - otc supplement, Disp: , Rfl:  .  Polyethyl Glycol-Propyl Glycol (SYSTANE OP), Place 1 drop into both eyes as needed (dry eyes)., Disp: , Rfl:  .  pravastatin (PRAVACHOL) 20 MG tablet, Take 1 tablet (20 mg total) by mouth daily., Disp: 90 tablet, Rfl: 1 .  PREMARIN vaginal cream, Place 1 application vaginally 2 (two) times a week., Disp: , Rfl: 12 .  White Petrolatum-Mineral Oil (SYSTANE NIGHTTIME) OINT, Place 1 application into both eyes at bedtime as needed (dry eyes)., Disp: , Rfl:  .  losartan (COZAAR) 25 MG tablet, Take 1 tablet (25 mg total) by mouth daily., Disp: 90 tablet, Rfl: 0 .  meclizine (ANTIVERT) 12.5 MG tablet, Take 1 tablet (12.5 mg total) by mouth 3 (three) times daily as needed for dizziness., Disp: 10 tablet, Rfl: 0  Allergies  Allergen Reactions  . Codeine     headaches  . Combigan [Brimonidine Tartrate-Timolol]     drowsy  . Sulfa Antibiotics Nausea And Vomiting    I personally reviewed active problem list, medication list, allergies, family history, social history with the patient/caregiver today.   ROS  Constitutional: Negative for fever or weight change.  Respiratory: Negative for cough and shortness of breath.   Cardiovascular: Negative for chest pain or palpitations.  Gastrointestinal: Negative for abdominal pain, no bowel changes.  Musculoskeletal: Negative for gait problem or joint swelling.  Skin: Negative for rash.  Neurological: Negative for dizziness or headache.  No other  specific complaints in a complete review of systems (except as listed in HPI above).   Objective  Vitals:   06/30/19 1507 06/30/19 1509  BP: (!) 160/100 140/80  Pulse: 69   Resp: 16   Temp: (!) 97.3 F (36.3 C)   TempSrc: Temporal   SpO2: 96%   Weight: 142 lb 8 oz (64.6 kg)   Height: 5\' 2"  (1.575 m)     Body mass index is 26.06 kg/m.  Physical Exam  Constitutional: Patient appears well-developed and well-nourished. Overweight.  No distress.  HEENT: head atraumatic, normocephalic, pupils equal and reactive to light, neck supple Cardiovascular: Normal rate, regular rhythm and normal heart sounds.  No murmur heard. No BLE edema. Pulmonary/Chest: Effort normal and breath sounds normal. No respiratory distress. Abdominal: Soft.  There is no tenderness. Psychiatric:behavior is normal,she seems anxious but happy.   Judgment and thought content normal.  Recent Results (from the past 2160 hour(s))  CBC with Differential     Status: None   Collection Time: 06/02/19 12:30 PM  Result Value Ref Range   WBC 5.9 3.8 - 10.8 Thousand/uL   RBC 4.37 3.80 - 5.10 Million/uL   Hemoglobin 13.1 11.7 - 15.5 g/dL   HCT 38.5 35.0 - 45.0 %   MCV 88.1 80.0 - 100.0 fL   MCH 30.0 27.0 - 33.0 pg   MCHC 34.0 32.0 - 36.0 g/dL   RDW 13.0 11.0 - 15.0 %   Platelets 208 140 - 400 Thousand/uL   MPV 11.9 7.5 - 12.5 fL   Neutro Abs 3,180 1,500 - 7,800 cells/uL   Lymphs Abs 2,171 850 - 3,900 cells/uL   Absolute Monocytes 360 200 - 950 cells/uL   Eosinophils Absolute 159 15 - 500 cells/uL   Basophils Absolute 30 0 - 200 cells/uL   Neutrophils Relative % 53.9 %   Total Lymphocyte 36.8 %   Monocytes Relative 6.1 %   Eosinophils Relative 2.7 %   Basophils Relative 0.5 %  Basic Metabolic Panel (BMET)     Status: None   Collection Time: 06/02/19 12:30 PM  Result Value Ref Range   Glucose, Bld 76 65 - 99 mg/dL    Comment: .            Fasting reference interval .    BUN 12 7 - 25 mg/dL   Creat 0.74  0.60 - 0.93 mg/dL    Comment: For patients >83 years of age, the reference limit for Creatinine is approximately 13% higher for people identified as African-American. .    BUN/Creatinine Ratio NOT APPLICABLE 6 - 22 (calc)   Sodium 142 135 - 146 mmol/L   Potassium 4.2 3.5 - 5.3 mmol/L   Chloride 108 98 - 110 mmol/L   CO2 26 20 - 32 mmol/L   Calcium 9.2 8.6 - 10.4 mg/dL  TSH     Status: None   Collection Time: 06/02/19 12:30 PM  Result Value Ref Range   TSH 3.24 0.40 - 4.50 mIU/L      PHQ2/9: Depression screen [facility] 2/9 06/30/2019 06/02/2019 12/30/2018 06/29/2018 03/02/2018  Decreased Interest 0 0 0 0 0  Down, Depressed, Hopeless 0 0 1 1 0  PHQ - 2 Score 0 0 1 1 0  Altered sleeping 0 0 0 0 0  Tired, decreased energy 0 0 1 2 1   Change in appetite 0 0 1 1 0  Feeling bad or failure about yourself  0 0 1 0 0  Trouble concentrating 0 0 1 0 1  Moving slowly or fidgety/restless 0 0 0 0 0  Suicidal thoughts 0 0 0 0 0  PHQ-9 Score 0 0 5 4 2   Difficult doing work/chores - Not difficult at all Not difficult at all Not difficult at all Not difficult at all  Some recent data might be hidden    phq 9 is negative, still grieving the loss of her son 6 years ago and still cries    Fall Risk: Fall Risk  06/30/2019 06/02/2019 12/30/2018 06/29/2018 02/08/2018  Falls in the past year? 0 0 0 No Yes  Comment - - - - missed step when walking down stairs  Number falls in past yr: 0 0 0 - 1  Injury with Fall? 0 0 0 - No  Risk for fall due to : - - - - History of fall(s);Impaired vision  Risk for fall due to: Comment - - - - vertigo; glaucoma; wears eyeglasses  Follow up - - - - Falls evaluation completed;Education provided;Falls prevention discussed     Assessment & Plan  1. Essential hypertension  - losartan (COZAAR) 25 MG tablet; Take 1 tablet (25 mg total) by mouth daily.  Dispense: 90 tablet; Refill: 0  2. Hyperglycemia  - Hemoglobin A1c  3. History of non anemic vitamin B12 deficiency  -  B12  4. Gastroesophageal reflux disease without esophagitis  - famotidine (PEPCID) 40 MG tablet; Take 1 tablet (40 mg total) by mouth daily as needed for heartburn or indigestion.  Dispense: 90 tablet; Refill: 0  5. Dyslipidemia  - pravastatin (PRAVACHOL) 20 MG tablet; Take 1 tablet (20 mg total) by mouth daily.  Dispense: 90 tablet; Refill: 1  6. Vertigo  Very seldom has episodes and would like to have some at hom e - meclizine (ANTIVERT) 12.5 MG tablet; Take 1 tablet (12.5 mg total) by mouth 3 (three) times daily as needed for dizziness.  Dispense: 10 tablet; Refill: 0

## 2019-07-01 LAB — HEMOGLOBIN A1C
Hgb A1c MFr Bld: 5.4 % of total Hgb (ref <5.7)
Mean Plasma Glucose: 108 (calc)
eAG (mmol/L): 6 (calc)

## 2019-07-01 LAB — VITAMIN B12: Vitamin B-12: 480 pg/mL (ref 200–1100)

## 2019-07-25 ENCOUNTER — Ambulatory Visit
Admission: RE | Admit: 2019-07-25 | Discharge: 2019-07-25 | Disposition: A | Discharge status: Home / Self Care | Payer: Medicare HMO | Source: Ambulatory Visit | Attending: Family Medicine | Admitting: Family Medicine

## 2019-07-25 DIAGNOSIS — Z1231 Encounter for screening mammogram for malignant neoplasm of breast: Secondary | ICD-10-CM | POA: Insufficient documentation

## 2019-07-26 DIAGNOSIS — H524 Presbyopia: Secondary | ICD-10-CM | POA: Diagnosis not present

## 2019-09-05 ENCOUNTER — Ambulatory Visit (INDEPENDENT_AMBULATORY_CARE_PROVIDER_SITE_OTHER): Payer: Medicare HMO

## 2019-09-05 ENCOUNTER — Other Ambulatory Visit: Payer: Self-pay

## 2019-09-05 DIAGNOSIS — Z23 Encounter for immunization: Secondary | ICD-10-CM | POA: Diagnosis not present

## 2019-09-05 NOTE — Progress Notes (Signed)
Patient is here for a blood pressure check. Patient denies chest pain, palpitations, shortness of breath or visual disturbances. At previous visit blood pressure was 140/80  with a heart rate of 69. Today during nurse visit first check blood pressure was 142/88. After resting for 10 minutes it was 140/82.  She does take blood pressure medications as prescribed.

## 2019-09-25 ENCOUNTER — Other Ambulatory Visit: Payer: Self-pay | Admitting: Family Medicine

## 2019-09-25 DIAGNOSIS — I10 Essential (primary) hypertension: Secondary | ICD-10-CM

## 2019-10-15 ENCOUNTER — Other Ambulatory Visit: Payer: Self-pay | Admitting: Family Medicine

## 2019-10-15 DIAGNOSIS — K219 Gastro-esophageal reflux disease without esophagitis: Secondary | ICD-10-CM

## 2019-10-17 DIAGNOSIS — H401132 Primary open-angle glaucoma, bilateral, moderate stage: Secondary | ICD-10-CM | POA: Diagnosis not present

## 2019-11-09 ENCOUNTER — Other Ambulatory Visit: Payer: Self-pay | Admitting: Family Medicine

## 2019-11-09 DIAGNOSIS — I10 Essential (primary) hypertension: Secondary | ICD-10-CM

## 2019-12-15 DIAGNOSIS — B373 Candidiasis of vulva and vagina: Secondary | ICD-10-CM | POA: Diagnosis not present

## 2019-12-26 DIAGNOSIS — N762 Acute vulvitis: Secondary | ICD-10-CM | POA: Diagnosis not present

## 2019-12-28 ENCOUNTER — Ambulatory Visit: Payer: Self-pay | Admitting: *Deleted

## 2019-12-28 NOTE — Telephone Encounter (Signed)
I returned her call.   She is c/o having a rash all over her body even on my head and some on her face.    She was given a medication while visiting a friend In [place] last week for a vaginal infection.   On Sunday, the last day she was to take it she started breaking out in this rash.   She took the last pill of the Rx on Sunday.    When she got home she went to her GYN here in [place], Dr. Louretta Shorten for a follow up.   She is now on another medication because the vaginal infection was not cleared up.   She was already breaking out in the rash prior to starting the medication prescribed by Dr. Corinna Capra.  She was advised to call Dr. Ancil Boozer because Dr. Corinna Capra did not think the rash was from the medication prescribed for her in [place].   She would have been breaking out in a rash before the last day of use if it was from the medication.  The agent that took her call initially tried to get her an appt but there were no appts available today or tomorrow.   She has an appt with Dr. Ancil Boozer on Monday 01/01/2020 for a check up.  I went over all the  Care advice with her carefully.   She is going to the store and get Benadryl  (she is going to check with her pharmacist since she is on eye drops for glaucoma), and 1% hydrocortisone cream.    She has been using the Aveeno for itchy skin which has been helping.    I instructed her to call us back or go to an urgent care over the weekend if she gets worse or the medications from the drug store do not help.   She verbalized understanding and was agreeable to the interventions discussed from the care advice.  I sent my notes to Dr. Ancil Boozer' office for her information.    Reason for Disposition . SEVERE itching (i.e., interferes with sleep, normal activities or school)  Answer Assessment - Initial Assessment Questions 1. APPEARANCE of RASH: "Describe the rash." (e.g., spots, blisters, raised areas, skin peeling, scaly)     I have a itchy rash all over.    I was on a  medication for infection.   I broke out Sunday.   I've had this medication before.    I had a Ob-GYN on Tues.   I was diagnosed with vaginitis.    This rash is all over even my head.   I've been using creams but nothing is helping.    Medication was given to me in [place] for a yeast infection.   Metronidizole.  I started breaking out.   I then saw my GYN Dr. Corinna Capra here in [place] as a follow up.   I took the last pill on Sunday and that's when I started breaking out.      Dr. Corinna Capra rechecked me and now I'm on metronidazlone now.   Dr. Corinna Capra said he did not think it was the medication because I would have been breaking out on the first day.   I had  A reaction like this to a medication years ago but I don't know what the medication was.    2. SIZE: "How big are the spots?" (e.g., tip of pen, eraser, coin; inches, centimeters)     It's little dots.  I've even got them on my face.  They look like a bug bite. 3. LOCATION: "Where is the rash located?"     All over my body including my face and back. 4. COLOR: "What color is the rash?" (Note: It is difficult to assess rash color in people with darker-colored skin. When this situation occurs, simply ask the caller to describe what they see.)     Red 5. ONSET: "When did the rash begin?"     Last Sunday 6. FEVER: "Do you have a fever?" If so, ask: "What is your temperature, how was it measured, and when did it start?"     No fever or headache.   I checked my temperature. 7. ITCHING: "Does the rash itch?" If so, ask: "How bad is the itch?" (Scale 1-10; or mild, moderate, severe)     Yes real bad all over. 8. CAUSE: "What do you think is causing the rash?"     The medication I was started on in [place] for a vaginal infection. 9. MEDICATION FACTORS: "Have you started any new medications within the last 2 weeks?" (e.g., antibiotics)      I just started the new medication on Tuesday that Dr. Corinna Capra gave me.   I was already breaking out from the medication given  to me in [place]. 10. OTHER SYMPTOMS: "Do you have any other symptoms?" (e.g., dizziness, headache, sore throat, joint pain)       It just itching real bad.    11. PREGNANCY: "Is there any chance you are pregnant?" "When was your last menstrual period?"       N/A  Protocols used: RASH OR REDNESS - [employer] [facility]

## 2020-01-01 ENCOUNTER — Other Ambulatory Visit: Payer: Self-pay

## 2020-01-01 ENCOUNTER — Encounter: Payer: Self-pay | Admitting: Family Medicine

## 2020-01-01 ENCOUNTER — Ambulatory Visit (INDEPENDENT_AMBULATORY_CARE_PROVIDER_SITE_OTHER): Payer: Medicare HMO | Admitting: Family Medicine

## 2020-01-01 VITALS — BP 128/80 | HR 76 | Temp 98.2°F | Resp 16 | Ht 62.0 in | Wt 142.6 lb

## 2020-01-01 DIAGNOSIS — I10 Essential (primary) hypertension: Secondary | ICD-10-CM | POA: Diagnosis not present

## 2020-01-01 DIAGNOSIS — M1711 Unilateral primary osteoarthritis, right knee: Secondary | ICD-10-CM | POA: Diagnosis not present

## 2020-01-01 DIAGNOSIS — K219 Gastro-esophageal reflux disease without esophagitis: Secondary | ICD-10-CM | POA: Diagnosis not present

## 2020-01-01 DIAGNOSIS — R739 Hyperglycemia, unspecified: Secondary | ICD-10-CM

## 2020-01-01 DIAGNOSIS — M810 Age-related osteoporosis without current pathological fracture: Secondary | ICD-10-CM

## 2020-01-01 DIAGNOSIS — F339 Major depressive disorder, recurrent, unspecified: Secondary | ICD-10-CM

## 2020-01-01 DIAGNOSIS — D692 Other nonthrombocytopenic purpura: Secondary | ICD-10-CM | POA: Diagnosis not present

## 2020-01-01 DIAGNOSIS — E785 Hyperlipidemia, unspecified: Secondary | ICD-10-CM

## 2020-01-01 MED ORDER — LOSARTAN POTASSIUM 25 MG PO TABS: 25.0000 mg | ORAL_TABLET | Freq: Every day | ORAL | 1 refills | Status: DC

## 2020-01-01 MED ORDER — BUSPIRONE HCL 5 MG PO TABS: 5.0000 mg | ORAL_TABLET | Freq: Two times a day (BID) | ORAL | 0 refills | Status: DC

## 2020-01-01 MED ORDER — PRAVASTATIN SODIUM 20 MG PO TABS: 20.0000 mg | ORAL_TABLET | Freq: Every day | ORAL | 1 refills | Status: DC

## 2020-01-01 NOTE — Progress Notes (Signed)
Name: [name]   MRN: [id number]    DOB: 1941/04/17   Date:01/01/2020       Progress Note  Subjective  Chief Complaint  Chief Complaint  Patient presents with  . Hypertension  . Hyperlipidemia  . Depression  . Gastroesophageal Reflux  . Hyperglycemia    HPI  OAright knee: going on for years, used to see Dr. Marlou Sa and has a history of left knee meniscal tear repair,she saw Dr. Marlou Sa 07/2018 and has severe OA, pain is not constant, aggravated by activity but able to tolerate and she does not have surgery. Seeing Chriopractor - Dr. Well and is doing better with accupuncture.Symptoms are stable, taking Tylenol prn Unchanged   HTN: she has been taking losartan and bp is at goal now, no chest pain or palpitation, no orthostatic hypotension   Hyperlipidemia: taking pravastatin and denies side effects of medication, including no myalgia. Last LDL was at goal at 74, continue medication  Depression Major Chronic: she states doing well, still misses her son , that died at age 80 from [place] back in 2013/02/16. Her husband died 80 years before , on the same day on October 14 th. She has two grandchildren from him and two other grandchildren from her youngest son.She states she really struggled from October until the end of December, she states in January she started to feel better. She does not want to take anything daily but willing to take something prn. We will try Buspar   GERD: she has stopped omeprazole but is now on Pepcid prn and is doing well   Hyperglycemia: A1C was 5.8% but last visit was 5.4%  Denies polyphagia, polydipsia or polyuria.We will recheck labs  BV: she was treated in [place] and caused a generalized rash and pruritis and was treated by gyn and same symptoms , she is off medication and itching improved, we will add as an allergy. Vaginal symptoms resolved   Patient Active Problem List   Diagnosis Date Noted  . Senile purpura ([place]) 12/30/2018  . S/P vaginal  hysterectomy 05/30/2018  . Recurrent major depressive disorder, in full remission ([place]) 03/02/2018  . GERD (gastroesophageal reflux disease) 10/20/2016  . Anxiety, generalized 01/28/2016  . Osteoarthritis of right knee 12/25/2015  . Depression 09/05/2015  . Abnormal brain CT 06/20/2015  . Anxiety, mild 06/20/2015  . Brain lesion 06/20/2015  . Clinical depression 06/20/2015  . Dyslipidemia 06/20/2015  . Hyperlipidemia LDL goal <100 06/20/2015  . Benign hypertension 06/20/2015  . Affective disorder, major 06/20/2015  . Arthralgia of shoulder 06/20/2015  . Glaucoma 06/20/2015  . Post-menopausal 06/04/2015  . Bilateral leg weakness 06/04/2015    Past Surgical History:  Procedure Laterality Date  . ANAL FISSURE REPAIR  1970s   for bleeding in 1970s  . ANTERIOR AND POSTERIOR REPAIR WITH SACROSPINOUS FIXATION N/A 05/30/2018   Procedure: ANTERIOR AND POSTERIOR REPAIR;  Surgeon: Louretta Shorten, MD;  Location: [facility];  Service: Gynecology;  Laterality: N/A;  . COLONOSCOPY  02-17-11   [place]  . COLONOSCOPY WITH PROPOFOL N/A 09/01/2016   Procedure: COLONOSCOPY WITH PROPOFOL;  Surgeon: Robert Bellow, MD;  Location[facility];  Service: Endoscopy;  Laterality: N/A;  . EYE SURGERY Bilateral    cataracts removed  . KNEE SURGERY Left   . SALPINGOOPHORECTOMY Right 05/30/2018   Procedure: SALPINGO OOPHORECTOMY;  Surgeon: Louretta Shorten, MD;  Location: [facility];  Service: Gynecology;  Laterality: Right;  . SUBCLAVIAN ANGIOGRAM    . UPPER GI ENDOSCOPY  02-17-11  .  VAGINAL HYSTERECTOMY N/A 05/30/2018   Procedure: HYSTERECTOMY VAGINAL;  Surgeon: Louretta Shorten, MD;  Location: [facility];  Service: Gynecology;  Laterality: N/A;  . WISDOM TOOTH EXTRACTION      Family History  Problem Relation Age of Onset  . Cancer Mother 79       colon  . Alzheimer's disease Mother   . Hypertension Mother   . Cancer Brother        prostate  . Lymphoma Son   . Cancer Son 63       lymphoma  . Hypertension Father   .  Cerebral aneurysm Father   . Diabetes Sister   . Depression Sister   . Hypertension Sister   . Depression Sister   . Cancer Brother   . Gallbladder disease Brother   . Prostate cancer Brother   . Breast cancer Neg Hx     Social History   Tobacco Use  . Smoking status: Never Smoker  . Smokeless tobacco: Never Used  . Tobacco comment: smoking cessation materials not required  Substance Use Topics  . Alcohol use: Yes    Alcohol/week: 0.0 standard drinks    Comment: occasional beer/mixed drink  . Drug use: No     Current Outpatient Medications:  .  ALPHAGAN P 0.1 % SOLN, Place 1 drop into both eyes 3 (three) times daily. , Disp: , Rfl:  .  bimatoprost (LUMIGAN) 0.01 % SOLN, Place 1 drop into both eyes at bedtime., Disp: , Rfl:  .  Biotin 10000 MCG TABS, Take 1 tablet by mouth at bedtime., Disp: , Rfl:  .  famotidine (PEPCID) 40 MG tablet, TAKE 1 TABLET (40 MG TOTAL) BY MOUTH DAILY AS NEEDED FOR HEARTBURN OR INDIGESTION., Disp: 90 tablet, Rfl: 0 .  losartan (COZAAR) 25 MG tablet, TAKE 1 TABLET BY MOUTH EVERY DAY, Disp: 90 tablet, Rfl: 0 .  meclizine (ANTIVERT) 12.5 MG tablet, Take 1 tablet (12.5 mg total) by mouth 3 (three) times daily as needed for dizziness., Disp: 10 tablet, Rfl: 0 .  OVER THE COUNTER MEDICATION, Take 1 tablet by mouth daily. Steel Libido - otc supplement, Disp: , Rfl:  .  Polyethyl Glycol-Propyl Glycol (SYSTANE OP), Place 1 drop into both eyes as needed (dry eyes)., Disp: , Rfl:  .  pravastatin (PRAVACHOL) 20 MG tablet, Take 1 tablet (20 mg total) by mouth daily., Disp: 90 tablet, Rfl: 1 .  PREMARIN vaginal cream, Place 1 application vaginally 2 (two) times a week., Disp: , Rfl: 12 .  White Petrolatum-Mineral Oil (SYSTANE NIGHTTIME) OINT, Place 1 application into both eyes at bedtime as needed (dry eyes)., Disp: , Rfl:  .  Flaxseed, Linseed, (FLAX SEED OIL) 1000 MG CAPS, Take 1,000 mg by mouth daily., Disp: , Rfl:  .  nystatin-triamcinolone ointment (MYCOLOG),  nystatin-triamcinolone 100,000 unit/gram-0.1 % topical ointment  APPLY TO THE AFFECTED AREA(S) BY TOPICAL ROUTE 2 TIMES PER DAY as needed, Disp: , Rfl:   Allergies  Allergen Reactions  . Codeine     headaches  . Combigan [Brimonidine Tartrate-Timolol]     drowsy  . Metronidazole     Rash and itching   . Sulfa Antibiotics Nausea And Vomiting    I personally reviewed active problem list, medication list, allergies, family history, social history, health maintenance with the patient/caregiver today.   ROS  Constitutional: Negative for fever or weight change.  Respiratory: Negative for cough and shortness of breath.   Cardiovascular: Negative for chest pain or palpitations.  Gastrointestinal: Negative for  abdominal pain, no bowel changes.  Musculoskeletal: Negative for gait problem or joint swelling.  Skin: Negative for rash.  Neurological: Negative for dizziness or headache.  No other specific complaints in a complete review of systems (except as listed in HPI above).  Objective  Vitals:   01/01/20 1525  BP: 128/80  Pulse: 76  Resp: 16  Temp: 98.2 F (36.8 C)  SpO2: 98%  Weight: 142 lb 9.6 oz (64.7 kg)  Height: 5\' 2"  (1.575 m)    Body mass index is 26.08 kg/m.  Physical Exam  Constitutional: Patient appears well-developed and well-nourished. Overweight.  No distress.  HEENT: head atraumatic, normocephalic, pupils equal and reactive to light Cardiovascular: Normal rate, regular rhythm and normal heart sounds.  No murmur heard. No BLE edema. Pulmonary/Chest: Effort normal and breath sounds normal. No respiratory distress. Abdominal: Soft.  There is no tenderness. Skin: senile purpura  Psychiatric: Patient has a normal mood and affect. behavior is normal. Judgment and thought content normal.  PHQ2/9: Depression screen [facility] 2/9 01/01/2020 06/30/2019 06/02/2019 12/30/2018 06/29/2018  Decreased Interest 0 0 0 0 0  Down, Depressed, Hopeless 0 0 0 1 1  PHQ - 2 Score 0 0 0 1 1   Altered sleeping 0 0 0 0 0  Tired, decreased energy 2 0 0 1 2  Change in appetite 0 0 0 1 1  Feeling bad or failure about yourself  0 0 0 1 0  Trouble concentrating 0 0 0 1 0  Moving slowly or fidgety/restless 0 0 0 0 0  Suicidal thoughts 0 0 0 0 0  PHQ-9 Score 2 0 0 5 4  Difficult doing work/chores Not difficult at all - Not difficult at all Not difficult at all Not difficult at all  Some recent data might be hidden    phq 9 is negative   Fall Risk: Fall Risk  01/01/2020 06/30/2019 06/02/2019 12/30/2018 06/29/2018  Falls in the past year? 0 0 0 0 No  Comment - - - - -  Number falls in past yr: 0 0 0 0 -  Injury with Fall? 0 0 0 0 -  Risk for fall due to : - - - - -  Risk for fall due to: Comment - - - - -  Follow up - - - - -     Functional Status Survey: Is the patient deaf or have difficulty hearing?: No Does the patient have difficulty seeing, even when wearing glasses/contacts?: No Does the patient have difficulty concentrating, remembering, or making decisions?: No Does the patient have difficulty walking or climbing stairs?: No Does the patient have difficulty dressing or bathing?: No Does the patient have difficulty doing errands alone such as visiting a doctor's office or shopping?: No    Assessment & Plan  1. Essential hypertension  - COMPLETE METABOLIC PANEL WITH GFR - losartan (COZAAR) 25 MG tablet; Take 1 tablet (25 mg total) by mouth daily.  Dispense: 90 tablet; Refill: 1  2. Senile purpura ([place])   3. Hyperglycemia  - Hemoglobin A1c  4. Dyslipidemia  - Lipid panel - pravastatin (PRAVACHOL) 20 MG tablet; Take 1 tablet (20 mg total) by mouth daily.  Dispense: 90 tablet; Refill: 1  5. Gastroesophageal reflux disease without esophagitis   6. Age-related osteoporosis without current pathological fracture   7. Primary osteoarthritis of right knee  stable  8. Major depression, recurrent, chronic (HCC)  - busPIRone (BUSPAR) 5 MG tablet; Take 1  tablet (5 mg total) by  mouth 2 (two) times daily.  Dispense: 60 tablet; Refill: 0

## 2020-01-02 LAB — COMPLETE METABOLIC PANEL WITH GFR
AG Ratio: 1.6 (calc) (ref 1.0–2.5)
ALT: 23 U/L (ref 6–29)
AST: 21 U/L (ref 10–35)
Albumin: 4.6 g/dL (ref 3.6–5.1)
Alkaline phosphatase (APISO): 51 U/L (ref 37–153)
BUN: 12 mg/dL (ref 7–25)
CO2: 27 mmol/L (ref 20–32)
Calcium: 10.4 mg/dL (ref 8.6–10.4)
Chloride: 108 mmol/L (ref 98–110)
Creat: 0.83 mg/dL (ref 0.60–0.93)
GFR, Est African American: 78 mL/min/{1.73_m2} (ref >=60)
GFR, Est Non African American: 67 mL/min/{1.73_m2} (ref >=60)
Globulin: 2.8 g/dL (calc) (ref 1.9–3.7)
Glucose, Bld: 97 mg/dL (ref 65–99)
Potassium: 5.3 mmol/L (ref 3.5–5.3)
Sodium: 146 mmol/L (ref 135–146)
Total Bilirubin: 0.4 mg/dL (ref 0.2–1.2)
Total Protein: 7.4 g/dL (ref 6.1–8.1)

## 2020-01-02 LAB — HEMOGLOBIN A1C
Hgb A1c MFr Bld: 5.4 % of total Hgb (ref <5.7)
Mean Plasma Glucose: 108 (calc)
eAG (mmol/L): 6 (calc)

## 2020-01-02 LAB — LIPID PANEL
Cholesterol: 178 mg/dL (ref <200)
HDL: 60 mg/dL (ref >=50)
LDL Cholesterol (Calc): 85 mg/dL (calc)
Non-HDL Cholesterol (Calc): 118 mg/dL (calc) (ref <130)
Total CHOL/HDL Ratio: 3 (calc) (ref <5.0)
Triglycerides: 250 mg/dL — ABNORMAL HIGH (ref <150)

## 2020-01-24 ENCOUNTER — Other Ambulatory Visit: Payer: Self-pay | Admitting: Family Medicine

## 2020-01-24 DIAGNOSIS — F339 Major depressive disorder, recurrent, unspecified: Secondary | ICD-10-CM

## 2020-01-24 NOTE — Telephone Encounter (Signed)
Requested medication (s) are due for refill today: Yes  Requested medication (s) are on the active medication list: Yes  Last refill:  01/01/20  Future visit scheduled: Yes  Notes to clinic:  Pharmacy asking for diagnosis code.    Requested Prescriptions  Pending Prescriptions Disp Refills   busPIRone (BUSPAR) 5 MG tablet [Pharmacy Med Name: BUSPIRONE HCL 5 MG TABLET] 180 tablet 1    Sig: TAKE 1 TABLET BY MOUTH TWICE A DAY      Psychiatry: Anxiolytics/Hypnotics - Non-controlled Passed - 01/24/2020  8:30 AM      Passed - Valid encounter within last 6 months    Recent Outpatient Visits           3 weeks ago Essential hypertension   [facility] Steele Sizer, MD   6 months ago Essential hypertension   [facility] Steele Sizer, MD   7 months ago Generalized weakness   [place], NP   1 year ago Major depression, recurrent, chronic [facility])   [facility] Steele Sizer, MD   1 year ago Dyslipidemia   [facility] Steele Sizer, MD       Future Appointments             Tomorrow  [facility], [place]   In 5 months Steele Sizer, MD [facility], [facility]

## 2020-01-25 ENCOUNTER — Ambulatory Visit (INDEPENDENT_AMBULATORY_CARE_PROVIDER_SITE_OTHER): Payer: Medicare HMO

## 2020-01-25 VITALS — Ht 62.0 in | Wt 141.0 lb

## 2020-01-25 DIAGNOSIS — Z0000 Encounter for general adult medical examination without abnormal findings: Secondary | ICD-10-CM

## 2020-01-25 NOTE — Progress Notes (Signed)
Subjective:   [name] is a 79 y.o. female who presents for Medicare Annual (Subsequent) preventive examination.  Virtual Visit via Telephone Note  I connected with [name] on 01/25/20 at  1:30 PM EST by telephone and verified that I am speaking with the correct person using two identifiers.  Medicare Annual Wellness visit completed telephonically due to Covid-19 pandemic.   Location: Patient: home Provider: office   I discussed the limitations, risks, security and privacy concerns of performing an evaluation and management service by telephone and the availability of in person appointments. The patient expressed understanding and agreed to proceed.  Some vital signs may be absent or patient reported.   Clemetine Marker, LPN    Review of Systems:   Cardiac Risk Factors include: advanced age (>54men, >18 women);dyslipidemia;hypertension     Objective:     Vitals: Ht 5\' 2"  (1.575 m)   Wt 141 lb (64 kg)   BMI 25.79 kg/m   Body mass index is 25.79 kg/m.  Advanced Directives 01/25/2020 05/30/2018 05/18/2018 02/08/2018 08/09/2017 05/26/2017 03/01/2017  Does Patient Have a Medical Advance Directive? Yes Yes Yes Yes No No Yes  Type of Paramedic of [place];Living will Living will;Healthcare Power of Attorney Living will;Healthcare Power of [place];Living will - - Living will;Healthcare Power of Attorney  Does patient want to make changes to medical advance directive? - No - Patient declined - - - - -  Copy of [name] in Chart? No - copy requested No - copy requested No - copy requested No - copy requested - - No - copy requested  Would patient like information on creating a medical advance directive? - - - - - - -  Some encounter information is confidential and restricted. Go to Review Flowsheets activity to see all data.    Tobacco Social History   Tobacco Use  Smoking Status Never Smoker    Smokeless Tobacco Never Used  Tobacco Comment   smoking cessation materials not required     Counseling given: Not Answered Comment: smoking cessation materials not required   Clinical Intake:  Pre-visit preparation completed: Yes  Pain : No/denies pain     BMI - recorded: 25.79 Nutritional Status: BMI 25 -29 Overweight Nutritional Risks: None Diabetes: No  How often do you need to have someone help you when you read instructions, pamphlets, or other written materials from your doctor or pharmacy?: 1 - Never  Interpreter Needed?: No  Information entered by :: Clemetine Marker LPN  Past Medical History:  Diagnosis Date  . Anemia   . Anxiety    no meds  . Arthritis    hands, knees - no meds  . Bilateral leg weakness   . Dental bridge present    perm lower front bottom bridge  . Depression    no meds  . Eyes swollen    and red - was seen by MD for possible allergy to new eye drops  . GERD (gastroesophageal reflux disease)    diet controlled - only takes med prn basis  . Glaucoma   . Hypercholesteremia   . Hypertension   . SVD (spontaneous vaginal delivery)    x 3 - only one currently living  . Vertigo    Past Surgical History:  Procedure Laterality Date  . ANAL FISSURE REPAIR  1970s   for bleeding in 1970s  . ANTERIOR AND POSTERIOR REPAIR WITH SACROSPINOUS FIXATION N/A 05/30/2018  Procedure: ANTERIOR AND POSTERIOR REPAIR;  Surgeon: Catherine Shorten, MD;  Location: [facility];  Service: Gynecology;  Laterality: N/A;  . COLONOSCOPY  2012   [place]  . COLONOSCOPY WITH PROPOFOL N/A 09/01/2016   Procedure: COLONOSCOPY WITH PROPOFOL;  Surgeon: Catherine Bellow, MD;  Location: [facility];  Service: Endoscopy;  Laterality: N/A;  . EYE SURGERY Bilateral    cataracts removed  . KNEE SURGERY Left   . SALPINGOOPHORECTOMY Right 05/30/2018   Procedure: SALPINGO OOPHORECTOMY;  Surgeon: Catherine Shorten, MD;  Location: [facility];  Service: Gynecology;  Laterality: Right;  . SUBCLAVIAN  ANGIOGRAM    . UPPER GI ENDOSCOPY  2012  . VAGINAL HYSTERECTOMY N/A 05/30/2018   Procedure: HYSTERECTOMY VAGINAL;  Surgeon: Catherine Shorten, MD;  Location: [facility];  Service: Gynecology;  Laterality: N/A;  . WISDOM TOOTH EXTRACTION     Family History  Problem Relation Age of Onset  . Cancer Mother 10       colon  . Alzheimer's disease Mother   . Hypertension Mother   . Cancer Brother        prostate  . Lymphoma Son   . Cancer Son 73       lymphoma  . Hypertension Father   . Cerebral aneurysm Father   . Diabetes Sister   . Depression Sister   . Hypertension Sister   . Depression Sister   . Cancer Brother   . Gallbladder disease Brother   . Prostate cancer Brother   . Breast cancer Neg Hx    Social History   Socioeconomic History  . Marital status: Widowed    Spouse name: [name]  . Number of children: 3  . Years of education: Not on file  . Highest education level: 12th grade  Occupational History    Employer: [employer]    Comment: retired  Tobacco Use  . Smoking status: Never Smoker  . Smokeless tobacco: Never Used  . Tobacco comment: smoking cessation materials not required  Substance and Sexual Activity  . Alcohol use: Yes    Alcohol/week: 0.0 standard drinks    Comment: occasional beer/mixed drink  . Drug use: No  . Sexual activity: Yes    Partners: Male    Birth control/protection: Post-menopausal  Other Topics Concern  . Not on file  Social History Narrative  . Not on file   Social Determinants of Health   Financial Resource Strain: Low Risk   . Difficulty of Paying Living Expenses: Not hard at all  Food Insecurity: No Food Insecurity  . Worried About Charity fundraiser in the Last Year: Never true  . Ran Out of Food in the Last Year: Never true  Transportation Needs: No Transportation Needs  . Lack of Transportation (Medical): No  . Lack of Transportation (Non-Medical): No  Physical Activity: Sufficiently Active  . Days of Exercise per Week: 3 days  .  Minutes of Exercise per Session: 60 min  Stress: No Stress Concern Present  . Feeling of Stress : Only a little  Social Connections: Unknown  . Frequency of Communication with Friends and Family: Patient refused  . Frequency of Social Gatherings with Friends and Family: Patient refused  . Attends Religious Services: Patient refused  . Active Member of Clubs or Organizations: Patient refused  . Attends Archivist Meetings: Patient refused  . Marital Status: Widowed    Outpatient Encounter Medications as of 01/25/2020  Medication Sig  . ALPHAGAN P 0.1 % SOLN Place 1 drop into  both eyes 3 (three) times daily.   . bimatoprost (LUMIGAN) 0.01 % SOLN Place 1 drop into both eyes at bedtime.  . Biotin 10000 MCG TABS Take 1 tablet by mouth at bedtime.  . famotidine (PEPCID) 40 MG tablet TAKE 1 TABLET (40 MG TOTAL) BY MOUTH DAILY AS NEEDED FOR HEARTBURN OR INDIGESTION.  Marland Kitchen losartan (COZAAR) 25 MG tablet Take 1 tablet (25 mg total) by mouth daily.  . meclizine (ANTIVERT) 12.5 MG tablet Take 1 tablet (12.5 mg total) by mouth 3 (three) times daily as needed for dizziness.  Vladimir Faster Glycol-Propyl Glycol (SYSTANE OP) Place 1 drop into both eyes as needed (dry eyes).  . pravastatin (PRAVACHOL) 20 MG tablet Take 1 tablet (20 mg total) by mouth daily.  Marland Kitchen PREMARIN vaginal cream Place 1 application vaginally 2 (two) times a week.  Dema Severin Petrolatum-Mineral Oil (SYSTANE NIGHTTIME) OINT Place 1 application into both eyes at bedtime as needed (dry eyes).  . busPIRone (BUSPAR) 5 MG tablet Take 1 tablet (5 mg total) by mouth 2 (two) times daily. (Patient not taking: Reported on 01/25/2020)  . Flaxseed, Linseed, (FLAX SEED OIL) 1000 MG CAPS Take 1,000 mg by mouth daily.  . [DISCONTINUED] nystatin-triamcinolone ointment (MYCOLOG) nystatin-triamcinolone 100,000 unit/gram-0.1 % topical ointment  APPLY TO THE AFFECTED AREA(S) BY TOPICAL ROUTE 2 TIMES PER DAY as needed  . [DISCONTINUED] OVER THE COUNTER  MEDICATION Take 1 tablet by mouth daily. Steel Libido - otc supplement   No facility-administered encounter medications on file as of 01/25/2020.    Activities of Daily Living In your present state of health, do you have any difficulty performing the following activities: 01/25/2020 01/01/2020  Hearing? N N  Comment declines hearing aids -  Vision? N N  Difficulty concentrating or making decisions? N N  Walking or climbing stairs? N N  Dressing or bathing? N N  Doing errands, shopping? N N  Preparing Food and eating ? N -  Using the Toilet? N -  In the past six months, have you accidently leaked urine? N -  Do you have problems with loss of bowel control? N -  Managing your Medications? N -  Managing your Finances? N -  Housekeeping or managing your Housekeeping? N -  Some recent data might be hidden    Patient Care Team: Steele Sizer, MD as PCP - General (Family Medicine) Dingeldein, Remo Lipps, MD as Consulting Physician (Ophthalmology) Catherine Shorten, MD as Consulting Physician (Obstetrics and Gynecology)    Assessment:   This is a routine wellness examination for Vonya.  Exercise Activities and Dietary recommendations Current Exercise Habits: Home exercise routine, Type of exercise: walking;Other - see comments(staying busy), Time (Minutes): 60, Frequency (Times/Week): 3, Weekly Exercise (Minutes/Week): 180, Intensity: Moderate, Exercise limited by: orthopedic condition(s)  Goals    . DIET - INCREASE WATER INTAKE     Recommend to drink at least 6-8 8oz glasses of water per day.       Fall Risk Fall Risk  01/25/2020 01/01/2020 06/30/2019 06/02/2019 12/30/2018  Falls in the past year? 0 0 0 0 0  Comment - - - - -  Number falls in past yr: 0 0 0 0 0  Injury with Fall? 0 0 0 0 0  Risk for fall due to : No Fall Risks - - - -  Risk for fall due to: Comment - - - - -  Follow up Falls prevention discussed - - - -   FALL RISK PREVENTION PERTAINING TO THE HOME:  Any stairs in or  around the home? Yes  If so, do they handrails? Yes   Home free of loose throw rugs in walkways, pet beds, electrical cords, etc? Yes  Adequate lighting in your home to reduce risk of falls? Yes   ASSISTIVE DEVICES UTILIZED TO PREVENT FALLS:  Life alert? No  Use of a cane, walker or w/c? No  Grab bars in the bathroom? No  Shower chair or bench in shower? No  Elevated toilet seat or a handicapped toilet? No   DME ORDERS:  DME order needed?  No   TIMED UP AND GO:  Was the test performed? No . Telephonic visit.   Education: Fall risk prevention has been discussed.  Intervention(s) required? No    Depression Screen PHQ 2/9 Scores 01/25/2020 01/01/2020 06/30/2019 06/02/2019  PHQ - 2 Score 0 0 0 0  PHQ- 9 Score - 2 0 0     Cognitive Function     6CIT Screen 01/25/2020 02/08/2018 03/01/2017  What Year? 0 points 0 points 0 points  What month? 0 points 0 points 0 points  What time? 0 points 0 points 0 points  Count back from 20 0 points 0 points 0 points  Months in reverse 0 points 0 points 0 points  Repeat phrase 0 points 0 points 4 points  Total Score 0 0 4    Immunization History  Administered Date(s) Administered  . Fluad Quad(high Dose 65+) 09/05/2019  . Influenza, High Dose Seasonal PF 12/25/2015, 10/20/2016, 10/15/2017, 12/30/2018  . Influenza,inj,Quad PF,6+ Mos 09/04/2014  . Pneumococcal Conjugate-13 03/01/2017  . Pneumococcal Polysaccharide-23 05/14/2014  . Tdap 11/17/2011    Qualifies for Shingles Vaccine? Yes . Due for Shingrix. Education has been provided regarding the importance of this vaccine. Pt has been advised to call insurance company to determine out of pocket expense. Advised may also receive vaccine at local pharmacy or Health Dept. Verbalized acceptance and understanding.  Tdap: Up to date  Flu Vaccine: Up to date  Pneumococcal Vaccine: Up to date   Screening Tests Health Maintenance  Topic Date Due  . TETANUS/TDAP  11/16/2021  . INFLUENZA  VACCINE  Completed  . DEXA SCAN  Completed  . PNA vac Low Risk Adult  Completed    Cancer Screenings:  Colorectal Screening: No longer required.   Mammogram: Completed 07/25/19. Repeat every year;   Bone Density: Completed 12/30/16. Results reflect OSTEOPOROSIS. Repeat every 2 years. Pt requests to discuss with Dr. Ancil Boozer to see if she needs to repeat.   Lung Cancer Screening: (Low Dose CT Chest recommended if Age 17-80 years, 30 pack-year currently smoking OR have quit w/in 15years.) does not qualify.   Additional Screening:  Hepatitis C Screening: no longer required  Vision Screening: Recommended annual ophthalmology exams for early detection of glaucoma and other disorders of the eye. Is the patient up to date with their annual eye exam?  Yes  Who is the provider or what is the name of the office in which the pt attends annual eye exams? [place] Screening: Recommended annual dental exams for proper oral hygiene  Community Resource Referral:  CRR required this visit?  No      Plan:     I have personally reviewed and addressed the Medicare Annual Wellness questionnaire and have noted the following in the patient's chart:  A. Medical and social history B. Use of alcohol, tobacco or illicit drugs  C. Current medications and supplements D. Functional ability and  status E.  Nutritional status F.  Physical activity G. Advance directives H. List of other physicians I.  Hospitalizations, surgeries, and ER visits in previous 12 months J.  Theresa such as hearing and vision if needed, cognitive and depression L. Referrals and appointments   In addition, I have reviewed and discussed with patient certain preventive protocols, quality metrics, and best practice recommendations. A written personalized care plan for preventive services as well as general preventive health recommendations were provided to patient.   Signed,  Clemetine Marker, LPN Nurse  Health Advisor   Nurse Notes: none

## 2020-01-25 NOTE — Patient Instructions (Signed)
[name] , Thank you for taking time to come for your Medicare Wellness Visit. I appreciate your ongoing commitment to your health goals. Please review the following plan we discussed and let me know if I can assist you in the future.   Screening recommendations/referrals: Colonoscopy: no longer required Mammogram: done 07/25/19 Bone Density: done 12/30/16 Recommended yearly ophthalmology/optometry visit for glaucoma screening and checkup Recommended yearly dental visit for hygiene and checkup  Vaccinations: Influenza vaccine: done 09/05/19 Pneumococcal vaccine: done 03/01/17 Tdap vaccine: done 2013 Shingles vaccine: Shingrix discussed. Please contact your pharmacy for coverage information.   Advanced directives: Please bring a copy of your health care power of attorney and living will to the office at your convenience.  Conditions/risks identified: Recommend drinking 6-8 glasses of water per day  Next appointment: Please follow up in one year for your Medicare Annual Wellness visit.     Preventive Care 80 Years and Older, Female Preventive care refers to lifestyle choices and visits with your health care provider that can promote health and wellness. What does preventive care include?  A yearly physical exam. This is also called an annual well check.  Dental exams once or twice a year.  Routine eye exams. Ask your health care provider how often you should have your eyes checked.  Personal lifestyle choices, including:  Daily care of your teeth and gums.  Regular physical activity.  Eating a healthy diet.  Avoiding tobacco and drug use.  Limiting alcohol use.  Practicing safe sex.  Taking low-dose aspirin every day.  Taking vitamin and mineral supplements as recommended by your health care provider. What happens during an annual well check? The services and screenings done by your health care provider during your annual well check will depend on your age, overall  health, lifestyle risk factors, and family history of disease. Counseling  Your health care provider may ask you questions about your:  Alcohol use.  Tobacco use.  Drug use.  Emotional well-being.  Home and relationship well-being.  Sexual activity.  Eating habits.  History of falls.  Memory and ability to understand (cognition).  Work and work Statistician.  Reproductive health. Screening  You may have the following tests or measurements:  Height, weight, and BMI.  Blood pressure.  Lipid and cholesterol levels. These may be checked every 5 years, or more frequently if you are over 80 years old.  Skin check.  Lung cancer screening. You may have this screening every year starting at age 80 if you have a 30-pack-year history of smoking and currently smoke or have quit within the past 15 years.  Fecal occult blood test (FOBT) of the stool. You may have this test every year starting at age 80.  Flexible sigmoidoscopy or colonoscopy. You may have a sigmoidoscopy every 5 years or a colonoscopy every 10 years starting at age 80.  Hepatitis C blood test.  Hepatitis B blood test.  Sexually transmitted disease (STD) testing.  Diabetes screening. This is done by checking your blood sugar (glucose) after you have not eaten for a while (fasting). You may have this done every 1-3 years.  Bone density scan. This is done to screen for osteoporosis. You may have this done starting at age 50.  Mammogram. This may be done every 1-2 years. Talk to your health care provider about how often you should have regular mammograms. Talk with your health care provider about your test results, treatment options, and if necessary, the need for more tests. Vaccines  Your health care provider may recommend certain vaccines, such as:  Influenza vaccine. This is recommended every year.  Tetanus, diphtheria, and acellular pertussis (Tdap, Td) vaccine. You may need a Td booster every 10  years.  Zoster vaccine. You may need this after age 65.  Pneumococcal 13-valent conjugate (PCV13) vaccine. One dose is recommended after age 80.  Pneumococcal polysaccharide (PPSV23) vaccine. One dose is recommended after age 80. Talk to your health care provider about which screenings and vaccines you need and how often you need them. This information is not intended to replace advice given to you by your health care provider. Make sure you discuss any questions you have with your health care provider. Document Released: 11/29/2015 Document Revised: 07/22/2016 Document Reviewed: 09/03/2015 Elsevier Interactive Patient Education  2017 [place] Prevention in the Home Falls can cause injuries. They can happen to people of all ages. There are many things you can do to make your home safe and to help prevent falls. What can I do on the outside of my home?  Regularly fix the edges of walkways and driveways and fix any cracks.  Remove anything that might make you trip as you walk through a door, such as a raised step or threshold.  Trim any bushes or trees on the path to your home.  Use bright outdoor lighting.  Clear any walking paths of anything that might make someone trip, such as rocks or tools.  Regularly check to see if handrails are loose or broken. Make sure that both sides of any steps have handrails.  Any raised decks and porches should have guardrails on the edges.  Have any leaves, snow, or ice cleared regularly.  Use sand or salt on walking paths during winter.  Clean up any spills in your garage right away. This includes oil or grease spills. What can I do in the bathroom?  Use night lights.  Install grab bars by the toilet and in the tub and shower. Do not use towel bars as grab bars.  Use non-skid mats or decals in the tub or shower.  If you need to sit down in the shower, use a plastic, non-slip stool.  Keep the floor dry. Clean up any water that  spills on the floor as soon as it happens.  Remove soap buildup in the tub or shower regularly.  Attach bath mats securely with double-sided non-slip rug tape.  Do not have throw rugs and other things on the floor that can make you trip. What can I do in the bedroom?  Use night lights.  Make sure that you have a light by your bed that is easy to reach.  Do not use any sheets or blankets that are too big for your bed. They should not hang down onto the floor.  Have a firm chair that has side arms. You can use this for support while you get dressed.  Do not have throw rugs and other things on the floor that can make you trip. What can I do in the kitchen?  Clean up any spills right away.  Avoid walking on wet floors.  Keep items that you use a lot in easy-to-reach places.  If you need to reach something above you, use a strong step stool that has a grab bar.  Keep electrical cords out of the way.  Do not use floor polish or wax that makes floors slippery. If you must use wax, use non-skid floor wax.  Do  not have throw rugs and other things on the floor that can make you trip. What can I do with my stairs?  Do not leave any items on the stairs.  Make sure that there are handrails on both sides of the stairs and use them. Fix handrails that are broken or loose. Make sure that handrails are as long as the stairways.  Check any carpeting to make sure that it is firmly attached to the stairs. Fix any carpet that is loose or worn.  Avoid having throw rugs at the top or bottom of the stairs. If you do have throw rugs, attach them to the floor with carpet tape.  Make sure that you have a light switch at the top of the stairs and the bottom of the stairs. If you do not have them, ask someone to add them for you. What else can I do to help prevent falls?  Wear shoes that:  Do not have high heels.  Have rubber bottoms.  Are comfortable and fit you well.  Are closed at the  toe. Do not wear sandals.  If you use a stepladder:  Make sure that it is fully opened. Do not climb a closed stepladder.  Make sure that both sides of the stepladder are locked into place.  Ask someone to hold it for you, if possible.  Clearly mark and make sure that you can see:  Any grab bars or handrails.  First and last steps.  Where the edge of each step is.  Use tools that help you move around (mobility aids) if they are needed. These include:  Canes.  Walkers.  Scooters.  Crutches.  Turn on the lights when you go into a dark area. Replace any light bulbs as soon as they burn out.  Set up your furniture so you have a clear path. Avoid moving your furniture around.  If any of your floors are uneven, fix them.  If there are any pets around you, be aware of where they are.  Review your medicines with your doctor. Some medicines can make you feel dizzy. This can increase your chance of falling. Ask your doctor what other things that you can do to help prevent falls. This information is not intended to replace advice given to you by your health care provider. Make sure you discuss any questions you have with your health care provider. Document Released: 08/29/2009 Document Revised: 04/09/2016 Document Reviewed: 12/07/2014 Elsevier Interactive Patient Education  2017 [employer].

## 2020-02-10 ENCOUNTER — Other Ambulatory Visit: Payer: Self-pay | Admitting: Family Medicine

## 2020-02-10 DIAGNOSIS — F339 Major depressive disorder, recurrent, unspecified: Secondary | ICD-10-CM

## 2020-02-10 NOTE — Telephone Encounter (Signed)
Requested Prescriptions  Pending Prescriptions Disp Refills  . busPIRone (BUSPAR) 5 MG tablet [Pharmacy Med Name: BUSPIRONE HCL 5 MG TABLET] 180 tablet 1    Sig: TAKE 1 TABLET BY MOUTH 2 TIMES DAILY AS NEEDED.     Psychiatry: Anxiolytics/Hypnotics - Non-controlled Passed - 02/10/2020 11:40 AM      Passed - Valid encounter within last 6 months    Recent Outpatient Visits          1 month ago Essential hypertension   [facility] Steele Sizer, MD   7 months ago Essential hypertension   [facility] Steele Sizer, MD   8 months ago Generalized weakness   [facility], NP   1 year ago Major depression, recurrent, chronic [facility])   [facility] Steele Sizer, MD   1 year ago Dyslipidemia   [facility] Steele Sizer, MD      Future Appointments            In 4 months Ancil Boozer, Drue Stager, MD [facility], [address]   In 11 months  [facility], [facility]

## 2020-02-23 DIAGNOSIS — M25561 Pain in right knee: Secondary | ICD-10-CM | POA: Insufficient documentation

## 2020-02-26 ENCOUNTER — Ambulatory Visit: Payer: Medicare HMO

## 2020-02-28 DIAGNOSIS — M1711 Unilateral primary osteoarthritis, right knee: Secondary | ICD-10-CM | POA: Diagnosis not present

## 2020-02-28 DIAGNOSIS — M25561 Pain in right knee: Secondary | ICD-10-CM | POA: Diagnosis not present

## 2020-03-15 ENCOUNTER — Ambulatory Visit: Payer: Medicare HMO | Admitting: Family Medicine

## 2020-03-19 ENCOUNTER — Encounter: Payer: Self-pay | Admitting: Family Medicine

## 2020-03-19 ENCOUNTER — Other Ambulatory Visit: Payer: Self-pay

## 2020-03-19 ENCOUNTER — Ambulatory Visit (INDEPENDENT_AMBULATORY_CARE_PROVIDER_SITE_OTHER): Payer: Medicare HMO | Admitting: Family Medicine

## 2020-03-19 VITALS — BP 130/80 | HR 89 | Temp 97.1°F | Resp 16 | Ht 62.0 in | Wt 140.8 lb

## 2020-03-19 DIAGNOSIS — M1711 Unilateral primary osteoarthritis, right knee: Secondary | ICD-10-CM

## 2020-03-19 DIAGNOSIS — M545 Low back pain, unspecified: Secondary | ICD-10-CM

## 2020-03-19 DIAGNOSIS — Z01818 Encounter for other preprocedural examination: Secondary | ICD-10-CM

## 2020-03-19 LAB — COMPLETE METABOLIC PANEL WITH GFR
AG Ratio: 1.9 (calc) (ref 1.0–2.5)
ALT: 15 U/L (ref 6–29)
AST: 18 U/L (ref 10–35)
Albumin: 4.5 g/dL (ref 3.6–5.1)
Alkaline phosphatase (APISO): 53 U/L (ref 37–153)
BUN: 18 mg/dL (ref 7–25)
CO2: 28 mmol/L (ref 20–32)
Calcium: 9.7 mg/dL (ref 8.6–10.4)
Chloride: 106 mmol/L (ref 98–110)
Creat: 0.85 mg/dL (ref 0.60–0.93)
GFR, Est African American: 76 mL/min/{1.73_m2} (ref >=60)
GFR, Est Non African American: 65 mL/min/{1.73_m2} (ref >=60)
Globulin: 2.4 g/dL (calc) (ref 1.9–3.7)
Glucose, Bld: 91 mg/dL (ref 65–99)
Potassium: 4.7 mmol/L (ref 3.5–5.3)
Sodium: 142 mmol/L (ref 135–146)
Total Bilirubin: 0.5 mg/dL (ref 0.2–1.2)
Total Protein: 6.9 g/dL (ref 6.1–8.1)

## 2020-03-19 LAB — CBC WITH DIFFERENTIAL/PLATELET
Absolute Monocytes: 313 cells/uL (ref 200–950)
Basophils Absolute: 20 cells/uL (ref 0–200)
Basophils Relative: 0.3 %
Eosinophils Absolute: 122 cells/uL (ref 15–500)
Eosinophils Relative: 1.8 %
HCT: 40 % (ref 35.0–45.0)
Hemoglobin: 13.2 g/dL (ref 11.7–15.5)
Lymphs Abs: 2489 cells/uL (ref 850–3900)
MCH: 29 pg (ref 27.0–33.0)
MCHC: 33 g/dL (ref 32.0–36.0)
MCV: 87.9 fL (ref 80.0–100.0)
MPV: 10.6 fL (ref 7.5–12.5)
Monocytes Relative: 4.6 %
Neutro Abs: 3856 cells/uL (ref 1500–7800)
Neutrophils Relative %: 56.7 %
Platelets: 230 10*3/uL (ref 140–400)
RBC: 4.55 10*6/uL (ref 3.80–5.10)
RDW: 12.6 % (ref 11.0–15.0)
Total Lymphocyte: 36.6 %
WBC: 6.8 10*3/uL (ref 3.8–10.8)

## 2020-03-19 MED ORDER — BACLOFEN 10 MG PO TABS: 10.0000 mg | ORAL_TABLET | Freq: Three times a day (TID) | ORAL | 0 refills | Status: DC

## 2020-03-19 NOTE — Progress Notes (Signed)
Name: [name]   MRN: [id number]    DOB: 1941/02/02   Date:03/19/2020       Progress Note  Subjective  Chief Complaint  Chief Complaint  Patient presents with  . Medical Clearance    for surgery for knee replacement on May 18.    HPI  OAright knee: going on for years, used to see Dr. Marlou Sa and has a history of left knee meniscal tear repair,she saw Dr. Marlou Sa 07/2018 and diagnosed with  severe OA but told her there was nothing else she could not for her, so she decided to see someone else.  Since Feb 2021 got progressively worse, she has genu valgum, she was thinking about stem cell and synvisc but because of the severity and progression of pain she decided to have surgery , scheduled from May 18 th 2021 by Dr. Alvan Dame - [facility],  who requested her PO clearance . She denies any reactions to anesthesia, she does not have dentures or partial plates on upper jaw, but has some on lower teeth ( possible implants? ) . She denies decrease in exercise tolerance. No chest pain or palpitation. Last year she saw Dr. Rockey Situ for atypical chest pain and dizziness, but was given reassurance, normal EKG.   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 No radiculitis. No dysuria , bowel or bladder incontinence    Patient Active Problem List   Diagnosis Date Noted  . Senile purpura ([place]) 12/30/2018  . S/P vaginal hysterectomy 05/30/2018  . Recurrent major depressive disorder, in full remission ([place]) 03/02/2018  . GERD (gastroesophageal reflux disease) 10/20/2016  . Anxiety, generalized 01/28/2016  . Osteoarthritis of right knee 12/25/2015  . Depression 09/05/2015  . Abnormal brain CT 06/20/2015  . Anxiety, mild 06/20/2015  . Brain lesion 06/20/2015  . Clinical depression 06/20/2015  . Dyslipidemia 06/20/2015  .  Hyperlipidemia LDL goal <100 06/20/2015  . Benign hypertension 06/20/2015  . Affective disorder, major 06/20/2015  . Arthralgia of shoulder 06/20/2015  . Glaucoma 06/20/2015  . Post-menopausal 06/04/2015  . Bilateral leg weakness 06/04/2015    Past Surgical History:  Procedure Laterality Date  . ANAL FISSURE REPAIR  1970s   for bleeding in 1970s  . ANTERIOR AND POSTERIOR REPAIR WITH SACROSPINOUS FIXATION N/A 05/30/2018   Procedure: ANTERIOR AND POSTERIOR REPAIR;  Surgeon: Louretta Shorten, MD;  Location: [facility];  Service: Gynecology;  Laterality: N/A;  . COLONOSCOPY  2012   [place]  . COLONOSCOPY WITH PROPOFOL N/A 09/01/2016   Procedure: COLONOSCOPY WITH PROPOFOL;  Surgeon: Robert Bellow, MD;  Location: [facility]COPY;  Service: Endoscopy;  Laterality: N/A;  . EYE SURGERY Bilateral    cataracts removed  . KNEE SURGERY Left   . SALPINGOOPHORECTOMY Right 05/30/2018   Procedure: SALPINGO OOPHORECTOMY;  Surgeon: Louretta Shorten, MD;  Location: [facility];  Service: Gynecology;  Laterality: Right;  . SUBCLAVIAN ANGIOGRAM    . UPPER GI ENDOSCOPY  2012  . VAGINAL HYSTERECTOMY N/A 05/30/2018   Procedure: HYSTERECTOMY VAGINAL;  Surgeon: Louretta Shorten, MD;  Location: [facility];  Service: Gynecology;  Laterality: N/A;  . WISDOM TOOTH EXTRACTION      Family History  Problem Relation Age of Onset  . Cancer Mother 68       colon  . Alzheimer's disease Mother   . Hypertension  Mother   . Cancer Brother        prostate  . Lymphoma Son   . Cancer Son 48       lymphoma  . Hypertension Father   . Cerebral aneurysm Father   . Diabetes Sister   . Depression Sister   . Hypertension Sister   . Depression Sister   . Cancer Brother   . Gallbladder disease Brother   . Prostate cancer Brother   . Breast cancer Neg Hx     Social History   Tobacco Use  . Smoking status: Never Smoker  . Smokeless tobacco: Never Used  . Tobacco comment: smoking cessation materials not required  Substance Use Topics  .  Alcohol use: Yes    Alcohol/week: 0.0 standard drinks    Comment: occasional beer/mixed drink     Current Outpatient Medications:  .  acetaminophen (TYLENOL) 500 MG tablet, Take 1,000 mg by mouth every 6 (six) hours as needed for moderate pain or headache., Disp: , Rfl:  .  ALPHAGAN P 0.1 % SOLN, Place 1 drop into both eyes 3 (three) times daily. , Disp: , Rfl:  .  bimatoprost (LUMIGAN) 0.01 % SOLN, Place 1 drop into both eyes at bedtime., Disp: , Rfl:  .  Biotin 10000 MCG TABS, Take 10,000 mcg by mouth daily. , Disp: , Rfl:  .  calcium carbonate (TUMS - DOSED IN MG ELEMENTAL CALCIUM) 500 MG chewable tablet, Chew 1,000 mg by mouth daily as needed for indigestion or heartburn., Disp: , Rfl:  .  famotidine (PEPCID) 40 MG tablet, TAKE 1 TABLET (40 MG TOTAL) BY MOUTH DAILY AS NEEDED FOR HEARTBURN OR INDIGESTION., Disp: 90 tablet, Rfl: 0 .  losartan (COZAAR) 25 MG tablet, Take 1 tablet (25 mg total) by mouth daily., Disp: 90 tablet, Rfl: 1 .  meclizine (ANTIVERT) 12.5 MG tablet, Take 1 tablet (12.5 mg total) by mouth 3 (three) times daily as needed for dizziness., Disp: 10 tablet, Rfl: 0 .  Polyethyl Glycol-Propyl Glycol (SYSTANE OP), Place 1 drop into both eyes as needed (dry eyes)., Disp: , Rfl:  .  pravastatin (PRAVACHOL) 20 MG tablet, Take 1 tablet (20 mg total) by mouth daily., Disp: 90 tablet, Rfl: 1 .  PREMARIN vaginal cream, Place 1 application vaginally 2 (two) times a week., Disp: , Rfl: 12 .  White Petrolatum-Mineral Oil (SYSTANE NIGHTTIME) OINT, Place 1 application into both eyes at bedtime as needed (dry eyes)., Disp: , Rfl:  .  busPIRone (BUSPAR) 5 MG tablet, TAKE 1 TABLET BY MOUTH 2 TIMES DAILY AS NEEDED. (Patient not taking: Reported on 03/19/2020), Disp: 180 tablet, Rfl: 1 .  sodium chloride (OCEAN) 0.65 % SOLN nasal spray, Place 1 spray into both nostrils as needed for congestion., Disp: , Rfl:   Allergies  Allergen Reactions  . Codeine     headaches  . Combigan [Brimonidine  Tartrate-Timolol]     drowsy  . Sulfa Antibiotics Nausea And Vomiting  . Metronidazole Itching and Rash    I personally reviewed active problem list, medication list, allergies, family history, social history, health maintenance with the patient/caregiver today.   ROS  Ten systems reviewed and is negative except as mentioned in HPI   Objective  Vitals:   03/19/20 1406  BP: 130/80  Pulse: 89  Resp: 16  Temp: (!) 97.1 F (36.2 C)  TempSrc: Temporal  SpO2: 96%  Weight: 140 lb 12.8 oz (63.9 kg)  Height: 5\' 2"  (1.575 m)    Body mass  index is 25.75 kg/m.  Physical Exam  Constitutional: Patient appears well-developed and well-nourished. No distress.  HEENT: head atraumatic, normocephalic, pupils equal and reactive to light Cardiovascular: Normal rate, regular rhythm and normal heart sounds.  No murmur heard. No BLE edema. Pulmonary/Chest: Effort normal and breath sounds normal. No respiratory distress. Abdominal: Soft.  There is no tenderness. Muscular skeletal: slow to get up from chair, genu valgum, crepitus with extension of knees  Psychiatric: Patient has a normal mood and affect. behavior is normal. Judgment and thought content normal.  Recent Results (from the past 2160 hour(s))  Lipid panel     Status: Abnormal   Collection Time: 01/01/20  4:17 PM  Result Value Ref Range   Cholesterol 178 <200 mg/dL   HDL 60 > OR = 50 mg/dL   Triglycerides 250 (H) <150 mg/dL    Comment: . If a non-fasting specimen was collected, consider repeat triglyceride testing on a fasting specimen if clinically indicated.  Yates Decamp et al. J. of Clin. Lipidol. [id number]. Marland Kitchen    LDL Cholesterol (Calc) 85 mg/dL (calc)    Comment: Reference range: <100 . Desirable range <100 mg/dL for primary prevention;   <70 mg/dL for patients with CHD or diabetic patients  with > or = 2 CHD risk factors. Marland Kitchen LDL-C is now calculated using the Martin-Hopkins  calculation, which is a validated novel  method providing  better accuracy than the Friedewald equation in the  estimation of LDL-C.  Cresenciano Genre et al. Annamaria Helling. [id number]): 2061-2068  (http://education.[facility].com/faq/FAQ164)    Total CHOL/HDL Ratio 3.0 <5.0 (calc)   Non-HDL Cholesterol (Calc) 118 <130 mg/dL (calc)    Comment: For patients with diabetes plus 1 major ASCVD risk  factor, treating to a non-HDL-C goal of <100 mg/dL  (LDL-C of <70 mg/dL) is considered a therapeutic  option.   COMPLETE METABOLIC PANEL WITH GFR     Status: None   Collection Time: 01/01/20  4:17 PM  Result Value Ref Range   Glucose, Bld 97 65 - 99 mg/dL    Comment: .            Fasting reference interval .    BUN 12 7 - 25 mg/dL   Creat 0.83 0.60 - 0.93 mg/dL    Comment: For patients >22 years of age, the reference limit for Creatinine is approximately 13% higher for people identified as African-American. .    GFR, Est Non African American 67 > OR = 60 mL/min/1.71m2   GFR, Est African American 78 > OR = 60 mL/min/1.71m2   BUN/Creatinine Ratio NOT APPLICABLE 6 - 22 (calc)   Sodium 146 135 - 146 mmol/L   Potassium 5.3 3.5 - 5.3 mmol/L   Chloride 108 98 - 110 mmol/L   CO2 27 20 - 32 mmol/L   Calcium 10.4 8.6 - 10.4 mg/dL   Total Protein 7.4 6.1 - 8.1 g/dL   Albumin 4.6 3.6 - 5.1 g/dL   Globulin 2.8 1.9 - 3.7 g/dL (calc)   AG Ratio 1.6 1.0 - 2.5 (calc)   Total Bilirubin 0.4 0.2 - 1.2 mg/dL   Alkaline phosphatase (APISO) 51 37 - 153 U/L   AST 21 10 - 35 U/L   ALT 23 6 - 29 U/L  Hemoglobin A1c     Status: None   Collection Time: 01/01/20  4:17 PM  Result Value Ref Range   Hgb A1c MFr Bld 5.4 <5.7 % of total Hgb    Comment: For the purpose of screening for  the presence of diabetes: . <5.7%       Consistent with the absence of diabetes 5.7-6.4%    Consistent with increased risk for diabetes             (prediabetes) > or =6.5%  Consistent with diabetes . This assay result is consistent with a decreased risk of  diabetes. . Currently, no consensus exists regarding use of hemoglobin A1c for diagnosis of diabetes in children. . According to American Diabetes Association (ADA) guidelines, hemoglobin A1c <7.0% represents optimal control in non-pregnant diabetic patients. Different metrics may apply to specific patient populations.  Standards of Medical Care in Diabetes(ADA). .    Mean Plasma Glucose 108 (calc)   eAG (mmol/L) 6.0 (calc)      PHQ2/9: Depression screen [facility] 2/9 03/19/2020 01/25/2020 01/01/2020 06/30/2019 06/02/2019  Decreased Interest 0 0 0 0 0  Down, Depressed, Hopeless 0 0 0 0 0  PHQ - 2 Score 0 0 0 0 0  Altered sleeping 0 - 0 0 0  Tired, decreased energy 0 - 2 0 0  Change in appetite 0 - 0 0 0  Feeling bad or failure about yourself  0 - 0 0 0  Trouble concentrating 0 - 0 0 0  Moving slowly or fidgety/restless 0 - 0 0 0  Suicidal thoughts 0 - 0 0 0  PHQ-9 Score 0 - 2 0 0  Difficult doing work/chores - - Not difficult at all - Not difficult at all  Some recent data might be hidden    phq 9 is negative   Fall Risk: Fall Risk  03/19/2020 01/25/2020 01/01/2020 06/30/2019 06/02/2019  Falls in the past year? 0 0 0 0 0  Comment - - - - -  Number falls in past yr: 0 0 0 0 0  Injury with Fall? 0 0 0 0 0  Risk for fall due to : - No Fall Risks - - -  Risk for fall due to: Comment - - - - -  Follow up - Falls prevention discussed - - -      Functional Status Survey: Is the patient deaf or have difficulty hearing?: No Does the patient have difficulty seeing, even when wearing glasses/contacts?: No Does the patient have difficulty concentrating, remembering, or making decisions?: No Does the patient have difficulty walking or climbing stairs?: No Does the patient have difficulty dressing or bathing?: No Does the patient have difficulty doing errands alone such as visiting a doctor's office or shopping?: No    Assessment & Plan   1. Primary osteoarthritis of right knee  May  proceed to surgery without further testing, reviewed labs done in Feb, we will check CBC and comp panel per pre op form requested    2. Acute right-sided low back pain without sciatica  - baclofen (LIORESAL) 10 MG tablet; Take 1 tablet (10 mg total) by mouth 3 (three) times daily.  Dispense: 30 each; Refill: 0  3. Pre-operative clearance  - CBC with Differential/Platelet - COMPLETE METABOLIC PANEL WITH GFR  Rechecked on pre-op form

## 2020-03-22 NOTE — Patient Instructions (Addendum)
DUE TO COVID-19 ONLY ONE VISITOR IS ALLOWED TO COME WITH YOU AND STAY IN THE WAITING ROOM ONLY DURING PRE OP AND PROCEDURE DAY OF SURGERY. THE 1 VISITOR MAY VISIT WITH YOU AFTER SURGERY IN YOUR PRIVATE ROOM DURING VISITING HOURS ONLY!  YOU NEED TO HAVE A COVID 19 TEST ON 03-29-20 @ 2:05 PM. THIS TEST MUST BE DONE BEFORE SURGERY, COME  [place], [place] , [address].  ([name]) ONCE YOUR COVID TEST IS COMPLETED, PLEASE BEGIN THE QUARANTINE INSTRUCTIONS AS OUTLINED IN YOUR HANDOUT.                [name]  03/22/2020   Your procedure is scheduled on: 04-02-20    Report to [facility] Main  Entrance    Report to Admitting at 5:30 AM     Call this number if you have problems the morning of surgery [phone]    Remember: AFTER MIDNIGHT THE NIGHT PRIOR TO SURGERY. NOTHING BY MOUTH EXCEPT CLEAR LIQUIDS UNTIL 4:15 AM. PLEASE FINISH ENSURE DRINK PER SURGEON ORDER  WHICH NEEDS TO BE COMPLETED AT 4:15 AM .   CLEAR LIQUID DIET   Foods Allowed                                                                     Foods Excluded  Coffee and tea, regular and decaf                             liquids that you cannot  Plain Jell-O any favor except red or purple                                           see through such as: Fruit ices (not with fruit pulp)                                     milk, soups, orange juice  Iced Popsicles                                    All solid food Carbonated beverages, regular and diet                                    Cranberry, grape and apple juices Sports drinks like Gatorade Lightly seasoned clear broth or consume(fat free) Sugar, honey syrup  _____________________________________________________________________     Take these medicines the morning of surgery with A SIP OF WATER: None. You may use your eyedrops.   BRUSH YOUR TEETH MORNING OF SURGERY AND RINSE YOUR MOUTH OUT, NO CHEWING GUM CANDY OR MINTS.                              You may not have any metal on your body including hair pins and  piercings     Do not wear jewelry, make-up, lotions, powders or perfumes, deodorant              Do not wear nail polish on your fingernails.  Do not shave 48 hours prior to surgery.                Do not bring valuables to the hospital. [place].  Contacts, dentures or bridgework may not be worn into surgery.  You may bring an small overnight bag     Special Instructions: N/A              Please read over the following fact sheets you were given: _____________________________________________________________________             [facility] - Preparing for Surgery Before surgery, you can play an important role.  Because skin is not sterile, your skin needs to be as free of germs as possible.  You can reduce the number of germs on your skin by washing with CHG (chlorahexidine gluconate) soap before surgery.  CHG is an antiseptic cleaner which kills germs and bonds with the skin to continue killing germs even after washing. Please DO NOT use if you have an allergy to CHG or antibacterial soaps.  If your skin becomes reddened/irritated stop using the CHG and inform your nurse when you arrive at Short Stay. Do not shave (including legs and underarms) for at least 48 hours prior to the first CHG shower.  You may shave your face/neck. Please follow these instructions carefully:  1.  Shower with CHG Soap the night before surgery and the  morning of Surgery.  2.  If you choose to wash your hair, wash your hair first as usual with your  normal  shampoo.  3.  After you shampoo, rinse your hair and body thoroughly to remove the  shampoo.                           4.  Use CHG as you would any other liquid soap.  You can apply chg directly  to the skin and wash                       Gently with a scrungie or clean washcloth.  5.  Apply the CHG Soap  to your body ONLY FROM THE NECK DOWN.   Do not use on face/ open                           Wound or open sores. Avoid contact with eyes, ears mouth and genitals (private parts).                       Wash face,  Genitals (private parts) with your normal soap.             6.  Wash thoroughly, paying special attention to the area where your surgery  will be performed.  7.  Thoroughly rinse your body with warm water from the neck down.  8.  DO NOT shower/wash with your normal soap after using and rinsing off  the CHG Soap.                9.  Pat yourself dry with a clean  towel.            10.  Wear clean pajamas.            11.  Place clean sheets on your bed the night of your first shower and do not  sleep with pets. Day of Surgery : Do not apply any lotions/deodorants the morning of surgery.  Please wear clean clothes to the hospital/surgery center.  FAILURE TO FOLLOW THESE INSTRUCTIONS MAY RESULT IN THE CANCELLATION OF YOUR SURGERY PATIENT SIGNATURE_________________________________  NURSE SIGNATURE__________________________________  ________________________________________________________________________   Adam Phenix  An incentive spirometer is a tool that can help keep your lungs clear and active. This tool measures how well you are filling your lungs with each breath. Taking long deep breaths may help reverse or decrease the chance of developing breathing (pulmonary) problems (especially infection) following:  A long period of time when you are unable to move or be active. BEFORE THE PROCEDURE   If the spirometer includes an indicator to show your best effort, your nurse or respiratory therapist will set it to a desired goal.  If possible, sit up straight or lean slightly forward. Try not to slouch.  Hold the incentive spirometer in an upright position. INSTRUCTIONS FOR USE  1. Sit on the edge of your bed if possible, or sit up as far as you can in bed or on a  chair. 2. Hold the incentive spirometer in an upright position. 3. Breathe out normally. 4. Place the mouthpiece in your mouth and seal your lips tightly around it. 5. Breathe in slowly and as deeply as possible, raising the piston or the ball toward the top of the column. 6. Hold your breath for 3-5 seconds or for as long as possible. Allow the piston or ball to fall to the bottom of the column. 7. Remove the mouthpiece from your mouth and breathe out normally. 8. Rest for a few seconds and repeat Steps 1 through 7 at least 10 times every 1-2 hours when you are awake. Take your time and take a few normal breaths between deep breaths. 9. The spirometer may include an indicator to show your best effort. Use the indicator as a goal to work toward during each repetition. 10. After each set of 10 deep breaths, practice coughing to be sure your lungs are clear. If you have an incision (the cut made at the time of surgery), support your incision when coughing by placing a pillow or rolled up towels firmly against it. Once you are able to get out of bed, walk around indoors and cough well. You may stop using the incentive spirometer when instructed by your caregiver.  RISKS AND COMPLICATIONS  Take your time so you do not get dizzy or light-headed.  If you are in pain, you may need to take or ask for pain medication before doing incentive spirometry. It is harder to take a deep breath if you are having pain. AFTER USE  Rest and breathe slowly and easily.  It can be helpful to keep track of a log of your progress. Your caregiver can provide you with a simple table to help with this. If you are using the spirometer at home, follow these instructions: [place] IF:   You are having difficultly using the spirometer.  You have trouble using the spirometer as often as instructed.  Your pain medication is not giving enough relief while using the spirometer.  You develop fever of 100.5 F  (38.1 C) or  higher. SEEK IMMEDIATE MEDICAL CARE IF:   You cough up bloody sputum that had not been present before.  You develop fever of 102 F (38.9 C) or greater.  You develop worsening pain at or near the incision site. MAKE SURE YOU:   Understand these instructions.  Will watch your condition.  Will get help right away if you are not doing well or get worse. Document Released: 03/15/2007 Document Revised: 01/25/2012 Document Reviewed: 05/16/2007 ExitCare Patient Information 2014 Exit[employer], [place].   ________________________________________________________________________  WHAT IS A BLOOD TRANSFUSION? Blood Transfusion Information  A transfusion is the replacement of blood or some of its parts. Blood is made up of multiple cells which provide different functions.  Red blood cells carry oxygen and are used for blood loss replacement.  White blood cells fight against infection.  Platelets control bleeding.  Plasma helps clot blood.  Other blood products are available for specialized needs, such as hemophilia or other clotting disorders. BEFORE THE TRANSFUSION  Who gives blood for transfusions?   Healthy volunteers who are fully evaluated to make sure their blood is safe. This is blood bank blood. Transfusion therapy is the safest it has ever been in the practice of medicine. Before blood is taken from a donor, a complete history is taken to make sure that person has no history of diseases nor engages in risky social behavior (examples are intravenous drug use or sexual activity with multiple partners). The donor's travel history is screened to minimize risk of transmitting infections, such as malaria. The donated blood is tested for signs of infectious diseases, such as HIV and hepatitis. The blood is then tested to be sure it is compatible with you in order to minimize the chance of a transfusion reaction. If you or a relative donates blood, this is often done in anticipation  of surgery and is not appropriate for emergency situations. It takes many days to process the donated blood. RISKS AND COMPLICATIONS Although transfusion therapy is very safe and saves many lives, the main dangers of transfusion include:   Getting an infectious disease.  Developing a transfusion reaction. This is an allergic reaction to something in the blood you were given. Every precaution is taken to prevent this. The decision to have a blood transfusion has been considered carefully by your caregiver before blood is given. Blood is not given unless the benefits outweigh the risks. AFTER THE TRANSFUSION  Right after receiving a blood transfusion, you will usually feel much better and more energetic. This is especially true if your red blood cells have gotten low (anemic). The transfusion raises the level of the red blood cells which carry oxygen, and this usually causes an energy increase.  The nurse administering the transfusion will monitor you carefully for complications. HOME CARE INSTRUCTIONS  No special instructions are needed after a transfusion. You may find your energy is better. Speak with your caregiver about any limitations on activity for underlying diseases you may have. SEEK MEDICAL CARE IF:   Your condition is not improving after your transfusion.  You develop redness or irritation at the intravenous (IV) site. SEEK IMMEDIATE MEDICAL CARE IF:  Any of the following symptoms occur over the next 12 hours:  Shaking chills.  You have a temperature by mouth above 102 F (38.9 C), not controlled by medicine.  Chest, back, or muscle pain.  People around you feel you are not acting correctly or are confused.  Shortness of breath or difficulty breathing.  Dizziness and fainting.  You get a rash or develop hives.  You have a decrease in urine output.  Your urine turns a dark color or changes to pink, red, or brown. Any of the following symptoms occur over the next 10  days:  You have a temperature by mouth above 102 F (38.9 C), not controlled by medicine.  Shortness of breath.  Weakness after normal activity.  The white part of the eye turns yellow (jaundice).  You have a decrease in the amount of urine or are urinating less often.  Your urine turns a dark color or changes to pink, red, or brown. Document Released: 10/30/2000 Document Revised: 01/25/2012 Document Reviewed: 06/18/2008 [facility] Patient Information 2014 [place], [place].  _______________________________________________________________________

## 2020-03-22 NOTE — Progress Notes (Signed)
PCP - Steele Sizer, MD w/ surgical clearance 03-19-20 (Epic) Cardiologist -   Chest x-ray -  EKG - 06-05-19 Stress Test -  ECHO -  Cardiac Cath -   Sleep Study -  CPAP -   Fasting Blood Sugar -  Checks Blood Sugar _____ times a day  Blood Thinner Instructions: N/A Aspirin Instructions: Last Dose:  Anesthesia review:   Patient denies shortness of breath, fever, cough and chest pain at PAT appointment   Patient verbalized understanding of instructions that were given to them at the PAT appointment. Patient was also instructed that they will need to review over the PAT instructions again at home before surgery.

## 2020-03-26 ENCOUNTER — Other Ambulatory Visit: Payer: Self-pay

## 2020-03-26 ENCOUNTER — Encounter (HOSPITAL_COMMUNITY)
Admission: RE | Admit: 2020-03-26 | Discharge: 2020-03-26 | Disposition: A | Discharge status: Home / Self Care | Payer: Medicare HMO | Source: Ambulatory Visit | Attending: Orthopedic Surgery | Admitting: Orthopedic Surgery

## 2020-03-26 ENCOUNTER — Encounter (HOSPITAL_COMMUNITY): Payer: Self-pay

## 2020-03-26 DIAGNOSIS — Z01812 Encounter for preprocedural laboratory examination: Secondary | ICD-10-CM | POA: Diagnosis not present

## 2020-03-26 HISTORY — DX: Other complications of anesthesia, initial encounter: T88.59XA

## 2020-03-26 LAB — SURGICAL PCR SCREEN
MRSA, PCR: NEGATIVE
Staphylococcus aureus: NEGATIVE

## 2020-03-26 LAB — ABO/RH: ABO/RH(D): A POS

## 2020-03-29 ENCOUNTER — Other Ambulatory Visit (HOSPITAL_COMMUNITY)
Admission: RE | Admit: 2020-03-29 | Discharge: 2020-03-29 | Disposition: A | Discharge status: Home / Self Care | Payer: Medicare HMO | Source: Ambulatory Visit | Attending: Orthopedic Surgery | Admitting: Orthopedic Surgery

## 2020-03-29 DIAGNOSIS — Z20822 Contact with and (suspected) exposure to covid-19: Secondary | ICD-10-CM | POA: Insufficient documentation

## 2020-03-29 DIAGNOSIS — Z01812 Encounter for preprocedural laboratory examination: Secondary | ICD-10-CM | POA: Insufficient documentation

## 2020-03-29 LAB — SARS CORONAVIRUS 2 (TAT 6-24 HRS): SARS Coronavirus 2: NEGATIVE

## 2020-03-29 NOTE — H&P (Signed)
TOTAL KNEE ADMISSION H&P  Patient is being admitted for right total knee arthroplasty.  Subjective:  Chief Complaint:  Right knee primary OA / pain  HPI: [name], 79 y.o. female, has a history of pain and functional disability in the right knee due to arthritis and has failed non-surgical conservative treatments for greater than 12 weeks to include NSAID's and/or analgesics and activity modification.  Onset of symptoms was gradual, starting  years ago with gradually worsening course since that time. The patient noted no past surgery on the right knee(s).  Patient currently rates pain in the right knee(s) at 9 out of 10 with activity. Patient has worsening of pain with activity and weight bearing, pain that interferes with activities of daily living, pain with passive range of motion, crepitus and joint swelling.  Patient has evidence of periarticular osteophytes and joint space narrowing by imaging studies.  There is no active infection.  Risks, benefits and expectations were discussed with the patient.  Risks including but not limited to the risk of anesthesia, blood clots, nerve damage, blood vessel damage, failure of the prosthesis, infection and up to and including death.  Patient understand the risks, benefits and expectations and wishes to proceed with surgery.   PCP: Steele Sizer, MD  D/C Plans:       Home   Post-op Meds:       No Rx given   Tranexamic Acid:      To be given - IV   Decadron:      Is to be given  FYI:     ASA  Dilaudid (allergy to Codeine)  DME:   Pt equipment arraanged  PT:   OPPT  Pharmacy: [facility] [address]   Patient Active Problem List   Diagnosis Date Noted  . Senile purpura ([place]) 12/30/2018  . S/P vaginal hysterectomy 05/30/2018  . Recurrent major depressive disorder, in full remission ([place]) 03/02/2018  . GERD (gastroesophageal reflux disease) 10/20/2016  . Anxiety, generalized 01/28/2016  . Osteoarthritis of right knee  12/25/2015  . Depression 09/05/2015  . Abnormal brain CT 06/20/2015  . Anxiety, mild 06/20/2015  . Brain lesion 06/20/2015  . Clinical depression 06/20/2015  . Dyslipidemia 06/20/2015  . Hyperlipidemia LDL goal <100 06/20/2015  . Benign hypertension 06/20/2015  . Affective disorder, major 06/20/2015  . Arthralgia of shoulder 06/20/2015  . Glaucoma 06/20/2015  . Post-menopausal 06/04/2015  . Bilateral leg weakness 06/04/2015   Past Medical History:  Diagnosis Date  . Anemia   . Anxiety    no meds  . Arthritis    hands, knees - no meds  . Bilateral leg weakness   . Complication of anesthesia    Difficult to arouse  . Dental bridge present    perm lower front bottom bridge  . Depression    no meds  . Eyes swollen    and red - was seen by MD for possible allergy to new eye drops  . GERD (gastroesophageal reflux disease)    diet controlled - only takes med prn basis  . Glaucoma   . Hypercholesteremia   . Hypertension   . SVD (spontaneous vaginal delivery)    x 3 - only one currently living  . Vertigo     Past Surgical History:  Procedure Laterality Date  . ANAL FISSURE REPAIR  1970s   for bleeding in 1970s  . ANTERIOR AND POSTERIOR REPAIR WITH SACROSPINOUS FIXATION N/A 05/30/2018   Procedure: ANTERIOR AND POSTERIOR  REPAIR;  Surgeon: Louretta Shorten, MD;  Location: [facility];  Service: Gynecology;  Laterality: N/A;  . COLONOSCOPY  2012   [place]  . COLONOSCOPY WITH PROPOFOL N/A 09/01/2016   Procedure: COLONOSCOPY WITH PROPOFOL;  Surgeon: Robert Bellow, MD;  Location: [facility] ENDOSCOPY;  Service: Endoscopy;  Laterality: N/A;  . EYE SURGERY Bilateral    cataracts removed  . KNEE SURGERY Left   . SALPINGOOPHORECTOMY Right 05/30/2018   Procedure: SALPINGO OOPHORECTOMY;  Surgeon: Louretta Shorten, MD;  Location: [facility];  Service: Gynecology;  Laterality: Right;  . SUBCLAVIAN ANGIOGRAM    . UPPER GI ENDOSCOPY  2012  . VAGINAL HYSTERECTOMY N/A 05/30/2018   Procedure: HYSTERECTOMY  VAGINAL;  Surgeon: Louretta Shorten, MD;  Location: [facility];  Service: Gynecology;  Laterality: N/A;  . WISDOM TOOTH EXTRACTION      No current facility-administered medications for this encounter.   Current Outpatient Medications  Medication Sig Dispense Refill Last Dose  . acetaminophen (TYLENOL) 500 MG tablet Take 1,000 mg by mouth every 6 (six) hours as needed for moderate pain or headache.     . ALPHAGAN P 0.1 % SOLN Place 1 drop into both eyes 3 (three) times daily.      . bimatoprost (LUMIGAN) 0.01 % SOLN Place 1 drop into both eyes at bedtime.     . Biotin 10000 MCG TABS Take 10,000 mcg by mouth daily.      . calcium carbonate (TUMS - DOSED IN MG ELEMENTAL CALCIUM) 500 MG chewable tablet Chew 1,000 mg by mouth daily as needed for indigestion or heartburn.     . famotidine (PEPCID) 40 MG tablet TAKE 1 TABLET (40 MG TOTAL) BY MOUTH DAILY AS NEEDED FOR HEARTBURN OR INDIGESTION. 90 tablet 0   . losartan (COZAAR) 25 MG tablet Take 1 tablet (25 mg total) by mouth daily. 90 tablet 1   . meclizine (ANTIVERT) 12.5 MG tablet Take 1 tablet (12.5 mg total) by mouth 3 (three) times daily as needed for dizziness. 10 tablet 0   . Polyethyl Glycol-Propyl Glycol (SYSTANE OP) Place 1 drop into both eyes as needed (dry eyes).     . pravastatin (PRAVACHOL) 20 MG tablet Take 1 tablet (20 mg total) by mouth daily. 90 tablet 1   . PREMARIN vaginal cream Place 1 application vaginally 2 (two) times a week.  12   . sodium chloride (OCEAN) 0.65 % SOLN nasal spray Place 1 spray into both nostrils as needed for congestion.     Dema Severin Petrolatum-Mineral Oil (SYSTANE NIGHTTIME) OINT Place 1 application into both eyes at bedtime as needed (dry eyes).     . baclofen (LIORESAL) 10 MG tablet Take 1 tablet (10 mg total) by mouth 3 (three) times daily. 30 each 0    Allergies  Allergen Reactions  . Codeine     headaches  . Combigan [Brimonidine Tartrate-Timolol]     drowsy  . Sulfa Antibiotics Nausea And Vomiting  .  Metronidazole Itching and Rash    Social History   Tobacco Use  . Smoking status: Never Smoker  . Smokeless tobacco: Never Used  . Tobacco comment: smoking cessation materials not required  Substance Use Topics  . Alcohol use: Yes    Alcohol/week: 0.0 standard drinks    Comment: occasional beer/mixed drink    Family History  Problem Relation Age of Onset  . Cancer Mother 89       colon  . Alzheimer's disease Mother   . Hypertension Mother   .  Cancer Brother        prostate  . Lymphoma Son   . Cancer Son 4       lymphoma  . Hypertension Father   . Cerebral aneurysm Father   . Diabetes Sister   . Depression Sister   . Hypertension Sister   . Depression Sister   . Cancer Brother   . Gallbladder disease Brother   . Prostate cancer Brother   . Breast cancer Neg Hx      Review of Systems  Constitutional: Negative.   HENT: Negative.   Eyes: Negative.   Respiratory: Negative.   Cardiovascular: Negative.   Gastrointestinal: Positive for heartburn.  Genitourinary: Negative.   Musculoskeletal: Positive for joint pain.  Skin: Negative.   Neurological: Positive for dizziness.  Endo/Heme/Allergies: Negative.   Psychiatric/Behavioral: Negative.       Objective:  Physical Exam  Constitutional: She is oriented to person, place, and time. She appears well-developed.  HENT:  Head: Normocephalic.  Eyes: Pupils are equal, round, and reactive to light.  Neck: No JVD present. No tracheal deviation present. No thyromegaly present.  Cardiovascular: Normal rate, regular rhythm and intact distal pulses.  Respiratory: Effort normal and breath sounds normal. No respiratory distress. She has no wheezes.  GI: Soft. There is no abdominal tenderness. There is no guarding.  Musculoskeletal:     Cervical back: Neck supple.  Lymphadenopathy:    She has no cervical adenopathy.  Neurological: She is alert and oriented to person, place, and time.  Skin: Skin is warm and dry.   Psychiatric: She has a normal mood and affect.     Labs:  Estimated body mass index is 26.06 kg/m as calculated from the following:   Height as of 03/26/20: 5\' 2"  (1.575 m).   Weight as of 03/26/20: 64.6 kg.   Imaging Review Plain radiographs demonstrate severe degenerative joint disease of the right knee(s). The overall alignment issignificant valgus. The bone quality appears to be good for age and reported activity level.      Assessment/Plan:  End stage arthritis, right knee   The patient history, physical examination, clinical judgment of the provider and imaging studies are consistent with end stage degenerative joint disease of the right knee(s) and total knee arthroplasty is deemed medically necessary. The treatment options including medical management, injection therapy arthroscopy and arthroplasty were discussed at length. The risks and benefits of total knee arthroplasty were presented and reviewed. The risks due to aseptic loosening, infection, stiffness, patella tracking problems, thromboembolic complications and other imponderables were discussed. The patient acknowledged the explanation, agreed to proceed with the plan and consent was signed. Patient is being admitted for inpatient treatment for surgery, pain control, PT, OT, prophylactic antibiotics, VTE prophylaxis, progressive ambulation and ADL's and discharge planning. The patient is planning to be discharged home.     Patient's anticipated LOS is less than 2 midnights, meeting these requirements: - Lives within 1 hour of care - Has a competent adult at home to recover with post-op recover - NO history of  - Chronic pain requiring opiods  - Diabetes  - Coronary Artery Disease  - Heart failure  - Heart attack  - Stroke  - DVT/VTE  - Cardiac arrhythmia  - Respiratory Failure/COPD  - Renal failure  - Anemia  - Advanced Liver disease    West Pugh. Arris Meyn   PA-C  03/29/2020, 6:10 PM

## 2020-04-01 NOTE — Anesthesia Preprocedure Evaluation (Addendum)
Anesthesia Evaluation  Patient identified by MRN, date of birth, ID band Patient awake    Reviewed: Allergy & Precautions, NPO status , Patient's Chart, lab work & pertinent test results  Airway Mallampati: II  TM Distance: >3 FB Neck ROM: Full    Dental no notable dental hx.    Pulmonary neg pulmonary ROS,    Pulmonary exam normal breath sounds clear to auscultation       Cardiovascular hypertension, Normal cardiovascular exam Rhythm:Regular Rate:Normal     Neuro/Psych negative neurological ROS  negative psych ROS   GI/Hepatic negative GI ROS, Neg liver ROS,   Endo/Other  negative endocrine ROS  Renal/GU negative Renal ROS  negative genitourinary   Musculoskeletal  (+) Arthritis , Osteoarthritis,    Abdominal   Peds negative pediatric ROS (+)  Hematology negative hematology ROS (+)   Anesthesia Other Findings   Reproductive/Obstetrics negative OB ROS                            Anesthesia Physical Anesthesia Plan  ASA: II  Anesthesia Plan: Spinal   Post-op Pain Management:  Regional for Post-op pain   Induction: Intravenous  PONV Risk Score and Plan: 2 and Ondansetron and Dexamethasone  Airway Management Planned: Simple Face Mask  Additional Equipment:   Intra-op Plan:   Post-operative Plan:   Informed Consent: I have reviewed the patients History and Physical, chart, labs and discussed the procedure including the risks, benefits and alternatives for the proposed anesthesia with the patient or authorized representative who has indicated his/her understanding and acceptance.     Dental advisory given  Plan Discussed with: CRNA and Surgeon  Anesthesia Plan Comments:         Anesthesia Quick Evaluation

## 2020-04-02 ENCOUNTER — Encounter (HOSPITAL_COMMUNITY): Payer: Self-pay | Admitting: Orthopedic Surgery

## 2020-04-02 ENCOUNTER — Encounter (HOSPITAL_COMMUNITY)
Admission: RE | Disposition: A | Discharge status: Home / Self Care | Payer: Self-pay | Source: Ambulatory Visit | Attending: Orthopedic Surgery

## 2020-04-02 ENCOUNTER — Other Ambulatory Visit: Payer: Self-pay

## 2020-04-02 ENCOUNTER — Ambulatory Visit (HOSPITAL_COMMUNITY): Payer: Medicare HMO | Admitting: Anesthesiology

## 2020-04-02 ENCOUNTER — Observation Stay (HOSPITAL_COMMUNITY)
Admission: RE | Admit: 2020-04-02 | Discharge: 2020-04-03 | Disposition: A | Discharge status: Home / Self Care | Payer: Medicare HMO | Source: Ambulatory Visit | Attending: Orthopedic Surgery | Admitting: Orthopedic Surgery

## 2020-04-02 DIAGNOSIS — Z79890 Hormone replacement therapy (postmenopausal): Secondary | ICD-10-CM | POA: Insufficient documentation

## 2020-04-02 DIAGNOSIS — I10 Essential (primary) hypertension: Secondary | ICD-10-CM | POA: Insufficient documentation

## 2020-04-02 DIAGNOSIS — Z6826 Body mass index (BMI) 26.0-26.9, adult: Secondary | ICD-10-CM | POA: Diagnosis not present

## 2020-04-02 DIAGNOSIS — K219 Gastro-esophageal reflux disease without esophagitis: Secondary | ICD-10-CM | POA: Insufficient documentation

## 2020-04-02 DIAGNOSIS — M1711 Unilateral primary osteoarthritis, right knee: Principal | ICD-10-CM | POA: Insufficient documentation

## 2020-04-02 DIAGNOSIS — Z79899 Other long term (current) drug therapy: Secondary | ICD-10-CM | POA: Diagnosis not present

## 2020-04-02 DIAGNOSIS — Z96651 Presence of right artificial knee joint: Secondary | ICD-10-CM

## 2020-04-02 DIAGNOSIS — G8918 Other acute postprocedural pain: Secondary | ICD-10-CM | POA: Diagnosis not present

## 2020-04-02 DIAGNOSIS — E663 Overweight: Secondary | ICD-10-CM | POA: Diagnosis present

## 2020-04-02 DIAGNOSIS — E785 Hyperlipidemia, unspecified: Secondary | ICD-10-CM | POA: Insufficient documentation

## 2020-04-02 DIAGNOSIS — H409 Unspecified glaucoma: Secondary | ICD-10-CM | POA: Diagnosis not present

## 2020-04-02 DIAGNOSIS — D649 Anemia, unspecified: Secondary | ICD-10-CM | POA: Insufficient documentation

## 2020-04-02 HISTORY — PX: TOTAL KNEE ARTHROPLASTY: SHX125

## 2020-04-02 LAB — TYPE AND SCREEN
ABO/RH(D): A POS
Antibody Screen: NEGATIVE

## 2020-04-02 SURGERY — ARTHROPLASTY, KNEE, TOTAL
Anesthesia: Spinal | Site: Knee | Laterality: Right

## 2020-04-02 MED ORDER — ONDANSETRON HCL 4 MG/2ML IJ SOLN
INTRAMUSCULAR | Status: DC | PRN
Start: 2020-04-02 — End: 2020-04-02
  Administered 2020-04-02: 4 mg via INTRAVENOUS

## 2020-04-02 MED ORDER — HYDROMORPHONE HCL 1 MG/ML IJ SOLN
0.5000 mg | INTRAMUSCULAR | Status: DC | PRN
  Administered 2020-04-02: 1 mg via INTRAVENOUS
  Filled 2020-04-02: qty 1

## 2020-04-02 MED ORDER — HYDROMORPHONE HCL 2 MG PO TABS
2.0000 mg | ORAL_TABLET | ORAL | Status: DC | PRN
  Administered 2020-04-02: 2 mg via ORAL
  Filled 2020-04-02: qty 1

## 2020-04-02 MED ORDER — MAGNESIUM CITRATE PO SOLN: 1.0000 | Freq: Once | ORAL | Status: DC | PRN

## 2020-04-02 MED ORDER — PHENOL 1.4 % MT LIQD: 1.0000 | OROMUCOSAL | Status: DC | PRN

## 2020-04-02 MED ORDER — ACETAMINOPHEN 10 MG/ML IV SOLN
1000.0000 mg | Freq: Once | INTRAVENOUS | Status: DC | PRN
  Administered 2020-04-02: 1000 mg via INTRAVENOUS

## 2020-04-02 MED ORDER — CEFAZOLIN SODIUM-DEXTROSE 2-4 GM/100ML-% IV SOLN
2.0000 g | INTRAVENOUS | Status: AC
  Administered 2020-04-02: 2 g via INTRAVENOUS
  Filled 2020-04-02: qty 100

## 2020-04-02 MED ORDER — ONDANSETRON HCL 4 MG PO TABS: 4.0000 mg | ORAL_TABLET | Freq: Four times a day (QID) | ORAL | Status: DC | PRN

## 2020-04-02 MED ORDER — DEXAMETHASONE SODIUM PHOSPHATE 10 MG/ML IJ SOLN
10.0000 mg | Freq: Once | INTRAMUSCULAR | Status: AC
  Administered 2020-04-02: 8 mg via INTRAVENOUS

## 2020-04-02 MED ORDER — FENTANYL CITRATE (PF) 100 MCG/2ML IJ SOLN
INTRAMUSCULAR | Status: DC | PRN
  Administered 2020-04-02 (×2): 25 ug via INTRAVENOUS
  Administered 2020-04-02: 50 ug via INTRAVENOUS

## 2020-04-02 MED ORDER — PROPOFOL 10 MG/ML IV BOLUS
INTRAVENOUS | Status: AC
  Filled 2020-04-02: qty 20

## 2020-04-02 MED ORDER — DIPHENHYDRAMINE HCL 12.5 MG/5ML PO ELIX: 12.5000 mg | ORAL_SOLUTION | ORAL | Status: DC | PRN

## 2020-04-02 MED ORDER — ACETAMINOPHEN 10 MG/ML IV SOLN
INTRAVENOUS | Status: AC
  Filled 2020-04-02: qty 100

## 2020-04-02 MED ORDER — METHOCARBAMOL 500 MG PO TABS
500.0000 mg | ORAL_TABLET | Freq: Four times a day (QID) | ORAL | Status: DC | PRN
  Administered 2020-04-02 – 2020-04-03 (×3): 500 mg via ORAL
  Filled 2020-04-02 (×3): qty 1

## 2020-04-02 MED ORDER — LOSARTAN POTASSIUM 25 MG PO TABS
25.0000 mg | ORAL_TABLET | Freq: Every day | ORAL | Status: DC
  Administered 2020-04-02 – 2020-04-03 (×2): 25 mg via ORAL
  Filled 2020-04-02 (×2): qty 1

## 2020-04-02 MED ORDER — FENTANYL CITRATE (PF) 100 MCG/2ML IJ SOLN
INTRAMUSCULAR | Status: AC
  Filled 2020-04-02: qty 2

## 2020-04-02 MED ORDER — DEXAMETHASONE SODIUM PHOSPHATE 10 MG/ML IJ SOLN
10.0000 mg | Freq: Once | INTRAMUSCULAR | Status: AC
  Administered 2020-04-03: 10 mg via INTRAVENOUS
  Filled 2020-04-02: qty 1

## 2020-04-02 MED ORDER — 0.9 % SODIUM CHLORIDE (POUR BTL) OPTIME
TOPICAL | Status: DC | PRN
  Administered 2020-04-02: 1000 mL

## 2020-04-02 MED ORDER — HYDROMORPHONE HCL 2 MG PO TABS
4.0000 mg | ORAL_TABLET | ORAL | Status: DC | PRN
  Administered 2020-04-02 – 2020-04-03 (×4): 4 mg via ORAL
  Filled 2020-04-02 (×4): qty 2

## 2020-04-02 MED ORDER — FERROUS SULFATE 325 (65 FE) MG PO TABS
325.0000 mg | ORAL_TABLET | Freq: Two times a day (BID) | ORAL | Status: DC
  Administered 2020-04-03: 325 mg via ORAL
  Filled 2020-04-02: qty 1

## 2020-04-02 MED ORDER — TRANEXAMIC ACID-NACL 1000-0.7 MG/100ML-% IV SOLN
1000.0000 mg | Freq: Once | INTRAVENOUS | Status: AC
  Administered 2020-04-02: 1000 mg via INTRAVENOUS
  Filled 2020-04-02: qty 100

## 2020-04-02 MED ORDER — KETOROLAC TROMETHAMINE 30 MG/ML IJ SOLN
INTRAMUSCULAR | Status: AC
  Filled 2020-04-02: qty 1

## 2020-04-02 MED ORDER — METOCLOPRAMIDE HCL 5 MG/ML IJ SOLN: 5.0000 mg | Freq: Three times a day (TID) | INTRAMUSCULAR | Status: DC | PRN

## 2020-04-02 MED ORDER — MIDAZOLAM HCL 2 MG/2ML IJ SOLN
INTRAMUSCULAR | Status: AC
  Filled 2020-04-02: qty 2

## 2020-04-02 MED ORDER — METHOCARBAMOL 500 MG IVPB - SIMPLE MED
INTRAVENOUS | Status: AC
  Filled 2020-04-02: qty 50

## 2020-04-02 MED ORDER — BISACODYL 10 MG RE SUPP: 10.0000 mg | Freq: Every day | RECTAL | Status: DC | PRN

## 2020-04-02 MED ORDER — CHLORHEXIDINE GLUCONATE 0.12 % MT SOLN
15.0000 mL | Freq: Once | OROMUCOSAL | Status: AC
  Administered 2020-04-02: 15 mL via OROMUCOSAL
  Filled 2020-04-02: qty 15

## 2020-04-02 MED ORDER — PROPOFOL 500 MG/50ML IV EMUL
INTRAVENOUS | Status: DC | PRN
  Administered 2020-04-02: 100 ug/kg/min via INTRAVENOUS

## 2020-04-02 MED ORDER — DEXAMETHASONE SODIUM PHOSPHATE 10 MG/ML IJ SOLN
INTRAMUSCULAR | Status: AC
  Filled 2020-04-02: qty 1

## 2020-04-02 MED ORDER — PHENYLEPHRINE HCL-NACL 10-0.9 MG/250ML-% IV SOLN
INTRAVENOUS | Status: DC | PRN
Start: 2020-04-02 — End: 2020-04-02
  Administered 2020-04-02: 35 ug/min via INTRAVENOUS

## 2020-04-02 MED ORDER — PROPOFOL 10 MG/ML IV BOLUS
INTRAVENOUS | Status: DC | PRN
  Administered 2020-04-02: 30 mg via INTRAVENOUS

## 2020-04-02 MED ORDER — LACTATED RINGERS IV SOLN: INTRAVENOUS | Status: DC

## 2020-04-02 MED ORDER — MECLIZINE HCL 25 MG PO TABS: 12.5000 mg | ORAL_TABLET | Freq: Three times a day (TID) | ORAL | Status: DC | PRN

## 2020-04-02 MED ORDER — METOCLOPRAMIDE HCL 5 MG PO TABS: 5.0000 mg | ORAL_TABLET | Freq: Three times a day (TID) | ORAL | Status: DC | PRN

## 2020-04-02 MED ORDER — SODIUM CHLORIDE (PF) 0.9 % IJ SOLN
INTRAMUSCULAR | Status: AC
  Filled 2020-04-02: qty 50

## 2020-04-02 MED ORDER — ONDANSETRON HCL 4 MG/2ML IJ SOLN
INTRAMUSCULAR | Status: AC
  Filled 2020-04-02: qty 2

## 2020-04-02 MED ORDER — CEFAZOLIN SODIUM-DEXTROSE 2-4 GM/100ML-% IV SOLN
2.0000 g | Freq: Four times a day (QID) | INTRAVENOUS | Status: AC
  Administered 2020-04-02 (×2): 2 g via INTRAVENOUS
  Filled 2020-04-02 (×2): qty 100

## 2020-04-02 MED ORDER — BUPIVACAINE HCL (PF) 0.25 % IJ SOLN
INTRAMUSCULAR | Status: AC
  Filled 2020-04-02: qty 30

## 2020-04-02 MED ORDER — POLYETHYLENE GLYCOL 3350 17 G PO PACK
17.0000 g | PACK | Freq: Two times a day (BID) | ORAL | Status: DC
  Administered 2020-04-02: 17 g via ORAL
  Filled 2020-04-02 (×2): qty 1

## 2020-04-02 MED ORDER — PRAVASTATIN SODIUM 20 MG PO TABS
20.0000 mg | ORAL_TABLET | Freq: Every day | ORAL | Status: DC
  Administered 2020-04-02 – 2020-04-03 (×2): 20 mg via ORAL
  Filled 2020-04-02 (×2): qty 1

## 2020-04-02 MED ORDER — BRIMONIDINE TARTRATE 0.15 % OP SOLN
1.0000 [drp] | Freq: Three times a day (TID) | OPHTHALMIC | Status: DC
  Administered 2020-04-02 – 2020-04-03 (×3): 1 [drp] via OPHTHALMIC
  Filled 2020-04-02 (×2): qty 5

## 2020-04-02 MED ORDER — ASPIRIN 81 MG PO CHEW
81.0000 mg | CHEWABLE_TABLET | Freq: Two times a day (BID) | ORAL | Status: DC
  Administered 2020-04-02 – 2020-04-03 (×2): 81 mg via ORAL
  Filled 2020-04-02 (×2): qty 1

## 2020-04-02 MED ORDER — KETOROLAC TROMETHAMINE 30 MG/ML IJ SOLN
INTRAMUSCULAR | Status: DC | PRN
  Administered 2020-04-02: 30 mg

## 2020-04-02 MED ORDER — HYDROMORPHONE HCL 1 MG/ML IJ SOLN: 0.2500 mg | INTRAMUSCULAR | Status: DC | PRN

## 2020-04-02 MED ORDER — LATANOPROST 0.005 % OP SOLN
1.0000 [drp] | Freq: Every day | OPHTHALMIC | Status: DC
  Administered 2020-04-02: 1 [drp] via OPHTHALMIC
  Filled 2020-04-02: qty 2.5

## 2020-04-02 MED ORDER — FAMOTIDINE 20 MG PO TABS
40.0000 mg | ORAL_TABLET | Freq: Every day | ORAL | Status: DC | PRN
  Administered 2020-04-03: 40 mg via ORAL
  Filled 2020-04-02: qty 2

## 2020-04-02 MED ORDER — MENTHOL 3 MG MT LOZG: 1.0000 | LOZENGE | OROMUCOSAL | Status: DC | PRN

## 2020-04-02 MED ORDER — SODIUM CHLORIDE 0.9 % IV SOLN: INTRAVENOUS | Status: DC

## 2020-04-02 MED ORDER — POVIDONE-IODINE 10 % EX SWAB
2.0000 "application " | Freq: Once | CUTANEOUS | Status: AC
  Administered 2020-04-02: 2 via TOPICAL

## 2020-04-02 MED ORDER — BUPIVACAINE HCL (PF) 0.5 % IJ SOLN
INTRAMUSCULAR | Status: DC | PRN
  Administered 2020-04-02: 20 mL via PERINEURAL

## 2020-04-02 MED ORDER — SALINE SPRAY 0.65 % NA SOLN: 1.0000 | NASAL | Status: DC | PRN

## 2020-04-02 MED ORDER — TRANEXAMIC ACID-NACL 1000-0.7 MG/100ML-% IV SOLN
1000.0000 mg | INTRAVENOUS | Status: AC
  Administered 2020-04-02: 1000 mg via INTRAVENOUS
  Filled 2020-04-02: qty 100

## 2020-04-02 MED ORDER — BUPIVACAINE HCL (PF) 0.25 % IJ SOLN
INTRAMUSCULAR | Status: DC | PRN
  Administered 2020-04-02: 30 mL

## 2020-04-02 MED ORDER — SYSTANE NIGHTTIME OP OINT: 1.0000 "application " | TOPICAL_OINTMENT | Freq: Every evening | OPHTHALMIC | Status: DC | PRN

## 2020-04-02 MED ORDER — SODIUM CHLORIDE (PF) 0.9 % IJ SOLN
INTRAMUSCULAR | Status: DC | PRN
  Administered 2020-04-02: 50 mL

## 2020-04-02 MED ORDER — STERILE WATER FOR IRRIGATION IR SOLN
Status: DC | PRN
  Administered 2020-04-02: 2000 mL

## 2020-04-02 MED ORDER — ONDANSETRON HCL 4 MG/2ML IJ SOLN
4.0000 mg | Freq: Four times a day (QID) | INTRAMUSCULAR | Status: DC | PRN
  Administered 2020-04-02: 4 mg via INTRAVENOUS
  Filled 2020-04-02: qty 2

## 2020-04-02 MED ORDER — SODIUM CHLORIDE 0.9 % IR SOLN
Status: DC | PRN
  Administered 2020-04-02: 1000 mL

## 2020-04-02 MED ORDER — METHOCARBAMOL 500 MG IVPB - SIMPLE MED
500.0000 mg | Freq: Four times a day (QID) | INTRAVENOUS | Status: DC | PRN
  Administered 2020-04-02: 500 mg via INTRAVENOUS
  Filled 2020-04-02: qty 50

## 2020-04-02 MED ORDER — ACETAMINOPHEN 325 MG PO TABS: 325.0000 mg | ORAL_TABLET | Freq: Four times a day (QID) | ORAL | Status: DC | PRN

## 2020-04-02 MED ORDER — ALUM & MAG HYDROXIDE-SIMETH 200-200-20 MG/5ML PO SUSP
15.0000 mL | ORAL | Status: DC | PRN
  Administered 2020-04-03: 15 mL via ORAL
  Filled 2020-04-02: qty 30

## 2020-04-02 MED ORDER — DOCUSATE SODIUM 100 MG PO CAPS
100.0000 mg | ORAL_CAPSULE | Freq: Two times a day (BID) | ORAL | Status: DC
  Administered 2020-04-02 – 2020-04-03 (×2): 100 mg via ORAL
  Filled 2020-04-02 (×2): qty 1

## 2020-04-02 MED ORDER — BUPIVACAINE IN DEXTROSE 0.75-8.25 % IT SOLN
INTRATHECAL | Status: DC | PRN
  Administered 2020-04-02: 1.4 mL via INTRATHECAL

## 2020-04-02 MED ORDER — POLYETHYL GLYCOL-PROPYL GLYCOL 0.4-0.3 % OP GEL: OPHTHALMIC | Status: DC | PRN

## 2020-04-02 MED ORDER — ACETAMINOPHEN 500 MG PO TABS
1000.0000 mg | ORAL_TABLET | Freq: Four times a day (QID) | ORAL | Status: AC
  Administered 2020-04-02 – 2020-04-03 (×4): 1000 mg via ORAL
  Filled 2020-04-02 (×4): qty 2

## 2020-04-02 SURGICAL SUPPLY — 60 items
ATTUNE MED ANAT PAT 35 KNEE (Knees) ×2 IMPLANT
ATTUNE PSFEM RTSZ4 NARCEM KNEE (Femur) ×2 IMPLANT
ATTUNE PSRP INSR SZ4 6 KNEE (Insert) ×2 IMPLANT
BAG ZIPLOCK 12X15 (MISCELLANEOUS) IMPLANT
BASE TIBIAL ROT PLAT SZ 5 KNEE (Knees) ×1 IMPLANT
BLADE SAW SGTL 11.0X1.19X90.0M (BLADE) ×2 IMPLANT
BLADE SAW SGTL 13.0X1.19X90.0M (BLADE) ×2 IMPLANT
BLADE SURG SZ10 CARB STEEL (BLADE) ×4 IMPLANT
BNDG ELASTIC 6X5.8 VLCR STR LF (GAUZE/BANDAGES/DRESSINGS) ×2 IMPLANT
BOWL SMART MIX CTS (DISPOSABLE) ×2 IMPLANT
CEMENT HV SMART SET (Cement) ×4 IMPLANT
COVER SURGICAL LIGHT HANDLE (MISCELLANEOUS) ×2 IMPLANT
COVER WAND RF STERILE (DRAPES) ×2 IMPLANT
CUFF TOURN SGL QUICK 34 (TOURNIQUET CUFF) ×1
CUFF TRNQT CYL 34X4.125X (TOURNIQUET CUFF) ×1 IMPLANT
DECANTER SPIKE VIAL GLASS SM (MISCELLANEOUS) ×4 IMPLANT
DERMABOND ADVANCED (GAUZE/BANDAGES/DRESSINGS) ×1
DERMABOND ADVANCED .7 DNX12 (GAUZE/BANDAGES/DRESSINGS) ×1 IMPLANT
DRAPE U-SHAPE 47X51 STRL (DRAPES) ×2 IMPLANT
DRESSING AQUACEL AG SP 3.5X10 (GAUZE/BANDAGES/DRESSINGS) ×1 IMPLANT
DRSG AQUACEL AG SP 3.5X10 (GAUZE/BANDAGES/DRESSINGS) ×2
DURAPREP 26ML APPLICATOR (WOUND CARE) ×4 IMPLANT
ELECT REM PT RETURN 15FT ADLT (MISCELLANEOUS) ×2 IMPLANT
GLOVE BIO SURGEON STRL SZ 6 (GLOVE) ×2 IMPLANT
GLOVE BIOGEL PI IND STRL 6.5 (GLOVE) ×1 IMPLANT
GLOVE BIOGEL PI IND STRL 7.5 (GLOVE) ×1 IMPLANT
GLOVE BIOGEL PI IND STRL 8.5 (GLOVE) ×1 IMPLANT
GLOVE BIOGEL PI INDICATOR 6.5 (GLOVE) ×1
GLOVE BIOGEL PI INDICATOR 7.5 (GLOVE) ×1
GLOVE BIOGEL PI INDICATOR 8.5 (GLOVE) ×1
GLOVE ECLIPSE 8.0 STRL XLNG CF (GLOVE) ×2 IMPLANT
GLOVE ORTHO TXT STRL SZ7.5 (GLOVE) ×2 IMPLANT
GOWN STRL REUS W/ TWL LRG LVL3 (GOWN DISPOSABLE) ×1 IMPLANT
GOWN STRL REUS W/TWL 2XL LVL3 (GOWN DISPOSABLE) ×2 IMPLANT
GOWN STRL REUS W/TWL LRG LVL3 (GOWN DISPOSABLE) ×3 IMPLANT
HANDPIECE INTERPULSE COAX TIP (DISPOSABLE) ×1
HOLDER FOLEY CATH W/STRAP (MISCELLANEOUS) ×2 IMPLANT
KIT TURNOVER KIT A (KITS) IMPLANT
MANIFOLD NEPTUNE II (INSTRUMENTS) ×2 IMPLANT
NDL SAFETY ECLIPSE 18X1.5 (NEEDLE) ×1 IMPLANT
NEEDLE HYPO 18GX1.5 SHARP (NEEDLE) ×1
NS IRRIG 1000ML POUR BTL (IV SOLUTION) ×2 IMPLANT
PACK TOTAL KNEE CUSTOM (KITS) ×2 IMPLANT
PENCIL SMOKE EVACUATOR (MISCELLANEOUS) ×2 IMPLANT
PIN DRILL FIX HALF THREAD (BIT) ×2 IMPLANT
PIN FIX SIGMA LCS THRD HI (PIN) ×2 IMPLANT
PROTECTOR NERVE ULNAR (MISCELLANEOUS) ×2 IMPLANT
SET HNDPC FAN SPRY TIP SCT (DISPOSABLE) ×1 IMPLANT
SET PAD KNEE POSITIONER (MISCELLANEOUS) ×2 IMPLANT
SUT MNCRL AB 4-0 PS2 18 (SUTURE) ×2 IMPLANT
SUT STRATAFIX PDS+ 0 24IN (SUTURE) ×2 IMPLANT
SUT VIC AB 1 CT1 36 (SUTURE) ×2 IMPLANT
SUT VIC AB 2-0 CT1 27 (SUTURE) ×3
SUT VIC AB 2-0 CT1 TAPERPNT 27 (SUTURE) ×3 IMPLANT
SYR 3ML LL SCALE MARK (SYRINGE) ×2 IMPLANT
TIBIAL BASE ROT PLAT SZ 5 KNEE (Knees) ×2 IMPLANT
TRAY FOLEY MTR SLVR 16FR STAT (SET/KITS/TRAYS/PACK) ×2 IMPLANT
WATER STERILE IRR 1000ML POUR (IV SOLUTION) ×4 IMPLANT
WRAP KNEE MAXI GEL POST OP (GAUZE/BANDAGES/DRESSINGS) ×2 IMPLANT
YANKAUER SUCT BULB TIP 10FT TU (MISCELLANEOUS) ×2 IMPLANT

## 2020-04-02 NOTE — Anesthesia Procedure Notes (Signed)
Procedure Name: MAC Date/Time: 04/02/2020 7:20 AM Performed by: Lissa Morales, CRNA Pre-anesthesia Checklist: Patient identified, Emergency Drugs available, Suction available, Patient being monitored and Timeout performed Patient Re-evaluated:Patient Re-evaluated prior to induction Oxygen Delivery Method: Simple face mask Placement Confirmation: positive ETCO2

## 2020-04-02 NOTE — Transfer of Care (Signed)
Immediate Anesthesia Transfer of Care Note  Patient: [name]  Procedure(s) Performed: TOTAL KNEE ARTHROPLASTY (Right Knee)  Patient Location: PACU  Anesthesia Type:Spinal  Level of Consciousness: awake, alert , oriented and patient cooperative  Airway & Oxygen Therapy: Patient Spontanous Breathing and Patient connected to face mask oxygen  Post-op Assessment: Report given to RN and Post -op Vital signs reviewed and stable  Post vital signs: stable  Last Vitals:  Vitals Value Taken Time  BP 122/65 04/02/20 0910  Temp    Pulse 82 04/02/20 0913  Resp 17 04/02/20 0913  SpO2 100 % 04/02/20 0913  Vitals shown include unvalidated device data.  Last Pain:  Vitals:   04/02/20 0910  TempSrc:   PainSc: (P) 0-No pain      Patients Stated Pain Goal: 4 ([id number])  Complications: No apparent anesthesia complications

## 2020-04-02 NOTE — Anesthesia Postprocedure Evaluation (Signed)
Anesthesia Post Note  Patient: Soraiya Schoenbachler Penza  Procedure(s) Performed: TOTAL KNEE ARTHROPLASTY (Right Knee)     Patient location during evaluation: PACU Anesthesia Type: Spinal Level of consciousness: oriented and awake and alert Pain management: pain level controlled Vital Signs Assessment: post-procedure vital signs reviewed and stable Respiratory status: spontaneous breathing, respiratory function stable and patient connected to nasal cannula oxygen Cardiovascular status: blood pressure returned to baseline and stable Postop Assessment: no headache, no backache and no apparent nausea or vomiting Anesthetic complications: no    Last Vitals:  Vitals:   04/02/20 0945 04/02/20 1000  BP: 135/64 (!) 126/58  Pulse: (!) 49 (!) 56  Resp: 14 12  Temp:    SpO2: 100% 100%    Last Pain:  Vitals:   04/02/20 1000  TempSrc:   PainSc: Asleep            L Sensory Level: L2-Upper inner thigh, upper buttock (04/02/20 1000) R Sensory Level: L2-Upper inner thigh, upper buttock (04/02/20 1000)  Averey Koning S

## 2020-04-02 NOTE — Discharge Instructions (Signed)

## 2020-04-02 NOTE — Interval H&P Note (Signed)
History and Physical Interval Note:  04/02/2020 7:12 AM  [name]  has presented today for surgery, with the diagnosis of Right knee osteoarthritis.  The various methods of treatment have been discussed with the patient and family. After consideration of risks, benefits and other options for treatment, the patient has consented to  Procedure(s) with comments: TOTAL KNEE ARTHROPLASTY (Right) - 70 mins as a surgical intervention.  The patient's history has been reviewed, patient examined, no change in status, stable for surgery.  I have reviewed the patient's chart and labs.  Questions were answered to the patient's satisfaction.     Mauri Pole

## 2020-04-02 NOTE — Anesthesia Procedure Notes (Signed)
Anesthesia Procedure Image    

## 2020-04-02 NOTE — Anesthesia Procedure Notes (Signed)
Spinal  Patient location during procedure: OR End time: 04/02/2020 7:29 AM Staffing Performed: resident/CRNA  Resident/CRNA: Lissa Morales, CRNA Preanesthetic Checklist Completed: patient identified, IV checked, site marked, risks and benefits discussed, surgical consent, monitors and equipment checked, pre-op evaluation and timeout performed Spinal Block Patient position: sitting Prep: DuraPrep Patient monitoring: heart rate, continuous pulse ox, blood pressure and cardiac monitor Approach: midline Location: L3-4 Injection technique: single-shot Needle Needle type: Pencan  Needle gauge: 24 G Needle length: 9 cm Additional Notes Expiration date of kit checked and confirmed. Patient tolerated procedure well, without complications.

## 2020-04-02 NOTE — Op Note (Signed)
NAME:  [name]                      MEDICAL RECORD NO.:  [id number]                             FACILITY:  [facility]      PHYSICIAN:  Pietro Cassis. Alvan Dame, M.D.  DATE OF BIRTH:  01-21-1941      DATE OF PROCEDURE:  04/02/2020                                     OPERATIVE REPORT         PREOPERATIVE DIAGNOSIS:  Right knee osteoarthritis.      POSTOPERATIVE DIAGNOSIS:  Right knee osteoarthritis.      FINDINGS:  The patient was noted to have complete loss of cartilage and   bone-on-bone arthritis with associated osteophytes in the lateral and patellofemoral compartments of   the knee with a significant synovitis and associated effusion.  The patient had failed months of conservative treatment including medications, injection therapy, activity modification.     PROCEDURE:  Right total knee replacement.      COMPONENTS USED:  DePuy Attune rotating platform posterior stabilized knee   system, a size 4N femur, 5 tibia, size 6 mm PS AOX insert, and 35 anatomic patellar   button.      SURGEON:  Pietro Cassis. Alvan Dame, M.D.      ASSISTANT:  Griffith Citron, PA-C.      ANESTHESIA:  Regional and Spinal.      SPECIMENS:  None.      COMPLICATION:  None.      DRAINS:  None.  EBL: <150cc      TOURNIQUET TIME:   Total Tourniquet Time Documented: Thigh (Right) - 27 minutes Total: Thigh (Right) - 27 minutes      The patient was stable to the recovery room.      INDICATION FOR PROCEDURE:  [name] is a 80 y.o. female patient of   mine.  The patient had been seen, evaluated, and treated for months conservatively in the   office with medication, activity modification, and injections.  The patient had   radiographic changes of bone-on-bone arthritis with endplate sclerosis and osteophytes noted.  Based on the radiographic changes and failed conservative measures, the patient   decided to proceed with definitive treatment, total knee replacement.  Risks of infection, DVT, component  failure, need for revision surgery, neurovascular injury were reviewed in the office setting.  The postop course was reviewed stressing the efforts to maximize post-operative satisfaction and function.  Consent was obtained for benefit of pain   relief.      PROCEDURE IN DETAIL:  The patient was brought to the operative theater.   Once adequate anesthesia, preoperative antibiotics, 2 gm of Ancef,1 gm of Tranexamic Acid, and 10 mg of Decadron administered, the patient was positioned supine with a right thigh tourniquet placed.  The  right lower extremity was prepped and draped in sterile fashion.  A time-   out was performed identifying the patient, planned procedure, and the appropriate extremity.      The right lower extremity was placed in the [facility] leg holder.  The leg was   exsanguinated, tourniquet elevated to 250 mmHg.  A midline incision was   made followed by  median parapatellar arthrotomy.  Following initial   exposure, attention was first directed to the patella.  Precut   measurement was noted to be 22 mm.  I resected down to 13 mm and used a   35 anatomic patellar button to restore patellar height as well as cover the cut surface.      The lug holes were drilled and a metal shim was placed to protect the   patella from retractors and saw blade during the procedure.      At this point, attention was now directed to the femur.  The femoral   canal was opened with a drill, irrigated to try to prevent fat emboli.  An   intramedullary rod was passed at 3 degrees valgus, 9 mm of bone was   resected off the distal femur.  Following this resection, the tibia was   subluxated anteriorly.  Using the extramedullary guide, 2 mm of bone was resected off   the proximal lateral tibia.  We confirmed the gap would be   stable medially and laterally with a size 5 spacer block as well as confirmed that the tibial cut was perpendicular in the coronal plane, checking with an alignment rod.       Once this was done, I sized the femur to be a size 4 in the anterior-   posterior dimension, chose a narrow component based on medial and   lateral dimension.  The size 4 rotation block was then pinned in   position anterior referenced using the C-clamp to set rotation.  The   anterior, posterior, and  chamfer cuts were made without difficulty nor   notching making certain that I was along the anterior cortex to help   with flexion gap stability.      The final box cut was made off the lateral aspect of distal femur.      At this point, the tibia was sized to be a size 5.  The size 5 tray was   then pinned in position through the medial third of the tubercle,   drilled, and keel punched.  Trial reduction was now carried with a 4 femur,  5 tibia, a size 6 mm PS insert, and the 35 anatomic patella botton.  The knee was brought to full extension with good flexion stability with the patella   tracking through the trochlea without application of pressure.  Given   all these findings the trial components removed.  Final components were   opened and cement was mixed.  The knee was irrigated with normal saline solution and pulse lavage.  The synovial lining was   then injected with 30 cc of 0.25% Marcaine with epinephrine, 1 cc of Toradol and 30 cc of NS for a total of 61 cc.     Final implants were then cemented onto cleaned and dried cut surfaces of bone with the knee brought to extension with a size 6 mm PS trial insert.      Once the cement had fully cured, excess cement was removed   throughout the knee.  I confirmed that I was satisfied with the range of   motion and stability, and the final size 6 mm PS AOX insert was chosen.  It was   placed into the knee.      The tourniquet had been let down at 27 minutes.  No significant   hemostasis was required.  The extensor mechanism was then reapproximated using #1 Vicryl and #1 Stratafix  sutures with the knee   in flexion.  The   remaining wound  was closed with 2-0 Vicryl and running 4-0 Monocryl.   The knee was cleaned, dried, dressed sterilely using Dermabond and   Aquacel dressing.  The patient was then   brought to recovery room in stable condition, tolerating the procedure   well.   Please note that Physician Assistant, Griffith Citron, PA-C was present for the entirety of the case, and was utilized for pre-operative positioning, peri-operative retractor management, general facilitation of the procedure and for primary wound closure at the end of the case.              Pietro Cassis Alvan Dame, M.D.    04/02/2020 8:45 AM

## 2020-04-02 NOTE — Care Plan (Signed)
Ortho Bundle Case Management Note  Patient Details  Name: [name] MRN: [id number] Date of Birth: 09-May-1941  R TKA on 04-02-20 DCP:  Home with sister and friend.  1 story home with 3-4 ste. DME:  RW and 3-in-1 ordered through [place] PT:  Nicole Kindred PT.  PT eval scheduled on 04-05-20.             4      DME Arranged:  Walker rolling, 3-N-1 DME Agency:  [employer]  HH Arranged:  NA HH Agency:  NA  Additional Comments: Please contact me with any questions of if this plan should need to change.  Marianne Sofia, RN,CCM [facility]  [phone] 04/02/2020, 4:38 PM

## 2020-04-02 NOTE — Evaluation (Signed)
Physical Therapy Evaluation Patient Details Name: [name] MRN: [id number] DOB: [birth year] Today's Date: 04/02/2020   History of Present Illness  s/p R TKA  Clinical Impression  Pt is s/p TKA resulting in the deficits listed below (see PT Problem List).  Pt amb 60'. Anticipate she will progress well in acute setting.   Pt will benefit from skilled PT to increase their independence and safety with mobility to allow discharge to the venue listed below.      Follow Up Recommendations Follow surgeon's recommendation for DC plan and follow-up therapies    Equipment Recommendations  3in1 (PT);Rolling walker with 5" wheels    Recommendations for Other Services       Precautions / Restrictions Precautions Precautions: Fall;Knee Restrictions Weight Bearing Restrictions: No Other Position/Activity Restrictions: WBAT      Mobility  Bed Mobility Overal bed mobility: Needs Assistance Bed Mobility: Supine to Sit     Supine to sit: Min assist;Min guard     General bed mobility comments: cues to self assist, light assist with RLE  Transfers Overall transfer level: Needs assistance Equipment used: Rolling walker (2 wheeled) Transfers: Sit to/from Stand Sit to Stand: Min assist         General transfer comment: cues for  hand placement  Ambulation/Gait Ambulation/Gait assistance: Min assist Gait Distance (Feet): 60 Feet Assistive device: Rolling walker (2 wheeled) Gait Pattern/deviations: Step-to pattern     General Gait Details: cues for sequence and RW position  Stairs            Wheelchair Mobility    Modified Rankin (Stroke Patients Only)       Balance                                             Pertinent Vitals/Pain Pain Assessment: 0-10 Pain Location: right knee Pain Descriptors / Indicators: Aching;Discomfort;Grimacing Pain Intervention(s): Limited activity within patient's tolerance;Monitored during  session;Premedicated before session;Repositioned    Home Living Family/patient expects to be discharged to:: Private residence Living Arrangements: Alone;Non-relatives/Friends;Other relatives(sister and friend) Available Help at Discharge: Available 24 hours/day Type of Home: House Home Access: Stairs to enter Entrance Stairs-Rails: Left;Right;Can reach both Entrance Stairs-Number of Steps: 3 Home Layout: One level Home Equipment: None      Prior Function Level of Independence: Independent               Hand Dominance        Extremity/Trunk Assessment   Upper Extremity Assessment Upper Extremity Assessment: Overall WFL for tasks assessed    Lower Extremity Assessment Lower Extremity Assessment: RLE deficits/detail RLE Deficits / Details: ankle WFL, knee and hip 2+/5       Communication   Communication: No difficulties  Cognition Arousal/Alertness: Awake/alert Behavior During Therapy: WFL for tasks assessed/performed Overall Cognitive Status: Within Functional Limits for tasks assessed                                        General Comments      Exercises Total Joint Exercises Ankle Circles/Pumps: AROM;10 reps;Both Quad Sets: 5 reps;Right;AROM   Assessment/Plan    PT Assessment Patient needs continued PT services  PT Problem List Decreased strength;Decreased range of motion;Decreased activity tolerance;Decreased mobility;Pain;Decreased knowledge of use of DME  PT Treatment Interventions DME instruction;Therapeutic exercise;Gait training;Functional mobility training;Therapeutic activities;Patient/family education;Stair training    PT Goals (Current goals can be found in the Care Plan section)  Acute Rehab PT Goals Patient Stated Goal: home tomorrow PT Goal Formulation: With patient Time For Goal Achievement: 04/09/20 Potential to Achieve Goals: Good    Frequency 7X/week   Barriers to discharge        Co-evaluation                AM-PAC PT "6 Clicks" Mobility  Outcome Measure Help needed turning from your back to your side while in a flat bed without using bedrails?: A Little Help needed moving from lying on your back to sitting on the side of a flat bed without using bedrails?: A Little Help needed moving to and from a bed to a chair (including a wheelchair)?: A Little Help needed standing up from a chair using your arms (e.g., wheelchair or bedside chair)?: A Little Help needed to walk in hospital room?: A Little Help needed climbing 3-5 steps with a railing? : A Little 6 Click Score: 18    End of Session Equipment Utilized During Treatment: Gait belt Activity Tolerance: Patient tolerated treatment well Patient left: in chair;with call bell/phone within reach;with family/visitor present;with chair alarm set   PT Visit Diagnosis: Difficulty in walking, not elsewhere classified (R26.2)    Time: [id number] PT Time Calculation (min) (ACUTE ONLY): 21 min   Charges:   PT Evaluation $PT Eval Low Complexity: 1 Low          Ardell Makarewicz, PT   Acute Rehab Dept [facility]): [id number]   04/02/2020   [facility] 04/02/2020, 1:50 PM

## 2020-04-02 NOTE — Anesthesia Procedure Notes (Signed)
Anesthesia Regional Block: Adductor canal block   Pre-Anesthetic Checklist: ,, timeout performed, Correct Patient, Correct Site, Correct Laterality, Correct Procedure, Correct Position, site marked, Risks and benefits discussed,  Surgical consent,  Pre-op evaluation,  At surgeon's request and post-op pain management  Laterality: Right  Prep: chloraprep       Needles:  Injection technique: Single-shot  Needle Type: Echogenic Needle     Needle Length: 10cm      Additional Needles:   Procedures:,,,, ultrasound used (permanent image in chart),,,,  Narrative:  Start time: 04/02/2020 6:53 AM End time: 04/02/2020 6:59 AM Injection made incrementally with aspirations every 5 mL.  Performed by: Personally  Anesthesiologist: Myrtie Soman, MD  Additional Notes: Patient tolerated the procedure well without complications

## 2020-04-03 ENCOUNTER — Encounter: Payer: Self-pay | Admitting: *Deleted

## 2020-04-03 DIAGNOSIS — Z79899 Other long term (current) drug therapy: Secondary | ICD-10-CM | POA: Diagnosis not present

## 2020-04-03 DIAGNOSIS — Z96651 Presence of right artificial knee joint: Secondary | ICD-10-CM | POA: Diagnosis not present

## 2020-04-03 DIAGNOSIS — K219 Gastro-esophageal reflux disease without esophagitis: Secondary | ICD-10-CM | POA: Diagnosis not present

## 2020-04-03 DIAGNOSIS — I10 Essential (primary) hypertension: Secondary | ICD-10-CM | POA: Diagnosis not present

## 2020-04-03 DIAGNOSIS — E663 Overweight: Secondary | ICD-10-CM | POA: Diagnosis present

## 2020-04-03 DIAGNOSIS — H409 Unspecified glaucoma: Secondary | ICD-10-CM | POA: Diagnosis not present

## 2020-04-03 DIAGNOSIS — E785 Hyperlipidemia, unspecified: Secondary | ICD-10-CM | POA: Diagnosis not present

## 2020-04-03 DIAGNOSIS — M1711 Unilateral primary osteoarthritis, right knee: Secondary | ICD-10-CM | POA: Diagnosis not present

## 2020-04-03 DIAGNOSIS — M25561 Pain in right knee: Secondary | ICD-10-CM | POA: Diagnosis not present

## 2020-04-03 DIAGNOSIS — D649 Anemia, unspecified: Secondary | ICD-10-CM | POA: Diagnosis not present

## 2020-04-03 DIAGNOSIS — Z79890 Hormone replacement therapy (postmenopausal): Secondary | ICD-10-CM | POA: Diagnosis not present

## 2020-04-03 LAB — BASIC METABOLIC PANEL
Anion gap: 7 (ref 5–15)
BUN: 13 mg/dL (ref 8–23)
CO2: 26 mmol/L (ref 22–32)
Calcium: 8.6 mg/dL — ABNORMAL LOW (ref 8.9–10.3)
Chloride: 107 mmol/L (ref 98–111)
Creatinine, Ser: 0.67 mg/dL (ref 0.44–1.00)
GFR calc Af Amer: >60 mL/min (ref >60)
GFR calc non Af Amer: >60 mL/min (ref >60)
Glucose, Bld: 132 mg/dL — ABNORMAL HIGH (ref 70–99)
Potassium: 4.2 mmol/L (ref 3.5–5.1)
Sodium: 140 mmol/L (ref 135–145)

## 2020-04-03 LAB — CBC
HCT: 30.5 % — ABNORMAL LOW (ref 36.0–46.0)
Hemoglobin: 9.6 g/dL — ABNORMAL LOW (ref 12.0–15.0)
MCH: 29.2 pg (ref 26.0–34.0)
MCHC: 31.5 g/dL (ref 30.0–36.0)
MCV: 92.7 fL (ref 80.0–100.0)
Platelets: 166 10*3/uL (ref 150–400)
RBC: 3.29 MIL/uL — ABNORMAL LOW (ref 3.87–5.11)
RDW: 13.2 % (ref 11.5–15.5)
WBC: 9.4 10*3/uL (ref 4.0–10.5)
nRBC: 0 % (ref 0.0–0.2)

## 2020-04-03 MED ORDER — ACETAMINOPHEN 500 MG PO TABS
1000.0000 mg | ORAL_TABLET | Freq: Three times a day (TID) | ORAL | 0 refills | Status: DC
Start: 2020-04-03 — End: 2020-08-26

## 2020-04-03 MED ORDER — FERROUS SULFATE 325 (65 FE) MG PO TABS: 325.0000 mg | ORAL_TABLET | Freq: Three times a day (TID) | ORAL | 0 refills | Status: DC

## 2020-04-03 MED ORDER — POLYETHYLENE GLYCOL 3350 17 G PO PACK
17.0000 g | PACK | Freq: Two times a day (BID) | ORAL | 0 refills | Status: DC
Start: 2020-04-03 — End: 2020-08-26

## 2020-04-03 MED ORDER — ASPIRIN 81 MG PO CHEW: 81.0000 mg | CHEWABLE_TABLET | Freq: Two times a day (BID) | ORAL | 0 refills | Status: AC

## 2020-04-03 MED ORDER — HYDROMORPHONE HCL 2 MG PO TABS: 2.0000 mg | ORAL_TABLET | ORAL | 0 refills | Status: DC | PRN

## 2020-04-03 MED ORDER — DOCUSATE SODIUM 100 MG PO CAPS
100.0000 mg | ORAL_CAPSULE | Freq: Two times a day (BID) | ORAL | 0 refills | Status: DC
Start: 2020-04-03 — End: 2020-08-26

## 2020-04-03 NOTE — Progress Notes (Signed)
     Subjective: 1 Day Post-Op Procedure(s) (LRB): TOTAL KNEE ARTHROPLASTY (Right)   Patient reports pain as mild, pain controlled with medication.  No reported events overnight.  Impressed herself with how well she felt she did with therapy yesterday.  Ready be discharged home, she does well therapy today.  Follow-up in the clinic in 2 weeks.  Patient is to call with any questions or concerns.   Patient's anticipated LOS is less than 2 midnights, meeting these requirements: - Lives within 1 hour of care - Has a competent adult at home to recover with post-op recover - NO history of  - Chronic pain requiring opiods  - Diabetes  - Coronary Artery Disease  - Heart failure  - Heart attack  - Stroke  - DVT/VTE  - Cardiac arrhythmia  - Respiratory Failure/COPD  - Renal failure  - Anemia  - Advanced Liver disease    Objective:   VITALS:   Vitals:   04/03/20 0203 04/03/20 0604  BP: (!) 143/65 135/70  Pulse: 75 82  Resp: 20 18  Temp: 97.7 F (36.5 C) 97.9 F (36.6 C)  SpO2: 100% 97%    Dorsiflexion/Plantar flexion intact Incision: dressing C/D/I No cellulitis present Compartment soft  LABS Recent Labs    04/03/20 0309  HGB 9.6*  HCT 30.5*  WBC 9.4  PLT 166    Recent Labs    04/03/20 0309  NA 140  K 4.2  BUN 13  CREATININE 0.67  GLUCOSE 132*     Assessment/Plan: 1 Day Post-Op Procedure(s) (LRB): TOTAL KNEE ARTHROPLASTY (Right) Foley cath d/c'ed Advance diet Up with therapy D/C IV fluids Discharge home Follow up in 2 weeks [facility] Follow up with OLIN,Yamili Lichtenwalner D in 2 weeks.  Contact information:  [facility] [address] [place] [id number]    Overweight (BMI 25-29.9) Estimated body mass index is 26.06 kg/m as calculated from the following:   Height as of this encounter: 5\' 2"  (1.575 m).   Weight as of this encounter: 64.6 kg. Patient also counseled that weight may inhibit the healing  process Patient counseled that losing weight will help with future health issues       Danae Orleans PA-C  [facility]  [place] [address], [address], [place], [place] [address] Phone: [phone] [email or web address] Facebook  [employer]

## 2020-04-03 NOTE — Plan of Care (Signed)

## 2020-04-03 NOTE — Progress Notes (Signed)
Physical Therapy Treatment Patient Details Name: [name] MRN: [id number] DOB: 09-12-1941 Today's Date: 04/03/2020    History of Present Illness s/p R TKA    PT Comments    Pt progressing well. Will see for a  Second session for stair practice and pt should be ready for d/c later today   Follow Up Recommendations  Follow surgeon's recommendation for DC plan and follow-up therapies     Equipment Recommendations  3in1 (PT);Rolling walker with 5" wheels    Recommendations for Other Services       Precautions / Restrictions Precautions Precautions: Fall;Knee Restrictions Weight Bearing Restrictions: No Other Position/Activity Restrictions: WBAT    Mobility  Bed Mobility Overal bed mobility: Needs Assistance Bed Mobility: Supine to Sit     Supine to sit: Supervision     General bed mobility comments: for safety  Transfers Overall transfer level: Needs assistance Equipment used: Rolling walker (2 wheeled) Transfers: Sit to/from Stand Sit to Stand: Min guard;Supervision         General transfer comment: cues for  hand placement  Ambulation/Gait Ambulation/Gait assistance: Min guard Gait Distance (Feet): 120 Feet Assistive device: Rolling walker (2 wheeled) Gait Pattern/deviations: Step-to pattern     General Gait Details: cues for sequence and RW position   Stairs             Wheelchair Mobility    Modified Rankin (Stroke Patients Only)       Balance                                            Cognition Arousal/Alertness: Awake/alert Behavior During Therapy: WFL for tasks assessed/performed Overall Cognitive Status: Within Functional Limits for tasks assessed                                        Exercises Total Joint Exercises Ankle Circles/Pumps: AROM;10 reps;Both Quad Sets: AROM;Both;10 reps Heel Slides: AROM;AAROM;Right;10 reps Hip ABduction/ADduction: AROM;Right;10 reps Straight Leg  Raises: AROM;AAROM;Right;10 reps Goniometric ROM: grossly 5 to 80 degrees right knee flexion    General Comments        Pertinent Vitals/Pain Pain Assessment: 0-10 Pain Score: 4  Pain Location: right knee Pain Descriptors / Indicators: Aching;Discomfort;Grimacing Pain Intervention(s): Limited activity within patient's tolerance;Monitored during session;Premedicated before session;Repositioned;Ice applied    Home Living                      Prior Function            PT Goals (current goals can now be found in the care plan section) Acute Rehab PT Goals Patient Stated Goal: home tomorrow PT Goal Formulation: With patient Time For Goal Achievement: 04/09/20 Potential to Achieve Goals: Good Progress towards PT goals: Progressing toward goals    Frequency    7X/week      PT Plan Current plan remains appropriate    Co-evaluation              AM-PAC PT "6 Clicks" Mobility   Outcome Measure  Help needed turning from your back to your side while in a flat bed without using bedrails?: None Help needed moving from lying on your back to sitting on the side of a flat bed without using bedrails?: None Help needed  moving to and from a bed to a chair (including a wheelchair)?: A Little Help needed standing up from a chair using your arms (e.g., wheelchair or bedside chair)?: A Little Help needed to walk in hospital room?: A Little Help needed climbing 3-5 steps with a railing? : A Little 6 Click Score: 20    End of Session Equipment Utilized During Treatment: Gait belt Activity Tolerance: Patient tolerated treatment well Patient left: in chair;with call bell/phone within reach;with chair alarm set Nurse Communication: Mobility status PT Visit Diagnosis: Difficulty in walking, not elsewhere classified (R26.2)     Time: [id number] PT Time Calculation (min) (ACUTE ONLY): 23 min  Charges:  $Gait Training: 8-22 mins $Therapeutic Exercise: 8-22 mins                      Baxter Flattery, PT   Acute Rehab Dept [facility]): [id number]   04/03/2020    [facility] 04/03/2020, 10:04 AM

## 2020-04-03 NOTE — Progress Notes (Signed)
   04/03/20 1400  PT Visit Information  Last PT Received On 04/03/20  Pt continues to progress very well;  Reviewed stairs and transfer safety.   Ready for d/c from PT standpoint   Assistance Needed +1  History of Present Illness s/p R TKA  Subjective Data  Patient Stated Goal home tomorrow  Precautions  Precautions Fall;Knee  Restrictions  Weight Bearing Restrictions No  Other Position/Activity Restrictions WBAT  Pain Assessment  Pain Assessment 0-10  Pain Score 3  Pain Location right knee  Pain Descriptors / Indicators Aching;Discomfort;Grimacing  Pain Intervention(s) Limited activity within patient's tolerance;Monitored during session  Cognition  Arousal/Alertness Awake/alert  Behavior During Therapy WFL for tasks assessed/performed  Overall Cognitive Status Within Functional Limits for tasks assessed  Bed Mobility  Overal bed mobility Needs Assistance  Bed Mobility Supine to Sit  Supine to sit Supervision  General bed mobility comments for safety  Transfers  Overall transfer level Needs assistance  Equipment used Rolling walker (2 wheeled)  Transfers Sit to/from Stand  Sit to Stand Supervision  General transfer comment cues for  hand placement, pt self cues end of session  Ambulation/Gait  Ambulation/Gait assistance Min guard;Supervision  Gait Distance (Feet) 60 Feet  Assistive device Rolling walker (2 wheeled)  Gait Pattern/deviations Step-to pattern  General Gait Details cues for sequence and RW position  Stairs Yes  Stairs assistance Min guard;Min assist  Stair Management Two rails;Step to pattern;Forwards  Number of Stairs 3  General stair comments cues for technique and sequence   PT - End of Session  Equipment Utilized During Treatment Gait belt  Activity Tolerance Patient tolerated treatment well  Patient left in chair;with call bell/phone within reach;with chair alarm set  Nurse Communication Mobility status   PT - Assessment/Plan  PT Plan Current plan  remains appropriate  PT Visit Diagnosis Difficulty in walking, not elsewhere classified (R26.2)  PT Frequency (ACUTE ONLY) 7X/week  Follow Up Recommendations Follow surgeon's recommendation for DC plan and follow-up therapies  PT equipment 3in1 (PT);Rolling walker with 5" wheels  AM-PAC PT "6 Clicks" Mobility Outcome Measure (Version 2)  Help needed turning from your back to your side while in a flat bed without using bedrails? 4  Help needed moving from lying on your back to sitting on the side of a flat bed without using bedrails? 4  Help needed moving to and from a bed to a chair (including a wheelchair)? 3  Help needed standing up from a chair using your arms (e.g., wheelchair or bedside chair)? 3  Help needed to walk in hospital room? 3  Help needed climbing 3-5 steps with a railing?  3  6 Click Score 20  Consider Recommendation of Discharge To: Home with no services  PT Goal Progression  Progress towards PT goals Progressing toward goals  Acute Rehab PT Goals  PT Goal Formulation With patient  Time For Goal Achievement 04/09/20  Potential to Achieve Goals Good  PT Time Calculation  PT Start Time (ACUTE ONLY) 1345  PT Stop Time (ACUTE ONLY) 1358  PT Time Calculation (min) (ACUTE ONLY) 13 min  PT General Charges  $$ ACUTE PT VISIT 1 Visit  PT Treatments  $Gait Training 8-22 mins

## 2020-04-03 NOTE — Plan of Care (Signed)
  Problem: Education: Goal: Knowledge of General Education information will improve Description: Including pain rating scale, medication(s)/side effects and non-pharmacologic comfort measures 04/03/2020 1636 by Hubert Azure, RN Outcome: Adequate for Discharge 04/03/2020 1635 by Hubert Azure, RN Outcome: Progressing   Problem: Health Behavior/Discharge Planning: Goal: Ability to manage health-related needs will improve 04/03/2020 1636 by Hubert Azure, RN Outcome: Adequate for Discharge 04/03/2020 1635 by Hubert Azure, RN Outcome: Progressing   Problem: Clinical Measurements: Goal: Ability to maintain clinical measurements within normal limits will improve 04/03/2020 1636 by Hubert Azure, RN Outcome: Adequate for Discharge 04/03/2020 1635 by Hubert Azure, RN Outcome: Progressing Goal: Will remain free from infection 04/03/2020 1636 by Hubert Azure, RN Outcome: Adequate for Discharge 04/03/2020 1635 by Hubert Azure, RN Outcome: Progressing Goal: Diagnostic test results will improve Outcome: Adequate for Discharge Goal: Cardiovascular complication will be avoided Outcome: Adequate for Discharge   Problem: Activity: Goal: Risk for activity intolerance will decrease Outcome: Adequate for Discharge   Problem: Nutrition: Goal: Adequate nutrition will be maintained Outcome: Adequate for Discharge   Problem: Coping: Goal: Level of anxiety will decrease Outcome: Adequate for Discharge   Problem: Elimination: Goal: Will not experience complications related to bowel motility Outcome: Adequate for Discharge Goal: Will not experience complications related to urinary retention Outcome: Adequate for Discharge   Problem: Pain Managment: Goal: General experience of comfort will improve Outcome: Adequate for Discharge   Problem: Safety: Goal: Ability to remain free from injury will improve Outcome: Adequate for Discharge   Problem: Skin Integrity: Goal: Risk for  impaired skin integrity will decrease Outcome: Adequate for Discharge

## 2020-04-03 NOTE — Discharge Summary (Signed)
[name] MRN: [id number] DOB/AGE: [birth year] 79 y.o.  Admit date: 04/02/2020 Discharge date: 04/03/2020  Admission Diagnoses:  Active Problems:   S/P right TKA   Status post total knee replacement, right   Overweight (BMI 25.0-29.9)   Discharge Diagnoses:  Same  Past Medical History:  Diagnosis Date  . Anemia   . Anxiety    no meds  . Arthritis    hands, knees - no meds  . Bilateral leg weakness   . Complication of anesthesia    Difficult to arouse  . Dental bridge present    perm lower front bottom bridge  . Depression    no meds  . Eyes swollen    and red - was seen by MD for possible allergy to new eye drops  . GERD (gastroesophageal reflux disease)    diet controlled - only takes med prn basis  . Glaucoma   . Hypercholesteremia   . Hypertension   . SVD (spontaneous vaginal delivery)    x 3 - only one currently living  . Vertigo     Surgeries: Procedure(s): TOTAL KNEE ARTHROPLASTY on 04/02/2020   Consultants:   Discharged Condition: Improved  Hospital Course: [name] is an 79 y.o. female who was admitted 04/02/2020 for operative treatment of right knee OA / pain. Patient has severe unremitting pain that affects sleep, daily activities, and work/hobbies. After pre-op clearance the patient was taken to the operating room on 04/02/2020 and underwent  Procedure(s): TOTAL KNEE ARTHROPLASTY.    Patient was given perioperative antibiotics:  Anti-infectives (From admission, onward)   Start     Dose/Rate Route Frequency Ordered Stop   04/02/20 1400  ceFAZolin (ANCEF) IVPB 2g/100 mL premix     2 g 200 mL/hr over 30 Minutes Intravenous Every 6 hours 04/02/20 1159 04/02/20 2226   04/02/20 0600  ceFAZolin (ANCEF) IVPB 2g/100 mL premix     2 g 200 mL/hr over 30 Minutes Intravenous On call to O.R. 04/02/20 0535 04/02/20 [id number]       Patient was given sequential compression devices, early ambulation, and chemoprophylaxis to prevent  DVT.  Patient benefited maximally from hospital stay and there were no complications.    Recent vital signs:  Patient Vitals for the past 24 hrs:  BP Temp Temp src Pulse Resp SpO2  04/03/20 0604 135/70 97.9 F (36.6 C) Oral 82 18 97 %  04/03/20 0203 (!) 143/65 97.7 F (36.5 C) Oral 75 20 100 %  04/02/20 2015 (!) 138/94 (!) 97.5 F (36.4 C) - (!) 101 18 98 %  04/02/20 1559 (!) 152/56 97.6 F (36.4 C) - 84 16 100 %  04/02/20 1411 121/60 (!) 97.5 F (36.4 C) - 72 17 100 %  04/02/20 1258 (!) 166/80 (!) 97.5 F (36.4 C) - 70 16 100 %  04/02/20 1156 (!) 150/66 (!) 97.5 F (36.4 C) - (!) 59 14 (!) 76 %  04/02/20 1135 - 97.8 F (36.6 C) - - - -  04/02/20 1100 125/74 98 F (36.7 C) - (!) 54 15 99 %  04/02/20 1000 (!) 126/58 - - (!) 56 12 100 %  04/02/20 0945 135/64 - - (!) 49 14 100 %  04/02/20 0930 125/72 - - 63 14 100 %  04/02/20 0915 121/60 - - 78 (!) 27 100 %  04/02/20 0910 122/65 97.6 F (36.4 C) - 84 10 100 %     Recent laboratory studies:  Recent Labs    04/03/20  0309  WBC 9.4  HGB 9.6*  HCT 30.5*  PLT 166  NA 140  K 4.2  CL 107  CO2 26  BUN 13  CREATININE 0.67  GLUCOSE 132*  CALCIUM 8.6*     Discharge Medications:   Allergies as of 04/03/2020      Reactions   Codeine    headaches   Combigan [brimonidine Tartrate-timolol]    drowsy   Sulfa Antibiotics Nausea And Vomiting   Metronidazole Itching, Rash      Medication List    TAKE these medications   acetaminophen 500 MG tablet Commonly known as: TYLENOL Take 2 tablets (1,000 mg total) by mouth every 8 (eight) hours. What changed:   when to take this  reasons to take this   Alphagan P 0.1 % Soln Generic drug: brimonidine Place 1 drop into both eyes 3 (three) times daily.   aspirin 81 MG chewable tablet Commonly known as: Aspirin Childrens Chew 1 tablet (81 mg total) by mouth 2 (two) times daily. Take for 4 weeks, then resume regular dose. Start taking on: Apr 04, 2020   baclofen 10 MG  tablet Commonly known as: LIORESAL Take 1 tablet (10 mg total) by mouth 3 (three) times daily.   Biotin 10000 MCG Tabs Take 10,000 mcg by mouth daily.   calcium carbonate 500 MG chewable tablet Commonly known as: TUMS - dosed in mg elemental calcium Chew 1,000 mg by mouth daily as needed for indigestion or heartburn.   docusate sodium 100 MG capsule Commonly known as: Colace Take 1 capsule (100 mg total) by mouth 2 (two) times daily.   famotidine 40 MG tablet Commonly known as: PEPCID TAKE 1 TABLET (40 MG TOTAL) BY MOUTH DAILY AS NEEDED FOR HEARTBURN OR INDIGESTION.   ferrous sulfate 325 (65 FE) MG tablet Commonly known as: FerrouSul Take 1 tablet (325 mg total) by mouth 3 (three) times daily with meals for 14 days.   HYDROmorphone 2 MG tablet Commonly known as: Dilaudid Take 1-2 tablets (2-4 mg total) by mouth every 4 (four) hours as needed for severe pain.   losartan 25 MG tablet Commonly known as: COZAAR Take 1 tablet (25 mg total) by mouth daily.   Lumigan 0.01 % Soln Generic drug: bimatoprost Place 1 drop into both eyes at bedtime.   meclizine 12.5 MG tablet Commonly known as: ANTIVERT Take 1 tablet (12.5 mg total) by mouth 3 (three) times daily as needed for dizziness.   polyethylene glycol 17 g packet Commonly known as: MIRALAX / GLYCOLAX Take 17 g by mouth 2 (two) times daily.   pravastatin 20 MG tablet Commonly known as: PRAVACHOL Take 1 tablet (20 mg total) by mouth daily.   Premarin vaginal cream Generic drug: conjugated estrogens Place 1 application vaginally 2 (two) times a week.   sodium chloride 0.65 % Soln nasal spray Commonly known as: OCEAN Place 1 spray into both nostrils as needed for congestion.   Systane Nighttime Oint Place 1 application into both eyes at bedtime as needed (dry eyes).   SYSTANE OP Place 1 drop into both eyes as needed (dry eyes).            Discharge Care Instructions  (From admission, onward)         Start      Ordered   04/03/20 0000  Change dressing    Comments: Maintain surgical dressing until follow up in the clinic. If the edges start to pull up, may reinforce with tape. If the  dressing is no longer working, may remove and cover with gauze and tape, but must keep the area dry and clean.  Call with any questions or concerns.   04/03/20 0847          Diagnostic Studies: No results found.  Disposition: Discharge disposition: 01-Home or Self Care       Discharge Instructions    Call MD / Call 911   Complete by: As directed    If you experience chest pain or shortness of breath, CALL 911 and be transported to the hospital emergency room.  If you develope a fever above 101 F, pus (white drainage) or increased drainage or redness at the wound, or calf pain, call your surgeon's office.   Change dressing   Complete by: As directed    Maintain surgical dressing until follow up in the clinic. If the edges start to pull up, may reinforce with tape. If the dressing is no longer working, may remove and cover with gauze and tape, but must keep the area dry and clean.  Call with any questions or concerns.   Constipation Prevention   Complete by: As directed    Drink plenty of fluids.  Prune juice may be helpful.  You may use a stool softener, such as Colace (over the counter) 100 mg twice a day.  Use MiraLax (over the counter) for constipation as needed.   Diet - low sodium heart healthy   Complete by: As directed    Discharge instructions   Complete by: As directed    Maintain surgical dressing until follow up in the clinic. If the edges start to pull up, may reinforce with tape. If the dressing is no longer working, may remove and cover with gauze and tape, but must keep the area dry and clean.  Follow up in 2 weeks at [facility]. Call with any questions or concerns.   Increase activity slowly as tolerated   Complete by: As directed    Weight bearing as tolerated with assist device (walker,  cane, etc) as directed, use it as long as suggested by your surgeon or therapist, typically at least 4-6 weeks.   TED hose   Complete by: As directed    Use stockings (TED hose) for 2 weeks on both leg(s).  You may remove them at night for sleeping.      Follow-up Information    Paralee Cancel, MD. Schedule an appointment as soon as possible for a visit in 2 weeks.   Specialty: Orthopedic Surgery Contact information: [address] [place] [address] [id number]            Signed: Lucille Passy [facility] 04/03/2020, 8:59 AM

## 2020-04-04 DIAGNOSIS — M25561 Pain in right knee: Secondary | ICD-10-CM | POA: Diagnosis not present

## 2020-04-04 DIAGNOSIS — R262 Difficulty in walking, not elsewhere classified: Secondary | ICD-10-CM | POA: Diagnosis not present

## 2020-04-08 DIAGNOSIS — R262 Difficulty in walking, not elsewhere classified: Secondary | ICD-10-CM | POA: Diagnosis not present

## 2020-04-08 DIAGNOSIS — M25561 Pain in right knee: Secondary | ICD-10-CM | POA: Diagnosis not present

## 2020-04-11 DIAGNOSIS — R262 Difficulty in walking, not elsewhere classified: Secondary | ICD-10-CM | POA: Diagnosis not present

## 2020-04-11 DIAGNOSIS — M25561 Pain in right knee: Secondary | ICD-10-CM | POA: Diagnosis not present

## 2020-04-16 DIAGNOSIS — R262 Difficulty in walking, not elsewhere classified: Secondary | ICD-10-CM | POA: Diagnosis not present

## 2020-04-16 DIAGNOSIS — M25561 Pain in right knee: Secondary | ICD-10-CM | POA: Diagnosis not present

## 2020-04-18 DIAGNOSIS — R262 Difficulty in walking, not elsewhere classified: Secondary | ICD-10-CM | POA: Diagnosis not present

## 2020-04-18 DIAGNOSIS — M25561 Pain in right knee: Secondary | ICD-10-CM | POA: Diagnosis not present

## 2020-04-19 DIAGNOSIS — H401132 Primary open-angle glaucoma, bilateral, moderate stage: Secondary | ICD-10-CM | POA: Diagnosis not present

## 2020-04-23 DIAGNOSIS — R262 Difficulty in walking, not elsewhere classified: Secondary | ICD-10-CM | POA: Diagnosis not present

## 2020-04-23 DIAGNOSIS — M25561 Pain in right knee: Secondary | ICD-10-CM | POA: Diagnosis not present

## 2020-04-23 DIAGNOSIS — H401132 Primary open-angle glaucoma, bilateral, moderate stage: Secondary | ICD-10-CM | POA: Diagnosis not present

## 2020-04-25 DIAGNOSIS — M25561 Pain in right knee: Secondary | ICD-10-CM | POA: Diagnosis not present

## 2020-04-25 DIAGNOSIS — R262 Difficulty in walking, not elsewhere classified: Secondary | ICD-10-CM | POA: Diagnosis not present

## 2020-04-30 DIAGNOSIS — M25561 Pain in right knee: Secondary | ICD-10-CM | POA: Diagnosis not present

## 2020-04-30 DIAGNOSIS — R262 Difficulty in walking, not elsewhere classified: Secondary | ICD-10-CM | POA: Diagnosis not present

## 2020-05-02 DIAGNOSIS — M25561 Pain in right knee: Secondary | ICD-10-CM | POA: Diagnosis not present

## 2020-05-02 DIAGNOSIS — R262 Difficulty in walking, not elsewhere classified: Secondary | ICD-10-CM | POA: Diagnosis not present

## 2020-05-07 DIAGNOSIS — M25561 Pain in right knee: Secondary | ICD-10-CM | POA: Diagnosis not present

## 2020-05-07 DIAGNOSIS — R262 Difficulty in walking, not elsewhere classified: Secondary | ICD-10-CM | POA: Diagnosis not present

## 2020-05-09 DIAGNOSIS — R262 Difficulty in walking, not elsewhere classified: Secondary | ICD-10-CM | POA: Diagnosis not present

## 2020-05-09 DIAGNOSIS — M25561 Pain in right knee: Secondary | ICD-10-CM | POA: Diagnosis not present

## 2020-05-14 DIAGNOSIS — R262 Difficulty in walking, not elsewhere classified: Secondary | ICD-10-CM | POA: Diagnosis not present

## 2020-05-14 DIAGNOSIS — M25561 Pain in right knee: Secondary | ICD-10-CM | POA: Diagnosis not present

## 2020-05-16 DIAGNOSIS — M25561 Pain in right knee: Secondary | ICD-10-CM | POA: Diagnosis not present

## 2020-05-16 DIAGNOSIS — R262 Difficulty in walking, not elsewhere classified: Secondary | ICD-10-CM | POA: Diagnosis not present

## 2020-05-17 DIAGNOSIS — Z471 Aftercare following joint replacement surgery: Secondary | ICD-10-CM | POA: Diagnosis not present

## 2020-05-17 DIAGNOSIS — Z96651 Presence of right artificial knee joint: Secondary | ICD-10-CM | POA: Diagnosis not present

## 2020-05-21 DIAGNOSIS — R262 Difficulty in walking, not elsewhere classified: Secondary | ICD-10-CM | POA: Diagnosis not present

## 2020-05-21 DIAGNOSIS — M25561 Pain in right knee: Secondary | ICD-10-CM | POA: Diagnosis not present

## 2020-05-23 DIAGNOSIS — M25561 Pain in right knee: Secondary | ICD-10-CM | POA: Diagnosis not present

## 2020-05-23 DIAGNOSIS — R262 Difficulty in walking, not elsewhere classified: Secondary | ICD-10-CM | POA: Diagnosis not present

## 2020-05-24 ENCOUNTER — Telehealth: Payer: Self-pay | Admitting: Family Medicine

## 2020-05-24 NOTE — Chronic Care Management (AMB) (Signed)
  Chronic Care Management   Note  05/24/2020 Name: ZHARIA CONROW MRN: [id number] DOB: 05-Jul-1941  Laisa Larrick Lio is a 79 y.o. year old female who is a primary care patient of Steele Sizer, MD. I reached out to [place] by phone today in response to a referral sent by Ms. Rande Brunt Cabal's health plan.     Ms. Gosch was given information about Chronic Care Management services today including:  1. CCM service includes personalized support from designated clinical staff supervised by her physician, including individualized plan of care and coordination with other care providers 2. 24/7 contact phone numbers for assistance for urgent and routine care needs. 3. Service will only be billed when office clinical staff spend 20 minutes or more in a month to coordinate care. 4. Only one practitioner may furnish and bill the service in a calendar month. 5. The patient may stop CCM services at any time (effective at the end of the month) by phone call to the office staff. 6. The patient will be responsible for cost sharing (co-pay) of up to 20% of the service fee (after annual deductible is met).  Patient agreed to services and verbal consent obtained.   Follow up plan: Telephone appointment with care management team member scheduled for: 06/19/2020  [place], [place], [place] [address] Direct Dial: [email or web address] Website: [email or web address]

## 2020-05-28 DIAGNOSIS — R262 Difficulty in walking, not elsewhere classified: Secondary | ICD-10-CM | POA: Diagnosis not present

## 2020-05-28 DIAGNOSIS — M25561 Pain in right knee: Secondary | ICD-10-CM | POA: Diagnosis not present

## 2020-06-19 ENCOUNTER — Telehealth: Payer: Self-pay

## 2020-06-19 ENCOUNTER — Telehealth: Payer: Medicare HMO

## 2020-06-19 NOTE — Telephone Encounter (Signed)
  Chronic Care Management   Outreach Note  06/19/2020 Name: [name] MRN: [id number] DOB: 03/13/1941  Primary Care Provider: Steele Sizer, MD Reason for referral : Chronic Care Management   An unsuccessful telephone outreach was attempted today. [name] was referred to the case management team for assistance with care management and care coordination.    PLAN The care management team will reach out to [name] again within the next two weeks.   [facility] Care Management [phone]

## 2020-07-01 ENCOUNTER — Ambulatory Visit: Payer: Medicare HMO | Admitting: Family Medicine

## 2020-07-18 ENCOUNTER — Other Ambulatory Visit: Payer: Self-pay | Admitting: Family Medicine

## 2020-07-18 DIAGNOSIS — Z1231 Encounter for screening mammogram for malignant neoplasm of breast: Secondary | ICD-10-CM

## 2020-07-23 DIAGNOSIS — N952 Postmenopausal atrophic vaginitis: Secondary | ICD-10-CM | POA: Diagnosis not present

## 2020-07-23 DIAGNOSIS — Z6826 Body mass index (BMI) 26.0-26.9, adult: Secondary | ICD-10-CM | POA: Diagnosis not present

## 2020-07-23 DIAGNOSIS — Z124 Encounter for screening for malignant neoplasm of cervix: Secondary | ICD-10-CM | POA: Diagnosis not present

## 2020-07-31 ENCOUNTER — Telehealth: Payer: Self-pay

## 2020-07-31 NOTE — Telephone Encounter (Signed)
  Chronic Care Management   Outreach Note  07/31/2020 Name: [name] MRN: [id number] DOB: [birth year]  Primary Care Provider: Steele Sizer, MD Reason for referral : Chronic Care Management   An unsuccessful telephone outreach was attempted today. [name] was referred to the case management team for assistance with care management and care coordination.    PLAN The care management team will reach out to [name] again within the next two weeks.   [name] [phone]

## 2020-08-02 NOTE — Progress Notes (Deleted)
Name: [name]   MRN: [id number]    DOB: Sep 30, 1941   Date:08/02/2020       Progress Note  Subjective  Chief Complaint  No chief complaint on file.   HPI  OAright knee: going on for years, used to see Dr. Marlou Sa and has a history of left knee meniscal tear repair,she saw Dr. Marlou Sa 07/2018 and diagnosed with  severe OA but told her there was nothing else she could not for her, so she decided to see someone else.  Since Feb 2021 got progressively worse, she has genu valgum, she was thinking about stem cell and synvisc but because of the severity and progression of pain she decided to have surgery , scheduled from May 18 th 2021 by Dr. Alvan Dame - [facility],  who requested her PO clearance . She denies any reactions to anesthesia, she does not have dentures or partial plates on upper jaw, but has some on lower teeth ( possible implants? ) . She denies decrease in exercise tolerance. No chest pain or palpitation. Last year she saw Dr. Rockey Situ for atypical chest pain and dizziness, but was given reassurance, normal EKG.   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 No radiculitis. No dysuria , bowel or bladder incontinence   Patient Active Problem List   Diagnosis Date Noted  . Overweight (BMI 25.0-29.9) 04/03/2020  . S/P right TKA 04/02/2020  . Status post total knee replacement, right 04/02/2020  . Senile purpura ([place]) 12/30/2018  . S/P vaginal hysterectomy 05/30/2018  . Recurrent major depressive disorder, in full remission ([place]) 03/02/2018  . GERD (gastroesophageal reflux disease) 10/20/2016  . Anxiety, generalized 01/28/2016  . Osteoarthritis of right knee 12/25/2015  . Depression 09/05/2015  . Abnormal brain CT 06/20/2015  . Anxiety, mild 06/20/2015  . Brain lesion 06/20/2015  . Clinical depression 06/20/2015   . Dyslipidemia 06/20/2015  . Hyperlipidemia LDL goal <100 06/20/2015  . Benign hypertension 06/20/2015  . Affective disorder, major 06/20/2015  . Arthralgia of shoulder 06/20/2015  . Glaucoma 06/20/2015  . Post-menopausal 06/04/2015  . Bilateral leg weakness 06/04/2015    Past Surgical History:  Procedure Laterality Date  . ANAL FISSURE REPAIR  1970s   for bleeding in 1970s  . ANTERIOR AND POSTERIOR REPAIR WITH SACROSPINOUS FIXATION N/A 05/30/2018   Procedure: ANTERIOR AND POSTERIOR REPAIR;  Surgeon: Louretta Shorten, MD;  Location: [place];  Service: Gynecology;  Laterality: N/A;  . COLONOSCOPY  2012   [place]  . COLONOSCOPY WITH PROPOFOL N/A 09/01/2016   Procedure: COLONOSCOPY WITH PROPOFOL;  Surgeon: Robert Bellow, MD;  Location: [facility] ENDOSCOPY;  Service: Endoscopy;  Laterality: N/A;  . EYE SURGERY Bilateral    cataracts removed  . KNEE SURGERY Left   . SALPINGOOPHORECTOMY Right 05/30/2018   Procedure: SALPINGO OOPHORECTOMY;  Surgeon: Louretta Shorten, MD;  Location: [place] [facility];  Service: Gynecology;  Laterality: Right;  . SUBCLAVIAN ANGIOGRAM    . TOTAL KNEE ARTHROPLASTY Right 04/02/2020   Procedure: TOTAL KNEE ARTHROPLASTY;  Surgeon: Paralee Cancel, MD;  Location: [facility];  Service: Orthopedics;  Laterality: Right;  70 mins  . UPPER GI ENDOSCOPY  2012  . VAGINAL HYSTERECTOMY N/A 05/30/2018   Procedure: HYSTERECTOMY VAGINAL;  Surgeon: Louretta Shorten, MD;  Location: [place] [facility];  Service: Gynecology;  Laterality: N/A;  . WISDOM TOOTH EXTRACTION  Family History  Problem Relation Age of Onset  . Cancer Mother 40       colon  . Alzheimer's disease Mother   . Hypertension Mother   . Cancer Brother        prostate  . Lymphoma Son   . Cancer Son 54       lymphoma  . Hypertension Father   . Cerebral aneurysm Father   . Diabetes Sister   . Depression Sister   . Hypertension Sister   . Depression Sister   . Cancer Brother   . Gallbladder disease Brother   . Prostate cancer Brother   .  Breast cancer Neg Hx     Social History   Tobacco Use  . Smoking status: Never Smoker  . Smokeless tobacco: Never Used  . Tobacco comment: smoking cessation materials not required  Substance Use Topics  . Alcohol use: Yes    Alcohol/week: 0.0 standard drinks    Comment: occasional beer/mixed drink     Current Outpatient Medications:  .  acetaminophen (TYLENOL) 500 MG tablet, Take 2 tablets (1,000 mg total) by mouth every 8 (eight) hours., Disp: 30 tablet, Rfl: 0 .  ALPHAGAN P 0.1 % SOLN, Place 1 drop into both eyes 3 (three) times daily. , Disp: , Rfl:  .  baclofen (LIORESAL) 10 MG tablet, Take 1 tablet (10 mg total) by mouth 3 (three) times daily., Disp: 30 each, Rfl: 0 .  bimatoprost (LUMIGAN) 0.01 % SOLN, Place 1 drop into both eyes at bedtime., Disp: , Rfl:  .  Biotin 10000 MCG TABS, Take 10,000 mcg by mouth daily. , Disp: , Rfl:  .  calcium carbonate (TUMS - DOSED IN MG ELEMENTAL CALCIUM) 500 MG chewable tablet, Chew 1,000 mg by mouth daily as needed for indigestion or heartburn., Disp: , Rfl:  .  docusate sodium (COLACE) 100 MG capsule, Take 1 capsule (100 mg total) by mouth 2 (two) times daily., Disp: 28 capsule, Rfl: 0 .  famotidine (PEPCID) 40 MG tablet, TAKE 1 TABLET (40 MG TOTAL) BY MOUTH DAILY AS NEEDED FOR HEARTBURN OR INDIGESTION., Disp: 90 tablet, Rfl: 0 .  ferrous sulfate (FERROUSUL) 325 (65 FE) MG tablet, Take 1 tablet (325 mg total) by mouth 3 (three) times daily with meals for 14 days., Disp: 42 tablet, Rfl: 0 .  HYDROmorphone (DILAUDID) 2 MG tablet, Take 1-2 tablets (2-4 mg total) by mouth every 4 (four) hours as needed for severe pain., Disp: 60 tablet, Rfl: 0 .  losartan (COZAAR) 25 MG tablet, Take 1 tablet (25 mg total) by mouth daily., Disp: 90 tablet, Rfl: 1 .  meclizine (ANTIVERT) 12.5 MG tablet, Take 1 tablet (12.5 mg total) by mouth 3 (three) times daily as needed for dizziness., Disp: 10 tablet, Rfl: 0 .  Polyethyl Glycol-Propyl Glycol (SYSTANE OP), Place 1  drop into both eyes as needed (dry eyes)., Disp: , Rfl:  .  polyethylene glycol (MIRALAX / GLYCOLAX) 17 g packet, Take 17 g by mouth 2 (two) times daily., Disp: 28 packet, Rfl: 0 .  pravastatin (PRAVACHOL) 20 MG tablet, Take 1 tablet (20 mg total) by mouth daily., Disp: 90 tablet, Rfl: 1 .  PREMARIN vaginal cream, Place 1 application vaginally 2 (two) times a week., Disp: , Rfl: 12 .  sodium chloride (OCEAN) 0.65 % SOLN nasal spray, Place 1 spray into both nostrils as needed for congestion., Disp: , Rfl:  .  White Petrolatum-Mineral Oil (SYSTANE NIGHTTIME) OINT, Place 1 application into both eyes at  bedtime as needed (dry eyes)., Disp: , Rfl:   Allergies  Allergen Reactions  . Codeine     headaches  . Combigan [Brimonidine Tartrate-Timolol]     drowsy  . Sulfa Antibiotics Nausea And Vomiting  . Metronidazole Itching and Rash    I personally reviewed {Reviewed:14835} with the patient/caregiver today.   ROS  ***  Objective  There were no vitals filed for this visit.  There is no height or weight on file to calculate BMI.  Physical Exam ***  No results found for this or any previous visit (from the past 2160 hour(s)).  Diabetic Foot Exam: Diabetic Foot Exam - Simple   No data filed     ***  PHQ2/9: Depression screen [facility] 2/9 03/19/2020 01/25/2020 01/01/2020 06/30/2019 06/02/2019  Decreased Interest 0 0 0 0 0  Down, Depressed, Hopeless 1 0 0 0 0  PHQ - 2 Score 1 0 0 0 0  Altered sleeping 0 - 0 0 0  Tired, decreased energy 0 - 2 0 0  Change in appetite 0 - 0 0 0  Feeling bad or failure about yourself  0 - 0 0 0  Trouble concentrating 1 - 0 0 0  Moving slowly or fidgety/restless 0 - 0 0 0  Suicidal thoughts 0 - 0 0 0  PHQ-9 Score 2 - 2 0 0  Difficult doing work/chores Not difficult at all - Not difficult at all - Not difficult at all  Some recent data might be hidden    phq 9 is {gen pos [id number]} ***  Fall Risk: Fall Risk  03/19/2020 01/25/2020 01/01/2020 06/30/2019  06/02/2019  Falls in the past year? 0 0 0 0 0  Comment - - - - -  Number falls in past yr: 0 0 0 0 0  Injury with Fall? 0 0 0 0 0  Risk for fall due to : - No Fall Risks - - -  Risk for fall due to: Comment - - - - -  Follow up - Falls prevention discussed - - -   ***   Functional Status Survey:   ***   Assessment & Plan  *** There are no diagnoses linked to this encounter.

## 2020-08-05 ENCOUNTER — Ambulatory Visit: Payer: Medicare HMO | Admitting: Family Medicine

## 2020-08-05 ENCOUNTER — Other Ambulatory Visit: Payer: Self-pay | Admitting: Legal Medicine

## 2020-08-08 ENCOUNTER — Other Ambulatory Visit: Payer: Self-pay

## 2020-08-08 ENCOUNTER — Ambulatory Visit
Admission: RE | Admit: 2020-08-08 | Discharge: 2020-08-08 | Disposition: A | Discharge status: Home / Self Care | Payer: Medicare HMO | Source: Ambulatory Visit | Attending: Family Medicine | Admitting: Family Medicine

## 2020-08-08 DIAGNOSIS — Z1231 Encounter for screening mammogram for malignant neoplasm of breast: Secondary | ICD-10-CM | POA: Insufficient documentation

## 2020-08-16 ENCOUNTER — Ambulatory Visit: Payer: Self-pay

## 2020-08-16 NOTE — Chronic Care Management (AMB) (Signed)
  Chronic Care Management   Outreach Note  08/16/2020 Name: [name] MRN: [id number] DOB: 01-19-1941  Primary Care Provider: Steele Sizer, MD Reason for referral : Chronic Care Management   [name] was referred to the care management team for assistance with chronic care management and care coordination. Her primary care provider will be notified of our unsuccessful attempts to establish contact. The care management team will gladly outreach at any time in the future if she is interested in receiving assistance.    PLAN The care management team will gladly follow up with [name] after the primary care provider has a conversation with her regarding recommendation for care management engagement and subsequent re-referral for care management services.    [name] [phone]

## 2020-08-23 NOTE — Progress Notes (Signed)
Name: [name]   MRN: [id number]    DOB: 1941/01/22   Date:08/26/2020       Progress Note  Subjective  Chief Complaint  Chief Complaint  Patient presents with  . Follow-up  . Hypertension  . Depression    HPI  History of Total Right knee Replacement: she had it done in May 2021, she finished PT and is going to the gym, she has good rom and pain is mild only taking Tylenol otc prn. She has noticed fatigue after surgery, she developed anemia, we will recheck labs today She denies sob , just feels tired   HTN:she has been taking losartan and bp is at goal now, no chest pain or palpitation, no orthostatic hypotension. Continue current regiment   Hyperlipidemia: taking pravastatin and denies side effects of medication, including no myalgia. Last LDL was at goal at 74, continue medication, we will recheck labs today   Depression Major Chronic: she states doing well, still misses her son , that died at age 2from [place] in 52. Her husband died 6 years before, on the same day on October 14 th. She has two grandchildren from him and two other grandchildren from her youngest son.She states she really struggles from October until the end of December, she states in January she started to feel better. She does not want to take anything daily but willing to take something prn. I gave her Buspar to take prn in the past but she states she did not like the side effects so she stopped. Discussed grieve counseling through hospice   GERD:she has stopped omeprazole but is now on Pepcid prn and is doing well , only taking it occasionally   Hyperglycemia: A1C was 5.8%but last visit was 5.4% Denies polyphagia, polydipsia or polyuria. She has episodes of hypoglycemia and family history of diabetes, discussed following a diabetic diet to keep her glucose at goal   Left sinus pressure: she went to the dentist with left sinus pressure last week, she was not given antibiotics, she  states she has left sinus pressure intermittent, sometimes post-nasal drainage. She has intermittent sharp pain on left side but not constant, no fever or chills.   Senile purpura: on both arms, gave her reassurance  Left upper eye lid: has a nodule and pain to touch, using warm compresses, started one week ago, discussed continue that and if worse go to eye doctor   Patient Active Problem List   Diagnosis Date Noted  . Overweight (BMI 25.0-29.9) 04/03/2020  . S/P right TKA 04/02/2020  . Status post total knee replacement, right 04/02/2020  . Pain in right knee 02/23/2020  . Senile purpura ([place]) 12/30/2018  . S/P vaginal hysterectomy 05/30/2018  . Recurrent major depressive disorder, in full remission ([place]) 03/02/2018  . GERD (gastroesophageal reflux disease) 10/20/2016  . Anxiety, generalized 01/28/2016  . Osteoarthritis of right knee 12/25/2015  . Depression 09/05/2015  . Abnormal brain CT 06/20/2015  . Anxiety, mild 06/20/2015  . Brain lesion 06/20/2015  . Clinical depression 06/20/2015  . Dyslipidemia 06/20/2015  . Hyperlipidemia LDL goal <100 06/20/2015  . Benign hypertension 06/20/2015  . Affective disorder, major 06/20/2015  . Arthralgia of shoulder 06/20/2015  . Glaucoma 06/20/2015  . Post-menopausal 06/04/2015  . Bilateral leg weakness 06/04/2015    Past Surgical History:  Procedure Laterality Date  . ANAL FISSURE REPAIR  1970s   for bleeding in 1970s  . ANTERIOR AND POSTERIOR REPAIR WITH SACROSPINOUS FIXATION N/A 05/30/2018   Procedure: ANTERIOR  AND POSTERIOR REPAIR;  Surgeon: Louretta Shorten, MD;  Location: [facility];  Service: Gynecology;  Laterality: N/A;  . COLONOSCOPY  2012   [place]  . COLONOSCOPY WITH PROPOFOL N/A 09/01/2016   Procedure: COLONOSCOPY WITH PROPOFOL;  Surgeon: Robert Bellow, MD;  Location: [facility] ENDOSCOPY;  Service: Endoscopy;  Laterality: N/A;  . EYE SURGERY Bilateral    cataracts removed  . KNEE SURGERY Left   . SALPINGOOPHORECTOMY Right  05/30/2018   Procedure: SALPINGO OOPHORECTOMY;  Surgeon: Louretta Shorten, MD;  Location: [facility];  Service: Gynecology;  Laterality: Right;  . SUBCLAVIAN ANGIOGRAM    . TOTAL KNEE ARTHROPLASTY Right 04/02/2020   Procedure: TOTAL KNEE ARTHROPLASTY;  Surgeon: Paralee Cancel, MD;  Location: [facility];  Service: Orthopedics;  Laterality: Right;  70 mins  . UPPER GI ENDOSCOPY  2012  . VAGINAL HYSTERECTOMY N/A 05/30/2018   Procedure: HYSTERECTOMY VAGINAL;  Surgeon: Louretta Shorten, MD;  Location: [place] [facility];  Service: Gynecology;  Laterality: N/A;  . WISDOM TOOTH EXTRACTION      Family History  Problem Relation Age of Onset  . Cancer Mother 9       colon  . Alzheimer's disease Mother   . Hypertension Mother   . Cancer Brother        prostate  . Lymphoma Son   . Cancer Son 72       lymphoma  . Hypertension Father   . Cerebral aneurysm Father   . Diabetes Sister   . Depression Sister   . Hypertension Sister   . Depression Sister   . Cancer Brother   . Gallbladder disease Brother   . Prostate cancer Brother   . Breast cancer Neg Hx     Social History   Tobacco Use  . Smoking status: Never Smoker  . Smokeless tobacco: Never Used  . Tobacco comment: smoking cessation materials not required  Substance Use Topics  . Alcohol use: Yes    Alcohol/week: 0.0 standard drinks    Comment: occasional beer/mixed drink     Current Outpatient Medications:  .  ALPHAGAN P 0.1 % SOLN, Place 1 drop into both eyes 3 (three) times daily. , Disp: , Rfl:  .  amoxicillin (AMOXIL) 500 MG capsule, , Disp: , Rfl:  .  bimatoprost (LUMIGAN) 0.01 % SOLN, Place 1 drop into both eyes at bedtime., Disp: , Rfl:  .  Biotin 10000 MCG TABS, Take 10,000 mcg by mouth daily. , Disp: , Rfl:  .  famotidine (PEPCID) 40 MG tablet, TAKE 1 TABLET (40 MG TOTAL) BY MOUTH DAILY AS NEEDED FOR HEARTBURN OR INDIGESTION., Disp: 90 tablet, Rfl: 0 .  HYDROcodone-acetaminophen (NORCO/VICODIN) 5-325 MG tablet, , Disp: , Rfl:  .  losartan (COZAAR)  25 MG tablet, Take 1 tablet (25 mg total) by mouth daily., Disp: 90 tablet, Rfl: 1 .  meclizine (ANTIVERT) 12.5 MG tablet, Take 1 tablet (12.5 mg total) by mouth 3 (three) times daily as needed for dizziness., Disp: 10 tablet, Rfl: 0 .  nystatin-triamcinolone ointment (MYCOLOG), , Disp: , Rfl:  .  Polyethyl Glycol-Propyl Glycol (SYSTANE OP), Place 1 drop into both eyes as needed (dry eyes)., Disp: , Rfl:  .  pravastatin (PRAVACHOL) 20 MG tablet, Take 1 tablet (20 mg total) by mouth daily., Disp: 90 tablet, Rfl: 1 .  PREMARIN vaginal cream, Place 1 application vaginally 2 (two) times a week., Disp: , Rfl: 12 .  traMADol (ULTRAM) 50 MG tablet, , Disp: , Rfl:  .  White Petrolatum-Mineral Oil (SYSTANE  NIGHTTIME) OINT, Place 1 application into both eyes at bedtime as needed (dry eyes)., Disp: , Rfl:  .  acetaminophen (TYLENOL) 500 MG tablet, Take 2 tablets (1,000 mg total) by mouth every 8 (eight) hours. (Patient not taking: Reported on 08/26/2020), Disp: 30 tablet, Rfl: 0 .  baclofen (LIORESAL) 10 MG tablet, Take 1 tablet (10 mg total) by mouth 3 (three) times daily. (Patient not taking: Reported on 08/26/2020), Disp: 30 each, Rfl: 0 .  calcium carbonate (TUMS - DOSED IN MG ELEMENTAL CALCIUM) 500 MG chewable tablet, Chew 1,000 mg by mouth daily as needed for indigestion or heartburn. (Patient not taking: Reported on 08/26/2020), Disp: , Rfl:  .  docusate sodium (COLACE) 100 MG capsule, Take 1 capsule (100 mg total) by mouth 2 (two) times daily. (Patient not taking: Reported on 08/26/2020), Disp: 28 capsule, Rfl: 0 .  ferrous sulfate (FERROUSUL) 325 (65 FE) MG tablet, Take 1 tablet (325 mg total) by mouth 3 (three) times daily with meals for 14 days., Disp: 42 tablet, Rfl: 0 .  HYDROmorphone (DILAUDID) 2 MG tablet, Take 1-2 tablets (2-4 mg total) by mouth every 4 (four) hours as needed for severe pain. (Patient not taking: Reported on 08/26/2020), Disp: 60 tablet, Rfl: 0 .  polyethylene glycol (MIRALAX /  GLYCOLAX) 17 g packet, Take 17 g by mouth 2 (two) times daily. (Patient not taking: Reported on 08/26/2020), Disp: 28 packet, Rfl: 0 .  sodium chloride (OCEAN) 0.65 % SOLN nasal spray, Place 1 spray into both nostrils as needed for congestion. (Patient not taking: Reported on 08/26/2020), Disp: , Rfl:   Allergies  Allergen Reactions  . Codeine     headaches  . Combigan [Brimonidine Tartrate-Timolol]     drowsy  . Sulfa Antibiotics Nausea And Vomiting  . Metronidazole Itching and Rash    I personally reviewed active problem list, medication list, allergies, family history, social history, health maintenance with the patient/caregiver today.   ROS  Constitutional: Negative for fever or weight change.  Respiratory: Negative for cough and shortness of breath.   Cardiovascular: Negative for chest pain or palpitations.  Gastrointestinal: Negative for abdominal pain, no bowel changes.  Musculoskeletal: recent right knee replacement, but doing well  Skin: Negative for rash.  Neurological: Negative for dizziness or headache.  No other specific complaints in a complete review of systems (except as listed in HPI above).   Objective  Vitals:   08/26/20 0935  BP: 136/88  Pulse: 86  Resp: 14  Temp: 98.2 F (36.8 C)  TempSrc: Oral  SpO2: 97%  Weight: 141 lb 12.8 oz (64.3 kg)  Height: 5\' 2"  (1.575 m)    Body mass index is 25.94 kg/m.  Physical Exam  Constitutional: Patient appears well-developed and well-nourished.  No distress.  HEENT: head atraumatic, normocephalic, pupils equal and reactive to light,  neck supple Cardiovascular: Normal rate, regular rhythm and normal heart sounds.  No murmur heard. No BLE edema. Pulmonary/Chest: Effort normal and breath sounds normal. No respiratory distress. Muscular skeletal: good rom of both knees  Abdominal: Soft.  There is no tenderness. Psychiatric: Patient has a normal mood and affect. behavior is normal. Judgment and thought content  normal.  PHQ2/9: Depression screen [facility] 2/9 08/26/2020 03/19/2020 01/25/2020 01/01/2020 06/30/2019  Decreased Interest 0 0 0 0 0  Down, Depressed, Hopeless 2 1 0 0 0  PHQ - 2 Score 2 1 0 0 0  Altered sleeping 0 0 - 0 0  Tired, decreased energy 2 0 - 2  0  Change in appetite 0 0 - 0 0  Feeling bad or failure about yourself  0 0 - 0 0  Trouble concentrating 1 1 - 0 0  Moving slowly or fidgety/restless 0 0 - 0 0  Suicidal thoughts 0 0 - 0 0  PHQ-9 Score 5 2 - 2 0  Difficult doing work/chores Not difficult at all Not difficult at all - Not difficult at all -  Some recent data might be hidden    phq 9 is positive   Fall Risk: Fall Risk  08/26/2020 03/19/2020 01/25/2020 01/01/2020 06/30/2019  Falls in the past year? 0 0 0 0 0  Comment - - - - -  Number falls in past yr: 0 0 0 0 0  Injury with Fall? 0 0 0 0 0  Risk for fall due to : - - No Fall Risks - -  Risk for fall due to: Comment - - - - -  Follow up - - Falls prevention discussed - -     Functional Status Survey: Is the patient deaf or have difficulty hearing?: No Does the patient have difficulty seeing, even when wearing glasses/contacts?: No Does the patient have difficulty concentrating, remembering, or making decisions?: No Does the patient have difficulty walking or climbing stairs?: No Does the patient have difficulty dressing or bathing?: No Does the patient have difficulty doing errands alone such as visiting a doctor's office or shopping?: No    Assessment & Plan  1. Need for hepatitis C screening test  - Hepatitis C antibody  2. Essential hypertension  - losartan (COZAAR) 25 MG tablet; Take 1 tablet (25 mg total) by mouth daily.  Dispense: 90 tablet; Refill: 1 - CBC with Differential/Platelet - COMPLETE METABOLIC PANEL WITH GFR  3. Dyslipidemia  - pravastatin (PRAVACHOL) 20 MG tablet; Take 1 tablet (20 mg total) by mouth daily.  Dispense: 90 tablet; Refill: 1 - Lipid panel  4. Senile purpura  ([place])  Reassurance   5. Hyperglycemia   6. History of right knee joint replacement   7. Sinus pressure  - fluticasone (FLONASE) 50 MCG/ACT nasal spray; Place 2 sprays into both nostrils daily.  Dispense: 16 g; Refill: 0  8. Hordeolum internum of left upper eyelid  Go to eye doctor if needed

## 2020-08-26 ENCOUNTER — Other Ambulatory Visit: Payer: Self-pay

## 2020-08-26 ENCOUNTER — Encounter: Payer: Self-pay | Admitting: Family Medicine

## 2020-08-26 ENCOUNTER — Ambulatory Visit (INDEPENDENT_AMBULATORY_CARE_PROVIDER_SITE_OTHER): Payer: Medicare HMO | Admitting: Family Medicine

## 2020-08-26 VITALS — BP 136/88 | HR 86 | Temp 98.2°F | Resp 14 | Ht 62.0 in | Wt 141.8 lb

## 2020-08-26 DIAGNOSIS — E785 Hyperlipidemia, unspecified: Secondary | ICD-10-CM

## 2020-08-26 DIAGNOSIS — R739 Hyperglycemia, unspecified: Secondary | ICD-10-CM

## 2020-08-26 DIAGNOSIS — Z23 Encounter for immunization: Secondary | ICD-10-CM | POA: Diagnosis not present

## 2020-08-26 DIAGNOSIS — J3489 Other specified disorders of nose and nasal sinuses: Secondary | ICD-10-CM

## 2020-08-26 DIAGNOSIS — I10 Essential (primary) hypertension: Secondary | ICD-10-CM

## 2020-08-26 DIAGNOSIS — Z96651 Presence of right artificial knee joint: Secondary | ICD-10-CM

## 2020-08-26 DIAGNOSIS — D692 Other nonthrombocytopenic purpura: Secondary | ICD-10-CM

## 2020-08-26 DIAGNOSIS — Z1159 Encounter for screening for other viral diseases: Secondary | ICD-10-CM

## 2020-08-26 DIAGNOSIS — H00024 Hordeolum internum left upper eyelid: Secondary | ICD-10-CM

## 2020-08-26 MED ORDER — LOSARTAN POTASSIUM 25 MG PO TABS: 25.0000 mg | ORAL_TABLET | Freq: Every day | ORAL | 1 refills | Status: DC

## 2020-08-26 MED ORDER — FLUTICASONE PROPIONATE 50 MCG/ACT NA SUSP: 2.0000 | Freq: Every day | NASAL | 0 refills | Status: DC

## 2020-08-26 MED ORDER — PRAVASTATIN SODIUM 20 MG PO TABS: 20.0000 mg | ORAL_TABLET | Freq: Every day | ORAL | 1 refills | Status: DC

## 2020-08-27 LAB — CBC WITH DIFFERENTIAL/PLATELET
Absolute Monocytes: 345 cells/uL (ref 200–950)
Basophils Absolute: 28 cells/uL (ref 0–200)
Basophils Relative: 0.4 %
Eosinophils Absolute: 104 cells/uL (ref 15–500)
Eosinophils Relative: 1.5 %
HCT: 40.4 % (ref 35.0–45.0)
Hemoglobin: 13.2 g/dL (ref 11.7–15.5)
Lymphs Abs: 1835 cells/uL (ref 850–3900)
MCH: 27.4 pg (ref 27.0–33.0)
MCHC: 32.7 g/dL (ref 32.0–36.0)
MCV: 83.8 fL (ref 80.0–100.0)
MPV: 10.1 fL (ref 7.5–12.5)
Monocytes Relative: 5 %
Neutro Abs: 4589 cells/uL (ref 1500–7800)
Neutrophils Relative %: 66.5 %
Platelets: 267 10*3/uL (ref 140–400)
RBC: 4.82 10*6/uL (ref 3.80–5.10)
RDW: 14.2 % (ref 11.0–15.0)
Total Lymphocyte: 26.6 %
WBC: 6.9 10*3/uL (ref 3.8–10.8)

## 2020-08-27 LAB — COMPLETE METABOLIC PANEL WITH GFR
AG Ratio: 1.8 (calc) (ref 1.0–2.5)
ALT: 13 U/L (ref 6–29)
AST: 16 U/L (ref 10–35)
Albumin: 4.4 g/dL (ref 3.6–5.1)
Alkaline phosphatase (APISO): 56 U/L (ref 37–153)
BUN: 15 mg/dL (ref 7–25)
CO2: 29 mmol/L (ref 20–32)
Calcium: 9.9 mg/dL (ref 8.6–10.4)
Chloride: 106 mmol/L (ref 98–110)
Creat: 0.8 mg/dL (ref 0.60–0.93)
GFR, Est African American: 81 mL/min/{1.73_m2} (ref >=60)
GFR, Est Non African American: 70 mL/min/{1.73_m2} (ref >=60)
Globulin: 2.5 g/dL (calc) (ref 1.9–3.7)
Glucose, Bld: 91 mg/dL (ref 65–99)
Potassium: 4.3 mmol/L (ref 3.5–5.3)
Sodium: 143 mmol/L (ref 135–146)
Total Bilirubin: 0.7 mg/dL (ref 0.2–1.2)
Total Protein: 6.9 g/dL (ref 6.1–8.1)

## 2020-08-27 LAB — LIPID PANEL
Cholesterol: 202 mg/dL — ABNORMAL HIGH (ref <200)
HDL: 74 mg/dL (ref >=50)
LDL Cholesterol (Calc): 108 mg/dL (calc) — ABNORMAL HIGH
Non-HDL Cholesterol (Calc): 128 mg/dL (calc) (ref <130)
Total CHOL/HDL Ratio: 2.7 (calc) (ref <5.0)
Triglycerides: 108 mg/dL (ref <150)

## 2020-08-27 LAB — HEPATITIS C ANTIBODY
Hepatitis C Ab: NONREACTIVE
SIGNAL TO CUT-OFF: 0.01 (ref <1.00)

## 2020-09-09 ENCOUNTER — Other Ambulatory Visit: Payer: Self-pay | Admitting: Family Medicine

## 2020-09-09 DIAGNOSIS — J3489 Other specified disorders of nose and nasal sinuses: Secondary | ICD-10-CM

## 2020-09-10 NOTE — Telephone Encounter (Signed)
Providing 90 days per protocol. Next OV 02/25/21.

## 2020-10-14 ENCOUNTER — Telehealth: Payer: Self-pay

## 2020-10-14 NOTE — Telephone Encounter (Signed)
Copied from [place] [id number]. Topic: General - Other >> Oct 14, 2020 12:13 PM Rainey Pines A wrote: Patient wants to know if Dr .Ancil Boozer wants her to continue taking cholesterol medication until her next appt and if she needs to do any additional bloodwork. Patient also wants to know if Dr .Ancil Boozer would recommend she go the pharmacy for covid booster shot. Please advise

## 2020-10-17 NOTE — Telephone Encounter (Signed)
For the last three weeks pt has been taking it like she is suppose to. But before( when she had labs) she wasn't taking it like she was suppose to/ please advise if her medication still needs to be adjusted or if she needs to redo blood work

## 2020-10-22 DIAGNOSIS — H16143 Punctate keratitis, bilateral: Secondary | ICD-10-CM | POA: Diagnosis not present

## 2020-11-04 ENCOUNTER — Telehealth (INDEPENDENT_AMBULATORY_CARE_PROVIDER_SITE_OTHER): Payer: Medicare HMO | Admitting: Internal Medicine

## 2020-11-04 ENCOUNTER — Encounter: Payer: Self-pay | Admitting: Internal Medicine

## 2020-11-04 DIAGNOSIS — J069 Acute upper respiratory infection, unspecified: Secondary | ICD-10-CM | POA: Diagnosis not present

## 2020-11-04 DIAGNOSIS — B349 Viral infection, unspecified: Secondary | ICD-10-CM

## 2020-11-04 NOTE — Progress Notes (Signed)
Name: NAYLENE FOELL   MRN: [id number]    DOB: 1940-11-22   Date:11/04/2020       Progress Note  Subjective  Chief Complaint  Chief Complaint  Patient presents with  . Cough  . Nasal Congestion  . Fatigue  . Generalized Body Aches    From coughing.    I connected with  Idelle Reimann Rosiles on 11/04/20 at  2:40 PM EST by telephone and verified that I am speaking with the correct person using two identifiers.  I discussed the limitations, risks, security and privacy concerns of performing an evaluation and management service by telephone and the availability of in person appointments. The patient expressed understanding and agreed to proceed. Staff also discussed with the patient that there may be a patient responsible charge related to this service. Patient Location: Home Provider Location: [facility] Additional Individuals present: none  HPI Patient is an 79 year old female patient of Dr. Ancil Boozer Last visit with her was 08/26/2020 Follows up today with the above complaints.  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 [facility] and cough much less, just feels no energy + cough, mild production as had a lot of drainage on first day, better now, no PND now No marked SOB  No doc'ed fever, Sat was 100, 99.2 this am, not feeling feverish since Sat No sore throat.  + congestion No loss of smell, loss of taste No N/V + muscle aches ,a lot from coughing in her ribs and back are sore No marked loose stools/diarrhea No CP,  passing out episodes Has tried tylenol, [facility] sinus PE + allergies, flonase prior, not today yet  No tobacco history Comorbid conditions reviewed No asthma/COPD hx,  No h/o DM,  No major heart disease hx, + HTN  + overweight  All  in all, she does think she is getting better since Saturday.  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  Patient Active Problem List   Diagnosis Date Noted  . Overweight (BMI 25.0-29.9) 04/03/2020  . S/P right TKA 04/02/2020  . Status post total knee replacement, right 04/02/2020  . Pain in right knee 02/23/2020  . Senile purpura ([place]) 12/30/2018  . S/P vaginal hysterectomy 05/30/2018  . Recurrent major depressive disorder, in full remission ([place]) 03/02/2018  . GERD (gastroesophageal reflux disease) 10/20/2016  . Anxiety, generalized 01/28/2016  . Osteoarthritis of right knee 12/25/2015  . Depression 09/05/2015  . Abnormal brain CT 06/20/2015  . Anxiety, mild 06/20/2015  . Brain lesion 06/20/2015  . Clinical depression 06/20/2015  . Dyslipidemia 06/20/2015  . Hyperlipidemia LDL goal <100 06/20/2015  . Benign hypertension 06/20/2015  . Affective disorder, major 06/20/2015  . Arthralgia of shoulder 06/20/2015  . Glaucoma 06/20/2015  . Post-menopausal 06/04/2015  . Bilateral leg weakness 06/04/2015    Past Surgical History:  Procedure Laterality Date  . ANAL FISSURE REPAIR  1970s   for bleeding in 1970s  . ANTERIOR AND POSTERIOR REPAIR WITH SACROSPINOUS FIXATION N/A 05/30/2018   Procedure: ANTERIOR AND POSTERIOR REPAIR;  Surgeon: Louretta Shorten, MD;  Location: [facility];  Service: Gynecology;  Laterality: N/A;  . COLONOSCOPY  2012   [place]  . COLONOSCOPY WITH PROPOFOL N/A 09/01/2016   Procedure: COLONOSCOPY WITH PROPOFOL;  Surgeon: Forest Gleason  Bary Castilla, MD;  Location: [facility];  Service: Endoscopy;  Laterality: N/A;  . EYE SURGERY Bilateral    cataracts removed  . KNEE SURGERY Left   . SALPINGOOPHORECTOMY Right 05/30/2018   Procedure: SALPINGO OOPHORECTOMY;  Surgeon: Louretta Shorten, MD;  Location: [facility];  Service: Gynecology;  Laterality: Right;  . SUBCLAVIAN ANGIOGRAM    . TOTAL KNEE ARTHROPLASTY Right 04/02/2020   Procedure: TOTAL KNEE ARTHROPLASTY;  Surgeon:  Paralee Cancel, MD;  Location: [facility];  Service: Orthopedics;  Laterality: Right;  70 mins  . UPPER GI ENDOSCOPY  2012  . VAGINAL HYSTERECTOMY N/A 05/30/2018   Procedure: HYSTERECTOMY VAGINAL;  Surgeon: Louretta Shorten, MD;  Location: [facility];  Service: Gynecology;  Laterality: N/A;  . WISDOM TOOTH EXTRACTION      Family History  Problem Relation Age of Onset  . Cancer Mother 44       colon  . Alzheimer's disease Mother   . Hypertension Mother   . Cancer Brother        prostate  . Lymphoma Son   . Cancer Son 66       lymphoma  . Hypertension Father   . Cerebral aneurysm Father   . Diabetes Sister   . Depression Sister   . Hypertension Sister   . Depression Sister   . Cancer Brother   . Gallbladder disease Brother   . Prostate cancer Brother   . Breast cancer Neg Hx     Social History   Tobacco Use  . Smoking status: Never Smoker  . Smokeless tobacco: Never Used  . Tobacco comment: smoking cessation materials not required  Substance Use Topics  . Alcohol use: Yes    Alcohol/week: 0.0 standard drinks    Comment: occasional beer/mixed drink     Current Outpatient Medications:  .  ALPHAGAN P 0.1 % SOLN, Place 1 drop into both eyes 3 (three) times daily. , Disp: , Rfl:  .  bimatoprost (LUMIGAN) 0.01 % SOLN, Place 1 drop into both eyes at bedtime., Disp: , Rfl:  .  Biotin 10000 MCG TABS, Take 10,000 mcg by mouth daily. , Disp: , Rfl:  .  famotidine (PEPCID) 40 MG tablet, TAKE 1 TABLET (40 MG TOTAL) BY MOUTH DAILY AS NEEDED FOR HEARTBURN OR INDIGESTION., Disp: 90 tablet, Rfl: 0 .  fluticasone (FLONASE) 50 MCG/ACT nasal spray, SPRAY 2 SPRAYS INTO EACH NOSTRIL EVERY DAY, Disp: 48 mL, Rfl: 2 .  losartan (COZAAR) 25 MG tablet, Take 1 tablet (25 mg total) by mouth daily., Disp: 90 tablet, Rfl: 1 .  meclizine (ANTIVERT) 12.5 MG tablet, Take 1 tablet (12.5 mg total) by mouth 3 (three) times daily as needed for dizziness., Disp: 10 tablet, Rfl: 0 .  nystatin-triamcinolone ointment  (MYCOLOG), , Disp: , Rfl:  .  pravastatin (PRAVACHOL) 20 MG tablet, Take 1 tablet (20 mg total) by mouth daily., Disp: 90 tablet, Rfl: 1 .  PREMARIN vaginal cream, Place 1 application vaginally 2 (two) times a week., Disp: , Rfl: 12 .  White Petrolatum-Mineral Oil (SYSTANE NIGHTTIME) OINT, Place 1 application into both eyes at bedtime as needed (dry eyes)., Disp: , Rfl:   Allergies  Allergen Reactions  . Codeine     headaches  . Combigan [Brimonidine Tartrate-Timolol]     drowsy  . Sulfa Antibiotics Nausea And Vomiting  . Metronidazole Itching and Rash    With staff assistance, above reviewed with the patient today.  ROS: As per HPI, otherwise no specific complaints on a limited and  focused system review   Objective  Virtual encounter, vitals not obtained.  There is no height or weight on file to calculate BMI.  Physical Exam   Appears in NAD via conversation, very talkative and pleasant, no cough during our phone conversation Breathing: No obvious respiratory distress. Speaking in complete sentences Neurological: Pt is alert, Speech is normal Psychiatric: Patient has a normal mood and affect, behavior is normal. Judgment and thought content normal.   No results found for this or any previous visit (from the past 72 hour(s)).  PHQ2/9: Depression screen [facility] 2/9 11/04/2020 08/26/2020 03/19/2020 01/25/2020 01/01/2020  Decreased Interest 0 0 0 0 0  Down, Depressed, Hopeless 1 2 1  0 0  PHQ - 2 Score 1 2 1  0 0  Altered sleeping 0 0 0 - 0  Tired, decreased energy 2 2 0 - 2  Change in appetite 0 0 0 - 0  Feeling bad or failure about yourself  0 0 0 - 0  Trouble concentrating 0 1 1 - 0  Moving slowly or fidgety/restless 0 0 0 - 0  Suicidal thoughts 0 0 0 - 0  PHQ-9 Score 3 5 2  - 2  Difficult doing work/chores Not difficult at all Not difficult at all Not difficult at all - Not difficult at all  Some recent data might be hidden   PHQ-2/9 Result reviewed  Fall Risk: Fall Risk   11/04/2020 08/26/2020 03/19/2020 01/25/2020 01/01/2020  Falls in the past year? 0 0 0 0 0  Comment - - - - -  Number falls in past yr: 0 0 0 0 0  Injury with Fall? 0 0 0 0 0  Risk for fall due to : - - - No Fall Risks -  Risk for fall due to: Comment - - - - -  Follow up - - - Falls prevention discussed -     Assessment & Plan  1. Viral syndrome/URI  Patient with symptoms consistent with a URI/viral syndrome, and discussed the potential of it being a reaction to the booster for Covid as well.  Did note that was a possibility.  She noted she did not have any significant reactions to the first 2 doses of the vaccine.  Also discussed the possibility of it being a Covid infection.  Or possibly could be an upper respiratory infection that is viral, not Covid. Regardless of the potential source, she is feeling better, with her cough pretty well resolved, the postnasal drip pretty well resolved, yet still feeling some weakness/fatigue/diminished energy levels.  Has not lost taste or smell. 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  Would continue with symptomatic measures including the cough [facility], Tylenol products as needed, rest and good hydration, and also her Flonase product which I think can be helpful. Will be staying relatively isolated here this week. She was very much in agreement with this approach. Emphasized following up if symptoms are not improving or more problematic, and noted if symptoms get very acute including marked shortness of breath, chest pains, higher fevers, a lot more production to her cough, she needs to be seen and evaluated in a more emergent setting, and she was understanding of that.  I discussed  the assessment  and treatment plan with the patient. The patient was provided an opportunity to ask questions and all were answered. The patient agreed with the plan and demonstrated an understanding of the instructions.  Red flags and when to present for emergency care or RTC including fevers, chest pain, shortness of breath, new/worsening/un-resolving symptoms reviewed with patient at time of visit.   The patient was advised to call back or seek an in-person evaluation if the symptoms worsen or if the condition fails to improve as anticipated.  I provided 20+ minutes of non-face-to-face time during this encounter that included discussing at length patient's sx/history, pertinent pmhx, medications, treatment and follow up plan. This time also included the necessary documentation, orders, and chart review.  Towanda Malkin, MD

## 2020-12-25 ENCOUNTER — Other Ambulatory Visit: Payer: Medicare HMO

## 2020-12-25 DIAGNOSIS — Z20822 Contact with and (suspected) exposure to covid-19: Secondary | ICD-10-CM

## 2020-12-26 LAB — NOVEL CORONAVIRUS, NAA: SARS-CoV-2, NAA: NOT DETECTED

## 2020-12-26 LAB — SARS-COV-2, NAA 2 DAY TAT

## 2021-01-27 ENCOUNTER — Inpatient Hospital Stay
Admission: EM | Admit: 2021-01-27 | Discharge: 2021-01-31 | DRG: 522 | Disposition: A | Discharge status: Skilled Nursing Facility | Payer: Medicare HMO | Attending: Internal Medicine | Admitting: Internal Medicine

## 2021-01-27 ENCOUNTER — Emergency Department: Payer: Medicare HMO

## 2021-01-27 ENCOUNTER — Other Ambulatory Visit: Payer: Self-pay

## 2021-01-27 ENCOUNTER — Inpatient Hospital Stay: Payer: Medicare HMO

## 2021-01-27 DIAGNOSIS — Z833 Family history of diabetes mellitus: Secondary | ICD-10-CM

## 2021-01-27 DIAGNOSIS — K5900 Constipation, unspecified: Secondary | ICD-10-CM | POA: Diagnosis not present

## 2021-01-27 DIAGNOSIS — Z8042 Family history of malignant neoplasm of prostate: Secondary | ICD-10-CM

## 2021-01-27 DIAGNOSIS — R7301 Impaired fasting glucose: Secondary | ICD-10-CM

## 2021-01-27 DIAGNOSIS — Z8249 Family history of ischemic heart disease and other diseases of the circulatory system: Secondary | ICD-10-CM | POA: Diagnosis not present

## 2021-01-27 DIAGNOSIS — K219 Gastro-esophageal reflux disease without esophagitis: Secondary | ICD-10-CM | POA: Diagnosis not present

## 2021-01-27 DIAGNOSIS — D62 Acute posthemorrhagic anemia: Secondary | ICD-10-CM | POA: Diagnosis not present

## 2021-01-27 DIAGNOSIS — R509 Fever, unspecified: Secondary | ICD-10-CM | POA: Diagnosis not present

## 2021-01-27 DIAGNOSIS — Z419 Encounter for procedure for purposes other than remedying health state, unspecified: Secondary | ICD-10-CM

## 2021-01-27 DIAGNOSIS — M19041 Primary osteoarthritis, right hand: Secondary | ICD-10-CM | POA: Diagnosis present

## 2021-01-27 DIAGNOSIS — Z820 Family history of epilepsy and other diseases of the nervous system: Secondary | ICD-10-CM | POA: Diagnosis not present

## 2021-01-27 DIAGNOSIS — R739 Hyperglycemia, unspecified: Secondary | ICD-10-CM | POA: Diagnosis not present

## 2021-01-27 DIAGNOSIS — G8918 Other acute postprocedural pain: Secondary | ICD-10-CM

## 2021-01-27 DIAGNOSIS — Z818 Family history of other mental and behavioral disorders: Secondary | ICD-10-CM

## 2021-01-27 DIAGNOSIS — E876 Hypokalemia: Secondary | ICD-10-CM

## 2021-01-27 DIAGNOSIS — Y92019 Unspecified place in single-family (private) house as the place of occurrence of the external cause: Secondary | ICD-10-CM

## 2021-01-27 DIAGNOSIS — E7800 Pure hypercholesterolemia, unspecified: Secondary | ICD-10-CM | POA: Diagnosis present

## 2021-01-27 DIAGNOSIS — Z96651 Presence of right artificial knee joint: Secondary | ICD-10-CM | POA: Diagnosis present

## 2021-01-27 DIAGNOSIS — M1712 Unilateral primary osteoarthritis, left knee: Secondary | ICD-10-CM | POA: Diagnosis present

## 2021-01-27 DIAGNOSIS — Z888 Allergy status to other drugs, medicaments and biological substances status: Secondary | ICD-10-CM

## 2021-01-27 DIAGNOSIS — H409 Unspecified glaucoma: Secondary | ICD-10-CM | POA: Diagnosis present

## 2021-01-27 DIAGNOSIS — S72002A Fracture of unspecified part of neck of left femur, initial encounter for closed fracture: Secondary | ICD-10-CM | POA: Diagnosis not present

## 2021-01-27 DIAGNOSIS — E785 Hyperlipidemia, unspecified: Secondary | ICD-10-CM | POA: Diagnosis not present

## 2021-01-27 DIAGNOSIS — Z882 Allergy status to sulfonamides status: Secondary | ICD-10-CM | POA: Diagnosis not present

## 2021-01-27 DIAGNOSIS — Y92009 Unspecified place in unspecified non-institutional (private) residence as the place of occurrence of the external cause: Secondary | ICD-10-CM

## 2021-01-27 DIAGNOSIS — I6529 Occlusion and stenosis of unspecified carotid artery: Secondary | ICD-10-CM | POA: Diagnosis not present

## 2021-01-27 DIAGNOSIS — W010XXA Fall on same level from slipping, tripping and stumbling without subsequent striking against object, initial encounter: Secondary | ICD-10-CM | POA: Diagnosis present

## 2021-01-27 DIAGNOSIS — Z20822 Contact with and (suspected) exposure to covid-19: Secondary | ICD-10-CM | POA: Diagnosis not present

## 2021-01-27 DIAGNOSIS — G9389 Other specified disorders of brain: Secondary | ICD-10-CM | POA: Diagnosis not present

## 2021-01-27 DIAGNOSIS — Z01818 Encounter for other preprocedural examination: Secondary | ICD-10-CM | POA: Diagnosis not present

## 2021-01-27 DIAGNOSIS — I10 Essential (primary) hypertension: Secondary | ICD-10-CM | POA: Diagnosis present

## 2021-01-27 DIAGNOSIS — I959 Hypotension, unspecified: Secondary | ICD-10-CM

## 2021-01-27 DIAGNOSIS — M19042 Primary osteoarthritis, left hand: Secondary | ICD-10-CM | POA: Diagnosis not present

## 2021-01-27 DIAGNOSIS — S72002D Fracture of unspecified part of neck of left femur, subsequent encounter for closed fracture with routine healing: Secondary | ICD-10-CM | POA: Diagnosis not present

## 2021-01-27 DIAGNOSIS — M852 Hyperostosis of skull: Secondary | ICD-10-CM | POA: Diagnosis not present

## 2021-01-27 DIAGNOSIS — W19XXXA Unspecified fall, initial encounter: Secondary | ICD-10-CM

## 2021-01-27 DIAGNOSIS — S79912A Unspecified injury of left hip, initial encounter: Secondary | ICD-10-CM | POA: Diagnosis present

## 2021-01-27 DIAGNOSIS — M6281 Muscle weakness (generalized): Secondary | ICD-10-CM | POA: Diagnosis not present

## 2021-01-27 DIAGNOSIS — J3489 Other specified disorders of nose and nasal sinuses: Secondary | ICD-10-CM | POA: Diagnosis not present

## 2021-01-27 DIAGNOSIS — Z885 Allergy status to narcotic agent status: Secondary | ICD-10-CM | POA: Diagnosis not present

## 2021-01-27 DIAGNOSIS — F411 Generalized anxiety disorder: Secondary | ICD-10-CM | POA: Diagnosis not present

## 2021-01-27 DIAGNOSIS — Z79899 Other long term (current) drug therapy: Secondary | ICD-10-CM | POA: Diagnosis not present

## 2021-01-27 DIAGNOSIS — S72042A Displaced fracture of base of neck of left femur, initial encounter for closed fracture: Secondary | ICD-10-CM | POA: Diagnosis not present

## 2021-01-27 DIAGNOSIS — R279 Unspecified lack of coordination: Secondary | ICD-10-CM | POA: Diagnosis not present

## 2021-01-27 DIAGNOSIS — Z807 Family history of other malignant neoplasms of lymphoid, hematopoietic and related tissues: Secondary | ICD-10-CM

## 2021-01-27 DIAGNOSIS — R5381 Other malaise: Secondary | ICD-10-CM | POA: Diagnosis not present

## 2021-01-27 LAB — CBC
HCT: 40.5 % (ref 36.0–46.0)
Hemoglobin: 13.3 g/dL (ref 12.0–15.0)
MCH: 28.6 pg (ref 26.0–34.0)
MCHC: 32.8 g/dL (ref 30.0–36.0)
MCV: 87.1 fL (ref 80.0–100.0)
Platelets: 196 10*3/uL (ref 150–400)
RBC: 4.65 MIL/uL (ref 3.87–5.11)
RDW: 13.5 % (ref 11.5–15.5)
WBC: 5.4 10*3/uL (ref 4.0–10.5)
nRBC: 0 % (ref 0.0–0.2)

## 2021-01-27 LAB — SAMPLE TO BLOOD BANK

## 2021-01-27 LAB — BASIC METABOLIC PANEL WITH GFR
Anion gap: 7 (ref 5–15)
BUN: 14 mg/dL (ref 8–23)
CO2: 26 mmol/L (ref 22–32)
Calcium: 9.1 mg/dL (ref 8.9–10.3)
Chloride: 107 mmol/L (ref 98–111)
Creatinine, Ser: 0.69 mg/dL (ref 0.44–1.00)
GFR, Estimated: >60 mL/min
Glucose, Bld: 102 mg/dL — ABNORMAL HIGH (ref 70–99)
Potassium: 3.5 mmol/L (ref 3.5–5.1)
Sodium: 140 mmol/L (ref 135–145)

## 2021-01-27 LAB — RESP PANEL BY RT-PCR (FLU A&B, COVID) ARPGX2
Influenza A by PCR: NEGATIVE
Influenza B by PCR: NEGATIVE
SARS Coronavirus 2 by RT PCR: NEGATIVE

## 2021-01-27 MED ORDER — PRAVASTATIN SODIUM 20 MG PO TABS
20.0000 mg | ORAL_TABLET | Freq: Every day | ORAL | Status: DC
  Administered 2021-01-29 – 2021-01-31 (×3): 20 mg via ORAL
  Filled 2021-01-27 (×3): qty 1

## 2021-01-27 MED ORDER — ACETAMINOPHEN 325 MG PO TABS
650.0000 mg | ORAL_TABLET | Freq: Four times a day (QID) | ORAL | Status: DC | PRN
  Administered 2021-01-30: 650 mg via ORAL
  Filled 2021-01-27: qty 2

## 2021-01-27 MED ORDER — FENTANYL CITRATE (PF) 100 MCG/2ML IJ SOLN: 50.0000 ug | Freq: Once | INTRAMUSCULAR | Status: AC

## 2021-01-27 MED ORDER — BIOTIN 10000 MCG PO TABS: 10000.0000 ug | ORAL_TABLET | Freq: Every day | ORAL | Status: DC

## 2021-01-27 MED ORDER — OXYCODONE-ACETAMINOPHEN 5-325 MG PO TABS
1.0000 | ORAL_TABLET | ORAL | Status: DC | PRN
  Administered 2021-01-27 – 2021-01-31 (×17): 1 via ORAL
  Filled 2021-01-27 (×17): qty 1

## 2021-01-27 MED ORDER — SENNOSIDES-DOCUSATE SODIUM 8.6-50 MG PO TABS
1.0000 | ORAL_TABLET | Freq: Every evening | ORAL | Status: DC | PRN
  Administered 2021-01-29: 1 via ORAL
  Filled 2021-01-27: qty 1

## 2021-01-27 MED ORDER — LATANOPROST 0.005 % OP SOLN
1.0000 [drp] | Freq: Every day | OPHTHALMIC | Status: DC
  Administered 2021-01-27 – 2021-01-30 (×4): 1 [drp] via OPHTHALMIC
  Filled 2021-01-27 (×2): qty 2.5

## 2021-01-27 MED ORDER — FENTANYL CITRATE (PF) 100 MCG/2ML IJ SOLN
50.0000 ug | INTRAMUSCULAR | Status: DC | PRN
  Administered 2021-01-28 – 2021-01-29 (×6): 50 ug via INTRAVENOUS
  Filled 2021-01-27 (×5): qty 2

## 2021-01-27 MED ORDER — SODIUM CHLORIDE 0.9 % IV SOLN: INTRAVENOUS | Status: DC

## 2021-01-27 MED ORDER — PROCHLORPERAZINE EDISYLATE 10 MG/2ML IJ SOLN
10.0000 mg | Freq: Once | INTRAMUSCULAR | Status: AC
  Administered 2021-01-27: 10 mg via INTRAVENOUS
  Filled 2021-01-27: qty 2

## 2021-01-27 MED ORDER — LOSARTAN POTASSIUM 25 MG PO TABS
25.0000 mg | ORAL_TABLET | Freq: Every day | ORAL | Status: DC
  Administered 2021-01-28 – 2021-01-29 (×2): 25 mg via ORAL
  Filled 2021-01-27 (×2): qty 1

## 2021-01-27 MED ORDER — BRIMONIDINE TARTRATE 0.15 % OP SOLN
1.0000 [drp] | Freq: Three times a day (TID) | OPHTHALMIC | Status: DC
  Administered 2021-01-28 – 2021-01-31 (×8): 1 [drp] via OPHTHALMIC
  Filled 2021-01-27 (×3): qty 5

## 2021-01-27 MED ORDER — MORPHINE SULFATE (PF) 2 MG/ML IV SOLN
2.0000 mg | INTRAVENOUS | Status: DC | PRN
  Administered 2021-01-27 (×2): 2 mg via INTRAVENOUS
  Filled 2021-01-27 (×2): qty 1

## 2021-01-27 MED ORDER — HEPARIN SODIUM (PORCINE) 5000 UNIT/ML IJ SOLN
5000.0000 [IU] | Freq: Once | INTRAMUSCULAR | Status: AC
  Administered 2021-01-27: 5000 [IU] via SUBCUTANEOUS
  Filled 2021-01-27: qty 1

## 2021-01-27 MED ORDER — ONDANSETRON HCL 4 MG/2ML IJ SOLN
4.0000 mg | Freq: Three times a day (TID) | INTRAMUSCULAR | Status: DC | PRN
  Administered 2021-01-27 – 2021-01-28 (×3): 4 mg via INTRAVENOUS
  Filled 2021-01-27 (×2): qty 2

## 2021-01-27 MED ORDER — FLUTICASONE PROPIONATE 50 MCG/ACT NA SUSP
2.0000 | Freq: Every day | NASAL | Status: DC
  Filled 2021-01-27: qty 16

## 2021-01-27 MED ORDER — MELATONIN 5 MG PO TABS
5.0000 mg | ORAL_TABLET | Freq: Every day | ORAL | Status: DC
  Administered 2021-01-27 – 2021-01-30 (×4): 5 mg via ORAL
  Filled 2021-01-27 (×4): qty 1

## 2021-01-27 MED ORDER — HYDRALAZINE HCL 20 MG/ML IJ SOLN: 5.0000 mg | INTRAMUSCULAR | Status: DC | PRN

## 2021-01-27 MED ORDER — METHOCARBAMOL 500 MG PO TABS
500.0000 mg | ORAL_TABLET | Freq: Three times a day (TID) | ORAL | Status: DC | PRN
  Administered 2021-01-27 (×2): 500 mg via ORAL
  Filled 2021-01-27 (×4): qty 1

## 2021-01-27 MED ORDER — FENTANYL CITRATE (PF) 100 MCG/2ML IJ SOLN
INTRAMUSCULAR | Status: AC
  Administered 2021-01-27: 50 ug via INTRAVENOUS
  Filled 2021-01-27: qty 2

## 2021-01-27 MED ORDER — ONDANSETRON HCL 4 MG/2ML IJ SOLN: 4.0000 mg | Freq: Once | INTRAMUSCULAR | Status: DC

## 2021-01-27 MED ORDER — FENTANYL CITRATE (PF) 100 MCG/2ML IJ SOLN
50.0000 ug | Freq: Once | INTRAMUSCULAR | Status: AC
Start: 2021-01-27 — End: 2021-01-27
  Administered 2021-01-27: 50 ug via INTRAVENOUS
  Filled 2021-01-27: qty 2

## 2021-01-27 MED ORDER — FAMOTIDINE 20 MG PO TABS
20.0000 mg | ORAL_TABLET | Freq: Every day | ORAL | Status: DC | PRN
  Administered 2021-01-31: 20 mg via ORAL
  Filled 2021-01-27: qty 1

## 2021-01-27 MED ORDER — CEFAZOLIN SODIUM-DEXTROSE 1-4 GM/50ML-% IV SOLN
1.0000 g | Freq: Once | INTRAVENOUS | Status: AC
  Administered 2021-01-28: 1 g via INTRAVENOUS
  Filled 2021-01-27: qty 50

## 2021-01-27 NOTE — ED Notes (Signed)
Pt's external cath had come out of place and pt had urinated in bed. Pt cleaned up and repositioned at this time.

## 2021-01-27 NOTE — ED Notes (Signed)
Admit MD at bedside

## 2021-01-27 NOTE — H&P (Signed)
History and Physical    ETNA FORQUER HEN:[id number] DOB: Sep 06, 1941 DOA: 01/27/2021  Referring MD/NP/PA:   PCP: Steele Sizer, MD   Patient coming from:  The patient is coming from home.  At baseline, pt is independent for most of ADL.        Chief Complaint: Fall and left hip pain  HPI: [name] is a 80 y.o. female with medical history significant of hypertension, hyperlipidemia, GERD, depression, anxiety, vertigo, anemia, who presents with fall and left hip pain.  Pt states that she fell accidentally when she walked down the stairs and tripped her steps in early AM today.  No loss of consciousness.  No head or neck injury.  She developed pain in left hip, which is severe, constant, sharp, nonradiating.  No leg numbness.  Denies chest pain, shortness breath, cough.  No fever or chills.  No nausea, vomiting, diarrhea, abdominal pain, symptoms of UTI.  ED Course: pt was found to have WBC 5.4, negative Covid PCR, electrolytes renal function okay, temperature normal, blood pressure 97/84, 132/79, heart rate 45, 81, RR 18, oxygen saturation 97% on room air.  Chest x-ray negative.  X-ray showed impacted left femoral neck fracture.  CT head is negative for acute intracranial abnormalities.  Dr. Rudene Christians of Ortho is consulted.  CT-head: 1. No acute intracranial abnormality. 2. Decreased size and conspicuity of the hyperdense focus in the right cerebellar hemisphere with some adjacent parenchymal volume loss, favor sequela of prior hemorrhage. 3. Pneumatized secretion in the left sphenoid sinus. Correlate for acute sinusitis.  Review of Systems:   General: no fevers, chills, no body weight gain, has fatigue HEENT: no blurry vision, hearing changes or sore throat Respiratory: no dyspnea, coughing, wheezing CV: no chest pain, no palpitations GI: no nausea, vomiting, abdominal pain, diarrhea, constipation GU: no dysuria, burning on urination, increased urinary frequency,  hematuria  Ext: no leg edema Neuro: no unilateral weakness, numbness, or tingling, no vision change or hearing loss. Has fall Skin: no rash, no skin tear. MSK: has left hip pain Heme: No easy bruising.  Travel history: No recent long distant travel.  Allergy:  Allergies  Allergen Reactions  . Codeine     headaches  . Combigan [Brimonidine Tartrate-Timolol]     drowsy  . Sulfa Antibiotics Nausea And Vomiting  . Metronidazole Itching and Rash    Past Medical History:  Diagnosis Date  . Anemia   . Anxiety    no meds  . Arthritis    hands, knees - no meds  . Bilateral leg weakness   . Complication of anesthesia    Difficult to arouse  . Dental bridge present    perm lower front bottom bridge  . Depression    no meds  . Eyes swollen    and red - was seen by MD for possible allergy to new eye drops  . GERD (gastroesophageal reflux disease)    diet controlled - only takes med prn basis  . Glaucoma   . Hypercholesteremia   . Hypertension   . SVD (spontaneous vaginal delivery)    x 3 - only one currently living  . Vertigo     Past Surgical History:  Procedure Laterality Date  . ANAL FISSURE REPAIR  1970s   for bleeding in 1970s  . ANTERIOR AND POSTERIOR REPAIR WITH SACROSPINOUS FIXATION N/A 05/30/2018   Procedure: ANTERIOR AND POSTERIOR REPAIR;  Surgeon: Louretta Shorten, MD;  Location: [facility];  Service: Gynecology;  Laterality: N/A;  .  COLONOSCOPY  2012   [facility]  . COLONOSCOPY WITH PROPOFOL N/A 09/01/2016   Procedure: COLONOSCOPY WITH PROPOFOL;  Surgeon: Robert Bellow, MD;  Location: [facility];  Service: Endoscopy;  Laterality: N/A;  . EYE SURGERY Bilateral    cataracts removed  . KNEE SURGERY Left   . SALPINGOOPHORECTOMY Right 05/30/2018   Procedure: SALPINGO OOPHORECTOMY;  Surgeon: Louretta Shorten, MD;  Location: [facility];  Service: Gynecology;  Laterality: Right;  . SUBCLAVIAN ANGIOGRAM    . TOTAL KNEE ARTHROPLASTY Right 04/02/2020   Procedure: TOTAL KNEE  ARTHROPLASTY;  Surgeon: Paralee Cancel, MD;  Location: [facility];  Service: Orthopedics;  Laterality: Right;  70 mins  . UPPER GI ENDOSCOPY  2012  . VAGINAL HYSTERECTOMY N/A 05/30/2018   Procedure: HYSTERECTOMY VAGINAL;  Surgeon: Louretta Shorten, MD;  Location: [facility];  Service: Gynecology;  Laterality: N/A;  . WISDOM TOOTH EXTRACTION      Social History:  reports that she has never smoked. She has never used smokeless tobacco. She reports current alcohol use. She reports that she does not use drugs.  Family History:  Family History  Problem Relation Age of Onset  . Cancer Mother 60       colon  . Alzheimer's disease Mother   . Hypertension Mother   . Cancer Brother        prostate  . Lymphoma Son   . Cancer Son 14       lymphoma  . Hypertension Father   . Cerebral aneurysm Father   . Diabetes Sister   . Depression Sister   . Hypertension Sister   . Depression Sister   . Cancer Brother   . Gallbladder disease Brother   . Prostate cancer Brother   . Breast cancer Neg Hx      Prior to Admission medications   Medication Sig Start Date End Date Taking? Authorizing Provider  ALPHAGAN P 0.1 % SOLN Place 1 drop into both eyes 3 (three) times daily.  02/16/17  Yes [provider]  losartan (COZAAR) 25 MG tablet Take 1 tablet (25 mg total) by mouth daily. 08/26/20  Yes Sowles, Drue Stager, MD  pravastatin (PRAVACHOL) 20 MG tablet Take 1 tablet (20 mg total) by mouth daily. 08/26/20  Yes Sowles, Drue Stager, MD  bimatoprost (LUMIGAN) 0.01 % SOLN Place 1 drop into both eyes at bedtime.    [provider]  Biotin 10000 MCG TABS Take 10,000 mcg by mouth daily.     [provider]  famotidine (PEPCID) 40 MG tablet TAKE 1 TABLET (40 MG TOTAL) BY MOUTH DAILY AS NEEDED FOR HEARTBURN OR INDIGESTION. 10/15/19   Steele Sizer, MD  fluticasone (FLONASE) 50 MCG/ACT nasal spray SPRAY 2 SPRAYS INTO EACH NOSTRIL EVERY DAY 09/10/20   Steele Sizer, MD  meclizine (ANTIVERT) 12.5 MG tablet  Take 1 tablet (12.5 mg total) by mouth 3 (three) times daily as needed for dizziness. 06/30/19   Steele Sizer, MD  nystatin-triamcinolone ointment Lilyan Gilford)  02/29/20   [provider]  PREMARIN vaginal cream Place 1 application vaginally 2 (two) times a week. 03/23/18   [provider]  White Petrolatum-Mineral Oil (SYSTANE NIGHTTIME) OINT Place 1 application into both eyes at bedtime as needed (dry eyes).    [provider]    Physical Exam: Vitals:   01/27/21 1100 01/27/21 1210 01/27/21 1330 01/27/21 1400  BP: (!) 160/77 (!) 149/89 (!) 142/65 135/63  Pulse: 78 80 85 81  Resp: 16 16 18    Temp:  TempSrc:      SpO2: 98% 96% 100% 97%  Weight:      Height:       General: Not in acute distress HEENT:       Eyes: PERRL, EOMI, no scleral icterus.       ENT: No discharge from the ears and nose, no pharynx injection, no tonsillar enlargement.        Neck: No JVD, no bruit, no mass felt. Heme: No neck lymph node enlargement. Cardiac: S1/S2, RRR, No murmurs, No gallops or rubs. Respiratory: No rales, wheezing, rhonchi or rubs. GI: Soft, nondistended, nontender, no rebound pain, no organomegaly, BS present. GU: No hematuria Ext: No pitting leg edema bilaterally. 1+DP/PT pulse bilaterally. Musculoskeletal: has tenderness left hip.  Left leg is slightly shortened Skin: No rashes.  Neuro: Alert, oriented X3, cranial nerves II-XII grossly intact, moves all extremities  Psych: Patient is not psychotic, no suicidal or hemocidal ideation.  Labs on Admission: I have personally reviewed following labs and imaging studies  CBC: Recent Labs  Lab 01/27/21 0746  WBC 5.4  HGB 13.3  HCT 40.5  MCV 87.1  PLT 063   Basic Metabolic Panel: Recent Labs  Lab 01/27/21 0746  NA 140  K 3.5  CL 107  CO2 26  GLUCOSE 102*  BUN 14  CREATININE 0.69  CALCIUM 9.1   GFR: Estimated Creatinine Clearance: 50.2 mL/min (by C-G formula based on SCr of 0.69 mg/dL). Liver  Function Tests: No results for input(s): AST, ALT, ALKPHOS, BILITOT, PROT, ALBUMIN in the last 168 hours. No results for input(s): LIPASE, AMYLASE in the last 168 hours. No results for input(s): AMMONIA in the last 168 hours. Coagulation Profile: No results for input(s): INR, PROTIME in the last 168 hours. Cardiac Enzymes: No results for input(s): CKTOTAL, CKMB, CKMBINDEX, TROPONINI in the last 168 hours. BNP (last 3 results) No results for input(s): PROBNP in the last 8760 hours. HbA1C: No results for input(s): HGBA1C in the last 72 hours. CBG: No results for input(s): GLUCAP in the last 168 hours. Lipid Profile: No results for input(s): CHOL, HDL, LDLCALC, TRIG, CHOLHDL, LDLDIRECT in the last 72 hours. Thyroid Function Tests: No results for input(s): TSH, T4TOTAL, FREET4, T3FREE, THYROIDAB in the last 72 hours. Anemia Panel: No results for input(s): VITAMINB12, FOLATE, FERRITIN, TIBC, IRON, RETICCTPCT in the last 72 hours. Urine analysis:    Component Value Date/Time   COLORURINE YELLOW 03/05/2017 1419   APPEARANCEUR CLEAR 03/05/2017 1419   LABSPEC 1.023 03/05/2017 1419   PHURINE 5.5 03/05/2017 1419   GLUCOSEU NEGATIVE 03/05/2017 1419   HGBUR NEGATIVE 03/05/2017 1419   BILIRUBINUR NEGATIVE 03/05/2017 1419   KETONESUR NEGATIVE 03/05/2017 1419   PROTEINUR NEGATIVE 03/05/2017 1419   NITRITE NEGATIVE 03/05/2017 1419   LEUKOCYTESUR NEGATIVE 03/05/2017 1419   Sepsis Labs: @LABRCNTIP (procalcitonin:4,lacticidven:4) ) Recent Results (from the past 240 hour(s))  Resp Panel by RT-PCR (Flu A&B, Covid) Nasopharyngeal Swab     Status: None   Collection Time: 01/27/21  7:46 AM   Specimen: Nasopharyngeal Swab; Nasopharyngeal(NP) swabs in vial transport medium  Result Value Ref Range Status   SARS Coronavirus 2 by RT PCR NEGATIVE NEGATIVE Final    Comment: (NOTE) SARS-CoV-2 target nucleic acids are NOT DETECTED.  The SARS-CoV-2 RNA is generally detectable in upper  respiratory specimens during the acute phase of infection. The lowest concentration of SARS-CoV-2 viral copies this assay can detect is 138 copies/mL. A negative result does not preclude SARS-Cov-2 infection and should not be used  as the sole basis for treatment or other patient management decisions. A negative result may occur with  improper specimen collection/handling, submission of specimen other than nasopharyngeal swab, presence of viral mutation(s) within the areas targeted by this assay, and inadequate number of viral copies(<138 copies/mL). A negative result must be combined with clinical observations, patient history, and epidemiological information. The expected result is Negative.  Fact Sheet for Patients:  EntrepreneurPulse.com.au  Fact Sheet for Healthcare Providers:  IncredibleEmployment.be  This test is no t yet approved or cleared by the [place] FDA and  has been authorized for detection and/or diagnosis of SARS-CoV-2 by FDA under an Emergency Use Authorization (EUA). This EUA will remain  in effect (meaning this test can be used) for the duration of the COVID-19 declaration under Section 564(b)(1) of the Act, 21 U.S.C.section 360bbb-3(b)(1), unless the authorization is terminated  or revoked sooner.       Influenza A by PCR NEGATIVE NEGATIVE Final   Influenza B by PCR NEGATIVE NEGATIVE Final    Comment: (NOTE) The Xpert Xpress SARS-CoV-2/FLU/RSV plus assay is intended as an aid in the diagnosis of influenza from Nasopharyngeal swab specimens and should not be used as a sole basis for treatment. Nasal washings and aspirates are unacceptable for Xpert Xpress SARS-CoV-2/FLU/RSV testing.  Fact Sheet for Patients: EntrepreneurPulse.com.au  Fact Sheet for Healthcare Providers: IncredibleEmployment.be  This test is not yet approved or cleared by the [place] FDA and has been  authorized for detection and/or diagnosis of SARS-CoV-2 by FDA under an Emergency Use Authorization (EUA). This EUA will remain in effect (meaning this test can be used) for the duration of the COVID-19 declaration under Section 564(b)(1) of the Act, 21 U.S.C. section 360bbb-3(b)(1), unless the authorization is terminated or revoked.  Performed at [facility], [address] [place], [place] [address]      Radiological Exams on Admission: DG Chest 1 View  Result Date: 01/27/2021 CLINICAL DATA:  Hip fracture preop EXAM: CHEST  1 VIEW COMPARISON:  08/16/2017 FINDINGS: The heart size and mediastinal contours are within normal limits. Both lungs are clear. The visualized skeletal structures are unremarkable. IMPRESSION: No active disease. Electronically Signed   By: Franchot Gallo M.D.   On: 01/27/2021 08:47   CT HEAD WO CONTRAST  Result Date: 01/27/2021 CLINICAL DATA:  Fall today with left hip fracture, denies headache or head injury EXAM: CT HEAD WITHOUT CONTRAST TECHNIQUE: Contiguous axial images were obtained from the base of the skull through the vertex without intravenous contrast. COMPARISON:  Head CT August 23, 2014. FINDINGS: Brain: No evidence of acute large vascular territory infarction, hemorrhage, hydrocephalus, extra-axial collection or mass lesion/mass effect. Decreased size and conspicuity of the hyperdense focus in the the the along the medial aspect of the right cerebellar hemisphere which now measures 2-3 mm on image 10/2 and 49/4 previously 7 mm some corresponding parenchymal volume loss, favor sequela of prior hemorrhage. Vascular: No hyperdense vessel. Atherosclerotic calcifications of the carotid siphon. Skull: Benign hyperostosis frontalis. Negative for fracture or focal lesion. Sinuses/Orbits: Pneumatized secretion in the left sphenoid sinus otherwise the paranasal and maxillary sinuses are predominantly clear. Other: None IMPRESSION: 1. No acute intracranial  abnormality. 2. Decreased size and conspicuity of the hyperdense focus in the right cerebellar hemisphere with some adjacent parenchymal volume loss, favor sequela of prior hemorrhage. 3. Pneumatized secretion in the left sphenoid sinus. Correlate for acute sinusitis. Electronically Signed   By: Dahlia Bailiff MD   On: 01/27/2021 11:48  DG HIP UNILAT WITH PELVIS 2-3 VIEWS LEFT  Result Date: 01/27/2021 CLINICAL DATA:  Fall EXAM: DG HIP (WITH OR WITHOUT PELVIS) 2-3V LEFT COMPARISON:  None. FINDINGS: Fracture left femoral neck with impaction and mild angulation. Left hip joint normal. No other pelvic fracture. Right hip appears normal. IMPRESSION: Impacted fracture left femoral neck. Electronically Signed   By: Franchot Gallo M.D.   On: 01/27/2021 08:46     EKG: I have personally reviewed.  Sinus rhythm, QTC 446, low voltage, LAE, nonspecific T wave change  Assessment/Plan Principal Problem:   Closed left hip fracture (HCC) Active Problems:   Dyslipidemia   GERD (gastroesophageal reflux disease)   Fall at home, initial encounter   Closed left hip fracture [facility]): As evidenced by x-ray. Patient has moderate pain now. No neurovascular compromise. Orthopedic surgeon, Dr. Rudene Christians was consulted.   - will admit to Med-surg bed - Pain control: morphine prn and percocet - When necessary Zofran for nausea - Robaxin for muscle spasm - type and cross - INR/PTT -PT/OT when able to (not ordered now)  Dyslipidemia -Pravastatin  GERD (gastroesophageal reflux disease) -Pepcid prn  Fall at home, initial encounter: CT head negative for acute intracranial abnormalities. -PT/OT when able to      Perioperative Cardiac Risk: pt has multiple history of hyperlipidemia, hypertension, vertigo, but no history of CAD or CHF.  No COPD. Currently patient is active and independent of ADLs and, IADLs. Patient does not have chest pain, shortness of breath, palpitation, leg edema.  At this time point, no further  work up is needed. Patient's GUPTA score perioperative myocardial infarction or cardaic arrest is 0.21 %.  I discussed the risk with patient, pt would like to proceed for surgery.   DVT ppx: SCD ( will give one dose sq heparin now). Code Status: Full code Family Communication:  Yes, patient's  Son by phone Disposition Plan:  Anticipate discharge back to previous environment Consults called:  Dr. Rudene Christians fo ortho Admission status and Level of care: Med-Surg:    as inpt    Status is: Inpatient  Remains inpatient appropriate because:Inpatient level of care appropriate due to severity of illness   Dispo: The patient is from: Home              Anticipated d/c is to: likely rehab              Patient currently is not medically stable to d/c.   Difficult to place patient No          Date of Service 01/27/2021    [facility]   If 7PM-7AM, please contact night-coverage [email or web address] 01/27/2021, 2:33 PM

## 2021-01-27 NOTE — ED Notes (Signed)
Patient did not get lunch tray, requested from dietary.

## 2021-01-27 NOTE — ED Triage Notes (Addendum)
Pt comes via EMS from home with c/o mechanical fall. Pt states she was walking down the steps and tripped and fell. Pt denies any LOC or blood thinners.   Pt states left hip pain that is 10/10. Pt has some abrasions to right hand.  Pt has some shortening noted to left leg.

## 2021-01-27 NOTE — ED Provider Notes (Signed)
[facility] Emergency Department Provider Note  Time seen: 7:49 AM  I have reviewed the triage vital signs and the nursing notes.   HISTORY  Chief Complaint Fall   HPI [name] is a 80 y.o. female with a past medical history of anemia, anxiety, depression, gastric reflux, hypertension, hyperlipidemia, presents to the emergency department after a fall with left hip pain.  According to the patient she was going down the stairs when she slipped on the last step landing on her left side.  Patient states immediate pain to the left hip unable to stand or walk secondary to pain.  Did not hit her head.  No LOC.  No anticoagulation.   Past Medical History:  Diagnosis Date  . Anemia   . Anxiety    no meds  . Arthritis    hands, knees - no meds  . Bilateral leg weakness   . Complication of anesthesia    Difficult to arouse  . Dental bridge present    perm lower front bottom bridge  . Depression    no meds  . Eyes swollen    and red - was seen by MD for possible allergy to new eye drops  . GERD (gastroesophageal reflux disease)    diet controlled - only takes med prn basis  . Glaucoma   . Hypercholesteremia   . Hypertension   . SVD (spontaneous vaginal delivery)    x 3 - only one currently living  . Vertigo     Patient Active Problem List   Diagnosis Date Noted  . Overweight (BMI 25.0-29.9) 04/03/2020  . S/P right TKA 04/02/2020  . Status post total knee replacement, right 04/02/2020  . Pain in right knee 02/23/2020  . Senile purpura ([place]) 12/30/2018  . S/P vaginal hysterectomy 05/30/2018  . Recurrent major depressive disorder, in full remission ([place]) 03/02/2018  . GERD (gastroesophageal reflux disease) 10/20/2016  . Anxiety, generalized 01/28/2016  . Osteoarthritis of right knee 12/25/2015  . Depression 09/05/2015  . Abnormal brain CT 06/20/2015  . Anxiety, mild 06/20/2015  . Brain lesion 06/20/2015  . Clinical depression 06/20/2015  .  Dyslipidemia 06/20/2015  . Hyperlipidemia LDL goal <100 06/20/2015  . Benign hypertension 06/20/2015  . Affective disorder, major 06/20/2015  . Arthralgia of shoulder 06/20/2015  . Glaucoma 06/20/2015  . Post-menopausal 06/04/2015  . Bilateral leg weakness 06/04/2015    Past Surgical History:  Procedure Laterality Date  . ANAL FISSURE REPAIR  1970s   for bleeding in 1970s  . ANTERIOR AND POSTERIOR REPAIR WITH SACROSPINOUS FIXATION N/A 05/30/2018   Procedure: ANTERIOR AND POSTERIOR REPAIR;  Surgeon: Louretta Shorten, MD;  Location: [facility];  Service: Gynecology;  Laterality: N/A;  . COLONOSCOPY  2012   [place]  . COLONOSCOPY WITH PROPOFOL N/A 09/01/2016   Procedure: COLONOSCOPY WITH PROPOFOL;  Surgeon: Robert Bellow, MD;  Location: [facility] ENDOSCOPY;  Service: Endoscopy;  Laterality: N/A;  . EYE SURGERY Bilateral    cataracts removed  . KNEE SURGERY Left   . SALPINGOOPHORECTOMY Right 05/30/2018   Procedure: SALPINGO OOPHORECTOMY;  Surgeon: Louretta Shorten, MD;  Location: [facility];  Service: Gynecology;  Laterality: Right;  . SUBCLAVIAN ANGIOGRAM    . TOTAL KNEE ARTHROPLASTY Right 04/02/2020   Procedure: TOTAL KNEE ARTHROPLASTY;  Surgeon: Paralee Cancel, MD;  Location: [facility];  Service: Orthopedics;  Laterality: Right;  70 mins  . UPPER GI ENDOSCOPY  2012  . VAGINAL HYSTERECTOMY N/A 05/30/2018   Procedure: HYSTERECTOMY VAGINAL;  Surgeon:  Louretta Shorten, MD;  Location: [facility];  Service: Gynecology;  Laterality: N/A;  . WISDOM TOOTH EXTRACTION      Prior to Admission medications   Medication Sig Start Date End Date Taking? Authorizing Provider  ALPHAGAN P 0.1 % SOLN Place 1 drop into both eyes 3 (three) times daily.  02/16/17   [provider]  bimatoprost (LUMIGAN) 0.01 % SOLN Place 1 drop into both eyes at bedtime.    [provider]  Biotin 10000 MCG TABS Take 10,000 mcg by mouth daily.     [provider]  famotidine (PEPCID) 40 MG tablet TAKE 1 TABLET (40 MG TOTAL) BY  MOUTH DAILY AS NEEDED FOR HEARTBURN OR INDIGESTION. 10/15/19   Steele Sizer, MD  fluticasone (FLONASE) 50 MCG/ACT nasal spray SPRAY 2 SPRAYS INTO EACH NOSTRIL EVERY DAY 09/10/20   Steele Sizer, MD  losartan (COZAAR) 25 MG tablet Take 1 tablet (25 mg total) by mouth daily. 08/26/20   Steele Sizer, MD  meclizine (ANTIVERT) 12.5 MG tablet Take 1 tablet (12.5 mg total) by mouth 3 (three) times daily as needed for dizziness. 06/30/19   Steele Sizer, MD  nystatin-triamcinolone ointment Lilyan Gilford)  02/29/20   [provider]  pravastatin (PRAVACHOL) 20 MG tablet Take 1 tablet (20 mg total) by mouth daily. 08/26/20   Steele Sizer, MD  PREMARIN vaginal cream Place 1 application vaginally 2 (two) times a week. 03/23/18   [provider]  White Petrolatum-Mineral Oil (SYSTANE NIGHTTIME) OINT Place 1 application into both eyes at bedtime as needed (dry eyes).    [provider]    Allergies  Allergen Reactions  . Codeine     headaches  . Combigan [Brimonidine Tartrate-Timolol]     drowsy  . Sulfa Antibiotics Nausea And Vomiting  . Metronidazole Itching and Rash    Family History  Problem Relation Age of Onset  . Cancer Mother 59       colon  . Alzheimer's disease Mother   . Hypertension Mother   . Cancer Brother        prostate  . Lymphoma Son   . Cancer Son 87       lymphoma  . Hypertension Father   . Cerebral aneurysm Father   . Diabetes Sister   . Depression Sister   . Hypertension Sister   . Depression Sister   . Cancer Brother   . Gallbladder disease Brother   . Prostate cancer Brother   . Breast cancer Neg Hx     Social History Social History   Tobacco Use  . Smoking status: Never Smoker  . Smokeless tobacco: Never Used  . Tobacco comment: smoking cessation materials not required  Vaping Use  . Vaping Use: Never used  Substance Use Topics  . Alcohol use: Yes    Alcohol/week: 0.0 standard drinks    Comment: occasional beer/mixed  drink  . Drug use: No    Review of Systems Constitutional: Negative for fever Cardiovascular: Negative for chest pain. Respiratory: Negative for shortness of breath. Gastrointestinal: Negative for abdominal pain Musculoskeletal: Left hip pain. Neurological: Negative for headache All other ROS negative  ____________________________________________   PHYSICAL EXAM:  VITAL SIGNS: ED Triage Vitals  Enc Vitals Group     BP --      Pulse --      Resp --      Temp --      Temp src --      SpO2 --  Weight 01/27/21 0747 143 lb (64.9 kg)     Height 01/27/21 0747 5' 2.5" (1.588 m)     Head Circumference --      Peak Flow --      Pain Score 01/27/21 0743 10     Pain Loc --      Pain Edu? --      Excl. in [place]? --     Constitutional: Alert and oriented. Well appearing and in no distress. Eyes: Normal exam ENT      Head: Normocephalic and atraumatic.      Mouth/Throat: Mucous membranes are moist. Cardiovascular: Normal rate, regular rhythm.  Respiratory: Normal respiratory effort without tachypnea nor retractions. Breath sounds are clear Gastrointestinal: Soft and nontender. No distention.   Musculoskeletal: Mild tenderness to palpation of left hip with significant pain with attempted range of motion.  Neurovascular intact distally. Neurologic:  Normal speech and language. No gross focal neurologic deficits  Skin:  Skin is warm.  Small abrasion right hand. Psychiatric: Mood and affect are normal.   ____________________________________________    EKG  EKG viewed and interpreted by myself shows a normal sinus rhythm 82 bpm with a narrow QRS, normal axis, normal intervals, no concerning ST changes.  ____________________________________________    RADIOLOGY  X-ray shows an impacted left femoral neck fracture Chest x-ray is negative ____________________________________________   INITIAL IMPRESSION / ASSESSMENT AND PLAN / ED COURSE  Pertinent labs & imaging results  that were available during my care of the patient were reviewed by me and considered in my medical decision making (see chart for details).   Patient presents emergency department after a mechanical fall while going down the steps landing on her left side.  Patient has a somewhat shortened and externally rotated left lower extremity with mild tenderness of the left hip and moderate pain with range of motion.  Neurovascularly intact.  Suspect likely hip fracture.  We will check labs EKG and chest x-ray for preop.  Obtain hip x-rays treat pain.  Patient agreeable to plan of care.  X-ray confirms left femoral neck fracture.  Patient drank a cup of coffee at 6:20 AM has not eaten anything today.  No anticoagulation.  I spoke to Dr. Rudene Christians who will see the patient possibly operate today.  We will admit to the hospitalist service.  [name] was evaluated in Emergency Department on 01/27/2021 for the symptoms described in the history of present illness. She was evaluated in the context of the global COVID-19 pandemic, which necessitated consideration that the patient might be at risk for infection with the SARS-CoV-2 virus that causes COVID-19. Institutional protocols and algorithms that pertain to the evaluation of patients at risk for COVID-19 are in a state of rapid change based on information released by regulatory bodies including the CDC and federal and state organizations. These policies and algorithms were followed during the patient's care in the ED.  ____________________________________________   FINAL CLINICAL IMPRESSION(S) / ED DIAGNOSES  Fall Left hip fracture   Harvest Dark, MD 01/27/21 [phone]

## 2021-01-27 NOTE — ED Notes (Signed)
Xray coming to get patient at this time

## 2021-01-27 NOTE — Progress Notes (Signed)
Pt arrived to the floor from Er. Patient admitted with left hip fracture. Pt alert and oriented x 3. She is experiencing some pain. Admission to be completed. Pain meds to be given.

## 2021-01-27 NOTE — ED Notes (Signed)
Pt resting quietly in room, family at the bedside. Call bell in reach. Will continue to assess.

## 2021-01-27 NOTE — ED Notes (Signed)
Patients pastor now at bedside

## 2021-01-27 NOTE — ED Notes (Signed)
Patient reports mild nausea, requested ginger ale and crackers, provided to patient.

## 2021-01-27 NOTE — Consult Note (Signed)
Reason for Consult: Left hip fracture Referring Physician: Dr. Roselyn Reef [name] is an 80 y.o. female.  HPI: Patient is an 80 year old active female who was getting ready to go to work stepdown step and missed her last step.  She fell onto her left side injuring her left hip.  She is brought to the emergency room and found to have a displaced femoral neck fracture.  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  She had a right total knee last year and is recovered from that.  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  She drives herself and lives alone.  Past Medical History:  Diagnosis Date  . Anemia   . Anxiety    no meds  . Arthritis    hands, knees - no meds  . Bilateral leg weakness   . Complication of anesthesia    Difficult to arouse  . Dental bridge present    perm lower front bottom bridge  . Depression    no meds  . Eyes swollen    and red - was seen by MD for possible allergy to new eye drops  . GERD (gastroesophageal reflux disease)    diet controlled - only takes med prn basis  . Glaucoma   . Hypercholesteremia   . Hypertension   . SVD (spontaneous vaginal delivery)    x 3 - only one currently living  . Vertigo     Past Surgical History:  Procedure Laterality Date  . ANAL FISSURE REPAIR  1970s   for bleeding in 1970s  . ANTERIOR AND POSTERIOR REPAIR WITH SACROSPINOUS FIXATION N/A 05/30/2018   Procedure: ANTERIOR AND POSTERIOR REPAIR;  Surgeon: Louretta Shorten, MD;  Location: [facility];  Service: Gynecology;  Laterality: N/A;  . COLONOSCOPY  2012   [place]  . COLONOSCOPY WITH PROPOFOL N/A 09/01/2016   Procedure: COLONOSCOPY WITH PROPOFOL;  Surgeon: Robert Bellow, MD;  Location: [facility] ENDOSCOPY;  Service: Endoscopy;  Laterality: N/A;  . EYE SURGERY Bilateral    cataracts removed  . KNEE SURGERY Left   . SALPINGOOPHORECTOMY Right 05/30/2018   Procedure: SALPINGO OOPHORECTOMY;  Surgeon: Louretta Shorten,  MD;  Location: [facility];  Service: Gynecology;  Laterality: Right;  . SUBCLAVIAN ANGIOGRAM    . TOTAL KNEE ARTHROPLASTY Right 04/02/2020   Procedure: TOTAL KNEE ARTHROPLASTY;  Surgeon: Paralee Cancel, MD;  Location: [facility];  Service: Orthopedics;  Laterality: Right;  70 mins  . UPPER GI ENDOSCOPY  2012  . VAGINAL HYSTERECTOMY N/A 05/30/2018   Procedure: HYSTERECTOMY VAGINAL;  Surgeon: Louretta Shorten, MD;  Location: [facility];  Service: Gynecology;  Laterality: N/A;  . WISDOM TOOTH EXTRACTION      Family History  Problem Relation Age of Onset  . Cancer Mother 27       colon  . Alzheimer's disease Mother   . Hypertension Mother   . Cancer Brother        prostate  . Lymphoma Son   . Cancer Son 79       lymphoma  . Hypertension Father   . Cerebral aneurysm Father   . Diabetes Sister   . Depression Sister   . Hypertension Sister   . Depression Sister   . Cancer Brother   . Gallbladder disease Brother   . Prostate cancer Brother   . Breast cancer Neg Hx     Social History:  reports that she has never smoked. She has never used  smokeless tobacco. She reports current alcohol use. She reports that she does not use drugs.  Allergies:  Allergies  Allergen Reactions  . Codeine     headaches  . Combigan [Brimonidine Tartrate-Timolol]     drowsy  . Sulfa Antibiotics Nausea And Vomiting  . Metronidazole Itching and Rash    Medications: I have reviewed the patient's current medications.  Results for orders placed or performed during the hospital encounter of 01/27/21 (from the past 48 hour(s))  CBC     Status: None   Collection Time: 01/27/21  7:46 AM  Result Value Ref Range   WBC 5.4 4.0 - 10.5 K/uL   RBC 4.65 3.87 - 5.11 MIL/uL   Hemoglobin 13.3 12.0 - 15.0 g/dL   HCT 40.5 36.0 - 46.0 %   MCV 87.1 80.0 - 100.0 fL   MCH 28.6 26.0 - 34.0 pg   MCHC 32.8 30.0 - 36.0 g/dL   RDW 13.5 11.5 - 15.5 %   Platelets 196 150 - 400 K/uL   nRBC 0.0 0.0 - 0.2 %    Comment: Performed at [facility], [address], [place], [place] [address]  Basic metabolic panel     Status: Abnormal   Collection Time: 01/27/21  7:46 AM  Result Value Ref Range   Sodium 140 135 - 145 mmol/L   Potassium 3.5 3.5 - 5.1 mmol/L   Chloride 107 98 - 111 mmol/L   CO2 26 22 - 32 mmol/L   Glucose, Bld 102 (H) 70 - 99 mg/dL    Comment: Glucose reference range applies only to samples taken after fasting for at least 8 hours.   BUN 14 8 - 23 mg/dL   Creatinine, Ser 0.69 0.44 - 1.00 mg/dL   Calcium 9.1 8.9 - 10.3 mg/dL   GFR, Estimated >60 >60 mL/min    Comment: (NOTE) Calculated using the CKD-EPI Creatinine Equation (2021)    Anion gap 7 5 - 15    Comment: Performed at [facility], [address] [place], [place] [address]  Resp Panel by RT-PCR (Flu A&B, Covid) Nasopharyngeal Swab     Status: None   Collection Time: 01/27/21  7:46 AM   Specimen: Nasopharyngeal Swab; Nasopharyngeal(NP) swabs in vial transport medium  Result Value Ref Range   SARS Coronavirus 2 by RT PCR NEGATIVE NEGATIVE    Comment: (NOTE) SARS-CoV-2 target nucleic acids are NOT DETECTED.  The SARS-CoV-2 RNA is generally detectable in upper respiratory specimens during the acute phase of infection. The lowest concentration of SARS-CoV-2 viral copies this assay can detect is 138 copies/mL. A negative result does not preclude SARS-Cov-2 infection and should not be used as the sole basis for treatment or other patient management decisions. A negative result may occur with  improper specimen collection/handling, submission of specimen other than nasopharyngeal swab, presence of viral mutation(s) within the areas targeted by this assay, and inadequate number of viral copies(<138 copies/mL). A negative result must be combined with clinical observations, patient history, and epidemiological information. The expected result is Negative.  Fact Sheet for Patients:  EntrepreneurPulse.com.au  Fact  Sheet for Healthcare Providers:  IncredibleEmployment.be  This test is no t yet approved or cleared by the [place] FDA and  has been authorized for detection and/or diagnosis of SARS-CoV-2 by FDA under an Emergency Use Authorization (EUA). This EUA will remain  in effect (meaning this test can be used) for the duration of the COVID-19 declaration under Section 564(b)(1) of the Act,  21 U.S.C.section 360bbb-3(b)(1), unless the authorization is terminated  or revoked sooner.       Influenza A by PCR NEGATIVE NEGATIVE   Influenza B by PCR NEGATIVE NEGATIVE    Comment: (NOTE) The Xpert Xpress SARS-CoV-2/FLU/RSV plus assay is intended as an aid in the diagnosis of influenza from Nasopharyngeal swab specimens and should not be used as a sole basis for treatment. Nasal washings and aspirates are unacceptable for Xpert Xpress SARS-CoV-2/FLU/RSV testing.  Fact Sheet for Patients: EntrepreneurPulse.com.au  Fact Sheet for Healthcare Providers: IncredibleEmployment.be  This test is not yet approved or cleared by the [place] FDA and has been authorized for detection and/or diagnosis of SARS-CoV-2 by FDA under an Emergency Use Authorization (EUA). This EUA will remain in effect (meaning this test can be used) for the duration of the COVID-19 declaration under Section 564(b)(1) of the Act, 21 U.S.C. section 360bbb-3(b)(1), unless the authorization is terminated or revoked.  Performed at [facility], [address] [place], [place] [address]   Sample to Blood Bank     Status: None   Collection Time: 01/27/21  7:46 AM  Result Value Ref Range   Blood Bank Specimen SAMPLE AVAILABLE FOR TESTING    Sample Expiration      01/30/2021,2359 Performed at [facility], [address] [place], [place] [address]     DG Chest 1 View  Result Date: 01/27/2021 CLINICAL DATA:  Hip fracture preop EXAM: CHEST  1  VIEW COMPARISON:  08/16/2017 FINDINGS: The heart size and mediastinal contours are within normal limits. Both lungs are clear. The visualized skeletal structures are unremarkable. IMPRESSION: No active disease. Electronically Signed   By: Franchot Gallo M.D.   On: 01/27/2021 08:47   CT HEAD WO CONTRAST  Result Date: 01/27/2021 CLINICAL DATA:  Fall today with left hip fracture, denies headache or head injury EXAM: CT HEAD WITHOUT CONTRAST TECHNIQUE: Contiguous axial images were obtained from the base of the skull through the vertex without intravenous contrast. COMPARISON:  Head CT August 23, 2014. FINDINGS: Brain: No evidence of acute large vascular territory infarction, hemorrhage, hydrocephalus, extra-axial collection or mass lesion/mass effect. Decreased size and conspicuity of the hyperdense focus in the the the along the medial aspect of the right cerebellar hemisphere which now measures 2-3 mm on image 10/2 and 49/4 previously 7 mm some corresponding parenchymal volume loss, favor sequela of prior hemorrhage. Vascular: No hyperdense vessel. Atherosclerotic calcifications of the carotid siphon. Skull: Benign hyperostosis frontalis. Negative for fracture or focal lesion. Sinuses/Orbits: Pneumatized secretion in the left sphenoid sinus otherwise the paranasal and maxillary sinuses are predominantly clear. Other: None IMPRESSION: 1. No acute intracranial abnormality. 2. Decreased size and conspicuity of the hyperdense focus in the right cerebellar hemisphere with some adjacent parenchymal volume loss, favor sequela of prior hemorrhage. 3. Pneumatized secretion in the left sphenoid sinus. Correlate for acute sinusitis. Electronically Signed   By: Dahlia Bailiff MD   On: 01/27/2021 11:48   DG HIP UNILAT WITH PELVIS 2-3 VIEWS LEFT  Result Date: 01/27/2021 CLINICAL DATA:  Fall EXAM: DG HIP (WITH OR WITHOUT PELVIS) 2-3V LEFT COMPARISON:  None. FINDINGS: Fracture left femoral neck with impaction and mild  angulation. Left hip joint normal. No other pelvic fracture. Right hip appears normal. IMPRESSION: Impacted fracture left femoral neck. Electronically Signed   By: Franchot Gallo M.D.   On: 01/27/2021 08:46    Review of Systems Blood pressure (!) 160/77, pulse 78, temperature 98.4 F (36.9 C), temperature source  Oral, resp. rate 16, height 5' 2.5" (1.588 m), weight 64.9 kg, SpO2 98 %. Physical Exam Her left leg is flexed externally rotated and shortened with skin intact around the left hip.  She has palpable pulses in anteflexed and the toes.  Sensation intact. Assessment/Plan: Displaced left femoral neck fracture an active 80 year old. Recommendation is for total hip with mild arthritis present and her activity level I suspect may arthroplasty lead to significant groin pain with her activities including working standing all day.  Hessie Knows 01/27/2021, 12:26 PM

## 2021-01-27 NOTE — ED Notes (Signed)
Patient requesting additional pain medication, MD notified.

## 2021-01-27 NOTE — ED Notes (Signed)
Patient given ice cream.

## 2021-01-27 NOTE — ED Notes (Signed)
Patient to and from CT.

## 2021-01-28 ENCOUNTER — Encounter: Payer: Self-pay | Admitting: Internal Medicine

## 2021-01-28 ENCOUNTER — Inpatient Hospital Stay: Payer: Medicare HMO | Admitting: Anesthesiology

## 2021-01-28 ENCOUNTER — Inpatient Hospital Stay: Payer: Medicare HMO

## 2021-01-28 ENCOUNTER — Ambulatory Visit: Payer: Medicare HMO

## 2021-01-28 ENCOUNTER — Encounter
Admission: EM | Disposition: A | Discharge status: Skilled Nursing Facility | Payer: Self-pay | Source: Home / Self Care | Attending: Internal Medicine

## 2021-01-28 DIAGNOSIS — R739 Hyperglycemia, unspecified: Secondary | ICD-10-CM

## 2021-01-28 HISTORY — PX: TOTAL HIP ARTHROPLASTY: SHX124

## 2021-01-28 LAB — CBC
HCT: 39.9 % (ref 36.0–46.0)
Hemoglobin: 13.3 g/dL (ref 12.0–15.0)
MCH: 28.6 pg (ref 26.0–34.0)
MCHC: 33.3 g/dL (ref 30.0–36.0)
MCV: 85.8 fL (ref 80.0–100.0)
Platelets: 210 10*3/uL (ref 150–400)
RBC: 4.65 MIL/uL (ref 3.87–5.11)
RDW: 13.6 % (ref 11.5–15.5)
WBC: 9.5 10*3/uL (ref 4.0–10.5)
nRBC: 0 % (ref 0.0–0.2)

## 2021-01-28 LAB — BASIC METABOLIC PANEL
Anion gap: 9 (ref 5–15)
BUN: 14 mg/dL (ref 8–23)
CO2: 26 mmol/L (ref 22–32)
Calcium: 9.1 mg/dL (ref 8.9–10.3)
Chloride: 102 mmol/L (ref 98–111)
Creatinine, Ser: 0.67 mg/dL (ref 0.44–1.00)
GFR, Estimated: >60 mL/min (ref >60)
Glucose, Bld: 116 mg/dL — ABNORMAL HIGH (ref 70–99)
Potassium: 3.9 mmol/L (ref 3.5–5.1)
Sodium: 137 mmol/L (ref 135–145)

## 2021-01-28 LAB — PROTIME-INR
INR: 1 (ref 0.8–1.2)
Prothrombin Time: 13.2 seconds (ref 11.4–15.2)

## 2021-01-28 LAB — APTT: aPTT: 32 seconds (ref 24–36)

## 2021-01-28 LAB — TYPE AND SCREEN
ABO/RH(D): A POS
Antibody Screen: NEGATIVE

## 2021-01-28 SURGERY — ARTHROPLASTY, HIP, TOTAL, ANTERIOR APPROACH
Anesthesia: Spinal | Site: Hip | Laterality: Left

## 2021-01-28 MED ORDER — BISACODYL 10 MG RE SUPP: 10.0000 mg | Freq: Every day | RECTAL | Status: DC | PRN

## 2021-01-28 MED ORDER — PROPOFOL 10 MG/ML IV BOLUS
INTRAVENOUS | Status: DC | PRN
  Administered 2021-01-28: 40 mg via INTRAVENOUS
  Administered 2021-01-28: 30 mg via INTRAVENOUS

## 2021-01-28 MED ORDER — METOCLOPRAMIDE HCL 10 MG PO TABS
5.0000 mg | ORAL_TABLET | Freq: Three times a day (TID) | ORAL | Status: DC | PRN
Start: 2021-01-28 — End: 2021-01-30

## 2021-01-28 MED ORDER — ONDANSETRON HCL 4 MG/2ML IJ SOLN: 4.0000 mg | Freq: Four times a day (QID) | INTRAMUSCULAR | Status: DC | PRN

## 2021-01-28 MED ORDER — ENOXAPARIN SODIUM 40 MG/0.4ML ~~LOC~~ SOLN
40.0000 mg | SUBCUTANEOUS | Status: DC
  Administered 2021-01-29 – 2021-01-31 (×3): 40 mg via SUBCUTANEOUS
  Filled 2021-01-28 (×3): qty 0.4

## 2021-01-28 MED ORDER — FENTANYL CITRATE (PF) 100 MCG/2ML IJ SOLN
INTRAMUSCULAR | Status: AC
  Filled 2021-01-28: qty 2

## 2021-01-28 MED ORDER — SODIUM CHLORIDE 0.9 % IV SOLN
INTRAVENOUS | Status: DC | PRN
  Administered 2021-01-28: 60 mL

## 2021-01-28 MED ORDER — ONDANSETRON HCL 4 MG/2ML IJ SOLN
INTRAMUSCULAR | Status: AC
  Filled 2021-01-28: qty 2

## 2021-01-28 MED ORDER — BUPIVACAINE HCL (PF) 0.5 % IJ SOLN
INTRAMUSCULAR | Status: DC | PRN
  Administered 2021-01-28: 3 mL

## 2021-01-28 MED ORDER — FENTANYL CITRATE (PF) 100 MCG/2ML IJ SOLN: 25.0000 ug | INTRAMUSCULAR | Status: DC | PRN

## 2021-01-28 MED ORDER — PROPOFOL 500 MG/50ML IV EMUL
INTRAVENOUS | Status: DC | PRN
  Administered 2021-01-28: 125 ug/kg/min via INTRAVENOUS

## 2021-01-28 MED ORDER — PHENYLEPHRINE HCL (PRESSORS) 10 MG/ML IV SOLN
INTRAVENOUS | Status: DC | PRN
  Administered 2021-01-28 (×2): 100 ug via INTRAVENOUS

## 2021-01-28 MED ORDER — MAGNESIUM HYDROXIDE 400 MG/5ML PO SUSP
30.0000 mL | Freq: Every day | ORAL | Status: DC | PRN
  Administered 2021-01-30: 30 mL via ORAL
  Filled 2021-01-28: qty 30

## 2021-01-28 MED ORDER — SODIUM CHLORIDE 0.9 % IV SOLN
INTRAVENOUS | Status: DC | PRN
  Administered 2021-01-28: 30 ug/min via INTRAVENOUS

## 2021-01-28 MED ORDER — PANTOPRAZOLE SODIUM 40 MG PO TBEC
40.0000 mg | DELAYED_RELEASE_TABLET | Freq: Every day | ORAL | Status: DC
  Administered 2021-01-28 – 2021-01-31 (×4): 40 mg via ORAL
  Filled 2021-01-28 (×4): qty 1

## 2021-01-28 MED ORDER — METOCLOPRAMIDE HCL 5 MG/ML IJ SOLN: 5.0000 mg | Freq: Three times a day (TID) | INTRAMUSCULAR | Status: DC | PRN

## 2021-01-28 MED ORDER — ZOLPIDEM TARTRATE 5 MG PO TABS
5.0000 mg | ORAL_TABLET | Freq: Every evening | ORAL | Status: DC | PRN
  Administered 2021-01-28 – 2021-01-29 (×2): 5 mg via ORAL
  Filled 2021-01-28 (×2): qty 1

## 2021-01-28 MED ORDER — PROPOFOL 10 MG/ML IV BOLUS
INTRAVENOUS | Status: AC
  Filled 2021-01-28: qty 60

## 2021-01-28 MED ORDER — EPHEDRINE SULFATE 50 MG/ML IJ SOLN
INTRAMUSCULAR | Status: DC | PRN
  Administered 2021-01-28: 10 mg via INTRAVENOUS

## 2021-01-28 MED ORDER — ONDANSETRON HCL 4 MG/2ML IJ SOLN: 4.0000 mg | Freq: Once | INTRAMUSCULAR | Status: DC | PRN

## 2021-01-28 MED ORDER — METHOCARBAMOL 1000 MG/10ML IJ SOLN
500.0000 mg | Freq: Four times a day (QID) | INTRAVENOUS | Status: DC | PRN
  Filled 2021-01-28: qty 5

## 2021-01-28 MED ORDER — PHENOL 1.4 % MT LIQD
1.0000 | OROMUCOSAL | Status: DC | PRN
  Filled 2021-01-28: qty 177

## 2021-01-28 MED ORDER — NEOMYCIN-POLYMYXIN B GU 40-200000 IR SOLN
Status: DC | PRN
  Administered 2021-01-28: 4 mL

## 2021-01-28 MED ORDER — SODIUM CHLORIDE 0.9 % IV SOLN: INTRAVENOUS | Status: DC

## 2021-01-28 MED ORDER — CEFAZOLIN SODIUM-DEXTROSE 1-4 GM/50ML-% IV SOLN
INTRAVENOUS | Status: AC
  Administered 2021-01-28: 1 g via INTRAVENOUS
  Filled 2021-01-28: qty 50

## 2021-01-28 MED ORDER — PROPOFOL 10 MG/ML IV BOLUS
INTRAVENOUS | Status: AC
  Filled 2021-01-28: qty 20

## 2021-01-28 MED ORDER — ONDANSETRON HCL 4 MG PO TABS
4.0000 mg | ORAL_TABLET | Freq: Four times a day (QID) | ORAL | Status: DC | PRN
  Administered 2021-01-31: 4 mg via ORAL
  Filled 2021-01-28: qty 1

## 2021-01-28 MED ORDER — DOCUSATE SODIUM 100 MG PO CAPS
100.0000 mg | ORAL_CAPSULE | Freq: Two times a day (BID) | ORAL | Status: DC
  Administered 2021-01-28 – 2021-01-31 (×6): 100 mg via ORAL
  Filled 2021-01-28 (×6): qty 1

## 2021-01-28 MED ORDER — MAGNESIUM CITRATE PO SOLN
1.0000 | Freq: Once | ORAL | Status: DC | PRN
  Filled 2021-01-28: qty 296

## 2021-01-28 MED ORDER — DIPHENHYDRAMINE HCL 12.5 MG/5ML PO ELIX
12.5000 mg | ORAL_SOLUTION | ORAL | Status: DC | PRN
Start: 2021-01-28 — End: 2021-01-31
  Filled 2021-01-28: qty 10

## 2021-01-28 MED ORDER — CEFAZOLIN SODIUM-DEXTROSE 1-4 GM/50ML-% IV SOLN
1.0000 g | Freq: Four times a day (QID) | INTRAVENOUS | Status: AC
  Administered 2021-01-28: 1 g via INTRAVENOUS
  Filled 2021-01-28 (×3): qty 50

## 2021-01-28 MED ORDER — MENTHOL 3 MG MT LOZG
1.0000 | LOZENGE | OROMUCOSAL | Status: DC | PRN
  Filled 2021-01-28: qty 9

## 2021-01-28 MED ORDER — LACTATED RINGERS IV SOLN: INTRAVENOUS | Status: DC | PRN

## 2021-01-28 MED ORDER — ALUM & MAG HYDROXIDE-SIMETH 200-200-20 MG/5ML PO SUSP: 30.0000 mL | ORAL | Status: DC | PRN

## 2021-01-28 MED ORDER — METHOCARBAMOL 500 MG PO TABS
500.0000 mg | ORAL_TABLET | Freq: Four times a day (QID) | ORAL | Status: DC | PRN
  Administered 2021-01-28 – 2021-01-31 (×8): 500 mg via ORAL
  Filled 2021-01-28 (×8): qty 1

## 2021-01-28 MED ORDER — BUPIVACAINE-EPINEPHRINE 0.25% -1:200000 IJ SOLN
INTRAMUSCULAR | Status: DC | PRN
  Administered 2021-01-28: 30 mL

## 2021-01-28 SURGICAL SUPPLY — 64 items
BLADE SAGITTAL AGGR TOOTH XLG (BLADE) ×2 IMPLANT
BNDG COHESIVE 6X5 TAN STRL LF (GAUZE/BANDAGES/DRESSINGS) ×6 IMPLANT
CANISTER SUCT 1200ML W/VALVE (MISCELLANEOUS) ×2 IMPLANT
CANISTER WOUND CARE 500ML ATS (WOUND CARE) ×2 IMPLANT
CEMENT BONE 40GM (Cement) ×4 IMPLANT
CEMENT RESTRICTOR DEPUY SZ 3 (Cement) ×2 IMPLANT
CHLORAPREP W/TINT 26 (MISCELLANEOUS) ×2 IMPLANT
COVER BACK TABLE REUSABLE LG (DRAPES) ×2 IMPLANT
COVER WAND RF STERILE (DRAPES) ×2 IMPLANT
DRAPE 3/4 80X56 (DRAPES) ×6 IMPLANT
DRAPE C-ARM XRAY 36X54 (DRAPES) ×2 IMPLANT
DRAPE INCISE IOBAN 66X60 STRL (DRAPES) IMPLANT
DRAPE POUCH INSTRU U-SHP 10X18 (DRAPES) ×2 IMPLANT
DRESSING SURGICEL FIBRLLR 1X2 (HEMOSTASIS) ×2 IMPLANT
DRSG MEPILEX SACRM 8.7X9.8 (GAUZE/BANDAGES/DRESSINGS) ×2 IMPLANT
DRSG OPSITE POSTOP 4X8 (GAUZE/BANDAGES/DRESSINGS) ×4 IMPLANT
DRSG SURGICEL FIBRILLAR 1X2 (HEMOSTASIS) ×4
ELECT BLADE 6.5 EXT (BLADE) ×2 IMPLANT
ELECT REM PT RETURN 9FT ADLT (ELECTROSURGICAL) ×2
ELECTRODE REM PT RTRN 9FT ADLT (ELECTROSURGICAL) ×1 IMPLANT
GLOVE SURG SYN 9.0  PF PI (GLOVE) ×2
GLOVE SURG SYN 9.0 PF PI (GLOVE) ×2 IMPLANT
GLOVE SURG UNDER POLY LF SZ9 (GLOVE) ×2 IMPLANT
GOWN SRG 2XL LVL 4 RGLN SLV (GOWNS) ×1 IMPLANT
GOWN STRL NON-REIN 2XL LVL4 (GOWNS) ×1
GOWN STRL REUS W/ TWL LRG LVL3 (GOWN DISPOSABLE) ×1 IMPLANT
GOWN STRL REUS W/TWL LRG LVL3 (GOWN DISPOSABLE) ×1
HEMOVAC 400CC 10FR (MISCELLANEOUS) IMPLANT
HIP FEM HD S 28 (Head) ×2 IMPLANT
HIP STEM FEM 2 STD (Stem) ×2 IMPLANT
HOLDER FOLEY CATH W/STRAP (MISCELLANEOUS) ×2 IMPLANT
HOOD PEEL AWAY FLYTE STAYCOOL (MISCELLANEOUS) ×2 IMPLANT
IRRIGATION SURGIPHOR STRL (IV SOLUTION) IMPLANT
KIT PREVENA INCISION MGT 13 (CANNISTER) ×2 IMPLANT
KNIFE SCULPS 14X20 (INSTRUMENTS) ×4 IMPLANT
LINER DUAL MOB 50MM (Liner) ×2 IMPLANT
MANIFOLD NEPTUNE II (INSTRUMENTS) ×2 IMPLANT
MAT ABSORB  FLUID 56X50 GRAY (MISCELLANEOUS) ×1
MAT ABSORB FLUID 56X50 GRAY (MISCELLANEOUS) ×1 IMPLANT
NDL SAFETY ECLIPSE 18X1.5 (NEEDLE) ×1 IMPLANT
NEEDLE HYPO 18GX1.5 SHARP (NEEDLE) ×1
NEEDLE SPNL 20GX3.5 QUINCKE YW (NEEDLE) ×4 IMPLANT
NS IRRIG 1000ML POUR BTL (IV SOLUTION) ×2 IMPLANT
PACK HIP COMPR (MISCELLANEOUS) ×2 IMPLANT
SCALPEL PROTECTED #10 DISP (BLADE) ×4 IMPLANT
SHELL ACETABULAR SZ0 50 DME (Shell) ×2 IMPLANT
SOL PREP PVP 2OZ (MISCELLANEOUS) ×2
SOLUTION PREP PVP 2OZ (MISCELLANEOUS) ×1 IMPLANT
SPONGE DRAIN TRACH 4X4 STRL 2S (GAUZE/BANDAGES/DRESSINGS) ×2 IMPLANT
STAPLER SKIN PROX 35W (STAPLE) ×2 IMPLANT
STRAP SAFETY 5IN WIDE (MISCELLANEOUS) ×2 IMPLANT
SUT DVC 2 QUILL PDO  T11 36X36 (SUTURE) ×1
SUT DVC 2 QUILL PDO T11 36X36 (SUTURE) ×1 IMPLANT
SUT SILK 0 (SUTURE) ×1
SUT SILK 0 30XBRD TIE 6 (SUTURE) ×1 IMPLANT
SUT V-LOC 90 ABS DVC 3-0 CL (SUTURE) ×2 IMPLANT
SUT VIC AB 1 CT1 36 (SUTURE) ×2 IMPLANT
SYR 20ML LL LF (SYRINGE) ×2 IMPLANT
SYR 30ML LL (SYRINGE) ×2 IMPLANT
SYR 50ML LL SCALE MARK (SYRINGE) ×4 IMPLANT
SYR BULB IRRIG 60ML STRL (SYRINGE) ×2 IMPLANT
TAPE MICROFOAM 4IN (TAPE) ×2 IMPLANT
TOWEL OR 17X26 4PK STRL BLUE (TOWEL DISPOSABLE) ×2 IMPLANT
TRAY FOLEY MTR SLVR 16FR STAT (SET/KITS/TRAYS/PACK) ×2 IMPLANT

## 2021-01-28 NOTE — Plan of Care (Signed)
  Problem: Pain Managment: Goal: General experience of comfort will improve Outcome: Progressing   Problem: Safety: Goal: Ability to remain free from injury will improve Outcome: Progressing   

## 2021-01-28 NOTE — Transfer of Care (Signed)
Immediate Anesthesia Transfer of Care Note  Patient: [name]  Procedure(s) Performed: TOTAL HIP ARTHROPLASTY ANTERIOR APPROACH (Left Hip)  Patient Location: PACU  Anesthesia Type:Spinal  Level of Consciousness: awake, alert  and oriented  Airway & Oxygen Therapy: Patient Spontanous Breathing and Patient connected to face mask oxygen  Post-op Assessment: Report given to RN and Post -op Vital signs reviewed and stable  Post vital signs: Reviewed and stable  Last Vitals:  Vitals Value Taken Time  BP 95/52 01/28/21 1435  Temp 36.6 C 01/28/21 1427  Pulse 80 01/28/21 1437  Resp 18 01/28/21 1437  SpO2 100 % 01/28/21 1437  Vitals shown include unvalidated device data.  Last Pain:  Vitals:   01/28/21 1150  TempSrc: Temporal  PainSc: 6       Patients Stated Pain Goal: 0 (82/70/78 6754)  Complications: No complications documented.

## 2021-01-28 NOTE — Op Note (Signed)
01/27/2021 - 01/28/2021  2:34 PM  PATIENT:  [name]  80 y.o. female  PRE-OPERATIVE DIAGNOSIS:  Left Hip Fracture displaced femoral neck with osteoarthritis  POST-OPERATIVE DIAGNOSIS:  Left Hip Fracture same  PROCEDURE:  Procedure(s): TOTAL HIP ARTHROPLASTY ANTERIOR APPROACH (Left)  SURGEON: Laurene Footman, MD  ASSISTANTS: None  ANESTHESIA:   spinal  EBL:  Total I/O In: 1300 [I.V.:1300] Out: 700 [Urine:600; Blood:100]  BLOOD ADMINISTERED:none  DRAINS: Incisional wound VAC   LOCAL MEDICATIONS USED:  MARCAINE    and OTHER Exparel  SPECIMEN:  Source of Specimen:  Left femoral head and neck  DISPOSITION OF SPECIMEN:  PATHOLOGY  COUNTS:  YES  TOURNIQUET:  * No tourniquets in log *  IMPLANTS: Medacta cemented AMIS 2 stem with metal S head, 50 mm Mpact TM cup and liner  DICTATION: .Dragon Dictation   The patient was brought to the operating room and after spinal anesthesia was obtained patient was placed on the operative table with the ipsilateral foot into the Medacta attachment, contralateral leg on a well-padded table. C-arm was brought in and preop template x-ray taken. After prepping and draping in usual sterile fashion appropriate patient identification and timeout procedures were completed. Anterior approach to the hip was obtained and centered over the greater trochanter and TFL muscle. The subcutaneous tissue was incised hemostasis being achieved by electrocautery. TFL fascia was incised and the muscle retracted laterally deep retractor placed. The lateral femoral circumflex vessels were identified and ligated. The anterior capsule was exposed and a capsulotomy performed. The neck was identified and a femoral neck cut carried out with a saw. The head was removed without difficulty and showed sclerotic femoral head and acetabulum. Reaming was carried out to 50 mm and a 50 mm cup trial gave appropriate tightness to the acetabular component a 50 DM cup was impacted into  position. The leg was then externally rotated and ischiofemoral and pubofemoral releases carried out. The femur was sequentially broached to a size 3 and size 3 cement restrictor placed.  Next the canal was prepared and dried with the #2 cemented stem placed and held in position until cement had set. size S head trials were placed and the final components chosen. The metal S 50 28 mm head and 50 mm liner. The hip was reduced and was stable the wound was thoroughly irrigated with fibrillar placed along the posterior capsule and medial neck. The deep fascia ws closed using a heavy Quill after infiltration of 30 cc of quarter percent Sensorcaine with epinephrine diluted with Exparel throughout the case .3-0 V-loc to close the skin with skin staples.  Incisional wound VAC applied and patient was sent to recovery in stable condition.   PLAN OF CARE: Admit to inpatient

## 2021-01-28 NOTE — Progress Notes (Signed)
Initial Nutrition Assessment  DOCUMENTATION CODES:   Not applicable  INTERVENTION:   -Once diet is advanced, add:   -Ensure Enlive po BID, each supplement provides 350 kcal and 20 grams of protein -MVI with minerals daily  NUTRITION DIAGNOSIS:   Increased nutrient needs related to post-op healing as evidenced by estimated needs.  GOAL:   Patient will meet greater than or equal to 90% of their needs  MONITOR:   PO intake,Supplement acceptance,Diet advancement,Labs,Weight trends,Skin,I & O's  REASON FOR ASSESSMENT:   Consult Assessment of nutrition requirement/status,Hip fracture protocol  ASSESSMENT:   [name] is a 80 y.o. female with medical history significant of hypertension, hyperlipidemia, GERD, depression, anxiety, vertigo, anemia, who presents with fall and left hip pain.  Pt admitted with closed lt hip fracture.   Reviewed I/O's: -250 ml x 24 hours  Per orthopedics notes, pt NPO for total hip replacement today.   Spoke with pt and and son at bedside. Pt reports she has a great appetite and usually cosnumes 3-4 meals per day. Pt shares that she works part time in nutritional services for the school system and her eating schedule is often erratic due to meal time hours.   Pt denies any weight loss. Reviewed wt hx; wt has been stable over the past year.  Discussed importance of good meal and supplement intake to promote healing.   Medications reviewed and include melatonin.   Labs reviewed.   NUTRITION - FOCUSED PHYSICAL EXAM:  Flowsheet Row Most Recent Value  Orbital Region No depletion  Upper Arm Region No depletion  Thoracic and Lumbar Region No depletion  Buccal Region No depletion  Temple Region No depletion  Clavicle Bone Region No depletion  Clavicle and Acromion Bone Region No depletion  Scapular Bone Region No depletion  Dorsal Hand No depletion  Patellar Region No depletion  Anterior Thigh Region No depletion  Posterior Calf  Region No depletion  Edema (RD Assessment) None  Hair Reviewed  Eyes Reviewed  Mouth Reviewed  Skin Reviewed  Nails Reviewed       Diet Order:   Diet Order            Diet NPO time specified Except for: Ice Chips, Sips with Meds  Diet effective midnight                 EDUCATION NEEDS:   Education needs have been addressed  Skin:  Skin Assessment: Reviewed RN Assessment  Last BM:  Unknown  Height:   Ht Readings from Last 1 Encounters:  01/28/21 5' 2.5" (1.588 m)    Weight:   Wt Readings from Last 1 Encounters:  01/28/21 64.9 kg    Ideal Body Weight:  51.1 kg  BMI:  Body mass index is 25.75 kg/m.  Estimated Nutritional Needs:   Kcal:  3254-9826  Protein:  85-100 grams  Fluid:  > 1.7 L    [name], RD, LDN, [place] Registered Dietitian II Certified Diabetes Care and Education Specialist Please refer to [facility] for RD and/or RD on-call/weekend/after hours pager

## 2021-01-28 NOTE — Progress Notes (Signed)
PROGRESS NOTE    HPI taken from Dr. Blaine Hamper: [name] is a 80 y.o. female with medical history significant of hypertension, hyperlipidemia, GERD, depression, anxiety, vertigo, anemia, who presents with fall and left hip pain.  Pt states that she fell accidentally when she walked down the stairs and tripped her steps in early AM today.  No loss of consciousness.  No head or neck injury.  She developed pain in left hip, which is severe, constant, sharp, nonradiating.  No leg numbness.  Denies chest pain, shortness breath, cough.  No fever or chills.  No nausea, vomiting, diarrhea, abdominal pain, symptoms of UTI.  ED Course: pt was found to have WBC 5.4, negative Covid PCR, electrolytes renal function okay, temperature normal, blood pressure 97/84, 132/79, heart rate 45, 81, RR 18, oxygen saturation 97% on room air.  Chest x-ray negative.  X-ray showed impacted left femoral neck fracture.  CT head is negative for acute intracranial abnormalities.  Dr. Rudene Christians of Ortho is consulted.  CT-head: 1. No acute intracranial abnormality. 2. Decreased size and conspicuity of the hyperdense focus in the right cerebellar hemisphere with some adjacent parenchymal volume loss, favor sequela of prior hemorrhage. 3. Pneumatized secretion in the left sphenoid sinus. Correlate for acute sinusitis.  [name]  MWN:[id number] DOB: July 31, 1941 DOA: 01/27/2021 PCP: Steele Sizer, MD   Assessment & Plan:   Principal Problem:   Closed left hip fracture [facility]) Active Problems:   Dyslipidemia   GERD (gastroesophageal reflux disease)   Fall at home, initial encounter   Closed left hip fracture: will go for surgery as per ortho surg. Morphine, percocet prn for pain. Zofran prn for nausea/vomiting   HLD: continue on statin   GERD: continue on pepcid prn   Fall: at home. Will consult PT/OT when ok with ortho surg  Hyperglycemia: no hx of DM. Will continue to monitor  DVT prophylaxis: SCDs  (for surgery today) Code Status: full  Family Communication:  Disposition Plan: depends on PT/OT recs (not consulted yet)  Level of care: Med-Surg   Status is: Inpatient  Remains inpatient appropriate because:Ongoing diagnostic testing needed not appropriate for outpatient work up, Unsafe d/c plan, IV treatments appropriate due to intensity of illness or inability to take PO and Inpatient level of care appropriate due to severity of illness   Dispo: The patient is from: Home              Anticipated d/c is to: SNF              Patient currently is not medically stable to d/c.   Difficult to place patient No    Consultants:   Ortho surg    Procedures:    Antimicrobials:    Subjective: Pt c/o hip pain   Objective: Vitals:   01/27/21 2317 01/28/21 0206 01/28/21 0539 01/28/21 0749  BP: (!) 165/71 111/85 (!) 174/63 (!) 191/85  Pulse: 98 (!) 110 (!) 59 (!) 110  Resp: 18 18 18 16   Temp: 98.7 F (37.1 C) 98.4 F (36.9 C) 98 F (36.7 C) 98.4 F (36.9 C)  TempSrc: Oral Oral Oral   SpO2: 91% 95% 96% 97%  Weight:      Height:        Intake/Output Summary (Last 24 hours) at 01/28/2021 0800 Last data filed at 01/28/2021 0537 Gross per 24 hour  Intake -  Output 250 ml  Net -250 ml   Filed Weights   01/27/21 0747  Weight: 64.9 kg  Examination:  General exam: Appears calm but uncomfortable  Respiratory system: Clear to auscultation. Respiratory effort normal. Cardiovascular system: S1 & S2 +. No rubs, gallops or clicks.  Gastrointestinal system: Abdomen is nondistended, soft and nontender.Normal bowel sounds heard. Central nervous system: Alert and oriented. Moves all extremities  Psychiatry: Judgement and insight appear normal. Flat mood and affect     Data Reviewed: I have personally reviewed following labs and imaging studies  CBC: Recent Labs  Lab 01/27/21 0746 01/28/21 0443  WBC 5.4 9.5  HGB 13.3 13.3  HCT 40.5 39.9  MCV 87.1 85.8  PLT 196 573    Basic Metabolic Panel: Recent Labs  Lab 01/27/21 0746 01/28/21 0443  NA 140 137  K 3.5 3.9  CL 107 102  CO2 26 26  GLUCOSE 102* 116*  BUN 14 14  CREATININE 0.69 0.67  CALCIUM 9.1 9.1   GFR: Estimated Creatinine Clearance: 50.2 mL/min (by C-G formula based on SCr of 0.67 mg/dL). Liver Function Tests: No results for input(s): AST, ALT, ALKPHOS, BILITOT, PROT, ALBUMIN in the last 168 hours. No results for input(s): LIPASE, AMYLASE in the last 168 hours. No results for input(s): AMMONIA in the last 168 hours. Coagulation Profile: Recent Labs  Lab 01/28/21 0443  INR 1.0   Cardiac Enzymes: No results for input(s): CKTOTAL, CKMB, CKMBINDEX, TROPONINI in the last 168 hours. BNP (last 3 results) No results for input(s): PROBNP in the last 8760 hours. HbA1C: No results for input(s): HGBA1C in the last 72 hours. CBG: No results for input(s): GLUCAP in the last 168 hours. Lipid Profile: No results for input(s): CHOL, HDL, LDLCALC, TRIG, CHOLHDL, LDLDIRECT in the last 72 hours. Thyroid Function Tests: No results for input(s): TSH, T4TOTAL, FREET4, T3FREE, THYROIDAB in the last 72 hours. Anemia Panel: No results for input(s): VITAMINB12, FOLATE, FERRITIN, TIBC, IRON, RETICCTPCT in the last 72 hours. Sepsis Labs: No results for input(s): PROCALCITON, LATICACIDVEN in the last 168 hours.  Recent Results (from the past 240 hour(s))  Resp Panel by RT-PCR (Flu A&B, Covid) Nasopharyngeal Swab     Status: None   Collection Time: 01/27/21  7:46 AM   Specimen: Nasopharyngeal Swab; Nasopharyngeal(NP) swabs in vial transport medium  Result Value Ref Range Status   SARS Coronavirus 2 by RT PCR NEGATIVE NEGATIVE Final    Comment: (NOTE) SARS-CoV-2 target nucleic acids are NOT DETECTED.  The SARS-CoV-2 RNA is generally detectable in upper respiratory specimens during the acute phase of infection. The lowest concentration of SARS-CoV-2 viral copies this assay can detect is 138 copies/mL.  A negative result does not preclude SARS-Cov-2 infection and should not be used as the sole basis for treatment or other patient management decisions. A negative result may occur with  improper specimen collection/handling, submission of specimen other than nasopharyngeal swab, presence of viral mutation(s) within the areas targeted by this assay, and inadequate number of viral copies(<138 copies/mL). A negative result must be combined with clinical observations, patient history, and epidemiological information. The expected result is Negative.  Fact Sheet for Patients:  EntrepreneurPulse.com.au  Fact Sheet for Healthcare Providers:  IncredibleEmployment.be  This test is no t yet approved or cleared by the [place] FDA and  has been authorized for detection and/or diagnosis of SARS-CoV-2 by FDA under an Emergency Use Authorization (EUA). This EUA will remain  in effect (meaning this test can be used) for the duration of the COVID-19 declaration under Section 564(b)(1) of the Act, 21 U.S.C.section 360bbb-3(b)(1), unless the authorization is terminated  or revoked sooner.       Influenza A by PCR NEGATIVE NEGATIVE Final   Influenza B by PCR NEGATIVE NEGATIVE Final    Comment: (NOTE) The Xpert Xpress SARS-CoV-2/FLU/RSV plus assay is intended as an aid in the diagnosis of influenza from Nasopharyngeal swab specimens and should not be used as a sole basis for treatment. Nasal washings and aspirates are unacceptable for Xpert Xpress SARS-CoV-2/FLU/RSV testing.  Fact Sheet for Patients: EntrepreneurPulse.com.au  Fact Sheet for Healthcare Providers: IncredibleEmployment.be  This test is not yet approved or cleared by the [place] FDA and has been authorized for detection and/or diagnosis of SARS-CoV-2 by FDA under an Emergency Use Authorization (EUA). This EUA will remain in effect (meaning this test  can be used) for the duration of the COVID-19 declaration under Section 564(b)(1) of the Act, 21 U.S.C. section 360bbb-3(b)(1), unless the authorization is terminated or revoked.  Performed at [facility], [address] [place], [place] [address]          Radiology Studies: DG Chest 1 View  Result Date: 01/27/2021 CLINICAL DATA:  Hip fracture preop EXAM: CHEST  1 VIEW COMPARISON:  08/16/2017 FINDINGS: The heart size and mediastinal contours are within normal limits. Both lungs are clear. The visualized skeletal structures are unremarkable. IMPRESSION: No active disease. Electronically Signed   By: Franchot Gallo M.D.   On: 01/27/2021 08:47   CT HEAD WO CONTRAST  Result Date: 01/27/2021 CLINICAL DATA:  Fall today with left hip fracture, denies headache or head injury EXAM: CT HEAD WITHOUT CONTRAST TECHNIQUE: Contiguous axial images were obtained from the base of the skull through the vertex without intravenous contrast. COMPARISON:  Head CT August 23, 2014. FINDINGS: Brain: No evidence of acute large vascular territory infarction, hemorrhage, hydrocephalus, extra-axial collection or mass lesion/mass effect. Decreased size and conspicuity of the hyperdense focus in the the the along the medial aspect of the right cerebellar hemisphere which now measures 2-3 mm on image 10/2 and 49/4 previously 7 mm some corresponding parenchymal volume loss, favor sequela of prior hemorrhage. Vascular: No hyperdense vessel. Atherosclerotic calcifications of the carotid siphon. Skull: Benign hyperostosis frontalis. Negative for fracture or focal lesion. Sinuses/Orbits: Pneumatized secretion in the left sphenoid sinus otherwise the paranasal and maxillary sinuses are predominantly clear. Other: None IMPRESSION: 1. No acute intracranial abnormality. 2. Decreased size and conspicuity of the hyperdense focus in the right cerebellar hemisphere with some adjacent parenchymal volume loss, favor sequela of  prior hemorrhage. 3. Pneumatized secretion in the left sphenoid sinus. Correlate for acute sinusitis. Electronically Signed   By: Dahlia Bailiff MD   On: 01/27/2021 11:48   DG HIP UNILAT WITH PELVIS 2-3 VIEWS LEFT  Result Date: 01/27/2021 CLINICAL DATA:  Fall EXAM: DG HIP (WITH OR WITHOUT PELVIS) 2-3V LEFT COMPARISON:  None. FINDINGS: Fracture left femoral neck with impaction and mild angulation. Left hip joint normal. No other pelvic fracture. Right hip appears normal. IMPRESSION: Impacted fracture left femoral neck. Electronically Signed   By: Franchot Gallo M.D.   On: 01/27/2021 08:46        Scheduled Meds: . brimonidine  1 drop Both Eyes TID  . fluticasone  2 spray Each Nare Daily  . latanoprost  1 drop Both Eyes QHS  . losartan  25 mg Oral Daily  . melatonin  5 mg Oral QHS  . pravastatin  20 mg Oral Daily   Continuous Infusions: .  ceFAZolin (ANCEF) IV       LOS: 1  day    Time spent: 32 mins     Wyvonnia Dusky, MD [facility] Pager [phone]  If 7PM-7AM, please contact night-coverage 01/28/2021, 8:00 AM

## 2021-01-28 NOTE — Anesthesia Procedure Notes (Signed)
Date/Time: 01/28/2021 12:45 PM Performed by: Martie Round, RN Pre-anesthesia Checklist: Patient identified, Emergency Drugs available, Suction available, Patient being monitored and Timeout performed Oxygen Delivery Method: Simple face mask Induction Type: IV induction

## 2021-01-28 NOTE — Anesthesia Preprocedure Evaluation (Addendum)
Anesthesia Evaluation  Patient identified by MRN, date of birth, ID band Patient awake    Reviewed: Allergy & Precautions, NPO status , Patient's Chart, lab work & pertinent test results  History of Anesthesia Complications (+) PONV and history of anesthetic complications  Airway Mallampati: II       Dental   Pulmonary neg sleep apnea, neg COPD, Not current smoker,           Cardiovascular hypertension, Pt. on medications (-) Past MI and (-) CHF (-) dysrhythmias (-) Valvular Problems/Murmurs     Neuro/Psych neg Seizures Anxiety Depression    GI/Hepatic Neg liver ROS, GERD  Medicated and Controlled,  Endo/Other  neg diabetes  Renal/GU negative Renal ROS     Musculoskeletal   Abdominal   Peds  Hematology  (+) anemia ,   Anesthesia Other Findings   Reproductive/Obstetrics                            Anesthesia Physical Anesthesia Plan  ASA: III  Anesthesia Plan: Spinal   Post-op Pain Management:    Induction:   PONV Risk Score and Plan:   Airway Management Planned:   Additional Equipment:   Intra-op Plan:   Post-operative Plan:   Informed Consent: I have reviewed the patients History and Physical, chart, labs and discussed the procedure including the risks, benefits and alternatives for the proposed anesthesia with the patient or authorized representative who has indicated his/her understanding and acceptance.       Plan Discussed with:   Anesthesia Plan Comments:         Anesthesia Quick Evaluation

## 2021-01-28 NOTE — Anesthesia Procedure Notes (Addendum)
Spinal  Patient location during procedure: OR Start time: 01/28/2021 12:38 PM End time: 01/28/2021 12:48 PM Staffing Performed: anesthesiologist  Anesthesiologist: Gunnar Fusi, MD Preanesthetic Checklist Completed: patient identified, IV checked, site marked, risks and benefits discussed, surgical consent, monitors and equipment checked, pre-op evaluation and timeout performed Spinal Block Patient position: right lateral decubitus Prep: Betadine Patient monitoring: heart rate, continuous pulse ox, blood pressure and cardiac monitor Approach: midline Location: L3-4 Injection technique: single-shot Needle Needle type: Whitacre and Introducer  Needle gauge: 25 G Needle length: 9 cm Assessment Sensory level: T10 Additional Notes Negative paresthesia. Negative blood return. Positive free-flowing CSF. Expiration date of kit checked and confirmed. Patient tolerated procedure well, without complications.

## 2021-01-28 NOTE — Progress Notes (Signed)
Pt states she has no jewelry or underwear on. CNA, lisa , states the pt had a CHG bath this am. Pt has no questions at this time. Pain medication given as ordered.

## 2021-01-29 ENCOUNTER — Encounter: Payer: Self-pay | Admitting: Orthopedic Surgery

## 2021-01-29 DIAGNOSIS — H409 Unspecified glaucoma: Secondary | ICD-10-CM

## 2021-01-29 DIAGNOSIS — R7301 Impaired fasting glucose: Secondary | ICD-10-CM

## 2021-01-29 DIAGNOSIS — I959 Hypotension, unspecified: Secondary | ICD-10-CM

## 2021-01-29 DIAGNOSIS — S72002D Fracture of unspecified part of neck of left femur, subsequent encounter for closed fracture with routine healing: Secondary | ICD-10-CM

## 2021-01-29 LAB — CBC
HCT: 32 % — ABNORMAL LOW (ref 36.0–46.0)
Hemoglobin: 10.7 g/dL — ABNORMAL LOW (ref 12.0–15.0)
MCH: 29.1 pg (ref 26.0–34.0)
MCHC: 33.4 g/dL (ref 30.0–36.0)
MCV: 87 fL (ref 80.0–100.0)
Platelets: 169 10*3/uL (ref 150–400)
RBC: 3.68 MIL/uL — ABNORMAL LOW (ref 3.87–5.11)
RDW: 13.7 % (ref 11.5–15.5)
WBC: 8.3 10*3/uL (ref 4.0–10.5)
nRBC: 0 % (ref 0.0–0.2)

## 2021-01-29 LAB — BASIC METABOLIC PANEL
Anion gap: 6 (ref 5–15)
BUN: 15 mg/dL (ref 8–23)
CO2: 28 mmol/L (ref 22–32)
Calcium: 8.1 mg/dL — ABNORMAL LOW (ref 8.9–10.3)
Chloride: 106 mmol/L (ref 98–111)
Creatinine, Ser: 0.73 mg/dL (ref 0.44–1.00)
GFR, Estimated: >60 mL/min (ref >60)
Glucose, Bld: 114 mg/dL — ABNORMAL HIGH (ref 70–99)
Potassium: 3.6 mmol/L (ref 3.5–5.1)
Sodium: 140 mmol/L (ref 135–145)

## 2021-01-29 MED ORDER — SODIUM CHLORIDE 0.9 % IV BOLUS
250.0000 mL | Freq: Once | INTRAVENOUS | Status: AC
  Administered 2021-01-29: 250 mL via INTRAVENOUS

## 2021-01-29 NOTE — Evaluation (Signed)
Occupational Therapy Evaluation Patient Details Name: [name] MRN: [id number] DOB: 01-25-1941 Today's Date: 01/29/2021    History of Present Illness s/p L total hip arthroplasty anterior approach following a mechanical fall resulting in a left hip fracture w/ displaced femoral neck   Clinical Impression   Pt seen for OT evaluation this date, POD#1 from above surgery. Pt was independent in all ADLs prior to surgery, drives, works part-time, and is A[employer]Ox[employer]. Pt lives alone in single-level house, 3 STE. Pain in L hip 8/10 at present. Pt currently requires Mod A for bed mobility and t/fs, with Max A for moving L LE. Provided educ re: self care skills, falls prevention strategies, home/routines modifications, DME/AE, compression stocking mgt strategies, and car transfer techniques. Pt would benefit from continued OT while hospitalized. Recommend STR post DC until pt is safe to return home alone.      Follow Up Recommendations  SNF    Equipment Recommendations  Other (comment) (RW)    Recommendations for Other Services       Precautions / Restrictions Precautions Precautions: Anterior Hip;Fall Restrictions Weight Bearing Restrictions: Yes LLE Weight Bearing: Weight bearing as tolerated      Mobility Bed Mobility Overal bed mobility: Needs Assistance Bed Mobility: Supine to Sit;Sit to Supine     Supine to sit: Mod assist Sit to supine: Mod assist        Transfers                 General transfer comment: pt declined    Balance Overall balance assessment: Needs assistance Sitting-balance support: Bilateral upper extremity supported Sitting balance-Leahy Scale: Fair Sitting balance - Comments: pt displaying R lateral lean, guarding on L side Postural control: Right lateral lean                                 ADL either performed or assessed with clinical judgement   ADL Overall ADL's : Needs assistance/impaired Eating/Feeding:  Independent                   Lower Body Dressing: Maximal assistance   Toilet Transfer: Maximal assistance                   Vision Patient Visual Report: No change from baseline       Perception     Praxis      Pertinent Vitals/Pain Pain Assessment: 0-10 Pain Score: 8  Pain Location: L hip Pain Descriptors / Indicators: Grimacing;Guarding;Aching Pain Intervention(s): Limited activity within patient's tolerance;Monitored during session;Premedicated before session;Repositioned     Hand Dominance Right   Extremity/Trunk Assessment Upper Extremity Assessment Upper Extremity Assessment: Overall WFL for tasks assessed   Lower Extremity Assessment Lower Extremity Assessment: LLE deficits/detail LLE Deficits / Details: s/p THA LLE: Unable to fully assess due to pain LLE Sensation: WNL       Communication Communication Communication: No difficulties   Cognition Arousal/Alertness: Awake/alert Behavior During Therapy: WFL for tasks assessed/performed Overall Cognitive Status: Within Functional Limits for tasks assessed                                     General Comments       Exercises Total Joint Exercises Ankle Circles/Pumps: AROM;10 reps;Both;Supine Other Exercises Other Exercises: Pt educ re: AE, falls prevention, DC recs   Shoulder Instructions  Home Living Family/patient expects to be discharged to:: Private residence Living Arrangements: Alone Available Help at Discharge: Available PRN/intermittently (Pt's son and sister both live 2 hrs away, will visit on occasion) Type of Home: House Home Access: Stairs to enter [employer] of Steps: 3 Entrance Stairs-Rails: Left;Right;Can reach both [place]: One level     Bathroom Shower/Tub: Occupational psychologist: Standard Bathroom Accessibility: Yes   Home Equipment: Environmental consultant - 2 wheels;Shower seat          Prior Functioning/Environment Level of  Independence: Independent                 OT Problem List: Decreased strength;Decreased range of motion;Decreased activity tolerance;Impaired balance (sitting and/or standing);Pain;Decreased knowledge of use of DME or AE      OT Treatment/Interventions: Self-care/ADL training;Therapeutic exercise;Patient/family education;Balance training;Therapeutic activities;DME and/or AE instruction;Energy conservation    OT Goals(Current goals can be found in the care plan section) Acute Rehab OT Goals Patient Stated Goal: to get home OT Goal Formulation: With patient Time For Goal Achievement: 02/12/21 Potential to Achieve Goals: Good ADL Goals Pt Will Perform Lower Body Dressing: with modified independence (using AE as needed) Pt Will Transfer to Toilet: with modified independence (using LRAD) Pt Will Perform Toileting - Clothing Manipulation and hygiene: with modified independence;sit to/from stand  OT Frequency: Min 2X/week   Barriers to D/C:            Co-evaluation              AM-PAC OT "6 Clicks" Daily Activity     Outcome Measure Help from another person eating meals?: None Help from another person taking care of personal grooming?: None Help from another person toileting, which includes using toliet, bedpan, or urinal?: A Lot Help from another person bathing (including washing, rinsing, drying)?: A Lot Help from another person to put on and taking off regular upper body clothing?: None Help from another person to put on and taking off regular lower body clothing?: A Lot 6 Click Score: 18   End of Session    Activity Tolerance: Patient tolerated treatment well;No increased pain Patient left: in bed;with call bell/phone within reach;with bed alarm set;with family/visitor present  OT Visit Diagnosis: Unsteadiness on feet (R26.81);Other abnormalities of gait and mobility (R26.89);Muscle weakness (generalized) (M62.81);Pain Pain - Right/Left: Left Pain - part of body:  Hip                Time: 5520-8022 OT Time Calculation (min): 39 min Charges:  OT General Charges $OT Visit: 1 Visit OT Evaluation $OT Eval Low Complexity: 1 Low OT Treatments $Self Care/Home Management : 38-52 mins  Josiah Lobo, PhD, MS, OTR/L 01/29/21, 1:25 PM

## 2021-01-29 NOTE — Progress Notes (Signed)
   Subjective: 1 Day Post-Op Procedure(s) (LRB): TOTAL HIP ARTHROPLASTY ANTERIOR APPROACH (Left) Patient reports pain as mild.   Patient is well, and has had no acute complaints or problems Denies any CP, SOB, ABD pain. We will continue therapy today.   Objective: Vital signs in last 24 hours: Temp:  [97.6 F (36.4 C)-100.1 F (37.8 C)] 100 F (37.8 C) (03/16 0456) Pulse Rate:  [85-104] 103 (03/16 0456) Resp:  [14-27] 20 (03/16 0456) BP: (65-167)/(52-87) 121/52 (03/16 0456) SpO2:  [86 %-100 %] 97 % (03/16 0456) Weight:  [64.9 kg] 64.9 kg (03/15 1150)  Intake/Output from previous day: 03/15 0701 - 03/16 0700 In: 2624.6 [I.V.:2524.6; IV Piggyback:100] Out: 1100 [Urine:1000; Blood:100] Intake/Output this shift: No intake/output data recorded.  Recent Labs    01/27/21 0746 01/28/21 0443 01/29/21 0539  HGB 13.3 13.3 10.7*   Recent Labs    01/28/21 0443 01/29/21 0539  WBC 9.5 8.3  RBC 4.65 3.68*  HCT 39.9 32.0*  PLT 210 169   Recent Labs    01/28/21 0443 01/29/21 0539  NA 137 140  K 3.9 3.6  CL 102 106  CO2 26 28  BUN 14 15  CREATININE 0.67 0.73  GLUCOSE 116* 114*  CALCIUM 9.1 8.1*   Recent Labs    01/28/21 0443  INR 1.0    EXAM General - Patient is Alert, Appropriate and Oriented Extremity - Neurovascular intact Sensation intact distally Intact pulses distally Dorsiflexion/Plantar flexion intact No cellulitis present Compartment soft Dressing - dressing C/D/I and no drainage, prevena intact with out drainage Motor Function - intact, moving foot and toes well on exam.   Past Medical History:  Diagnosis Date  . Anemia   . Anxiety    no meds  . Arthritis    hands, knees - no meds  . Bilateral leg weakness   . Complication of anesthesia    Difficult to arouse  . Dental bridge present    perm lower front bottom bridge  . Depression    no meds  . Eyes swollen    and red - was seen by MD for possible allergy to new eye drops  . GERD  (gastroesophageal reflux disease)    diet controlled - only takes med prn basis  . Glaucoma   . Hypercholesteremia   . Hypertension   . SVD (spontaneous vaginal delivery)    x 3 - only one currently living  . Vertigo     Assessment/Plan:   1 Day Post-Op Procedure(s) (LRB): TOTAL HIP ARTHROPLASTY ANTERIOR APPROACH (Left) Principal Problem:   Closed left hip fracture (HCC) Active Problems:   Dyslipidemia   GERD (gastroesophageal reflux disease)   Fall at home, initial encounter  Estimated body mass index is 25.75 kg/m as calculated from the following:   Height as of this encounter: 5' 2.5" (1.588 m).   Weight as of this encounter: 64.9 kg. Advance diet Up with therapy  Work on [employer] and VSS Pain controlled, dc iv fentanyl  Follow up with [facility] in 2 weeks Lovenox 40 mg SQ daily x 14 days at discharge  DVT Prophylaxis - Lovenox, TED hose and SCDs Weight-Bearing as tolerated to left leg   T. Rachelle Hora, PA-C [place] 01/29/2021, 8:14 AM

## 2021-01-29 NOTE — Anesthesia Postprocedure Evaluation (Signed)
Anesthesia Post Note  Patient: [name]  Procedure(s) Performed: TOTAL HIP ARTHROPLASTY ANTERIOR APPROACH (Left Hip)  Patient location during evaluation: Nursing Unit Anesthesia Type: Spinal Level of consciousness: awake and alert and oriented Pain management: pain level controlled Vital Signs Assessment: post-procedure vital signs reviewed and stable Respiratory status: respiratory function stable Cardiovascular status: stable Postop Assessment: no headache, no backache, no apparent nausea or vomiting, patient able to bend at knees, able to ambulate and adequate PO intake Anesthetic complications: no   No complications documented.   Last Vitals:  Vitals:   01/29/21 0456 01/29/21 0824  BP: (!) 121/52 104/61  Pulse: (!) 103 100  Resp: 20 16  Temp: 37.8 C (!) 38.2 C  SpO2: 97% 93%    Last Pain:  Vitals:   01/29/21 0509  TempSrc:   PainSc: Yolanda Manges

## 2021-01-29 NOTE — Evaluation (Signed)
Physical Therapy Evaluation Patient Details Name: [name] MRN: [id number] DOB: [birth year] Today's Date: 01/29/2021   History of Present Illness  s/p L total hip arthroplasty anterior approach following a mechanical fall resulting in a left hip fracture w/ displaced femoral neck     Clinical Impression  Pt received in Semi-Fowler's position and agreeable to therapy.  Pt able to perform bed-level exercises with good technique however requires assistance with SLR and Hip abduction due to weakness of the L LE.  Pt unable to perform without active assistance from therapist.  Pt is able to perform bed mobility with minA from therapist for L LE navigation.  Pt able to utilized FWW with verbal cuing for hand placement and encouragement throughout process.  Pt able to ambulate around the nursing station and back to room.  Pt is slow with movement and needs periodic rest breaks to continue.  Pt transferred back to bed where all needs were met, call bell within reach.  Due to barriers to d/c home, pt would benefit from short-term rehabilitation at SNF facility at this time.      Follow Up Recommendations SNF    Equipment Recommendations  Rolling walker with 5" wheels;3in1 (PT)    Recommendations for Other Services       Precautions / Restrictions Precautions Precautions: Anterior Hip;Fall Restrictions Weight Bearing Restrictions: Yes LLE Weight Bearing: Weight bearing as tolerated      Mobility  Bed Mobility Overal bed mobility: Needs Assistance Bed Mobility: Supine to Sit;Sit to Supine     Supine to sit: Min assist Sit to supine: Min assist   General bed mobility comments: Utilized minA for use LLE navigation into bed    Transfers Overall transfer level: Modified independent Equipment used: Rolling walker (2 wheeled) Transfers: Sit to/from Stand Sit to Stand: Min guard         General transfer comment: Pt able to transfer with CGA for safety first time being  upright.  Ambulation/Gait Ambulation/Gait assistance: Min guard Gait Distance (Feet): 160 Feet Assistive device: Rolling walker (2 wheeled) Gait Pattern/deviations: Step-to pattern;Decreased step length - right;Decreased stance time - left;Decreased stride length Gait velocity: decreased   General Gait Details: Pt able to ambulate around the nursing station, however demonstrates decreased gait speed and requires rest breaks in order to perform.  Stairs            Wheelchair Mobility    Modified Rankin (Stroke Patients Only)       Balance Overall balance assessment: Needs assistance Sitting-balance support: Bilateral upper extremity supported Sitting balance-Leahy Scale: Fair Sitting balance - Comments: R Lateral lean due to pain on L side Postural control: Right lateral lean Standing balance support: Bilateral upper extremity supported Standing balance-Leahy Scale: Fair Standing balance comment: Pt able to demonstrate good weight displacement of LE's and good use of the UE's.                             Pertinent Vitals/Pain Pain Assessment: 0-10 Pain Score: 8  Pain Location: L hip Pain Descriptors / Indicators: Grimacing;Guarding;Aching Pain Intervention(s): Limited activity within patient's tolerance;Monitored during session;Premedicated before session;Repositioned    Home Living Family/patient expects to be discharged to:: Private residence Living Arrangements: Alone Available Help at Discharge: Available PRN/intermittently (Pt's son and sister both live 2 hrs away, will visit on occasion) Type of Home: House Home Access: Stairs to enter Entrance Stairs-Rails: Left;Right;Can reach both Entrance Stairs-Number of Steps: 3  Home Layout: One level Home Equipment: Walker - 2 wheels;Shower seat      Prior Function Level of Independence: Independent               Hand Dominance   Dominant Hand: Right    Extremity/Trunk Assessment   Upper  Extremity Assessment Upper Extremity Assessment: Overall WFL for tasks assessed    Lower Extremity Assessment Lower Extremity Assessment: LLE deficits/detail LLE Deficits / Details: s/p THA LLE: Unable to fully assess due to pain LLE Sensation: WNL       Communication   Communication: No difficulties  Cognition Arousal/Alertness: Awake/alert Behavior During Therapy: WFL for tasks assessed/performed Overall Cognitive Status: Within Functional Limits for tasks assessed                                        General Comments      Exercises Total Joint Exercises Ankle Circles/Pumps: AROM;Strengthening;Both;10 reps;Supine Quad Sets: AROM;Strengthening;Both;10 reps;Supine Gluteal Sets: AROM;Strengthening;Both;10 reps;Supine Heel Slides: AROM;Strengthening;Both;10 reps;Supine Hip ABduction/ADduction: AROM;Right;AAROM;Left;Strengthening;10 reps;Supine Straight Leg Raises: AROM;Right;AAROM;Left;Strengthening;10 reps;Supine Marching in Standing: AROM;Strengthening;Both;10 reps;Standing Other Exercises Other Exercises: Pt educ re: AE, falls prevention, DC recs Other Exercises: Pt and family educated on role of PT and services provided during hospital stay.  Both were also educated on the physiological benefits of exercising during hospital stay.   Assessment/Plan    PT Assessment Patient needs continued PT services  PT Problem List Decreased strength;Decreased range of motion;Decreased activity tolerance;Decreased balance;Decreased mobility;Decreased knowledge of use of DME;Decreased safety awareness;Pain       PT Treatment Interventions DME instruction;Gait training;Stair training;Functional mobility training;Therapeutic activities;Therapeutic exercise;Balance training;Neuromuscular re-education    PT Goals (Current goals can be found in the Care Plan section)  Acute Rehab PT Goals Patient Stated Goal: to go to short-term rehab facility and get stronger. PT Goal  Formulation: With patient/family Time For Goal Achievement: 02/12/21 Potential to Achieve Goals: Good    Frequency BID   Barriers to discharge Inaccessible home environment;Decreased caregiver support Pt does not have any caregiver assistance that she will be able to utilize once d/c to home.    Co-evaluation               AM-PAC PT "6 Clicks" Mobility  Outcome Measure Help needed turning from your back to your side while in a flat bed without using bedrails?: A Lot Help needed moving from lying on your back to sitting on the side of a flat bed without using bedrails?: A Lot Help needed moving to and from a bed to a chair (including a wheelchair)?: A Little Help needed standing up from a chair using your arms (e.g., wheelchair or bedside chair)?: A Little Help needed to walk in hospital room?: A Little Help needed climbing 3-5 steps with a railing? : A Lot 6 Click Score: 15    End of Session Equipment Utilized During Treatment: Gait belt Activity Tolerance: Patient tolerated treatment well Patient left: in bed;with call bell/phone within reach;with bed alarm set;with family/visitor present Nurse Communication: Mobility status PT Visit Diagnosis: Unsteadiness on feet (R26.81);Other abnormalities of gait and mobility (R26.89);Muscle weakness (generalized) (M62.81);Difficulty in walking, not elsewhere classified (R26.2);Pain Pain - Right/Left: Left Pain - part of body: Hip    Time: 3419-3790 PT Time Calculation (min) (ACUTE ONLY): 49 min   Charges:   PT Evaluation $PT Eval Low Complexity: 1 Low PT Treatments $Gait Training: 23-37 mins $  Therapeutic Exercise: 8-22 mins        Gwenlyn Saran, PT, DPT 01/29/21, 3:33 PM

## 2021-01-29 NOTE — Progress Notes (Signed)
Physical Therapy Treatment Patient Details Name: [name] MRN: [id number] DOB: 02/16/1941 Today's Date: 01/29/2021    History of Present Illness s/p L total hip arthroplasty anterior approach following a mechanical fall resulting in a left hip fracture w/ displaced femoral neck    PT Comments    Pt received in Semi-Fowler's position and agreeable to therapy.  Pt able to navigate bed-mobility with minA from therapist for L LE due to lack of strength.  Once upright, pt mobilizes well.  Pt able to ambulate around nursing station and to the therapy gym where stair training was performed.  Pt able to navigate well with therapist, but does not have the assistance at home for safe d/c at this time.  Pt will need assistance for utilizing the FWW at the top/bottom of the stairs once at home.  Pt was able to ambulate and perform bed mobility again with assistance from therapist for L LE.  All nees met and call bell placed within reach.  Current discharge plans to SNF remain appropriate at this time.  Pt will continue to benefit from skilled therapy in order to address deficits listed below.     Follow Up Recommendations  SNF     Equipment Recommendations  Rolling walker with 5" wheels;3in1 (PT)    Recommendations for Other Services       Precautions / Restrictions Precautions Precautions: Anterior Hip;Fall Restrictions Weight Bearing Restrictions: Yes LLE Weight Bearing: Weight bearing as tolerated    Mobility  Bed Mobility Overal bed mobility: Needs Assistance Bed Mobility: Supine to Sit;Sit to Supine     Supine to sit: Min assist Sit to supine: Min assist   General bed mobility comments: Utilized minA for use LLE navigation into bed    Transfers Overall transfer level: Modified independent Equipment used: Rolling walker (2 wheeled) Transfers: Sit to/from Stand Sit to Stand: Min guard         General transfer comment: Pt able to transfer with CGA for safety first  time being upright.  Ambulation/Gait Ambulation/Gait assistance: Min guard Gait Distance (Feet): 200 Feet Assistive device: Rolling walker (2 wheeled) Gait Pattern/deviations: Step-to pattern;Decreased step length - right;Decreased stance time - left;Decreased stride length Gait velocity: decreased   General Gait Details: Pt able to ambulate around the nursing station, and performed stair training in therapy gym.   Stairs Stairs: Yes Stairs assistance: Min guard Stair Management: Two rails;Step to pattern;Forwards Number of Stairs: 4 General stair comments: Pt performed well with stairs, however will need assistance for maneuvering the FWW to the top of the stairs and bottom when arriving/departing the home.   Wheelchair Mobility    Modified Rankin (Stroke Patients Only)       Balance Overall balance assessment: Needs assistance Sitting-balance support: Bilateral upper extremity supported Sitting balance-Leahy Scale: Fair Sitting balance - Comments: R Lateral lean due to pain on L side Postural control: Right lateral lean Standing balance support: Bilateral upper extremity supported Standing balance-Leahy Scale: Fair Standing balance comment: Pt able to demonstrate good weight displacement of LE's and good use of the UE's.                            Cognition Arousal/Alertness: Awake/alert Behavior During Therapy: WFL for tasks assessed/performed Overall Cognitive Status: Within Functional Limits for tasks assessed  Exercises Total Joint Exercises Ankle Circles/Pumps: AROM;Strengthening;Both;10 reps;Supine Quad Sets: AROM;Strengthening;Both;10 reps;Supine Gluteal Sets: AROM;Strengthening;Both;10 reps;Supine Heel Slides: AROM;Strengthening;Both;10 reps;Supine Hip ABduction/ADduction: AROM;Right;AAROM;Left;Strengthening;10 reps;Supine Straight Leg Raises: AROM;Right;AAROM;Left;Strengthening;10  reps;Supine Marching in Standing: AROM;Strengthening;Both;10 reps;Standing Other Exercises Other Exercises: Pt and family educated on role of PT and services provided during hospital stay.  Both were also educated on the physiological benefits of exercising during hospital stay.    General Comments        Pertinent Vitals/Pain Pain Assessment: Faces Pain Score: 8  Faces Pain Scale: Hurts little more Pain Location: L hip Pain Descriptors / Indicators: Grimacing;Guarding;Aching Pain Intervention(s): Limited activity within patient's tolerance;Monitored during session;Premedicated before session;Repositioned    Home Living                      Prior Function            PT Goals (current goals can now be found in the care plan section) Acute Rehab PT Goals Patient Stated Goal: to go to short-term rehab facility and get stronger. PT Goal Formulation: With patient/family Time For Goal Achievement: 02/12/21 Potential to Achieve Goals: Good Progress towards PT goals: Progressing toward goals    Frequency    BID      PT Plan      Co-evaluation              AM-PAC PT "6 Clicks" Mobility   Outcome Measure  Help needed turning from your back to your side while in a flat bed without using bedrails?: A Lot Help needed moving from lying on your back to sitting on the side of a flat bed without using bedrails?: A Lot Help needed moving to and from a bed to a chair (including a wheelchair)?: A Little Help needed standing up from a chair using your arms (e.g., wheelchair or bedside chair)?: A Little Help needed to walk in hospital room?: A Little Help needed climbing 3-5 steps with a railing? : A Little 6 Click Score: 16    End of Session Equipment Utilized During Treatment: Gait belt Activity Tolerance: Patient tolerated treatment well Patient left: in bed;with call bell/phone within reach;with bed alarm set;with family/visitor present Nurse Communication:  Mobility status PT Visit Diagnosis: Unsteadiness on feet (R26.81);Other abnormalities of gait and mobility (R26.89);Muscle weakness (generalized) (M62.81);Difficulty in walking, not elsewhere classified (R26.2);Pain Pain - Right/Left: Left Pain - part of body: Hip     Time: 2202-5427 PT Time Calculation (min) (ACUTE ONLY): 40 min  Charges:  $Gait Training: 38-52 mins $Therapeutic Exercise: 8-22 mins                     Gwenlyn Saran, PT, DPT 01/29/21, 6:10 PM

## 2021-01-29 NOTE — Progress Notes (Signed)
[name], female   DOB: [birth year], 80 y.o.   MRN: [id number] [facility] PROGRESS NOTE  Jezel Basto Duca GUR:[id number] DOB: Jan 08, 1941 DOA: 01/27/2021 PCP: Steele Sizer, MD  HPI/Subjective: Patient feeling okay.  Has some hip pain postoperatively.  Objective: Vitals:   01/29/21 1205 01/29/21 1516  BP: (!) 102/49 (!) 95/49  Pulse: 94 93  Resp: 16 16  Temp: 99.6 F (37.6 C) 99.3 F (37.4 C)  SpO2: 96% 92%    Intake/Output Summary (Last 24 hours) at 01/29/2021 1551 Last data filed at 01/29/2021 1343 Gross per 24 hour  Intake 1684.58 ml  Output 400 ml  Net 1284.58 ml   Filed Weights   01/27/21 0747 01/28/21 1150  Weight: 64.9 kg 64.9 kg    ROS: Review of Systems  Respiratory: Negative for cough and shortness of breath.   Cardiovascular: Negative for chest pain.  Gastrointestinal: Negative for abdominal pain, nausea and vomiting.  Musculoskeletal: Positive for joint pain.   Exam: Physical Exam HENT:     Head: Normocephalic.     Mouth/Throat:     Pharynx: No oropharyngeal exudate.  Eyes:     General: Lids are normal.     Conjunctiva/sclera: Conjunctivae normal.  Cardiovascular:     Rate and Rhythm: Normal rate and regular rhythm.     Heart sounds: Normal heart sounds, S1 normal and S2 normal.  Pulmonary:     Breath sounds: Normal breath sounds. No decreased breath sounds, wheezing, rhonchi or rales.  Abdominal:     Palpations: Abdomen is soft.     Tenderness: There is no abdominal tenderness.  Musculoskeletal:     Right lower leg: No swelling.     Left lower leg: No swelling.  Skin:    General: Skin is warm.     Findings: No rash.  Neurological:     Mental Status: She is alert and oriented to person, place, and time.     Comments: Unable to straight leg raise with left leg.  Able to straight leg raise right leg       Data Reviewed: Basic Metabolic Panel: Recent Labs  Lab 01/27/21 0746 01/28/21 0443 01/29/21 0539   NA 140 137 140  K 3.5 3.9 3.6  CL 107 102 106  CO2 26 26 28   GLUCOSE 102* 116* 114*  BUN 14 14 15   CREATININE 0.69 0.67 0.73  CALCIUM 9.1 9.1 8.1*   CBC: Recent Labs  Lab 01/27/21 0746 01/28/21 0443 01/29/21 0539  WBC 5.4 9.5 8.3  HGB 13.3 13.3 10.7*  HCT 40.5 39.9 32.0*  MCV 87.1 85.8 87.0  PLT 196 210 169     Recent Results (from the past 240 hour(s))  Resp Panel by RT-PCR (Flu A&B, Covid) Nasopharyngeal Swab     Status: None   Collection Time: 01/27/21  7:46 AM   Specimen: Nasopharyngeal Swab; Nasopharyngeal(NP) swabs in vial transport medium  Result Value Ref Range Status   SARS Coronavirus 2 by RT PCR NEGATIVE NEGATIVE Final    Comment: (NOTE) SARS-CoV-2 target nucleic acids are NOT DETECTED.  The SARS-CoV-2 RNA is generally detectable in upper respiratory specimens during the acute phase of infection. The lowest concentration of SARS-CoV-2 viral copies this assay can detect is 138 copies/mL. A negative result does not preclude SARS-Cov-2 infection and should not be used as the sole basis for treatment or other patient management decisions. A negative result may occur with  improper specimen collection/handling, submission of specimen other than nasopharyngeal swab, presence  of viral mutation(s) within the areas targeted by this assay, and inadequate number of viral copies(<138 copies/mL). A negative result must be combined with clinical observations, patient history, and epidemiological information. The expected result is Negative.  Fact Sheet for Patients:  EntrepreneurPulse.com.au  Fact Sheet for Healthcare Providers:  IncredibleEmployment.be  This test is no t yet approved or cleared by the [place] FDA and  has been authorized for detection and/or diagnosis of SARS-CoV-2 by FDA under an Emergency Use Authorization (EUA). This EUA will remain  in effect (meaning this test can be used) for the duration of  the COVID-19 declaration under Section 564(b)(1) of the Act, 21 U.S.C.section 360bbb-3(b)(1), unless the authorization is terminated  or revoked sooner.       Influenza A by PCR NEGATIVE NEGATIVE Final   Influenza B by PCR NEGATIVE NEGATIVE Final    Comment: (NOTE) The Xpert Xpress SARS-CoV-2/FLU/RSV plus assay is intended as an aid in the diagnosis of influenza from Nasopharyngeal swab specimens and should not be used as a sole basis for treatment. Nasal washings and aspirates are unacceptable for Xpert Xpress SARS-CoV-2/FLU/RSV testing.  Fact Sheet for Patients: EntrepreneurPulse.com.au  Fact Sheet for Healthcare Providers: IncredibleEmployment.be  This test is not yet approved or cleared by the [place] FDA and has been authorized for detection and/or diagnosis of SARS-CoV-2 by FDA under an Emergency Use Authorization (EUA). This EUA will remain in effect (meaning this test can be used) for the duration of the COVID-19 declaration under Section 564(b)(1) of the Act, 21 U.S.C. section 360bbb-3(b)(1), unless the authorization is terminated or revoked.  Performed at [facility], [address] [place], [place] [address]      Studies: [place] HIP OPERATIVE UNILAT W OR W/O PELVIS LEFT  Result Date: 01/28/2021 CLINICAL DATA:  Intraoperative imaging for left hip replacement. The patient suffered a left hip fracture in a fall 01/27/2021. EXAM: OPERATIVE LEFT HIP (WITH PELVIS IF PERFORMED) 2 VIEWS TECHNIQUE: Fluoroscopic spot image(s) were submitted for interpretation post-operatively. COMPARISON:  Plain films left hip 01/27/2021. FINDINGS: Two fluoroscopic spot views are provided. The first image demonstrates the patient's femoral neck fracture. On the second image, a left hip arthroplasty is in place. The femoral component is incompletely visualized. No acute abnormality is seen. IMPRESSION: Intraoperative imaging for left hip  replacement. No acute abnormality. Electronically Signed   By: Inge Rise M.D.   On: 01/28/2021 15:00   DG HIP UNILAT W OR W/O PELVIS 2-3 VIEWS LEFT  Result Date: 01/28/2021 CLINICAL DATA:  Intraoperative imaging for left hip replacement. The patient suffered a left hip fracture in a fall 01/27/2021. EXAM: OPERATIVE LEFT HIP (WITH PELVIS IF PERFORMED) 2 VIEWS TECHNIQUE: Fluoroscopic spot image(s) were submitted for interpretation post-operatively. COMPARISON:  Plain films left hip 01/27/2021. FINDINGS: Two fluoroscopic spot views are provided. The first image demonstrates the patient's femoral neck fracture. On the second image, a left hip arthroplasty is in place. The femoral component is incompletely visualized. No acute abnormality is seen. IMPRESSION: Intraoperative imaging for left hip replacement. No acute abnormality. Electronically Signed   By: Inge Rise M.D.   On: 01/28/2021 15:00    Scheduled Meds: . brimonidine  1 drop Both Eyes TID  . docusate sodium  100 mg Oral BID  . enoxaparin (LOVENOX) injection  40 mg Subcutaneous Q24H  . fluticasone  2 spray Each Nare Daily  . latanoprost  1 drop Both Eyes QHS  . melatonin  5 mg Oral QHS  . pantoprazole  40 mg Oral Daily  . pravastatin  20 mg Oral Daily   Continuous Infusions: . sodium chloride Stopped (01/29/21 0801)  . methocarbamol (ROBAXIN) IV    . sodium chloride      Assessment/Plan:  1. Left hip fracture close requiring operative repair by Dr. Rudene Christians on 01/28/2021. Patient had a left total hip arthroplasty 2. Relative hypotension.  Hold Cozaar.  Give a fluid bolus. 3. Hyperlipidemia unspecified on pravastatin 4. GERD on Protonix 5. Glaucoma unspecified on brimonidine and latanoprost 6. Impaired fasting glucose        Code Status:     Code Status Orders  (From admission, onward)         Start     Ordered   01/27/21 1109  Full code  Continuous        01/27/21 1108        Code Status History    Date  Active Date Inactive Code Status Order ID Comments User Context   04/02/2020 1159 04/03/2020 2104 Full Code [id number]  Rudi Coco Inpatient   05/30/2018 1107 05/31/2018 2114 Full Code [id number]  Louretta Shorten, MD Inpatient   Advance Care Planning Activity     Family Communication: Spoke with son and sister at the bedside Disposition Plan: Status is: Inpatient  Dispo: The patient is from: Home              Anticipated d/c is to: Rehab              Patient currently postoperative day 1 for left hip fracture   Difficult to place patient.  Hopefully not.  Consultants:  Orthopedic surgery  Procedures:  Left hip arthroplasty  Time spent: 27 minutes  [place]

## 2021-01-30 DIAGNOSIS — E876 Hypokalemia: Secondary | ICD-10-CM

## 2021-01-30 LAB — CBC
HCT: 29.2 % — ABNORMAL LOW (ref 36.0–46.0)
Hemoglobin: 9.6 g/dL — ABNORMAL LOW (ref 12.0–15.0)
MCH: 28.8 pg (ref 26.0–34.0)
MCHC: 32.9 g/dL (ref 30.0–36.0)
MCV: 87.7 fL (ref 80.0–100.0)
Platelets: 140 10*3/uL — ABNORMAL LOW (ref 150–400)
RBC: 3.33 MIL/uL — ABNORMAL LOW (ref 3.87–5.11)
RDW: 13.9 % (ref 11.5–15.5)
WBC: 6.9 10*3/uL (ref 4.0–10.5)
nRBC: 0 % (ref 0.0–0.2)

## 2021-01-30 LAB — BASIC METABOLIC PANEL
Anion gap: 7 (ref 5–15)
BUN: 18 mg/dL (ref 8–23)
CO2: 27 mmol/L (ref 22–32)
Calcium: 8.2 mg/dL — ABNORMAL LOW (ref 8.9–10.3)
Chloride: 104 mmol/L (ref 98–111)
Creatinine, Ser: 0.73 mg/dL (ref 0.44–1.00)
GFR, Estimated: >60 mL/min (ref >60)
Glucose, Bld: 109 mg/dL — ABNORMAL HIGH (ref 70–99)
Potassium: 3.4 mmol/L — ABNORMAL LOW (ref 3.5–5.1)
Sodium: 138 mmol/L (ref 135–145)

## 2021-01-30 LAB — SARS CORONAVIRUS 2 (TAT 6-24 HRS): SARS Coronavirus 2: NEGATIVE

## 2021-01-30 MED ORDER — OXYCODONE-ACETAMINOPHEN 5-325 MG PO TABS: 1.0000 | ORAL_TABLET | ORAL | 0 refills | Status: DC | PRN

## 2021-01-30 MED ORDER — POTASSIUM CHLORIDE CRYS ER 20 MEQ PO TBCR
20.0000 meq | EXTENDED_RELEASE_TABLET | Freq: Once | ORAL | Status: AC
  Administered 2021-01-30: 20 meq via ORAL
  Filled 2021-01-30: qty 1

## 2021-01-30 MED ORDER — TRAZODONE HCL 50 MG PO TABS: 50.0000 mg | ORAL_TABLET | Freq: Every evening | ORAL | Status: DC | PRN

## 2021-01-30 MED ORDER — ENOXAPARIN SODIUM 40 MG/0.4ML ~~LOC~~ SOLN
40.0000 mg | SUBCUTANEOUS | 0 refills | Status: DC
Start: 2021-01-31 — End: 2021-02-20

## 2021-01-30 MED ORDER — POLYETHYLENE GLYCOL 3350 17 G PO PACK
17.0000 g | PACK | Freq: Every day | ORAL | Status: DC
  Administered 2021-01-30 – 2021-01-31 (×2): 17 g via ORAL
  Filled 2021-01-30 (×2): qty 1

## 2021-01-30 NOTE — NC FL2 (Signed)
[place] LEVEL OF CARE SCREENING TOOL     IDENTIFICATION  Patient Name: [name] Birthdate: 01-08-1941 Sex: female Admission Date (Current Location): 01/27/2021  [place] and [place] Number:  Engineering geologist and Address:  [facility], [address], [place], [place] [address]      Provider Number: [id number]  Attending Physician Name and Address:  Loletha Grayer, MD  Relative Name and Phone Number:  Sharlette Dense    Current Level of Care: Hospital Recommended Level of Care: [place] Prior Approval Number:    Date Approved/Denied:   PASRR Number: [id number]  Discharge Plan: SNF    Current Diagnoses: Patient Active Problem List   Diagnosis Date Noted  . Hypotension   . Impaired fasting glucose   . Closed hip fracture requiring operative repair with routine healing, left 01/27/2021  . Fall at home, initial encounter 01/27/2021  . Overweight (BMI 25.0-29.9) 04/03/2020  . S/P right TKA 04/02/2020  . Status post total knee replacement, right 04/02/2020  . Pain in right knee 02/23/2020  . Senile purpura ([place]) 12/30/2018  . S/P vaginal hysterectomy 05/30/2018  . Recurrent major depressive disorder, in full remission ([place]) 03/02/2018  . GERD (gastroesophageal reflux disease) 10/20/2016  . Anxiety, generalized 01/28/2016  . Osteoarthritis of right knee 12/25/2015  . Depression 09/05/2015  . Abnormal brain CT 06/20/2015  . Anxiety, mild 06/20/2015  . Brain lesion 06/20/2015  . Clinical depression 06/20/2015  . Dyslipidemia 06/20/2015  . Vertigo 06/20/2015  . Hyperlipidemia 06/20/2015  . Benign hypertension 06/20/2015  . Affective disorder, major 06/20/2015  . Arthralgia of shoulder 06/20/2015  . Glaucoma 06/20/2015  . Post-menopausal 06/04/2015  . Bilateral leg weakness 06/04/2015    Orientation RESPIRATION BLADDER Height & Weight     Self,Time,Situation,Place  Normal Continent Weight:  64.9 kg Height:  5' 2.5" (158.8 cm)  BEHAVIORAL SYMPTOMS/MOOD NEUROLOGICAL BOWEL NUTRITION STATUS      Continent Diet (regular)  AMBULATORY STATUS COMMUNICATION OF NEEDS Skin   Extensive Assist Verbally Surgical wounds                       Personal Care Assistance Level of Assistance  Bathing,Dressing Bathing Assistance: Limited assistance   Dressing Assistance: Limited assistance     Functional Limitations Info             SPECIAL CARE FACTORS FREQUENCY  PT (By licensed PT)     PT Frequency: 5 times a week              Contractures Contractures Info: Not present    Additional Factors Info  Code Status,Allergies Code Status Info: Full code Allergies Info: Codeine, Combigan Brimonidine Tartrate-timolol, Sulfa Antibiotics, Metronidazole           Current Medications (01/30/2021):  This is the current hospital active medication list Current Facility-Administered Medications  Medication Dose Route Frequency Provider Last Rate Last Admin  . 0.9 %  sodium chloride infusion   Intravenous Continuous Hessie Knows, MD   Stopped at 01/29/21 0801  . acetaminophen (TYLENOL) tablet 650 mg  650 mg Oral Q6H PRN Hessie Knows, MD   650 mg at 01/30/21 0023  . alum & mag hydroxide-simeth (MAALOX/MYLANTA) 200-200-20 MG/5ML suspension 30 mL  30 mL Oral Q4H PRN Hessie Knows, MD      . bisacodyl (DULCOLAX) suppository 10 mg  10 mg Rectal Daily PRN Hessie Knows, MD      . brimonidine (ALPHAGAN) 0.15 %  ophthalmic solution 1 drop  1 drop Both Eyes TID Hessie Knows, MD   1 drop at 01/29/21 2320  . diphenhydrAMINE (BENADRYL) 12.5 MG/5ML elixir 12.5-25 mg  12.5-25 mg Oral Q4H PRN Hessie Knows, MD      . docusate sodium (COLACE) capsule 100 mg  100 mg Oral BID Hessie Knows, MD   100 mg at 01/30/21 0835  . enoxaparin (LOVENOX) injection 40 mg  40 mg Subcutaneous Q24H Hessie Knows, MD   40 mg at 01/30/21 0752  . famotidine (PEPCID) tablet 20 mg  20 mg Oral Daily PRN Hessie Knows,  MD      . fluticasone [facility]) 50 MCG/ACT nasal spray 2 spray  2 spray Each Nare Daily Hessie Knows, MD      . hydrALAZINE (APRESOLINE) injection 5 mg  5 mg Intravenous Q2H PRN Hessie Knows, MD      . latanoprost (XALATAN) 0.005 % ophthalmic solution 1 drop  1 drop Both Eyes QHS Hessie Knows, MD   1 drop at 01/29/21 2320  . magnesium citrate solution 1 Bottle  1 Bottle Oral Once PRN Hessie Knows, MD      . magnesium hydroxide (MILK OF MAGNESIA) suspension 30 mL  30 mL Oral Daily PRN Hessie Knows, MD      . melatonin tablet 5 mg  5 mg Oral QHS Hessie Knows, MD   5 mg at 01/29/21 2319  . menthol-cetylpyridinium (CEPACOL) lozenge 3 mg  1 lozenge Oral PRN Hessie Knows, MD       Or  . phenol (CHLORASEPTIC) mouth spray 1 spray  1 spray Mouth/Throat PRN Hessie Knows, MD      . methocarbamol (ROBAXIN) tablet 500 mg  500 mg Oral Q6H PRN Hessie Knows, MD   500 mg at 01/30/21 0751   Or  . methocarbamol (ROBAXIN) 500 mg in dextrose 5 % 50 mL IVPB  500 mg Intravenous Q6H PRN Hessie Knows, MD      . metoCLOPramide (REGLAN) tablet 5-10 mg  5-10 mg Oral Q8H PRN Hessie Knows, MD       Or  . metoCLOPramide (REGLAN) injection 5-10 mg  5-10 mg Intravenous Q8H PRN Hessie Knows, MD      . ondansetron [facility]) tablet 4 mg  4 mg Oral Q6H PRN Hessie Knows, MD       Or  . ondansetron [facility]) injection 4 mg  4 mg Intravenous Q6H PRN Hessie Knows, MD      . oxyCODONE-acetaminophen (PERCOCET/ROXICET) 5-325 MG per tablet 1 tablet  1 tablet Oral Q4H PRN Hessie Knows, MD   1 tablet at 01/30/21 0751  . pantoprazole (PROTONIX) EC tablet 40 mg  40 mg Oral Daily Hessie Knows, MD   40 mg at 01/30/21 0835  . pravastatin (PRAVACHOL) tablet 20 mg  20 mg Oral Daily Hessie Knows, MD   20 mg at 01/30/21 0835  . senna-docusate (Senokot-S) tablet 1 tablet  1 tablet Oral QHS PRN Hessie Knows, MD   1 tablet at 01/29/21 2319  . zolpidem (AMBIEN) tablet 5 mg  5 mg Oral QHS PRN Hessie Knows, MD   5 mg at 01/29/21 2319      Discharge Medications: Please see discharge summary for a list of discharge medications.  Relevant Imaging Results:  Relevant Lab Results:   Additional Information SS# [id number], fully vaccinated  Su Hilt, RN

## 2021-01-30 NOTE — Progress Notes (Signed)
Physical Therapy Treatment Patient Details Name: [name] MRN: [id number] DOB: Apr 29, 1941 Today's Date: 01/30/2021    History of Present Illness s/p L total hip arthroplasty anterior approach following a mechanical fall resulting in a left hip fracture w/ displaced femoral neck    PT Comments    Pt received in Semi-Fowler's position and agreeable to therapy.  Pt continues to have difficulty with SLR and marches in seated position and in standing.  Pt has issues with recruitment of hip flexors that continues to be problematic area for patient.  Pt continues to do well with ambulation and requires fewer rest breaks as therapy progresses, but still requires assistance and verbal cuing at times when ambulating down the hall due to lateral movement towards the walls.  Pt transferred back to room and performed self-care in the bathroom.  Pt then transferred back to bed and all needs met.  Current discharge plans to SNF remain appropriate at this time.  Pt will continue to benefit from skilled therapy in order to address deficits listed below.     Follow Up Recommendations  SNF     Equipment Recommendations  Rolling walker with 5" wheels;3in1 (PT)    Recommendations for Other Services       Precautions / Restrictions Precautions Precautions: Anterior Hip;Fall Restrictions Weight Bearing Restrictions: Yes LLE Weight Bearing: Weight bearing as tolerated    Mobility  Bed Mobility Overal bed mobility: Needs Assistance Bed Mobility: Supine to Sit;Sit to Supine     Supine to sit: Supervision Sit to supine: Min assist   General bed mobility comments: Utilized minA for use LLE navigation into bed from walking.    Transfers Overall transfer level: Modified independent Equipment used: Rolling walker (2 wheeled) Transfers: Sit to/from Stand Sit to Stand: Min guard;Supervision         General transfer comment: Pt able to transfer with CGA/Supervision for safety.  Pt  required 2 standing rest breaks today.  Ambulation/Gait Ambulation/Gait assistance: Min guard Gait Distance (Feet): 320 Feet Assistive device: Rolling walker (2 wheeled) Gait Pattern/deviations: Step-to pattern;Decreased step length - right;Decreased stance time - left;Decreased stride length Gait velocity: decreased   General Gait Details: Pt able to ambulate around the nursing station, and performed stair training in therapy gym.   Stairs             Wheelchair Mobility    Modified Rankin (Stroke Patients Only)       Balance Overall balance assessment: Needs assistance Sitting-balance support: Bilateral upper extremity supported Sitting balance-Leahy Scale: Fair Sitting balance - Comments: Decreased lateral lean today.   Standing balance support: Bilateral upper extremity supported Standing balance-Leahy Scale: Fair Standing balance comment: Pt able to demonstrate good weight displacement of LE's and good use of the UE's.                            Cognition Arousal/Alertness: Awake/alert Behavior During Therapy: WFL for tasks assessed/performed Overall Cognitive Status: Within Functional Limits for tasks assessed                                        Exercises      General Comments        Pertinent Vitals/Pain Pain Assessment: 0-10 Pain Score: 7  Pain Location: L hip Pain Descriptors / Indicators: Grimacing;Guarding;Aching Pain Intervention(s): Limited activity within patient's tolerance;Monitored  during session;Repositioned    Home Living                      Prior Function            PT Goals (current goals can now be found in the care plan section) Acute Rehab PT Goals Patient Stated Goal: to go to short-term rehab facility and get stronger. PT Goal Formulation: With patient/family Time For Goal Achievement: 02/12/21 Potential to Achieve Goals: Good Progress towards PT goals: Progressing toward goals     Frequency    BID      PT Plan      Co-evaluation              AM-PAC PT "6 Clicks" Mobility   Outcome Measure  Help needed turning from your back to your side while in a flat bed without using bedrails?: A Lot Help needed moving from lying on your back to sitting on the side of a flat bed without using bedrails?: A Lot Help needed moving to and from a bed to a chair (including a wheelchair)?: A Little Help needed standing up from a chair using your arms (e.g., wheelchair or bedside chair)?: A Little Help needed to walk in hospital room?: A Little Help needed climbing 3-5 steps with a railing? : A Little 6 Click Score: 16    End of Session   Activity Tolerance: Patient tolerated treatment well Patient left: in bed;with call bell/phone within reach;with bed alarm set;with family/visitor present Nurse Communication: Mobility status PT Visit Diagnosis: Unsteadiness on feet (R26.81);Other abnormalities of gait and mobility (R26.89);Muscle weakness (generalized) (M62.81);Difficulty in walking, not elsewhere classified (R26.2);Pain Pain - Right/Left: Left Pain - part of body: Hip     Time: 0086-7619 PT Time Calculation (min) (ACUTE ONLY): 45 min  Charges:  $Gait Training: 23-37 mins $Therapeutic Exercise: 8-22 mins                     Gwenlyn Saran, PT, DPT 01/30/21, 5:27 PM

## 2021-01-30 NOTE — Progress Notes (Signed)
[name], female   DOB: 06-08-1941, 80 y.o.   MRN: [id number] [facility] PROGRESS NOTE  [name] VOJ:[id number] DOB: January 13, 1941 DOA: 01/27/2021 PCP: Catherine Sizer, MD  HPI/Subjective: Patient felt well.  Still has some pain 7 out of 10 and her hip.  She felt that she did pretty good with physical therapy yesterday.  Objective: Vitals:   01/30/21 0749 01/30/21 1236  BP: (!) 126/55 (!) 127/54  Pulse: 92 90  Resp: 18 18  Temp: 98.4 F (36.9 C) 98.1 F (36.7 C)  SpO2: 96% 94%    Intake/Output Summary (Last 24 hours) at 01/30/2021 1448 Last data filed at 01/30/2021 0356 Gross per 24 hour  Intake 367.41 ml  Output -  Net 367.41 ml   Filed Weights   01/27/21 0747 01/28/21 1150  Weight: 64.9 kg 64.9 kg    ROS: Review of Systems  Respiratory: Negative for shortness of breath.   Cardiovascular: Negative for chest pain.  Gastrointestinal: Negative for abdominal pain, nausea and vomiting.   Exam: Physical Exam HENT:     Head: Normocephalic.     Mouth/Throat:     Pharynx: No oropharyngeal exudate.  Eyes:     General: Lids are normal.     Conjunctiva/sclera: Conjunctivae normal.  Cardiovascular:     Rate and Rhythm: Normal rate and regular rhythm.     Heart sounds: Normal heart sounds, S1 normal and S2 normal.  Pulmonary:     Breath sounds: Normal breath sounds. No decreased breath sounds, wheezing, rhonchi or rales.  Abdominal:     Palpations: Abdomen is soft.     Tenderness: There is no abdominal tenderness.  Musculoskeletal:     Right ankle: No swelling.     Left ankle: No swelling.  Skin:    General: Skin is warm.     Findings: No rash.  Neurological:     Mental Status: She is alert.     Comments: Unable to straight leg raise with her left leg.  Able to straight leg raise her right leg.       Data Reviewed: Basic Metabolic Panel: Recent Labs  Lab 01/27/21 0746 01/28/21 0443 01/29/21 0539 01/30/21 0547  NA 140 137  140 138  K 3.5 3.9 3.6 3.4*  CL 107 102 106 104  CO2 26 26 28 27   GLUCOSE 102* 116* 114* 109*  BUN 14 14 15 18   CREATININE 0.69 0.67 0.73 0.73  CALCIUM 9.1 9.1 8.1* 8.2*   CBC: Recent Labs  Lab 01/27/21 0746 01/28/21 0443 01/29/21 0539 01/30/21 0547  WBC 5.4 9.5 8.3 6.9  HGB 13.3 13.3 10.7* 9.6*  HCT 40.5 39.9 32.0* 29.2*  MCV 87.1 85.8 87.0 87.7  PLT 196 210 169 140*     Recent Results (from the past 240 hour(s))  Resp Panel by RT-PCR (Flu A&B, Covid) Nasopharyngeal Swab     Status: None   Collection Time: 01/27/21  7:46 AM   Specimen: Nasopharyngeal Swab; Nasopharyngeal(NP) swabs in vial transport medium  Result Value Ref Range Status   SARS Coronavirus 2 by RT PCR NEGATIVE NEGATIVE Final    Comment: (NOTE) SARS-CoV-2 target nucleic acids are NOT DETECTED.  The SARS-CoV-2 RNA is generally detectable in upper respiratory specimens during the acute phase of infection. The lowest concentration of SARS-CoV-2 viral copies this assay can detect is 138 copies/mL. A negative result does not preclude SARS-Cov-2 infection and should not be used as the sole basis for treatment or other patient management  decisions. A negative result may occur with  improper specimen collection/handling, submission of specimen other than nasopharyngeal swab, presence of viral mutation(s) within the areas targeted by this assay, and inadequate number of viral copies(<138 copies/mL). A negative result must be combined with clinical observations, patient history, and epidemiological information. The expected result is Negative.  Fact Sheet for Patients:  EntrepreneurPulse.com.au  Fact Sheet for Healthcare Providers:  IncredibleEmployment.be  This test is no t yet approved or cleared by the [place] FDA and  has been authorized for detection and/or diagnosis of SARS-CoV-2 by FDA under an Emergency Use Authorization (EUA). This EUA will remain  in effect  (meaning this test can be used) for the duration of the COVID-19 declaration under Section 564(b)(1) of the Act, 21 U.S.C.section 360bbb-3(b)(1), unless the authorization is terminated  or revoked sooner.       Influenza A by PCR NEGATIVE NEGATIVE Final   Influenza B by PCR NEGATIVE NEGATIVE Final    Comment: (NOTE) The Xpert Xpress SARS-CoV-2/FLU/RSV plus assay is intended as an aid in the diagnosis of influenza from Nasopharyngeal swab specimens and should not be used as a sole basis for treatment. Nasal washings and aspirates are unacceptable for Xpert Xpress SARS-CoV-2/FLU/RSV testing.  Fact Sheet for Patients: EntrepreneurPulse.com.au  Fact Sheet for Healthcare Providers: IncredibleEmployment.be  This test is not yet approved or cleared by the [place] FDA and has been authorized for detection and/or diagnosis of SARS-CoV-2 by FDA under an Emergency Use Authorization (EUA). This EUA will remain in effect (meaning this test can be used) for the duration of the COVID-19 declaration under Section 564(b)(1) of the Act, 21 U.S.C. section 360bbb-3(b)(1), unless the authorization is terminated or revoked.  Performed at [facility], [address] [place], [place] [address]      Studies: [place] HIP [facility] OR W/O PELVIS 2-3 VIEWS LEFT  Result Date: 01/28/2021 CLINICAL DATA:  Intraoperative imaging for left hip replacement. The patient suffered a left hip fracture in a fall 01/27/2021. EXAM: OPERATIVE LEFT HIP (WITH PELVIS IF PERFORMED) 2 VIEWS TECHNIQUE: Fluoroscopic spot image(s) were submitted for interpretation post-operatively. COMPARISON:  Plain films left hip 01/27/2021. FINDINGS: Two fluoroscopic spot views are provided. The first image demonstrates the patient's femoral neck fracture. On the second image, a left hip arthroplasty is in place. The femoral component is incompletely visualized. No acute abnormality is seen.  IMPRESSION: Intraoperative imaging for left hip replacement. No acute abnormality. Electronically Signed   By: [name] M.D.   On: 01/28/2021 15:00    Scheduled Meds: . brimonidine  1 drop Both Eyes TID  . docusate sodium  100 mg Oral BID  . enoxaparin (LOVENOX) injection  40 mg Subcutaneous Q24H  . fluticasone  2 spray Each Nare Daily  . latanoprost  1 drop Both Eyes QHS  . melatonin  5 mg Oral QHS  . pantoprazole  40 mg Oral Daily  . pravastatin  20 mg Oral Daily   Continuous Infusions: . sodium chloride Stopped (01/29/21 0801)  . methocarbamol (ROBAXIN) IV      Assessment/Plan:   1. Left hip fracture requiring operative repair by Dr. Rudene Christians on 01/28/2021.  Had left hip total arthroplasty.  Anticipate discharge to rehab tomorrow.  COVID-19 swab ordered for today. 2. Relative hypotension.  Continue to hold Cozaar. 3. Hyperlipidemia unspecified on pravastatin 4. GERD.  Continue Protonix 5. Glaucoma unspecified.  Continue brimonidine and latanoprost 6. Hypokalemia replace potassium today 7. Impaired fasting glucose.  Code Status:     Code Status Orders  (From admission, onward)         Start     Ordered   01/27/21 1109  Full code  Continuous        01/27/21 1108        Code Status History    Date Active Date Inactive Code Status Order ID Comments User Context   04/02/2020 1159 04/03/2020 2104 Full Code [id number]  Rudi Coco Inpatient   05/30/2018 1107 05/31/2018 2114 Full Code [id number]  Louretta Shorten, MD Inpatient   Advance Care Planning Activity     Family Communication: Spoke with family yesterday Disposition Plan: Status is: Inpatient  Dispo: The patient is from: Home              Anticipated d/c is to: Rehab              Patient currently postoperative day 2 anticipate discharge on postoperative day 3 to rehab   Difficult to place patient.  No.  Consultants:  Orthopedic surgery  Procedures:  Left total hip arthroplasty  Time  spent: 27 minutes  [place]

## 2021-01-30 NOTE — Progress Notes (Signed)
Subjective: 2 Days Post-Op Procedure(s) (LRB): TOTAL HIP ARTHROPLASTY ANTERIOR APPROACH (Left) Patient reports pain as mild.   Patient is well, and has had no acute complaints or problems Denies any CP, SOB, ABD pain. We will continue therapy today.   Objective: Vital signs in last 24 hours: Temp:  [98.1 F (36.7 C)-101.1 F (38.4 C)] 98.4 F (36.9 C) (03/17 0749) Pulse Rate:  [86-98] 92 (03/17 0749) Resp:  [16-18] 18 (03/17 0749) BP: (95-126)/(45-71) 126/55 (03/17 0749) SpO2:  [92 %-96 %] 96 % (03/17 0749)  Intake/Output from previous day: 03/16 0701 - 03/17 0700 In: 727.4 [P.O.:360; I.V.:367.4] Out: -  Intake/Output this shift: No intake/output data recorded.  Recent Labs    01/28/21 0443 01/29/21 0539 01/30/21 0547  HGB 13.3 10.7* 9.6*   Recent Labs    01/29/21 0539 01/30/21 0547  WBC 8.3 6.9  RBC 3.68* 3.33*  HCT 32.0* 29.2*  PLT 169 140*   Recent Labs    01/29/21 0539 01/30/21 0547  NA 140 138  K 3.6 3.4*  CL 106 104  CO2 28 27  BUN 15 18  CREATININE 0.73 0.73  GLUCOSE 114* 109*  CALCIUM 8.1* 8.2*   Recent Labs    01/28/21 0443  INR 1.0    EXAM General - Patient is Alert, Appropriate and Oriented Extremity - Neurovascular intact Sensation intact distally Intact pulses distally Dorsiflexion/Plantar flexion intact No cellulitis present Compartment soft Dressing - dressing C/D/I and no drainage, prevena intact with out drainage Motor Function - intact, moving foot and toes well on exam.   Past Medical History:  Diagnosis Date  . Anemia   . Anxiety    no meds  . Arthritis    hands, knees - no meds  . Bilateral leg weakness   . Complication of anesthesia    Difficult to arouse  . Dental bridge present    perm lower front bottom bridge  . Depression    no meds  . Eyes swollen    and red - was seen by MD for possible allergy to new eye drops  . GERD (gastroesophageal reflux disease)    diet controlled - only takes med prn  basis  . Glaucoma   . Hypercholesteremia   . Hypertension   . SVD (spontaneous vaginal delivery)    x 3 - only one currently living  . Vertigo     Assessment/Plan:   2 Days Post-Op Procedure(s) (LRB): TOTAL HIP ARTHROPLASTY ANTERIOR APPROACH (Left) Principal Problem:   Closed hip fracture requiring operative repair with routine healing, left Active Problems:   Dyslipidemia   Hyperlipidemia   GERD (gastroesophageal reflux disease)   Fall at home, initial encounter   Hypotension   Impaired fasting glucose  Estimated body mass index is 25.75 kg/m as calculated from the following:   Height as of this encounter: 5' 2.5" (1.588 m).   Weight as of this encounter: 64.9 kg. Advance diet Up with therapy  Work on BM Acute post op blood loss anemia - Hgb 9.6, stable  Fever - resolved. Continue with incentive spirometer Pain controlled VSS Cm to assist with discharge to SNF  Follow up with [facility]opedics in 2 weeks Lovenox 40 mg SQ daily x 14 days at discharge Please remove provena negative pressure dressing on 02/07/2021 and apply honey comb dressing. Keep dressing clean and dry at all times. Rx for lovenox and oxycodone in chart  DVT Prophylaxis - Lovenox, TED hose and SCDs Weight-Bearing as tolerated to left  leg   T. Rachelle Hora, PA-C [place] 01/30/2021, 10:36 AM

## 2021-01-30 NOTE — Progress Notes (Signed)
Physical Therapy Treatment Patient Details Name: [name] MRN: [id number] DOB: [birth year] Today's Date: 01/30/2021    History of Present Illness s/p L total hip arthroplasty anterior approach following a mechanical fall resulting in a left hip fracture w/ displaced femoral neck    PT Comments    Pt received in Semi-Fowler's position and agreeable to therapy.  Pt notes that she has decreased pain upon arrival to the room.  Pt requiring less assistance from therapist for leg navigation, however the pt is still unable to perform a SLR.  Pt then transferred to standing with FWW and ambulated 320 ft around the nursing station x2.  Pt returned to recliner to sit upright for lunch.  Pt will be seen this PM for continued therapy.  Current discharge plans to SNF remain appropriate at this time.  Pt will continue to benefit from skilled therapy in order to address deficits listed below.    Follow Up Recommendations  SNF     Equipment Recommendations  Rolling walker with 5" wheels;3in1 (PT)    Recommendations for Other Services       Precautions / Restrictions Precautions Precautions: Anterior Hip;Fall Restrictions Weight Bearing Restrictions: Yes LLE Weight Bearing: Weight bearing as tolerated    Mobility  Bed Mobility Overal bed mobility: Needs Assistance Bed Mobility: Supine to Sit;Sit to Supine     Supine to sit: Min assist Sit to supine: Min assist   General bed mobility comments: Utilized minA for use LLE navigation into bed    Transfers Overall transfer level: Modified independent Equipment used: Rolling walker (2 wheeled) Transfers: Sit to/from Stand Sit to Stand: Min guard;Supervision         General transfer comment: Pt able to transfer with CGA/Supervision for safety.  Pt required 2 standing rest breaks today.  Ambulation/Gait Ambulation/Gait assistance: Min guard Gait Distance (Feet): 320 Feet Assistive device: Rolling walker (2 wheeled) Gait  Pattern/deviations: Step-to pattern;Decreased step length - right;Decreased stance time - left;Decreased stride length Gait velocity: decreased   General Gait Details: Pt able to ambulate around the nursing station, and performed stair training in therapy gym.   Stairs             Wheelchair Mobility    Modified Rankin (Stroke Patients Only)       Balance Overall balance assessment: Needs assistance Sitting-balance support: Bilateral upper extremity supported Sitting balance-Leahy Scale: Fair Sitting balance - Comments: R Lateral lean due to pain on L side Postural control: Right lateral lean Standing balance support: Bilateral upper extremity supported Standing balance-Leahy Scale: Fair Standing balance comment: Pt able to demonstrate good weight displacement of LE's and good use of the UE's.                            Cognition Arousal/Alertness: Awake/alert Behavior During Therapy: WFL for tasks assessed/performed Overall Cognitive Status: Within Functional Limits for tasks assessed                                        Exercises      General Comments        Pertinent Vitals/Pain Pain Assessment: 0-10 Pain Score: 7  Pain Location: L hip Pain Descriptors / Indicators: Grimacing;Guarding;Aching Pain Intervention(s): Limited activity within patient's tolerance;Monitored during session;Premedicated before session;Repositioned    Home Living  Prior Function            PT Goals (current goals can now be found in the care plan section) Acute Rehab PT Goals Patient Stated Goal: to go to short-term rehab facility and get stronger. PT Goal Formulation: With patient/family Time For Goal Achievement: 02/12/21 Potential to Achieve Goals: Good Progress towards PT goals: Progressing toward goals    Frequency    BID      PT Plan      Co-evaluation              AM-PAC PT "6 Clicks"  Mobility   Outcome Measure  Help needed turning from your back to your side while in a flat bed without using bedrails?: A Lot Help needed moving from lying on your back to sitting on the side of a flat bed without using bedrails?: A Lot Help needed moving to and from a bed to a chair (including a wheelchair)?: A Little Help needed standing up from a chair using your arms (e.g., wheelchair or bedside chair)?: A Little Help needed to walk in hospital room?: A Little Help needed climbing 3-5 steps with a railing? : A Little 6 Click Score: 16    End of Session Equipment Utilized During Treatment: Gait belt Activity Tolerance: Patient tolerated treatment well Patient left: in bed;with call bell/phone within reach;with bed alarm set;with family/visitor present Nurse Communication: Mobility status PT Visit Diagnosis: Unsteadiness on feet (R26.81);Other abnormalities of gait and mobility (R26.89);Muscle weakness (generalized) (M62.81);Difficulty in walking, not elsewhere classified (R26.2);Pain Pain - Right/Left: Left Pain - part of body: Hip     Time: 4827-0786 PT Time Calculation (min) (ACUTE ONLY): 32 min  Charges:  $Gait Training: 23-37 mins                      Gwenlyn Saran, PT, DPT 01/30/21, 1:38 PM

## 2021-01-30 NOTE — TOC Progression Note (Signed)
Transition of Care [facility]) - Progression Note    Patient Details  Name: [name] MRN: [id number] Date of Birth: Sep 12, 1941  Transition of Care [facility]) CM/SW [place], RN Phone Number: 01/30/2021, 11:56 AM  Clinical Narrative:     CM reviewed the bed offers with the patient, she has accepted the bed offer from [facility] in [place], [facility] started       Expected Discharge Plan and Services                                                 Social Determinants of Health (SDOH) Interventions    Readmission Risk Interventions No flowsheet data found.

## 2021-01-31 DIAGNOSIS — Z79899 Other long term (current) drug therapy: Secondary | ICD-10-CM | POA: Diagnosis not present

## 2021-01-31 DIAGNOSIS — R5381 Other malaise: Secondary | ICD-10-CM | POA: Diagnosis not present

## 2021-01-31 DIAGNOSIS — S72002D Fracture of unspecified part of neck of left femur, subsequent encounter for closed fracture with routine healing: Secondary | ICD-10-CM | POA: Diagnosis not present

## 2021-01-31 DIAGNOSIS — H409 Unspecified glaucoma: Secondary | ICD-10-CM | POA: Diagnosis not present

## 2021-01-31 DIAGNOSIS — K219 Gastro-esophageal reflux disease without esophagitis: Secondary | ICD-10-CM | POA: Diagnosis not present

## 2021-01-31 DIAGNOSIS — R279 Unspecified lack of coordination: Secondary | ICD-10-CM | POA: Diagnosis not present

## 2021-01-31 DIAGNOSIS — W19XXXA Unspecified fall, initial encounter: Secondary | ICD-10-CM | POA: Diagnosis not present

## 2021-01-31 DIAGNOSIS — I959 Hypotension, unspecified: Secondary | ICD-10-CM | POA: Diagnosis not present

## 2021-01-31 DIAGNOSIS — S72002A Fracture of unspecified part of neck of left femur, initial encounter for closed fracture: Secondary | ICD-10-CM | POA: Diagnosis not present

## 2021-01-31 DIAGNOSIS — N390 Urinary tract infection, site not specified: Secondary | ICD-10-CM | POA: Diagnosis not present

## 2021-01-31 DIAGNOSIS — R7301 Impaired fasting glucose: Secondary | ICD-10-CM | POA: Diagnosis not present

## 2021-01-31 DIAGNOSIS — M6281 Muscle weakness (generalized): Secondary | ICD-10-CM | POA: Diagnosis not present

## 2021-01-31 DIAGNOSIS — E559 Vitamin D deficiency, unspecified: Secondary | ICD-10-CM | POA: Diagnosis not present

## 2021-01-31 DIAGNOSIS — E785 Hyperlipidemia, unspecified: Secondary | ICD-10-CM | POA: Diagnosis not present

## 2021-01-31 DIAGNOSIS — D649 Anemia, unspecified: Secondary | ICD-10-CM | POA: Diagnosis not present

## 2021-01-31 DIAGNOSIS — Z20822 Contact with and (suspected) exposure to covid-19: Secondary | ICD-10-CM | POA: Diagnosis not present

## 2021-01-31 DIAGNOSIS — E876 Hypokalemia: Secondary | ICD-10-CM | POA: Diagnosis not present

## 2021-01-31 DIAGNOSIS — E039 Hypothyroidism, unspecified: Secondary | ICD-10-CM | POA: Diagnosis not present

## 2021-01-31 LAB — CBC
HCT: 29.1 % — ABNORMAL LOW (ref 36.0–46.0)
Hemoglobin: 9.7 g/dL — ABNORMAL LOW (ref 12.0–15.0)
MCH: 29 pg (ref 26.0–34.0)
MCHC: 33.3 g/dL (ref 30.0–36.0)
MCV: 87.1 fL (ref 80.0–100.0)
Platelets: 175 10*3/uL (ref 150–400)
RBC: 3.34 MIL/uL — ABNORMAL LOW (ref 3.87–5.11)
RDW: 13.7 % (ref 11.5–15.5)
WBC: 7.5 10*3/uL (ref 4.0–10.5)
nRBC: 0 % (ref 0.0–0.2)

## 2021-01-31 LAB — BASIC METABOLIC PANEL
Anion gap: 4 — ABNORMAL LOW (ref 5–15)
BUN: 16 mg/dL (ref 8–23)
CO2: 28 mmol/L (ref 22–32)
Calcium: 8.1 mg/dL — ABNORMAL LOW (ref 8.9–10.3)
Chloride: 104 mmol/L (ref 98–111)
Creatinine, Ser: 0.62 mg/dL (ref 0.44–1.00)
GFR, Estimated: >60 mL/min (ref >60)
Glucose, Bld: 104 mg/dL — ABNORMAL HIGH (ref 70–99)
Potassium: 3.9 mmol/L (ref 3.5–5.1)
Sodium: 136 mmol/L (ref 135–145)

## 2021-01-31 LAB — SURGICAL PATHOLOGY

## 2021-01-31 MED ORDER — OSCAL 500/200 D-3 500-200 MG-UNIT PO TABS: 1.0000 | ORAL_TABLET | Freq: Two times a day (BID) | ORAL | 0 refills | Status: DC

## 2021-01-31 MED ORDER — BISACODYL 10 MG RE SUPP
10.0000 mg | Freq: Once | RECTAL | Status: AC
  Administered 2021-01-31: 10 mg via RECTAL
  Filled 2021-01-31: qty 1

## 2021-01-31 MED ORDER — DOCUSATE SODIUM 100 MG PO CAPS: 100.0000 mg | ORAL_CAPSULE | Freq: Two times a day (BID) | ORAL | 0 refills | Status: DC | PRN

## 2021-01-31 MED ORDER — MELATONIN 5 MG PO TABS: 5.0000 mg | ORAL_TABLET | Freq: Every day | ORAL | 0 refills | Status: DC

## 2021-01-31 MED ORDER — IBANDRONATE SODIUM 150 MG PO TABS: 150.0000 mg | ORAL_TABLET | ORAL | 11 refills | Status: DC

## 2021-01-31 MED ORDER — ACETAMINOPHEN 325 MG PO TABS: 650.0000 mg | ORAL_TABLET | Freq: Four times a day (QID) | ORAL | Status: DC | PRN

## 2021-01-31 MED ORDER — POLYETHYLENE GLYCOL 3350 17 G PO PACK: 17.0000 g | PACK | Freq: Every day | ORAL | 0 refills | Status: DC | PRN

## 2021-01-31 MED ORDER — TRAZODONE HCL 50 MG PO TABS: 50.0000 mg | ORAL_TABLET | Freq: Every evening | ORAL | 0 refills | Status: DC | PRN

## 2021-01-31 NOTE — Care Management Important Message (Signed)
Important Message  Patient Details  Name: [name] MRN: [id number] Date of Birth: [birth year]   Medicare Important Message Given:  Yes  Patient no longer in isolation so was able to visit in person.  I reviewed the Important Message from Medicare and she is in agreement with her discharge.   [name] 01/31/2021, 10:40 AM

## 2021-01-31 NOTE — Discharge Planning (Signed)
Patient IV removed.  Given PO pain meds and zofran for transport.  DC papers printed and placed in SNF packet.  Per report from floor - floor RN called [facility] and get report.  EMS here to transport to rm 205 and family in room at DC.

## 2021-01-31 NOTE — Plan of Care (Signed)
  Problem: Pain Managment: Goal: General experience of comfort will improve Outcome: Progressing   Problem: Safety: Goal: Ability to remain free from injury will improve Outcome: Progressing   Problem: Skin Integrity: Goal: Risk for impaired skin integrity will decrease Outcome: Progressing   Problem: Activity: Goal: Ability to ambulate and perform ADLs will improve Outcome: Progressing   Problem: Self-Concept: Goal: Ability to maintain and perform role responsibilities to the fullest extent possible will improve Outcome: Progressing

## 2021-01-31 NOTE — Care Management Important Message (Signed)
Important Message  Patient Details  Name: [name] MRN: [id number] Date of Birth: 10/18/1941   Medicare Important Message Given:  Other (see comment)  Patient is in an isolation room so I called the patient's room but no answer.  Will try again.   Juliann Pulse A Jaycub Noorani 01/31/2021, 9:46 AM

## 2021-01-31 NOTE — Discharge Summary (Signed)
[facility] [facility] [place] NAME: [name]    MR#:  [id number]  DATE OF BIRTH:  1941/10/03  DATE OF ADMISSION:  01/27/2021 ADMITTING PHYSICIAN: Ivor Costa, MD  DATE OF DISCHARGE: 01/31/2021  PRIMARY CARE PHYSICIAN: Steele Sizer, MD    ADMISSION DIAGNOSIS:  Fall [W19.XXXA] Closed left hip fracture ([place]) [S72.002A] Closed fracture of left hip, initial encounter ([place]) [S72.002A]  DISCHARGE DIAGNOSIS:  Principal Problem:   Closed hip fracture requiring operative repair with routine healing, left Active Problems:   Dyslipidemia   Hyperlipidemia   GERD (gastroesophageal reflux disease)   Fall at home, initial encounter   Hypotension   Impaired fasting glucose   Hypokalemia   SECONDARY DIAGNOSIS:   Past Medical History:  Diagnosis Date  . Anemia   . Anxiety    no meds  . Arthritis    hands, knees - no meds  . Bilateral leg weakness   . Complication of anesthesia    Difficult to arouse  . Dental bridge present    perm lower front bottom bridge  . Depression    no meds  . Eyes swollen    and red - was seen by MD for possible allergy to new eye drops  . GERD (gastroesophageal reflux disease)    diet controlled - only takes med prn basis  . Glaucoma   . Hypercholesteremia   . Hypertension   . SVD (spontaneous vaginal delivery)    x 3 - only one currently living  . Vertigo     HOSPITAL COURSE:   1.  Closed left hip fracture requiring operative repair.  Patient had a left hip total arthroplasty by Dr. Rudene Christians on 01/28/2021.  Physical therapy recommended rehab.  Weightbearing as tolerated.  Remove Praveena negative pressure dressing on 02/07/2021 and apply honeycomb dressing after that.  Follow-up with [facility] orthopedics in 2 weeks.  Orthopedics wrote scripts for Lovenox and pain medications.  Will order calcium and vitamin D and Boniva monthly as outpatient. 2.  Relative hypotension during the hospital course.  We  held her Cozaar.  If blood pressure starts to elevate can consider restarting.  But at this point I will hold off. 3.  Hyperlipidemia unspecified on pravastatin 4.  GERD on Protonix 5.  Glaucoma unspecified.  Continue brimonidine and latanoprost 6.  Hypokalemia during the hospital course this was replaced into the normal range. 7.  Postoperative anemia.  Hemoglobin dropped down from 13.3 down to 9.7 and stabilized.  Continue to monitor as outpatient 8.  Impaired fasting glucose. 9.  Constipation.  Will discharge after bowel movement today.  As needed MiraLAX and Colace at facility.  DISCHARGE CONDITIONS:   Satisfactory  CONSULTS OBTAINED:  Treatment Team:  Hessie Knows, MD  DRUG ALLERGIES:   Allergies  Allergen Reactions  . Codeine     headaches  . Combigan [Brimonidine Tartrate-Timolol]     drowsy  . Sulfa Antibiotics Nausea And Vomiting  . Metronidazole Itching and Rash    DISCHARGE MEDICATIONS:   Allergies as of 01/31/2021      Reactions   Codeine    headaches   Combigan [brimonidine Tartrate-timolol]    drowsy   Sulfa Antibiotics Nausea And Vomiting   Metronidazole Itching, Rash      Medication List    STOP taking these medications   Biotin 10000 MCG Tabs   losartan 25 MG tablet Commonly known as: COZAAR     TAKE these medications   acetaminophen 325  MG tablet Commonly known as: TYLENOL Take 2 tablets (650 mg total) by mouth every 6 (six) hours as needed for fever, headache or moderate pain.   Alphagan P 0.1 % Soln Generic drug: brimonidine Place 1 drop into both eyes 3 (three) times daily.   bimatoprost 0.01 % Soln Commonly known as: LUMIGAN Place 1 drop into both eyes at bedtime.   docusate sodium 100 MG capsule Commonly known as: COLACE Take 1 capsule (100 mg total) by mouth 2 (two) times daily as needed for mild constipation.   enoxaparin 40 MG/0.4ML injection Commonly known as: LOVENOX Inject 0.4 mLs (40 mg total) into the skin daily for  14 days.   fluticasone 50 MCG/ACT nasal spray Commonly known as: FLONASE SPRAY 2 SPRAYS INTO EACH NOSTRIL EVERY DAY What changed: See the new instructions.   ibandronate 150 MG tablet Commonly known as: Boniva Take 1 tablet (150 mg total) by mouth every 30 (thirty) days. Take in the morning with a full glass of water, on an empty stomach, and do not take anything else by mouth or lie down for the next 30 min. Start taking on: February 03, 2021   melatonin 5 MG Tabs Take 1 tablet (5 mg total) by mouth at bedtime.   Oscal 500/200 D-3 500-200 MG-UNIT tablet Generic drug: calcium-vitamin D Take 1 tablet by mouth 2 (two) times daily.   oxyCODONE-acetaminophen 5-325 MG tablet Commonly known as: PERCOCET/ROXICET Take 1 tablet by mouth every 4 (four) hours as needed for moderate pain.   polyethylene glycol 17 g packet Commonly known as: MIRALAX / GLYCOLAX Take 17 g by mouth daily as needed.   pravastatin 20 MG tablet Commonly known as: PRAVACHOL Take 1 tablet (20 mg total) by mouth daily.   Premarin vaginal cream Generic drug: conjugated estrogens Place 1 application vaginally 2 (two) times a week.   Systane Nighttime Oint Place 1 application into both eyes at bedtime as needed (dry eyes).   traZODone 50 MG tablet Commonly known as: DESYREL Take 1 tablet (50 mg total) by mouth at bedtime as needed for sleep.            Durable Medical Equipment  (From admission, onward)         Start     Ordered   01/28/21 1555  DME Walker rolling  Once       Question Answer Comment  Walker: With 5 Inch Wheels   Patient needs a walker to treat with the following condition S/P hip replacement      01/28/21 1554   01/28/21 1555  DME 3 n 1  Once        01/28/21 1554   01/28/21 1555  DME Bedside commode  Once       Question:  Patient needs a bedside commode to treat with the following condition  Answer:  S/P hip replacement   01/28/21 1554           DISCHARGE INSTRUCTIONS:    Follow-up orthopedics 2 weeks Follow-up with facility 1 day  If you experience worsening of your admission symptoms, develop shortness of breath, life threatening emergency, suicidal or homicidal thoughts you must seek medical attention immediately by calling 911 or calling your MD immediately  if symptoms less severe.  You Must read complete instructions/literature along with all the possible adverse reactions/side effects for all the Medicines you take and that have been prescribed to you. Take any new Medicines after you have completely understood and accept all the  possible adverse reactions/side effects.   Please note  You were cared for by a hospitalist during your hospital stay. If you have any questions about your discharge medications or the care you received while you were in the hospital after you are discharged, you can call the unit and asked to speak with the hospitalist on call if the hospitalist that took care of you is not available. Once you are discharged, your primary care physician will handle any further medical issues. Please note that NO REFILLS for any discharge medications will be authorized once you are discharged, as it is imperative that you return to your primary care physician (or establish a relationship with a primary care physician if you do not have one) for your aftercare needs so that they can reassess your need for medications and monitor your lab values.    Today   CHIEF COMPLAINT:   Chief Complaint  Patient presents with  . Fall    HISTORY OF PRESENT ILLNESS:  [name]  is a 80 y.o. female seen in after a fall and found to have a left hip fracture   VITAL SIGNS:  Blood pressure 116/89, pulse 90, temperature 98.7 F (37.1 C), resp. rate 18, height 5' 2.5" (1.588 m), weight 64.9 kg, SpO2 96 %.  I/O:  No intake or output data in the 24 hours ending 01/31/21 0833  PHYSICAL EXAMINATION:  GENERAL:  80 y.o.-year-old patient lying in the  bed with no acute distress.  EYES: Pupils equal, round, reactive to light and accommodation. No scleral icterus.  HEENT: Head atraumatic, normocephalic. Oropharynx and nasopharynx clear.   LUNGS: Normal breath sounds bilaterally, no wheezing, rales,rhonchi or crepitation. No use of accessory muscles of respiration.  CARDIOVASCULAR: S1, S2 normal. No murmurs, rubs, or gallops.  ABDOMEN: Soft, non-tender, non-distended.  EXTREMITIES: No pedal edema.  NEUROLOGIC: Cranial nerves II through XII are intact.  PSYCHIATRIC: The patient is alert and oriented x 3.  SKIN: No obvious rash, lesion, or ulcer.   DATA REVIEW:   CBC Recent Labs  Lab 01/31/21 0536  WBC 7.5  HGB 9.7*  HCT 29.1*  PLT 175    Chemistries  Recent Labs  Lab 01/31/21 0536  NA 136  K 3.9  CL 104  CO2 28  GLUCOSE 104*  BUN 16  CREATININE 0.62  CALCIUM 8.1*     Microbiology Results  Results for orders placed or performed during the hospital encounter of 01/27/21  Resp Panel by RT-PCR (Flu A&B, Covid) Nasopharyngeal Swab     Status: None   Collection Time: 01/27/21  7:46 AM   Specimen: Nasopharyngeal Swab; Nasopharyngeal(NP) swabs in vial transport medium  Result Value Ref Range Status   SARS Coronavirus 2 by RT PCR NEGATIVE NEGATIVE Final    Comment: (NOTE) SARS-CoV-2 target nucleic acids are NOT DETECTED.  The SARS-CoV-2 RNA is generally detectable in upper respiratory specimens during the acute phase of infection. The lowest concentration of SARS-CoV-2 viral copies this assay can detect is 138 copies/mL. A negative result does not preclude SARS-Cov-2 infection and should not be used as the sole basis for treatment or other patient management decisions. A negative result may occur with  improper specimen collection/handling, submission of specimen other than nasopharyngeal swab, presence of viral mutation(s) within the areas targeted by this assay, and inadequate number of viral copies(<138 copies/mL). A  negative result must be combined with clinical observations, patient history, and epidemiological information. The expected result is Negative.  Fact Sheet for Patients:  EntrepreneurPulse.com.au  Fact Sheet for Healthcare Providers:  IncredibleEmployment.be  This test is no t yet approved or cleared by the [place] FDA and  has been authorized for detection and/or diagnosis of SARS-CoV-2 by FDA under an Emergency Use Authorization (EUA). This EUA will remain  in effect (meaning this test can be used) for the duration of the COVID-19 declaration under Section 564(b)(1) of the Act, 21 U.S.C.section 360bbb-3(b)(1), unless the authorization is terminated  or revoked sooner.       Influenza A by PCR NEGATIVE NEGATIVE Final   Influenza B by PCR NEGATIVE NEGATIVE Final    Comment: (NOTE) The Xpert Xpress SARS-CoV-2/FLU/RSV plus assay is intended as an aid in the diagnosis of influenza from Nasopharyngeal swab specimens and should not be used as a sole basis for treatment. Nasal washings and aspirates are unacceptable for Xpert Xpress SARS-CoV-2/FLU/RSV testing.  Fact Sheet for Patients: EntrepreneurPulse.com.au  Fact Sheet for Healthcare Providers: IncredibleEmployment.be  This test is not yet approved or cleared by the [place] FDA and has been authorized for detection and/or diagnosis of SARS-CoV-2 by FDA under an Emergency Use Authorization (EUA). This EUA will remain in effect (meaning this test can be used) for the duration of the COVID-19 declaration under Section 564(b)(1) of the Act, 21 U.S.C. section 360bbb-3(b)(1), unless the authorization is terminated or revoked.  Performed at [facility], [place], [place] [address]   SARS CORONAVIRUS 2 (TAT 6-24 HRS) Nasopharyngeal Nasopharyngeal Swab     Status: None   Collection Time: 01/30/21  1:06 PM   Specimen:  Nasopharyngeal Swab  Result Value Ref Range Status   SARS Coronavirus 2 NEGATIVE NEGATIVE Final    Comment: (NOTE) SARS-CoV-2 target nucleic acids are NOT DETECTED.  The SARS-CoV-2 RNA is generally detectable in upper and lower respiratory specimens during the acute phase of infection. Negative results do not preclude SARS-CoV-2 infection, do not rule out co-infections with other pathogens, and should not be used as the sole basis for treatment or other patient management decisions. Negative results must be combined with clinical observations, patient history, and epidemiological information. The expected result is Negative.  Fact Sheet for Patients: SugarRoll.be  Fact Sheet for Healthcare Providers: https://www.woods-mathews.com/  This test is not yet approved or cleared by the [place] FDA and  has been authorized for detection and/or diagnosis of SARS-CoV-2 by FDA under an Emergency Use Authorization (EUA). This EUA will remain  in effect (meaning this test can be used) for the duration of the COVID-19 declaration under Se ction 564(b)(1) of the Act, 21 U.S.C. section 360bbb-3(b)(1), unless the authorization is terminated or revoked sooner.  Performed at [facility], [place] [address] [place], [place] [address]      Management plans discussed with the patient, family and they are in agreement.  CODE STATUS:     Code Status Orders  (From admission, onward)         Start     Ordered   01/27/21 1109  Full code  Continuous        01/27/21 1108        Code Status History    Date Active Date Inactive Code Status Order ID Comments User Context   04/02/2020 1159 04/03/2020 2104 Full Code [id number]  Rudi Coco Inpatient   05/30/2018 1107 05/31/2018 2114 Full Code [id number]  Louretta Shorten, MD Inpatient   Advance Care Planning Activity      TOTAL TIME TAKING CARE OF  THIS PATIENT: 32 minutes.    Loletha Grayer M.D on 01/31/2021 at 8:33 AM  Between 7am to 6pm - Pager - [phone]  After 6pm go to [email or web address] - password [email or web address]  [facility]  CC: Primary care physician; Steele Sizer, MD

## 2021-01-31 NOTE — Progress Notes (Signed)
Physical Therapy Treatment Patient Details Name: [name] MRN: [id number] DOB: 19-Jun-1941 Today's Date: 01/31/2021    History of Present Illness s/p L total hip arthroplasty anterior approach following a mechanical fall resulting in a left hip fracture w/ displaced femoral neck    PT Comments    Pt was long sitting in bed with supportive son at bedside. She agrees to PT session requesting to use BR prior to ambulation in halls. Pt was easily able to exit bed, stand and ambulate without LOB or difficulty. She will benefit from SNF at [place]. Pt does not have any assistance at home. Will continue to progress pt per POC.    Follow Up Recommendations  SNF     Equipment Recommendations  Other (comment) (defer to next elevel of care)       Precautions / Restrictions Precautions Precautions: Anterior Hip;Fall Restrictions Weight Bearing Restrictions: Yes LLE Weight Bearing: Weight bearing as tolerated    Mobility  Bed Mobility Overal bed mobility: Modified Independent     Transfers Overall transfer level: Modified independent     Ambulation/Gait Ambulation/Gait assistance: Supervision Gait Distance (Feet): 200 Feet Assistive device: Rolling walker (2 wheeled) Gait Pattern/deviations: Step-through pattern;Decreased step length - right Gait velocity: decreased   General Gait Details: pt was easily and safely able to ambulate 200 ft without LOB or safety concern      Balance Overall balance assessment: Needs assistance Sitting-balance support: Bilateral upper extremity supported Sitting balance-Leahy Scale: Good Sitting balance - Comments: no LOB in sitting   Standing balance support: Bilateral upper extremity supported Standing balance-Leahy Scale: Good Standing balance comment: no LOB throughout session         Cognition Arousal/Alertness: Awake/alert Behavior During Therapy: WFL for tasks assessed/performed Overall Cognitive Status: Within Functional  Limits for tasks assessed        General Comments: Pt is A and O x 4             Pertinent Vitals/Pain Pain Assessment: No/denies pain Pain Score: 0-No pain Pain Location: L hip Pain Descriptors / Indicators: Grimacing;Guarding;Aching Pain Intervention(s): Monitored during session;Limited activity within patient's tolerance;Premedicated before session;Repositioned           PT Goals (current goals can now be found in the care plan section) Acute Rehab PT Goals Patient Stated Goal: to go to short-term rehab facility and get stronger. Progress towards PT goals: Progressing toward goals    Frequency    BID      PT Plan Current plan remains appropriate       AM-PAC PT "6 Clicks" Mobility   Outcome Measure  Help needed turning from your back to your side while in a flat bed without using bedrails?: A Lot Help needed moving from lying on your back to sitting on the side of a flat bed without using bedrails?: A Lot Help needed moving to and from a bed to a chair (including a wheelchair)?: A Little Help needed standing up from a chair using your arms (e.g., wheelchair or bedside chair)?: A Little Help needed to walk in hospital room?: A Little Help needed climbing 3-5 steps with a railing? : A Little 6 Click Score: 16    End of Session Equipment Utilized During Treatment: Gait belt Activity Tolerance: Patient tolerated treatment well Patient left: in bed;with call bell/phone within reach;with bed alarm set;with family/visitor present Nurse Communication: Mobility status PT Visit Diagnosis: Unsteadiness on feet (R26.81);Other abnormalities of gait and mobility (R26.89);Muscle weakness (generalized) (M62.81);Difficulty in walking,  not elsewhere classified (R26.2);Pain Pain - Right/Left: Left Pain - part of body: Hip     Time: 4210-3128 PT Time Calculation (min) (ACUTE ONLY): 25 min  Charges:  $Gait Training: 8-22 mins $Therapeutic Activity: 8-22 mins                      Julaine Fusi PTA 01/31/21, 12:49 PM

## 2021-01-31 NOTE — Progress Notes (Signed)
Subjective: 3 Days Post-Op Procedure(s) (LRB): TOTAL HIP ARTHROPLASTY ANTERIOR APPROACH (Left) Patient reports pain as mild.   Patient is well, and has had no acute complaints or problems Denies any CP, SOB, ABD pain. We will continue therapy today.   Objective: Vital signs in last 24 hours: Temp:  [98.1 F (36.7 C)-98.7 F (37.1 C)] 98.7 F (37.1 C) (03/18 0422) Pulse Rate:  [86-94] 90 (03/18 0422) Resp:  [17-18] 18 (03/18 0422) BP: (116-128)/(54-89) 116/89 (03/18 0422) SpO2:  [92 %-99 %] 96 % (03/18 0422)  Intake/Output from previous day: No intake/output data recorded. Intake/Output this shift: No intake/output data recorded.  Recent Labs    01/29/21 0539 01/30/21 0547 01/31/21 0536  HGB 10.7* 9.6* 9.7*   Recent Labs    01/30/21 0547 01/31/21 0536  WBC 6.9 7.5  RBC 3.33* 3.34*  HCT 29.2* 29.1*  PLT 140* 175   Recent Labs    01/30/21 0547 01/31/21 0536  NA 138 136  K 3.4* 3.9  CL 104 104  CO2 27 28  BUN 18 16  CREATININE 0.73 0.62  GLUCOSE 109* 104*  CALCIUM 8.2* 8.1*   No results for input(s): LABPT, INR in the last 72 hours.  EXAM General - Patient is Alert, Appropriate and Oriented Extremity - Neurovascular intact Sensation intact distally Intact pulses distally Dorsiflexion/Plantar flexion intact No cellulitis present Compartment soft Dressing - dressing C/D/I and no drainage, prevena intact with out drainage Motor Function - intact, moving foot and toes well on exam.   Past Medical History:  Diagnosis Date  . Anemia   . Anxiety    no meds  . Arthritis    hands, knees - no meds  . Bilateral leg weakness   . Complication of anesthesia    Difficult to arouse  . Dental bridge present    perm lower front bottom bridge  . Depression    no meds  . Eyes swollen    and red - was seen by MD for possible allergy to new eye drops  . GERD (gastroesophageal reflux disease)    diet controlled - only takes med prn basis  . Glaucoma   .  Hypercholesteremia   . Hypertension   . SVD (spontaneous vaginal delivery)    x 3 - only one currently living  . Vertigo     Assessment/Plan:   3 Days Post-Op Procedure(s) (LRB): TOTAL HIP ARTHROPLASTY ANTERIOR APPROACH (Left) Principal Problem:   Closed hip fracture requiring operative repair with routine healing, left Active Problems:   Dyslipidemia   Hyperlipidemia   GERD (gastroesophageal reflux disease)   Fall at home, initial encounter   Hypotension   Impaired fasting glucose   Hypokalemia  Estimated body mass index is 25.75 kg/m as calculated from the following:   Height as of this encounter: 5' 2.5" (1.588 m).   Weight as of this encounter: 64.9 kg. Advance diet Up with therapy  Work on BM, suppository ordered Acute post op blood loss anemia - Hgb 9.7, stable  Fever - resolved. Continue with incentive spirometer Pain controlled VSS Cm to assist with discharge to SNF  Follow up with [facility] orthopedics in 2 weeks Lovenox 40 mg SQ daily x 14 days at discharge Please remove provena negative pressure dressing on 02/07/2021 and apply honey comb dressing. Keep dressing clean and dry at all times. Rx for lovenox and oxycodone in chart  DVT Prophylaxis - Lovenox, TED hose and SCDs Weight-Bearing as tolerated to left leg   T.  Rachelle Hora, PA-C [address] 01/31/2021, 8:04 AM

## 2021-02-02 DIAGNOSIS — E039 Hypothyroidism, unspecified: Secondary | ICD-10-CM | POA: Diagnosis not present

## 2021-02-02 DIAGNOSIS — K219 Gastro-esophageal reflux disease without esophagitis: Secondary | ICD-10-CM | POA: Diagnosis not present

## 2021-02-02 DIAGNOSIS — E785 Hyperlipidemia, unspecified: Secondary | ICD-10-CM | POA: Diagnosis not present

## 2021-02-02 DIAGNOSIS — S72002A Fracture of unspecified part of neck of left femur, initial encounter for closed fracture: Secondary | ICD-10-CM | POA: Diagnosis not present

## 2021-02-02 DIAGNOSIS — D649 Anemia, unspecified: Secondary | ICD-10-CM | POA: Diagnosis not present

## 2021-02-02 DIAGNOSIS — I959 Hypotension, unspecified: Secondary | ICD-10-CM | POA: Diagnosis not present

## 2021-02-02 DIAGNOSIS — W19XXXA Unspecified fall, initial encounter: Secondary | ICD-10-CM | POA: Diagnosis not present

## 2021-02-06 ENCOUNTER — Encounter: Payer: Self-pay | Admitting: Orthopedic Surgery

## 2021-02-11 DIAGNOSIS — F411 Generalized anxiety disorder: Secondary | ICD-10-CM | POA: Diagnosis not present

## 2021-02-11 DIAGNOSIS — E039 Hypothyroidism, unspecified: Secondary | ICD-10-CM | POA: Diagnosis not present

## 2021-02-11 DIAGNOSIS — I959 Hypotension, unspecified: Secondary | ICD-10-CM | POA: Diagnosis not present

## 2021-02-11 DIAGNOSIS — H409 Unspecified glaucoma: Secondary | ICD-10-CM | POA: Diagnosis not present

## 2021-02-11 DIAGNOSIS — K219 Gastro-esophageal reflux disease without esophagitis: Secondary | ICD-10-CM | POA: Diagnosis not present

## 2021-02-11 DIAGNOSIS — E785 Hyperlipidemia, unspecified: Secondary | ICD-10-CM | POA: Diagnosis not present

## 2021-02-11 DIAGNOSIS — S72002A Fracture of unspecified part of neck of left femur, initial encounter for closed fracture: Secondary | ICD-10-CM | POA: Diagnosis not present

## 2021-02-14 DIAGNOSIS — S72002D Fracture of unspecified part of neck of left femur, subsequent encounter for closed fracture with routine healing: Secondary | ICD-10-CM | POA: Diagnosis not present

## 2021-02-14 DIAGNOSIS — I959 Hypotension, unspecified: Secondary | ICD-10-CM | POA: Diagnosis not present

## 2021-02-14 DIAGNOSIS — Z96642 Presence of left artificial hip joint: Secondary | ICD-10-CM | POA: Diagnosis not present

## 2021-02-14 DIAGNOSIS — F419 Anxiety disorder, unspecified: Secondary | ICD-10-CM | POA: Diagnosis not present

## 2021-02-14 DIAGNOSIS — M1712 Unilateral primary osteoarthritis, left knee: Secondary | ICD-10-CM | POA: Diagnosis not present

## 2021-02-14 DIAGNOSIS — M19041 Primary osteoarthritis, right hand: Secondary | ICD-10-CM | POA: Diagnosis not present

## 2021-02-14 DIAGNOSIS — F32A Depression, unspecified: Secondary | ICD-10-CM | POA: Diagnosis not present

## 2021-02-14 DIAGNOSIS — M19042 Primary osteoarthritis, left hand: Secondary | ICD-10-CM | POA: Diagnosis not present

## 2021-02-14 DIAGNOSIS — I10 Essential (primary) hypertension: Secondary | ICD-10-CM | POA: Diagnosis not present

## 2021-02-18 ENCOUNTER — Telehealth: Payer: Self-pay

## 2021-02-18 ENCOUNTER — Other Ambulatory Visit: Payer: Self-pay | Admitting: Family Medicine

## 2021-02-18 NOTE — Telephone Encounter (Signed)
Copied from [place] [id number]. Topic: Referral - Status >> Feb 18, 2021  9:45 AM Yvette Rack wrote: Reason for CRM: Patient stated the company that she was referred to for PT is unprofessional. Patient would like a referral to another [place]. Patient stated that her insurance company provided a PT company at [place] at [employer] [phone]. Patient requests a referral to either the location provided by her insurance company or another company that Dr. Ancil Boozer suggests. Patient nephew asked that patient be contacted regarding the decision on PT company.

## 2021-02-19 NOTE — Progress Notes (Signed)
Name: [name]   MRN: [id number]    DOB: Sep 12, 1941   Date:02/20/2021       Progress Note  Subjective  Chief Complaint  Follow up, she came in with he nephew that lives in [place]   HPI  History of fracture of left hip : she fell at home, missed one of her garage steps on 01/27/2021, she called 911 and was transported to [facility], she had Total left hip replacement surgery on 01/28/2021 by Dr. Rudene Christians. She was sent home without losartan due to low bp and advised to have PT done at home , by home health. There were many cancellations and miscommunications with home health, [facility], she never had PT done and she would like to switch providers. She has finished taking lovenox. Pain seems to be under control, using walker to assist with ambulation and asked for handicap sticker today.   HTN:she was on Losartan 25 mg, but it was advised to stop taking it when she was discharged home from hip replacement surgery 01/31/2021, bp today is okay, she was advised to keep a log and we will resume medication if needed.   Hyperlipidemia: taking pravastatin and denies side effects of medication, including no myalgia. Last LDL showed increase of LDL but she told me she was not compliant with medication at the time, but has been taking it daily since last visit   Depression Major Chronic: she states doing well, still misses her son , that died at age 104from [place] in 76. Her husband died 6 years before, on the same day on October 14 th. She has two grandchildren from him and two other grandchildren from her youngest son.She states she really struggles from October until the end of December, she states in January she started to feel better. She does not want to take anything daily but willing to take something prn. I gave her Buspar to take prn in the past but she states she did not like the side effects so she stopped. Discussed grieve counseling through hospice . Unchanged, still not  interested in seeking help   GERD:she has stopped omeprazole but is now on Pepcid prn and is doing well , only taking it occasionally   Hyperglycemia: A1C was 5.8%but last visit was 5.4% Denies polyphagia, polydipsia or polyuria. She has episodes of hypoglycemia and family history of diabetes, discussed following a diabetic diet to keep her glucose at goal   Senile purpura: on both arms, gave her reassurance  Osteoporosis: she states she was given Boniva during hospital stay but she states it caused indigestion and also bone pain, it has happened in the past also, discussed labs and referral to Endo and she agreed    Patient Active Problem List   Diagnosis Date Noted  . Hypokalemia   . Hypotension   . Impaired fasting glucose   . Closed hip fracture requiring operative repair with routine healing, left 01/27/2021  . Fall at home, initial encounter 01/27/2021  . Overweight (BMI 25.0-29.9) 04/03/2020  . S/P right TKA 04/02/2020  . Status post total knee replacement, right 04/02/2020  . Presence of right artificial knee joint 04/02/2020  . Pain in right knee 02/23/2020  . Senile purpura ([place]) 12/30/2018  . S/P vaginal hysterectomy 05/30/2018  . Recurrent major depressive disorder, in full remission ([place]) 03/02/2018  . GERD (gastroesophageal reflux disease) 10/20/2016  . Anxiety, generalized 01/28/2016  . Osteoarthritis of right knee 12/25/2015  . Depression 09/05/2015  . Abnormal brain  CT 06/20/2015  . Anxiety, mild 06/20/2015  . Brain lesion 06/20/2015  . Clinical depression 06/20/2015  . Dyslipidemia 06/20/2015  . Vertigo 06/20/2015  . Hyperlipidemia 06/20/2015  . Benign hypertension 06/20/2015  . Affective disorder, major 06/20/2015  . Arthralgia of shoulder 06/20/2015  . Glaucoma 06/20/2015  . Post-menopausal 06/04/2015  . Bilateral leg weakness 06/04/2015    Past Surgical History:  Procedure Laterality Date  . ANAL FISSURE REPAIR  1970s   for bleeding in 1970s   . ANTERIOR AND POSTERIOR REPAIR WITH SACROSPINOUS FIXATION N/A 05/30/2018   Procedure: ANTERIOR AND POSTERIOR REPAIR;  Surgeon: Louretta Shorten, MD;  Location: [facility];  Service: Gynecology;  Laterality: N/A;  . COLONOSCOPY  2012   [place]  . COLONOSCOPY WITH PROPOFOL N/A 09/01/2016   Procedure: COLONOSCOPY WITH PROPOFOL;  Surgeon: Robert Bellow, MD;  Location: [facility];  Service: Endoscopy;  Laterality: N/A;  . EYE SURGERY Bilateral    cataracts removed  . KNEE SURGERY Left   . SALPINGOOPHORECTOMY Right 05/30/2018   Procedure: SALPINGO OOPHORECTOMY;  Surgeon: Louretta Shorten, MD;  Location: [facility];  Service: Gynecology;  Laterality: Right;  . SUBCLAVIAN ANGIOGRAM    . TOTAL HIP ARTHROPLASTY Left 01/28/2021   Procedure: TOTAL HIP ARTHROPLASTY ANTERIOR APPROACH;  Surgeon: Hessie Knows, MD;  Location: [facility];  Service: Orthopedics;  Laterality: Left;  . TOTAL KNEE ARTHROPLASTY Right 04/02/2020   Procedure: TOTAL KNEE ARTHROPLASTY;  Surgeon: Paralee Cancel, MD;  Location: [facility];  Service: Orthopedics;  Laterality: Right;  70 mins  . UPPER GI ENDOSCOPY  2012  . VAGINAL HYSTERECTOMY N/A 05/30/2018   Procedure: HYSTERECTOMY VAGINAL;  Surgeon: Louretta Shorten, MD;  Location: [facility];  Service: Gynecology;  Laterality: N/A;  . WISDOM TOOTH EXTRACTION      Family History  Problem Relation Age of Onset  . Cancer Mother 106       colon  . Alzheimer's disease Mother   . Hypertension Mother   . Cancer Brother        prostate  . Lymphoma Son   . Cancer Son 28       lymphoma  . Hypertension Father   . Cerebral aneurysm Father   . Diabetes Sister   . Depression Sister   . Hypertension Sister   . Depression Sister   . Cancer Brother   . Gallbladder disease Brother   . Prostate cancer Brother   . Breast cancer Neg Hx     Social History   Tobacco Use  . Smoking status: Never Smoker  . Smokeless tobacco: Never Used  . Tobacco comment: smoking cessation materials not required  Substance Use  Topics  . Alcohol use: Yes    Alcohol/week: 0.0 standard drinks    Comment: occasional beer/mixed drink     Current Outpatient Medications:  .  acetaminophen (TYLENOL) 325 MG tablet, Take 2 tablets (650 mg total) by mouth every 6 (six) hours as needed for fever, headache or moderate pain., Disp: , Rfl:  .  ALPHAGAN P 0.1 % SOLN, Place 1 drop into both eyes 3 (three) times daily. , Disp: , Rfl:  .  bimatoprost (LUMIGAN) 0.01 % SOLN, Place 1 drop into both eyes at bedtime., Disp: , Rfl:  .  Biotin 10 MG TABS, Take 1 tablet by mouth 2 (two) times daily., Disp: , Rfl:  .  docusate sodium (COLACE) 100 MG capsule, Take 1 capsule (100 mg total) by mouth 2 (two) times daily as needed for mild constipation., Disp: 60  capsule, Rfl: 0 .  fluticasone (FLONASE) 50 MCG/ACT nasal spray, SPRAY 2 SPRAYS INTO EACH NOSTRIL EVERY DAY (Patient taking differently: Place 2 sprays into both nostrils daily.), Disp: 48 mL, Rfl: 2 .  oxyCODONE-acetaminophen (PERCOCET/ROXICET) 5-325 MG tablet, Take 1 tablet by mouth every 4 (four) hours as needed for moderate pain., Disp: 30 tablet, Rfl: 0 .  PREMARIN vaginal cream, Place 1 application vaginally 2 (two) times a week., Disp: , Rfl: 12 .  White Petrolatum-Mineral Oil (SYSTANE NIGHTTIME) OINT, Place 1 application into both eyes at bedtime as needed (dry eyes)., Disp: , Rfl:  .  pravastatin (PRAVACHOL) 20 MG tablet, Take 1 tablet (20 mg total) by mouth daily., Disp: 90 tablet, Rfl: 1 .  traZODone (DESYREL) 50 MG tablet, Take 1 tablet (50 mg total) by mouth at bedtime as needed for sleep., Disp: 90 tablet, Rfl: 1  Allergies  Allergen Reactions  . Biphosphate   . Codeine     headaches  . Combigan [Brimonidine Tartrate-Timolol]     drowsy  . Sulfa Antibiotics Nausea And Vomiting and Nausea Only  . Metronidazole Itching and Rash    I personally reviewed active problem list, medication list, allergies, family history, social history, health maintenance, notes from last  encounter with the patient/caregiver today.   ROS  Constitutional: Negative for fever or weight change.  Respiratory: Negative for cough and shortness of breath.   Cardiovascular: Negative for chest pain or palpitations.  Gastrointestinal: Negative for abdominal pain, no bowel changes.  Musculoskeletal: Negative for gait problem or joint swelling.  Skin: Negative for rash.  Neurological: Negative for dizziness or headache.  No other specific complaints in a complete review of systems (except as listed in HPI above).  Objective  Vitals:   02/20/21 1148  BP: (!) 142/86  Pulse: 89  Resp: 16  Temp: 99 F (37.2 C)  TempSrc: Oral  SpO2: 97%  Weight: 144 lb (65.3 kg)  Height: 5\' 2"  (1.575 m)    Body mass index is 26.34 kg/m.  Physical Exam  Constitutional: Patient appears well-developed and well-nourished.  No distress.  HEENT: head atraumatic, normocephalic, pupils equal and reactive to light, neck supple Cardiovascular: Normal rate, regular rhythm and normal heart sounds.  No murmur heard. No BLE edema. Pulmonary/Chest: Effort normal and breath sounds normal. No respiratory distress. Abdominal: Soft.  There is no tenderness. Muscular skeletal: using walker, genu valgum, normal rom of knee, did not do a complete hip exam, has follow up with Ortho next week  Psychiatric: Patient has a normal mood and affect. behavior is normal. Judgment and thought content normal.  Recent Results (from the past 2160 hour(s))  Novel Coronavirus, NAA ([facility])     Status: None   Collection Time: 12/25/20  5:49 PM   Specimen: Nasopharyngeal(NP) swabs in vial transport medium   Nasopharynge  Result Value Ref Range   SARS-CoV-2, NAA Not Detected Not Detected    Comment: This nucleic acid amplification test was developed and its performance characteristics determined by [employer]. Nucleic acid amplification tests include RT-PCR and TMA. This test has not been FDA cleared or approved.  This test has been authorized by FDA under an Emergency Use Authorization (EUA). This test is only authorized for the duration of time the declaration that circumstances exist justifying the authorization of the emergency use of in vitro diagnostic tests for detection of SARS-CoV-2 virus and/or diagnosis of COVID-19 infection under section 564(b)(1) of the Act, 21 U.S.C. 093OIZ-1(I) (1), unless the authorization is terminated  or revoked sooner. When diagnostic testing is negative, the possibility of a false negative result should be considered in the context of a patient's recent exposures and the presence of clinical signs and symptoms consistent with COVID-19. An individual without symptoms of COVID-19 and who is not shedding SARS-CoV-2 virus wo uld expect to have a negative (not detected) result in this assay.   SARS-COV-2, NAA 2 DAY TAT     Status: None   Collection Time: 12/25/20  5:49 PM   Nasopharynge  Result Value Ref Range   SARS-CoV-2, NAA 2 DAY TAT Performed   CBC     Status: None   Collection Time: 01/27/21  7:46 AM  Result Value Ref Range   WBC 5.4 4.0 - 10.5 K/uL   RBC 4.65 3.87 - 5.11 MIL/uL   Hemoglobin 13.3 12.0 - 15.0 g/dL   HCT 40.5 36.0 - 46.0 %   MCV 87.1 80.0 - 100.0 fL   MCH 28.6 26.0 - 34.0 pg   MCHC 32.8 30.0 - 36.0 g/dL   RDW 13.5 11.5 - 15.5 %   Platelets 196 150 - 400 K/uL   nRBC 0.0 0.0 - 0.2 %    Comment: Performed at [facility], [address] [place], [place] [address]  Basic metabolic panel     Status: Abnormal   Collection Time: 01/27/21  7:46 AM  Result Value Ref Range   Sodium 140 135 - 145 mmol/L   Potassium 3.5 3.5 - 5.1 mmol/L   Chloride 107 98 - 111 mmol/L   CO2 26 22 - 32 mmol/L   Glucose, Bld 102 (H) 70 - 99 mg/dL    Comment: Glucose reference range applies only to samples taken after fasting for at least 8 hours.   BUN 14 8 - 23 mg/dL   Creatinine, Ser 0.69 0.44 - 1.00 mg/dL   Calcium 9.1 8.9 - 10.3 mg/dL   GFR,  Estimated >60 >60 mL/min    Comment: (NOTE) Calculated using the CKD-EPI Creatinine Equation (2021)    Anion gap 7 5 - 15    Comment: Performed at [facility], [address] [place], [place] [address]  Resp Panel by RT-PCR (Flu A&B, Covid) Nasopharyngeal Swab     Status: None   Collection Time: 01/27/21  7:46 AM   Specimen: Nasopharyngeal Swab; Nasopharyngeal(NP) swabs in vial transport medium  Result Value Ref Range   SARS Coronavirus 2 by RT PCR NEGATIVE NEGATIVE    Comment: (NOTE) SARS-CoV-2 target nucleic acids are NOT DETECTED.  The SARS-CoV-2 RNA is generally detectable in upper respiratory specimens during the acute phase of infection. The lowest concentration of SARS-CoV-2 viral copies this assay can detect is 138 copies/mL. A negative result does not preclude SARS-Cov-2 infection and should not be used as the sole basis for treatment or other patient management decisions. A negative result may occur with  improper specimen collection/handling, submission of specimen other than nasopharyngeal swab, presence of viral mutation(s) within the areas targeted by this assay, and inadequate number of viral copies(<138 copies/mL). A negative result must be combined with clinical observations, patient history, and epidemiological information. The expected result is Negative.  Fact Sheet for Patients:  EntrepreneurPulse.com.au  Fact Sheet for Healthcare Providers:  IncredibleEmployment.be  This test is no t yet approved or cleared by the [place] FDA and  has been authorized for detection and/or diagnosis of SARS-CoV-2 by FDA under an Emergency Use Authorization (EUA). This EUA will remain  in effect (meaning  this test can be used) for the duration of the COVID-19 declaration under Section 564(b)(1) of the Act, 21 U.S.C.section 360bbb-3(b)(1), unless the authorization is terminated  or revoked sooner.       Influenza A by  PCR NEGATIVE NEGATIVE   Influenza B by PCR NEGATIVE NEGATIVE    Comment: (NOTE) The Xpert Xpress SARS-CoV-2/FLU/RSV plus assay is intended as an aid in the diagnosis of influenza from Nasopharyngeal swab specimens and should not be used as a sole basis for treatment. Nasal washings and aspirates are unacceptable for Xpert Xpress SARS-CoV-2/FLU/RSV testing.  Fact Sheet for Patients: EntrepreneurPulse.com.au  Fact Sheet for Healthcare Providers: IncredibleEmployment.be  This test is not yet approved or cleared by the [place] FDA and has been authorized for detection and/or diagnosis of SARS-CoV-2 by FDA under an Emergency Use Authorization (EUA). This EUA will remain in effect (meaning this test can be used) for the duration of the COVID-19 declaration under Section 564(b)(1) of the Act, 21 U.S.C. section 360bbb-3(b)(1), unless the authorization is terminated or revoked.  Performed at [facility], [address] [place], [place] [address]   Sample to Blood Bank     Status: None   Collection Time: 01/27/21  7:46 AM  Result Value Ref Range   Blood Bank Specimen SAMPLE AVAILABLE FOR TESTING    Sample Expiration      01/30/2021,2359 Performed at [facility], [address] [place], [place] [address]   CBC     Status: None   Collection Time: 01/28/21  4:43 AM  Result Value Ref Range   WBC 9.5 4.0 - 10.5 K/uL   RBC 4.65 3.87 - 5.11 MIL/uL   Hemoglobin 13.3 12.0 - 15.0 g/dL   HCT 39.9 36.0 - 46.0 %   MCV 85.8 80.0 - 100.0 fL   MCH 28.6 26.0 - 34.0 pg   MCHC 33.3 30.0 - 36.0 g/dL   RDW 13.6 11.5 - 15.5 %   Platelets 210 150 - 400 K/uL   nRBC 0.0 0.0 - 0.2 %    Comment: Performed at [facility], [address] [place], [place] [address]  Basic metabolic panel     Status: Abnormal   Collection Time: 01/28/21  4:43 AM  Result Value Ref Range   Sodium 137 135 - 145 mmol/L   Potassium 3.9 3.5 - 5.1  mmol/L   Chloride 102 98 - 111 mmol/L   CO2 26 22 - 32 mmol/L   Glucose, Bld 116 (H) 70 - 99 mg/dL    Comment: Glucose reference range applies only to samples taken after fasting for at least 8 hours.   BUN 14 8 - 23 mg/dL   Creatinine, Ser 0.67 0.44 - 1.00 mg/dL   Calcium 9.1 8.9 - 10.3 mg/dL   GFR, Estimated >60 >60 mL/min    Comment: (NOTE) Calculated using the CKD-EPI Creatinine Equation (2021)    Anion gap 9 5 - 15    Comment: Performed at [facility], [address] [place], [place] [address]  Protime-INR     Status: None   Collection Time: 01/28/21  4:43 AM  Result Value Ref Range   Prothrombin Time 13.2 11.4 - 15.2 seconds   INR 1.0 0.8 - 1.2    Comment: Performed at [facility], [address], [place],  [address]  APTT     Status: None   Collection Time: 01/28/21  4:43 AM  Result Value Ref Range   aPTT 32 24 -  36 seconds    Comment: Performed at [facility], [address] [place], [place] [address]  Type and screen [place]     Status: None   Collection Time: 01/28/21  7:54 AM  Result Value Ref Range   ABO/RH(D) A POS    Antibody Screen NEG    Sample Expiration      01/31/2021,2359 Performed at [facility], [address] [place], [place] [address]   Surgical pathology     Status: None   Collection Time: 01/28/21  1:33 PM  Result Value Ref Range   SURGICAL PATHOLOGY      SURGICAL PATHOLOGY CASE: [id number] PATIENT: [facility] Surgical Pathology Report     Specimen Submitted: A. Femoral head, left  Clinical History: Left hip fracture      DIAGNOSIS: A. FEMORAL HEAD, LEFT; ARTHROPLASTY: - BONE MARROW CHANGES CONSISTENT WITH PROVIDED HISTORY OF FRACTURE. - OSTEOARTHROSIS. - NEGATIVE FOR MALIGNANCY.  GROSS DESCRIPTION: A. Labeled: Left femoral head Received: Formalin Collection time: 2 PM on 01/28/2021 Placed into formalin time: 2 PM on 01/28/2021 Size of  specimen:      Head -4.5 x 4.5 x 3.7 cm      Neck -2.5 x 2.1 x 2 cm      Additional tissue: None grossly appreciated Articular surface: The articular surface is tan and smooth with scattered ill-defined areas of slight roughening. Cut surface: The cut surface is tan-yellow and firm with areas of hemorrhage adjacent to the jagged resection margin. Other findings: The resection margin is jagged and hemorrhagic with scattered areas of adherent blood clot.  Block  summary: 1 - femoral resection margin reamings, following decalcification 2 - representative cartilage to adjacent jagged resection margin, following decalcification  Tissue decalcification: Yes, cassettes 1-2  RB 01/29/2021   Final Diagnosis performed by Quay Burow, MD.   Electronically signed 01/31/2021 11:39:07AM The electronic signature indicates that the named Attending Pathologist has evaluated the specimen Technical component performed at [facility], [address] [place], [place] [address] Lab: [phone] Dir: Rush Farmer, MD, MMM  Professional component performed at [facility], [facility], [place], [place], [place] [address] Lab: [phone] Dir: Dellia Nims. Rubinas, MD   CBC     Status: Abnormal   Collection Time: 01/29/21  5:39 AM  Result Value Ref Range   WBC 8.3 4.0 - 10.5 K/uL   RBC 3.68 (L) 3.87 - 5.11 MIL/uL   Hemoglobin 10.7 (L) 12.0 - 15.0 g/dL   HCT 32.0 (L) 36.0 - 46.0 %   MCV 87.0 80.0 - 100.0 fL   MCH 29.1 26.0 - 34.0 pg   MCHC 33.4 30.0 - 36.0 g/dL   RDW 13.7 11.5 - 15.5 %   Platelets 169 150 - 400 K/uL   nRBC 0.0 0.0 - 0.2 %    Comment: Performed at [facility], [address] [place], [place] [address]  Basic metabolic panel     Status: Abnormal   Collection Time: 01/29/21  5:39 AM  Result Value Ref Range   Sodium 140 135 - 145 mmol/L   Potassium 3.6 3.5 - 5.1 mmol/L   Chloride 106 98 - 111 mmol/L   CO2 28 22 - 32 mmol/L   Glucose, Bld 114 (H) 70 -  99 mg/dL    Comment: Glucose reference range applies only to samples taken after fasting for at least 8 hours.   BUN 15 8 - 23 mg/dL   Creatinine, Ser 0.73 0.44 -  1.00 mg/dL   Calcium 8.1 (L) 8.9 - 10.3 mg/dL   GFR, Estimated >60 >60 mL/min    Comment: (NOTE) Calculated using the CKD-EPI Creatinine Equation (2021)    Anion gap 6 5 - 15    Comment: Performed at [facility], [address] [place], [place] [address]  CBC     Status: Abnormal   Collection Time: 01/30/21  5:47 AM  Result Value Ref Range   WBC 6.9 4.0 - 10.5 K/uL   RBC 3.33 (L) 3.87 - 5.11 MIL/uL   Hemoglobin 9.6 (L) 12.0 - 15.0 g/dL   HCT 29.2 (L) 36.0 - 46.0 %   MCV 87.7 80.0 - 100.0 fL   MCH 28.8 26.0 - 34.0 pg   MCHC 32.9 30.0 - 36.0 g/dL   RDW 13.9 11.5 - 15.5 %   Platelets 140 (L) 150 - 400 K/uL   nRBC 0.0 0.0 - 0.2 %    Comment: Performed at [facility], [address] [place], [place] [address]  Basic metabolic panel     Status: Abnormal   Collection Time: 01/30/21  5:47 AM  Result Value Ref Range   Sodium 138 135 - 145 mmol/L   Potassium 3.4 (L) 3.5 - 5.1 mmol/L   Chloride 104 98 - 111 mmol/L   CO2 27 22 - 32 mmol/L   Glucose, Bld 109 (H) 70 - 99 mg/dL    Comment: Glucose reference range applies only to samples taken after fasting for at least 8 hours.   BUN 18 8 - 23 mg/dL   Creatinine, Ser 0.73 0.44 - 1.00 mg/dL   Calcium 8.2 (L) 8.9 - 10.3 mg/dL   GFR, Estimated >60 >60 mL/min    Comment: (NOTE) Calculated using the CKD-EPI Creatinine Equation (2021)    Anion gap 7 5 - 15    Comment: Performed at [facility], [address] [place], [place] [address]  SARS CORONAVIRUS 2 (TAT 6-24 HRS) Nasopharyngeal Nasopharyngeal Swab     Status: None   Collection Time: 01/30/21  1:06 PM   Specimen: Nasopharyngeal Swab  Result Value Ref Range   SARS Coronavirus 2 NEGATIVE NEGATIVE    Comment: (NOTE) SARS-CoV-2 target nucleic acids are NOT DETECTED.  The SARS-CoV-2 RNA is  generally detectable in upper and lower respiratory specimens during the acute phase of infection. Negative results do not preclude SARS-CoV-2 infection, do not rule out co-infections with other pathogens, and should not be used as the sole basis for treatment or other patient management decisions. Negative results must be combined with clinical observations, patient history, and epidemiological information. The expected result is Negative.  Fact Sheet for Patients: SugarRoll.be  Fact Sheet for Healthcare Providers: https://www.woods-mathews.com/  This test is not yet approved or cleared by the [place] FDA and  has been authorized for detection and/or diagnosis of SARS-CoV-2 by FDA under an Emergency Use Authorization (EUA). This EUA will remain  in effect (meaning this test can be used) for the duration of the COVID-19 declaration under Se ction 564(b)(1) of the Act, 21 U.S.C. section 360bbb-3(b)(1), unless the authorization is terminated or revoked sooner.  Performed at [facility], [place] [address] [place], [place] [address]   Basic metabolic panel     Status: Abnormal   Collection Time: 01/31/21  5:36 AM  Result Value Ref Range   Sodium 136 135 - 145 mmol/L   Potassium 3.9 3.5 - 5.1 mmol/L   Chloride 104 98 - 111 mmol/L  CO2 28 22 - 32 mmol/L   Glucose, Bld 104 (H) 70 - 99 mg/dL    Comment: Glucose reference range applies only to samples taken after fasting for at least 8 hours.   BUN 16 8 - 23 mg/dL   Creatinine, Ser 0.62 0.44 - 1.00 mg/dL   Calcium 8.1 (L) 8.9 - 10.3 mg/dL   GFR, Estimated >60 >60 mL/min    Comment: (NOTE) Calculated using the CKD-EPI Creatinine Equation (2021)    Anion gap 4 (L) 5 - 15    Comment: Performed at [facility], [address] [place], [place] [address]  CBC     Status: Abnormal   Collection Time: 01/31/21  5:36 AM  Result Value Ref Range   WBC 7.5 4.0 - 10.5 K/uL   RBC  3.34 (L) 3.87 - 5.11 MIL/uL   Hemoglobin 9.7 (L) 12.0 - 15.0 g/dL   HCT 29.1 (L) 36.0 - 46.0 %   MCV 87.1 80.0 - 100.0 fL   MCH 29.0 26.0 - 34.0 pg   MCHC 33.3 30.0 - 36.0 g/dL   RDW 13.7 11.5 - 15.5 %   Platelets 175 150 - 400 K/uL   nRBC 0.0 0.0 - 0.2 %    Comment: Performed at [facility], [address] [place], [place] [address]      PHQ2/9: Depression screen [facility] 2/9 02/20/2021 11/04/2020 08/26/2020 03/19/2020 01/25/2020  Decreased Interest 0 0 0 0 0  Down, Depressed, Hopeless 0 1 2 1  0  PHQ - 2 Score 0 1 2 1  0  Altered sleeping 0 0 0 0 -  Tired, decreased energy 3 2 2  0 -  Change in appetite 0 0 0 0 -  Feeling bad or failure about yourself  0 0 0 0 -  Trouble concentrating 0 0 1 1 -  Moving slowly or fidgety/restless 0 0 0 0 -  Suicidal thoughts 0 0 0 0 -  PHQ-9 Score 3 3 5 2  -  Difficult doing work/chores - Not difficult at all Not difficult at all Not difficult at all -  Some recent data might be hidden    phq 9 is negative   Fall Risk: Fall Risk  02/20/2021 11/04/2020 08/26/2020 03/19/2020 01/25/2020  Falls in the past year? 1 0 0 0 0  Comment - - - - -  Number falls in past yr: 0 0 0 0 0  Injury with Fall? 0 0 0 0 0  Risk for fall due to : - - - - No Fall Risks  Risk for fall due to: Comment - - - - -  Follow up - - - - Falls prevention discussed     Functional Status Survey: Is the patient deaf or have difficulty hearing?: No Does the patient have difficulty seeing, even when wearing glasses/contacts?: Yes Does the patient have difficulty concentrating, remembering, or making decisions?: No Does the patient have difficulty walking or climbing stairs?: No Does the patient have difficulty dressing or bathing?: No Does the patient have difficulty doing errands alone such as visiting a doctor's office or shopping?: No    Assessment & Plan  1. History of total left hip replacement  - Ambulatory referral to [facility] - Ambulatory referral to  Endocrinology  2. Essential hypertension  She is off losartan because bp drop during hospital stay   3. Dyslipidemia  - pravastatin (PRAVACHOL) 20 MG tablet; Take 1 tablet (20 mg total) by mouth daily.  Dispense: 90 tablet;  Refill: 1  4. Senile purpura (HCC)  Stable   5. Primary osteoarthritis of right knee  S/p replacement  6. Gastroesophageal reflux disease without esophagitis  Stable.   7. Age-related osteoporosis with current pathological fracture with routine healing, subsequent encounter  - Ambulatory referral to [facility] - Ambulatory referral to Endocrinology - COMPLETE METABOLIC PANEL WITH GFR - TSH - VITAMIN D 25 Hydroxy (Vit-D Deficiency, Fractures) - Parathyroid hormone, intact (no Ca)  8. Major depression, recurrent, chronic (HCC)  Refuses medication, good family support   9. Insomnia, unspecified type  - traZODone (DESYREL) 50 MG tablet; Take 1 tablet (50 mg total) by mouth at bedtime as needed for sleep.  Dispense: 90 tablet; Refill: 1

## 2021-02-20 ENCOUNTER — Ambulatory Visit (INDEPENDENT_AMBULATORY_CARE_PROVIDER_SITE_OTHER): Payer: Medicare HMO | Admitting: Family Medicine

## 2021-02-20 ENCOUNTER — Other Ambulatory Visit: Payer: Self-pay

## 2021-02-20 ENCOUNTER — Encounter: Payer: Self-pay | Admitting: Family Medicine

## 2021-02-20 VITALS — BP 142/86 | HR 89 | Temp 99.0°F | Resp 16 | Ht 62.0 in | Wt 144.0 lb

## 2021-02-20 DIAGNOSIS — M8000XD Age-related osteoporosis with current pathological fracture, unspecified site, subsequent encounter for fracture with routine healing: Secondary | ICD-10-CM

## 2021-02-20 DIAGNOSIS — K219 Gastro-esophageal reflux disease without esophagitis: Secondary | ICD-10-CM

## 2021-02-20 DIAGNOSIS — M1711 Unilateral primary osteoarthritis, right knee: Secondary | ICD-10-CM | POA: Diagnosis not present

## 2021-02-20 DIAGNOSIS — G4700 Insomnia, unspecified: Secondary | ICD-10-CM | POA: Diagnosis not present

## 2021-02-20 DIAGNOSIS — E785 Hyperlipidemia, unspecified: Secondary | ICD-10-CM

## 2021-02-20 DIAGNOSIS — D692 Other nonthrombocytopenic purpura: Secondary | ICD-10-CM | POA: Diagnosis not present

## 2021-02-20 DIAGNOSIS — M810 Age-related osteoporosis without current pathological fracture: Secondary | ICD-10-CM | POA: Diagnosis not present

## 2021-02-20 DIAGNOSIS — Z96642 Presence of left artificial hip joint: Secondary | ICD-10-CM | POA: Diagnosis not present

## 2021-02-20 DIAGNOSIS — I10 Essential (primary) hypertension: Secondary | ICD-10-CM

## 2021-02-20 DIAGNOSIS — F339 Major depressive disorder, recurrent, unspecified: Secondary | ICD-10-CM | POA: Diagnosis not present

## 2021-02-20 MED ORDER — TRAZODONE HCL 50 MG PO TABS: 50.0000 mg | ORAL_TABLET | Freq: Every evening | ORAL | 1 refills | Status: DC | PRN

## 2021-02-20 MED ORDER — PRAVASTATIN SODIUM 20 MG PO TABS: 20.0000 mg | ORAL_TABLET | Freq: Every day | ORAL | 1 refills | Status: DC

## 2021-02-20 NOTE — Patient Instructions (Signed)
Check bp at home and keep a log, goal is bp between 130-140/75-85

## 2021-02-25 ENCOUNTER — Telehealth: Payer: Self-pay

## 2021-02-25 ENCOUNTER — Ambulatory Visit: Payer: Medicare HMO | Admitting: Family Medicine

## 2021-02-25 DIAGNOSIS — M8000XD Age-related osteoporosis with current pathological fracture, unspecified site, subsequent encounter for fracture with routine healing: Secondary | ICD-10-CM | POA: Diagnosis not present

## 2021-02-25 DIAGNOSIS — R531 Weakness: Secondary | ICD-10-CM | POA: Diagnosis not present

## 2021-02-26 LAB — COMPLETE METABOLIC PANEL WITH GFR
AG Ratio: 1.4 (calc) (ref 1.0–2.5)
ALT: 8 U/L (ref 6–29)
AST: 13 U/L (ref 10–35)
Albumin: 4 g/dL (ref 3.6–5.1)
Alkaline phosphatase (APISO): 103 U/L (ref 37–153)
BUN: 11 mg/dL (ref 7–25)
CO2: 29 mmol/L (ref 20–32)
Calcium: 9.1 mg/dL (ref 8.6–10.4)
Chloride: 109 mmol/L (ref 98–110)
Creat: 0.67 mg/dL (ref 0.60–0.88)
GFR, Est African American: 96 mL/min/{1.73_m2} (ref >=60)
GFR, Est Non African American: 83 mL/min/{1.73_m2} (ref >=60)
Globulin: 2.8 g/dL (calc) (ref 1.9–3.7)
Glucose, Bld: 85 mg/dL (ref 65–99)
Potassium: 4.4 mmol/L (ref 3.5–5.3)
Sodium: 145 mmol/L (ref 135–146)
Total Bilirubin: 0.4 mg/dL (ref 0.2–1.2)
Total Protein: 6.8 g/dL (ref 6.1–8.1)

## 2021-02-26 LAB — PARATHYROID HORMONE, INTACT (NO CA): PTH: 68 pg/mL (ref 16–77)

## 2021-02-26 LAB — VITAMIN D 25 HYDROXY (VIT D DEFICIENCY, FRACTURES): Vit D, 25-Hydroxy: 37 ng/mL (ref 30–100)

## 2021-02-26 LAB — TSH: TSH: 2.47 mIU/L (ref 0.40–4.50)

## 2021-02-26 NOTE — Telephone Encounter (Signed)
Pt brought BP readings

## 2021-03-03 ENCOUNTER — Telehealth: Payer: Self-pay

## 2021-03-03 DIAGNOSIS — Z96642 Presence of left artificial hip joint: Secondary | ICD-10-CM

## 2021-03-03 NOTE — Telephone Encounter (Signed)
Copied from [place] [id number]. Topic: Referral - Request for Referral >> Feb 28, 2021 11:38 AM Tessa Lerner A wrote: Has patient seen PCP for this complaint? Yes  *If NO, is insurance requiring patient see PCP for this issue before PCP can refer them?  Referral for which specialty: [place] / Physical Therapy  Preferred provider/office: [facility]   Reason for referral: Patient has had a recent hip replacement

## 2021-03-13 DIAGNOSIS — Z96642 Presence of left artificial hip joint: Secondary | ICD-10-CM | POA: Diagnosis not present

## 2021-03-18 DIAGNOSIS — Z96642 Presence of left artificial hip joint: Secondary | ICD-10-CM | POA: Diagnosis not present

## 2021-03-21 DIAGNOSIS — Z96642 Presence of left artificial hip joint: Secondary | ICD-10-CM | POA: Diagnosis not present

## 2021-03-24 DIAGNOSIS — Z96642 Presence of left artificial hip joint: Secondary | ICD-10-CM | POA: Diagnosis not present

## 2021-03-25 DIAGNOSIS — Z96642 Presence of left artificial hip joint: Secondary | ICD-10-CM | POA: Diagnosis not present

## 2021-03-27 DIAGNOSIS — Z96651 Presence of right artificial knee joint: Secondary | ICD-10-CM | POA: Diagnosis not present

## 2021-03-31 DIAGNOSIS — Z96642 Presence of left artificial hip joint: Secondary | ICD-10-CM | POA: Diagnosis not present

## 2021-04-03 DIAGNOSIS — Z96642 Presence of left artificial hip joint: Secondary | ICD-10-CM | POA: Diagnosis not present

## 2021-04-07 DIAGNOSIS — Z96642 Presence of left artificial hip joint: Secondary | ICD-10-CM | POA: Diagnosis not present

## 2021-04-11 DIAGNOSIS — Z96642 Presence of left artificial hip joint: Secondary | ICD-10-CM | POA: Diagnosis not present

## 2021-04-22 ENCOUNTER — Ambulatory Visit (INDEPENDENT_AMBULATORY_CARE_PROVIDER_SITE_OTHER): Payer: Medicare HMO

## 2021-04-22 ENCOUNTER — Other Ambulatory Visit: Payer: Self-pay

## 2021-04-22 VITALS — BP 140/78 | HR 83 | Temp 97.4°F | Resp 16 | Ht 62.0 in | Wt 149.6 lb

## 2021-04-22 DIAGNOSIS — Z0000 Encounter for general adult medical examination without abnormal findings: Secondary | ICD-10-CM

## 2021-04-22 NOTE — Progress Notes (Signed)
Subjective:   [name] is a 80 y.o. female who presents for Medicare Annual (Subsequent) preventive examination.  Review of Systems     Cardiac Risk Factors include: advanced age (>11men, >25 women);dyslipidemia;hypertension     Objective:    Today's Vitals   04/22/21 1137 04/22/21 1139  BP: 140/78   Pulse: 83   Resp: 16   Temp: (!) 97.4 F (36.3 C)   TempSrc: Oral   SpO2: 98%   Weight: 149 lb 9.6 oz (67.9 kg)   Height: 5\' 2"  (1.575 m)   PainSc:  4    Body mass index is 27.36 kg/m.  Advanced Directives 04/22/2021 01/29/2021 01/27/2021 04/02/2020 03/26/2020 01/25/2020 05/30/2018  Does Patient Have a Medical Advance Directive? Yes No No Yes No Yes Yes  Type of Paramedic of [place];Living will - - [place];Living will - [place];Living will Living will;Healthcare Power of Attorney  Does patient want to make changes to medical advance directive? - - - No - Patient declined - - No - Patient declined  Copy of [place] in Chart? No - copy requested - - No - copy requested - No - copy requested No - copy requested  Would patient like information on creating a medical advance directive? - No - Patient declined - - No - Patient declined - -  Some encounter information is confidential and restricted. Go to Review Flowsheets activity to see all data.    Current Medications (verified) Outpatient Encounter Medications as of 04/22/2021  Medication Sig  . acetaminophen (TYLENOL) 325 MG tablet Take 2 tablets (650 mg total) by mouth every 6 (six) hours as needed for fever, headache or moderate pain. (Patient taking differently: Take 500 mg by mouth every 6 (six) hours as needed for fever, headache or moderate pain.)  . ALPHAGAN P 0.1 % SOLN Place 1 drop into both eyes 3 (three) times daily.   . bimatoprost (LUMIGAN) 0.01 % SOLN Place 1 drop into both eyes at bedtime.  . docusate sodium (COLACE) 100 MG  capsule Take 1 capsule (100 mg total) by mouth 2 (two) times daily as needed for mild constipation.  . fluticasone (FLONASE) 50 MCG/ACT nasal spray SPRAY 2 SPRAYS INTO EACH NOSTRIL EVERY DAY (Patient taking differently: Place 2 sprays into both nostrils daily.)  . Polyethyl Glycol-Propyl Glycol 0.4-0.3 % SOLN Apply to eye.  . pravastatin (PRAVACHOL) 20 MG tablet Take 1 tablet (20 mg total) by mouth daily.  Marland Kitchen PREMARIN vaginal cream Place 1 application vaginally 2 (two) times a week.  Dema Severin Petrolatum-Mineral Oil (SYSTANE NIGHTTIME) OINT Place 1 application into both eyes at bedtime as needed (dry eyes).  . [DISCONTINUED] Biotin 10 MG TABS Take 1 tablet by mouth 2 (two) times daily.  . [DISCONTINUED] oxyCODONE-acetaminophen (PERCOCET/ROXICET) 5-325 MG tablet Take 1 tablet by mouth every 4 (four) hours as needed for moderate pain.  . [DISCONTINUED] traZODone (DESYREL) 50 MG tablet Take 1 tablet (50 mg total) by mouth at bedtime as needed for sleep.   No facility-administered encounter medications on file as of 04/22/2021.    Allergies (verified) Biphosphate, Codeine, Combigan [brimonidine tartrate-timolol], Sulfa antibiotics, and Metronidazole   History: Past Medical History:  Diagnosis Date  . Anemia   . Anxiety    no meds  . Arthritis    hands, knees - no meds  . Bilateral leg weakness   . Complication of anesthesia    Difficult to arouse  .  Dental bridge present    perm lower front bottom bridge  . Depression    no meds  . Eyes swollen    and red - was seen by MD for possible allergy to new eye drops  . GERD (gastroesophageal reflux disease)    diet controlled - only takes med prn basis  . Glaucoma   . Hypercholesteremia   . Hypertension   . SVD (spontaneous vaginal delivery)    x 3 - only one currently living  . Vertigo    Past Surgical History:  Procedure Laterality Date  . ANAL FISSURE REPAIR  1970s   for bleeding in 1970s  . ANTERIOR AND POSTERIOR REPAIR WITH  SACROSPINOUS FIXATION N/A 05/30/2018   Procedure: ANTERIOR AND POSTERIOR REPAIR;  Surgeon: Louretta Shorten, MD;  Location: [facility];  Service: Gynecology;  Laterality: N/A;  . COLONOSCOPY  2012   [place]  . COLONOSCOPY WITH PROPOFOL N/A 09/01/2016   Procedure: COLONOSCOPY WITH PROPOFOL;  Surgeon: Robert Bellow, MD;  Location: [facility]COPY;  Service: Endoscopy;  Laterality: N/A;  . EYE SURGERY Bilateral    cataracts removed  . KNEE SURGERY Left   . SALPINGOOPHORECTOMY Right 05/30/2018   Procedure: SALPINGO OOPHORECTOMY;  Surgeon: Louretta Shorten, MD;  Location: [facility];  Service: Gynecology;  Laterality: Right;  . SUBCLAVIAN ANGIOGRAM    . TOTAL HIP ARTHROPLASTY Left 01/28/2021   Procedure: TOTAL HIP ARTHROPLASTY ANTERIOR APPROACH;  Surgeon: Hessie Knows, MD;  Location: [facility];  Service: Orthopedics;  Laterality: Left;  . TOTAL KNEE ARTHROPLASTY Right 04/02/2020   Procedure: TOTAL KNEE ARTHROPLASTY;  Surgeon: Paralee Cancel, MD;  Location: [facility];  Service: Orthopedics;  Laterality: Right;  70 mins  . UPPER GI ENDOSCOPY  2012  . VAGINAL HYSTERECTOMY N/A 05/30/2018   Procedure: HYSTERECTOMY VAGINAL;  Surgeon: Louretta Shorten, MD;  Location: [facility];  Service: Gynecology;  Laterality: N/A;  . WISDOM TOOTH EXTRACTION     Family History  Problem Relation Age of Onset  . Cancer Mother 60       colon  . Alzheimer's disease Mother   . Hypertension Mother   . Cancer Brother        prostate  . Lymphoma Son   . Cancer Son 51       lymphoma  . Hypertension Father   . Cerebral aneurysm Father   . Diabetes Sister   . Depression Sister   . Hypertension Sister   . Depression Sister   . Cancer Brother   . Gallbladder disease Brother   . Prostate cancer Brother   . Breast cancer Neg Hx    Social History   Socioeconomic History  . Marital status: Widowed    Spouse name: [name]  . Number of children: 3  . Years of education: Not on file  . Highest education level: 12th grade  Occupational History     Employer: [employer]    Comment: retired  Tobacco Use  . Smoking status: Never Smoker  . Smokeless tobacco: Never Used  . Tobacco comment: smoking cessation materials not required  Vaping Use  . Vaping Use: Never used  Substance and Sexual Activity  . Alcohol use: Yes    Alcohol/week: 0.0 standard drinks    Comment: occasional beer/mixed drink  . Drug use: No  . Sexual activity: Yes    Partners: Male    Birth control/protection: Post-menopausal  Other Topics Concern  . Not on file  Social History Narrative   Pt lives alone. Significant other  passed away in Jan 2022   Social Determinants of Health   Financial Resource Strain: Low Risk   . Difficulty of Paying Living Expenses: Not hard at all  Food Insecurity: No Food Insecurity  . Worried About Charity fundraiser in the Last Year: Never true  . Ran Out of Food in the Last Year: Never true  Transportation Needs: No Transportation Needs  . Lack of Transportation (Medical): No  . Lack of Transportation (Non-Medical): No  Physical Activity: Sufficiently Active  . Days of Exercise per Week: 3 days  . Minutes of Exercise per Session: 60 min  Stress: No Stress Concern Present  . Feeling of Stress : Only a little  Social Connections: Socially Isolated  . Frequency of Communication with Friends and Family: More than three times a week  . Frequency of Social Gatherings with Friends and Family: Three times a week  . Attends Religious Services: Never  . Active Member of Clubs or Organizations: No  . Attends Archivist Meetings: Never  . Marital Status: Widowed    Tobacco Counseling Counseling given: Not Answered Comment: smoking cessation materials not required   Clinical Intake:  Pre-visit preparation completed: Yes  Pain : 0-10 Pain Score: 4  Pain Type: Chronic pain Pain Location: Back Pain Orientation: Right,Lower Pain Descriptors / Indicators: Aching,Sore Pain Onset: More than a month ago Pain Frequency:  Constant     BMI - recorded: 27.36 Nutritional Status: BMI 25 -29 Overweight Nutritional Risks: None Diabetes: No  How often do you need to have someone help you when you read instructions, pamphlets, or other written materials from your doctor or pharmacy?: 1 - Never    Interpreter Needed?: No  Information entered by :: Clemetine Marker LPN   Activities of Daily Living In your present state of health, do you have any difficulty performing the following activities: 04/22/2021 02/20/2021  Hearing? N N  Comment declines hearing aids -  Vision? N Y  Difficulty concentrating or making decisions? N N  Walking or climbing stairs? Y N  Dressing or bathing? N N  Doing errands, shopping? N N  Preparing Food and eating ? N -  Using the Toilet? N -  In the past six months, have you accidently leaked urine? Y -  Comment occasional urge incontinence when sneezing -  Do you have problems with loss of bowel control? N -  Managing your Medications? N -  Managing your Finances? N -  Housekeeping or managing your Housekeeping? N -  Some recent data might be hidden    Patient Care Team: Steele Sizer, MD as PCP - General (Family Medicine) Dingeldein, Remo Lipps, MD as Consulting Physician (Ophthalmology) Louretta Shorten, MD as Consulting Physician (Obstetrics and Gynecology)  Indicate any recent Medical Services you may have received from other than [facility] providers in the past year (date may be approximate).     Assessment:   This is a routine wellness examination for [place].  Hearing/Vision screen  Hearing Screening   125Hz  250Hz  500Hz  1000Hz  2000Hz  3000Hz  4000Hz  6000Hz  8000Hz   Right ear:           Left ear:           Comments: Pt denies hearing difficulty  Vision Screening Comments: Annual vision screenings done at [facility] Dr. Thomasene Ripple  Dietary issues and exercise activities discussed: Current Exercise Habits: Home exercise routine, Type of exercise: walking;strength  training/weights;Other - see comments (exercise bike), Time (Minutes): 60, Frequency (Times/Week): 3, Weekly  Exercise (Minutes/Week): 180, Intensity: Moderate, Exercise limited by: orthopedic condition(s)  Goals Addressed            This Visit's Progress   . DIET - INCREASE WATER INTAKE   Not on track    Recommend to drink at least 6-8 8oz glasses of water per day.      Depression Screen PHQ 2/9 Scores 04/22/2021 02/20/2021 11/04/2020 08/26/2020 03/19/2020 01/25/2020 01/01/2020  PHQ - 2 Score 1 0 1 2 1  0 0  PHQ- 9 Score 2 3 3 5 2  - 2    Fall Risk Fall Risk  04/22/2021 02/20/2021 11/04/2020 08/26/2020 03/19/2020  Falls in the past year? 1 1 0 0 0  Comment - - - - -  Number falls in past yr: 0 0 0 0 0  Injury with Fall? 1 0 0 0 0  Risk for fall due to : History of fall(s) - - - -  Risk for fall due to: Comment - - - - -  Follow up Falls prevention discussed - - - -    FALL RISK PREVENTION PERTAINING TO THE HOME:  Any stairs in or around the home? Yes  If so, are there any without handrails? No  Home free of loose throw rugs in walkways, pet beds, electrical cords, etc? Yes  Adequate lighting in your home to reduce risk of falls? Yes   ASSISTIVE DEVICES UTILIZED TO PREVENT FALLS:  Life alert? No  Use of a cane, walker or w/c? No  Grab bars in the bathroom? No  Shower chair or bench in shower? No  Elevated toilet seat or a handicapped toilet? No   TIMED UP AND GO:  Was the test performed? Yes .  Length of time to ambulate 10 feet: 6 sec.   Gait slow and steady without use of assistive device  Cognitive Function:     6CIT Screen 01/25/2020 02/08/2018 03/01/2017  What Year? 0 points 0 points 0 points  What month? 0 points 0 points 0 points  What time? 0 points 0 points 0 points  Count back from 20 0 points 0 points 0 points  Months in reverse 0 points 0 points 0 points  Repeat phrase 0 points 0 points 4 points  Total Score 0 0 4    Immunizations Immunization History   Administered Date(s) Administered  . Fluad Quad(high Dose 65+) 09/05/2019, 08/26/2020  . Influenza, High Dose Seasonal PF 12/25/2015, 10/20/2016, 10/15/2017, 12/30/2018  . Influenza,inj,Quad PF,6+ Mos 09/04/2014  . Moderna Sars-Covid-2 Vaccination 10/27/2020  . PFIZER(Purple Top)SARS-COV-2 Vaccination 02/10/2020, 03/01/2020  . Pneumococcal Conjugate-13 03/01/2017  . Pneumococcal Polysaccharide-23 05/14/2014  . Tdap 11/17/2011    TDAP status: Up to date  Flu Vaccine status: Up to date  Pneumococcal vaccine status: Up to date  Covid-19 vaccine status: Completed vaccines  Qualifies for Shingles Vaccine? Yes   Zostavax completed No   Shingrix Completed?: No.    Education has been provided regarding the importance of this vaccine. Patient has been advised to call insurance company to determine out of pocket expense if they have not yet received this vaccine. Advised may also receive vaccine at local pharmacy or Health Dept. Verbalized acceptance and understanding.  Screening Tests Health Maintenance  Topic Date Due  . Pneumococcal Vaccine 59-80 Years old (1 of 4 - PCV13) Never done  . Zoster Vaccines- Shingrix (1 of 2) Never done  . INFLUENZA VACCINE  06/16/2021  . TETANUS/TDAP  11/16/2021  . DEXA SCAN  Completed  .  COVID-19 Vaccine  Completed  . PNA vac Low Risk Adult  Completed  . HPV VACCINES  Aged Out    Health Maintenance  Health Maintenance Due  Topic Date Due  . Pneumococcal Vaccine 67-75 Years old (1 of 4 - PCV13) Never done  . Zoster Vaccines- Shingrix (1 of 2) Never done    Colorectal cancer screening: Type of screening: Colonoscopy. Completed 09/01/16. Repeat every 5 years  Mammogram status: Completed 08/08/20. Repeat every year  Bone Density status: Completed 02/20/21. Results reflect: Bone density results: OSTEOPOROSIS. Repeat every 2 years.  Lung Cancer Screening: (Low Dose CT Chest recommended if Age 17-80 years, 30 pack-year currently smoking OR have quit  w/in 15years.) does not qualify.   Additional Screening:  Hepatitis C Screening: does not qualify.   Vision Screening: Recommended annual ophthalmology exams for early detection of glaucoma and other disorders of the eye. Is the patient up to date with their annual eye exam?  Yes  Who is the provider or what is the name of the office in which the patient attends annual eye exams? [facility].   Dental Screening: Recommended annual dental exams for proper oral hygiene  Community Resource Referral / Chronic Care Management: CRR required this visit?  No   CCM required this visit?  No      Plan:     I have personally reviewed and noted the following in the patient's chart:   . Medical and social history . Use of alcohol, tobacco or illicit drugs  . Current medications and supplements including opioid prescriptions.  . Functional ability and status . Nutritional status . Physical activity . Advanced directives . List of other physicians . Hospitalizations, surgeries, and ER visits in previous 12 months . Vitals . Screenings to include cognitive, depression, and falls . Referrals and appointments  In addition, I have reviewed and discussed with patient certain preventive protocols, quality metrics, and best practice recommendations. A written personalized care plan for preventive services as well as general preventive health recommendations were provided to patient.     Clemetine Marker, LPN   01/21/487   Nurse Notes: none

## 2021-04-22 NOTE — Patient Instructions (Signed)
[name] , Thank you for taking time to come for your Medicare Wellness Visit. I appreciate your ongoing commitment to your health goals. Please review the following plan we discussed and let me know if I can assist you in the future.   Screening recommendations/referrals: Colonoscopy: done 09/01/16. Please discuss repeat screening with Dr. Ancil Boozer at your next visit.  Mammogram: done 08/08/20 Bone Density: done 02/20/21 Recommended yearly ophthalmology/optometry visit for glaucoma screening and checkup Recommended yearly dental visit for hygiene and checkup  Vaccinations: Influenza vaccine: done 08/26/20 Pneumococcal vaccine: done 03/01/17 Tdap vaccine: done 2013 Shingles vaccine: Shingrix discussed. Please contact your pharmacy for coverage information.  Covid-19: done 02/10/20, 03/01/20 & 10/27/20  Advanced directives: Please bring a copy of your health care power of attorney and living will to the office at your convenience.  Conditions/risks identified: Recommend drinking 6-8 glasses of water per day   Next appointment: Follow up in one year for your annual wellness visit    Preventive Care 65 Years and Older, Female Preventive care refers to lifestyle choices and visits with your health care provider that can promote health and wellness. What does preventive care include?  A yearly physical exam. This is also called an annual well check.  Dental exams once or twice a year.  Routine eye exams. Ask your health care provider how often you should have your eyes checked.  Personal lifestyle choices, including:  Daily care of your teeth and gums.  Regular physical activity.  Eating a healthy diet.  Avoiding tobacco and drug use.  Limiting alcohol use.  Practicing safe sex.  Taking low-dose aspirin every day.  Taking vitamin and mineral supplements as recommended by your health care provider. What happens during an annual well check? The services and screenings done  by your health care provider during your annual well check will depend on your age, overall health, lifestyle risk factors, and family history of disease. Counseling  Your health care provider may ask you questions about your:  Alcohol use.  Tobacco use.  Drug use.  Emotional well-being.  Home and relationship well-being.  Sexual activity.  Eating habits.  History of falls.  Memory and ability to understand (cognition).  Work and work Statistician.  Reproductive health. Screening  You may have the following tests or measurements:  Height, weight, and BMI.  Blood pressure.  Lipid and cholesterol levels. These may be checked every 5 years, or more frequently if you are over 43 years old.  Skin check.  Lung cancer screening. You may have this screening every year starting at age 29 if you have a 30-pack-year history of smoking and currently smoke or have quit within the past 15 years.  Fecal occult blood test (FOBT) of the stool. You may have this test every year starting at age 45.  Flexible sigmoidoscopy or colonoscopy. You may have a sigmoidoscopy every 5 years or a colonoscopy every 10 years starting at age 64.  Hepatitis C blood test.  Hepatitis B blood test.  Sexually transmitted disease (STD) testing.  Diabetes screening. This is done by checking your blood sugar (glucose) after you have not eaten for a while (fasting). You may have this done every 1-3 years.  Bone density scan. This is done to screen for osteoporosis. You may have this done starting at age 52.  Mammogram. This may be done every 1-2 years. Talk to your health care provider about how often you should have regular mammograms. Talk with your health care provider about  your test results, treatment options, and if necessary, the need for more tests. Vaccines  Your health care provider may recommend certain vaccines, such as:  Influenza vaccine. This is recommended every year.  Tetanus,  diphtheria, and acellular pertussis (Tdap, Td) vaccine. You may need a Td booster every 10 years.  Zoster vaccine. You may need this after age 88.  Pneumococcal 13-valent conjugate (PCV13) vaccine. One dose is recommended after age 73.  Pneumococcal polysaccharide (PPSV23) vaccine. One dose is recommended after age 85. Talk to your health care provider about which screenings and vaccines you need and how often you need them. This information is not intended to replace advice given to you by your health care provider. Make sure you discuss any questions you have with your health care provider. Document Released: 11/29/2015 Document Revised: 07/22/2016 Document Reviewed: 09/03/2015 Elsevier Interactive Patient Education  2017 [place] Prevention in the Home Falls can cause injuries. They can happen to people of all ages. There are many things you can do to make your home safe and to help prevent falls. What can I do on the outside of my home?  Regularly fix the edges of walkways and driveways and fix any cracks.  Remove anything that might make you trip as you walk through a door, such as a raised step or threshold.  Trim any bushes or trees on the path to your home.  Use bright outdoor lighting.  Clear any walking paths of anything that might make someone trip, such as rocks or tools.  Regularly check to see if handrails are loose or broken. Make sure that both sides of any steps have handrails.  Any raised decks and porches should have guardrails on the edges.  Have any leaves, snow, or ice cleared regularly.  Use sand or salt on walking paths during winter.  Clean up any spills in your garage right away. This includes oil or grease spills. What can I do in the bathroom?  Use night lights.  Install grab bars by the toilet and in the tub and shower. Do not use towel bars as grab bars.  Use non-skid mats or decals in the tub or shower.  If you need to sit down in  the shower, use a plastic, non-slip stool.  Keep the floor dry. Clean up any water that spills on the floor as soon as it happens.  Remove soap buildup in the tub or shower regularly.  Attach bath mats securely with double-sided non-slip rug tape.  Do not have throw rugs and other things on the floor that can make you trip. What can I do in the bedroom?  Use night lights.  Make sure that you have a light by your bed that is easy to reach.  Do not use any sheets or blankets that are too big for your bed. They should not hang down onto the floor.  Have a firm chair that has side arms. You can use this for support while you get dressed.  Do not have throw rugs and other things on the floor that can make you trip. What can I do in the kitchen?  Clean up any spills right away.  Avoid walking on wet floors.  Keep items that you use a lot in easy-to-reach places.  If you need to reach something above you, use a strong step stool that has a grab bar.  Keep electrical cords out of the way.  Do not use floor polish or wax  that makes floors slippery. If you must use wax, use non-skid floor wax.  Do not have throw rugs and other things on the floor that can make you trip. What can I do with my stairs?  Do not leave any items on the stairs.  Make sure that there are handrails on both sides of the stairs and use them. Fix handrails that are broken or loose. Make sure that handrails are as long as the stairways.  Check any carpeting to make sure that it is firmly attached to the stairs. Fix any carpet that is loose or worn.  Avoid having throw rugs at the top or bottom of the stairs. If you do have throw rugs, attach them to the floor with carpet tape.  Make sure that you have a light switch at the top of the stairs and the bottom of the stairs. If you do not have them, ask someone to add them for you. What else can I do to help prevent falls?  Wear shoes that:  Do not have high  heels.  Have rubber bottoms.  Are comfortable and fit you well.  Are closed at the toe. Do not wear sandals.  If you use a stepladder:  Make sure that it is fully opened. Do not climb a closed stepladder.  Make sure that both sides of the stepladder are locked into place.  Ask someone to hold it for you, if possible.  Clearly mark and make sure that you can see:  Any grab bars or handrails.  First and last steps.  Where the edge of each step is.  Use tools that help you move around (mobility aids) if they are needed. These include:  Canes.  Walkers.  Scooters.  Crutches.  Turn on the lights when you go into a dark area. Replace any light bulbs as soon as they burn out.  Set up your furniture so you have a clear path. Avoid moving your furniture around.  If any of your floors are uneven, fix them.  If there are any pets around you, be aware of where they are.  Review your medicines with your doctor. Some medicines can make you feel dizzy. This can increase your chance of falling. Ask your doctor what other things that you can do to help prevent falls. This information is not intended to replace advice given to you by your health care provider. Make sure you discuss any questions you have with your health care provider. Document Released: 08/29/2009 Document Revised: 04/09/2016 Document Reviewed: 12/07/2014 Elsevier Interactive Patient Education  2017 [employer].

## 2021-04-28 ENCOUNTER — Other Ambulatory Visit: Payer: Self-pay

## 2021-04-29 DIAGNOSIS — Z0100 Encounter for examination of eyes and vision without abnormal findings: Secondary | ICD-10-CM | POA: Diagnosis not present

## 2021-04-29 DIAGNOSIS — H401132 Primary open-angle glaucoma, bilateral, moderate stage: Secondary | ICD-10-CM | POA: Diagnosis not present

## 2021-05-16 ENCOUNTER — Telehealth: Payer: Self-pay | Admitting: Family Medicine

## 2021-05-16 NOTE — Telephone Encounter (Signed)
Ronalee Belts with [facility] or [facility] called asking If there is a Pilar Grammes with the office who had contacted Marjory Lies.  Ronalee Belts is actually the one she should contact.  This was in regards to accepting a referral for PT.  He said he felt she was a good pair for them.  CB#  [phone]

## 2021-05-16 NOTE — Telephone Encounter (Signed)
Called to inform Ronalee Belts that we do not have a linda walters here.

## 2021-05-29 NOTE — Progress Notes (Signed)
Name: [name]   MRN: [id number]    DOB: 05-22-1941   Date:05/30/2021       Progress Note  Subjective  Chief Complaint  Follow Up  HPI  History of fracture of left hip : she fell at home, missed one of her garage steps on 01/27/2021, she called 911 and was transported to [facility], she had Total left hip replacement surgery on 01/28/2021 by Dr. Rudene Christians. She is back to work as a sub in [employer]  Left flank pain: she has noticed pain on right flank area, she states symptoms started when she went back to work in June, she states pain is triggered by movement, Tylenol and ice pack seems to help. Not associated with nausea or vomiting . She states triggered by movement. She denies change in bowel movements , hematuria or increase in urinary frequency. No fever or chills.   HTN: she has been off Losartan since hip replacement surgery back in Feb 08, 2021 and bp has been at goal, no chest pain, palpitation or sob    Hyperlipidemia: taking pravastatin daily now and we will recheck labs, no myalgia.    Depression Major Chronic: she states doing well, still misses her son , that died at age 33 from [place] back in 2013/02/08. Her husband died 6 years before , on the same day on October 14 th. She has two grandchildren from him and two other grandchildren from her youngest son. She states she really struggles from October until the end of December, she states in January she started to feel better. She does not want to take anything daily but willing to take something prn. I gave her Buspar to take prn in the past but she states she did not like the side effects so she stopped. Discussed grieve counseling through hospice . Still not ready for counseling    GERD: she has stopped omeprazole but is now on Pepcid prn and is doing well , she needs refills today    Hyperglycemia: A1C was 5.8% but last visit was 5.4%  Denies polyphagia, polydipsia or polyuria. She has episodes of hypoglycemia and family history  of diabetes, she gets light headed when she skips meals.    Senile purpura: on both arms, patient was given reassurance again   Osteoporosis: she states she was given Boniva during hospital stay but she states it caused indigestion and also bone pain, it has happened in the past also, she has a referral to see Endocrionologist and appointment was scheduled for August   Patient Active Problem List   Diagnosis Date Noted   Hypokalemia    Hypotension    Impaired fasting glucose    Closed hip fracture requiring operative repair with routine healing, left 01/27/2021   Fall at home, initial encounter 01/27/2021   Overweight (BMI 25.0-29.9) 04/03/2020   S/P right TKA 04/02/2020   Status post total knee replacement, right 04/02/2020   Presence of right artificial knee joint 04/02/2020   Pain in right knee 02/23/2020   Senile purpura ([place]) 12/30/2018   S/P vaginal hysterectomy 05/30/2018   Recurrent major depressive disorder, in full remission ([place]) 03/02/2018   GERD (gastroesophageal reflux disease) 10/20/2016   Anxiety, generalized 01/28/2016   Osteoarthritis of right knee 12/25/2015   Depression 09/05/2015   Abnormal brain CT 06/20/2015   Anxiety, mild 06/20/2015   Brain lesion 06/20/2015   Clinical depression 06/20/2015   Dyslipidemia 06/20/2015   Vertigo 06/20/2015   Hyperlipidemia 06/20/2015   Benign hypertension 06/20/2015  Affective disorder, major 06/20/2015   Arthralgia of shoulder 06/20/2015   Glaucoma 06/20/2015   Post-menopausal 06/04/2015   Bilateral leg weakness 06/04/2015    Past Surgical History:  Procedure Laterality Date   ANAL FISSURE REPAIR  1970s   for bleeding in 1970s   ANTERIOR AND POSTERIOR REPAIR WITH SACROSPINOUS FIXATION N/A 05/30/2018   Procedure: ANTERIOR AND POSTERIOR REPAIR;  Surgeon: Louretta Shorten, MD;  Location: [facility];  Service: Gynecology;  Laterality: N/A;   COLONOSCOPY  2012   [facility]   COLONOSCOPY WITH PROPOFOL N/A 09/01/2016   Procedure:  COLONOSCOPY WITH PROPOFOL;  Surgeon: Robert Bellow, MD;  Location: [facility]COPY;  Service: Endoscopy;  Laterality: N/A;   EYE SURGERY Bilateral    cataracts removed   KNEE SURGERY Left    SALPINGOOPHORECTOMY Right 05/30/2018   Procedure: SALPINGO OOPHORECTOMY;  Surgeon: Louretta Shorten, MD;  Location: [facility];  Service: Gynecology;  Laterality: Right;   SUBCLAVIAN ANGIOGRAM     TOTAL HIP ARTHROPLASTY Left 01/28/2021   Procedure: TOTAL HIP ARTHROPLASTY ANTERIOR APPROACH;  Surgeon: Hessie Knows, MD;  Location: [facility];  Service: Orthopedics;  Laterality: Left;   TOTAL KNEE ARTHROPLASTY Right 04/02/2020   Procedure: TOTAL KNEE ARTHROPLASTY;  Surgeon: Paralee Cancel, MD;  Location: [facility];  Service: Orthopedics;  Laterality: Right;  70 mins   UPPER GI ENDOSCOPY  2012   VAGINAL HYSTERECTOMY N/A 05/30/2018   Procedure: HYSTERECTOMY VAGINAL;  Surgeon: Louretta Shorten, MD;  Location: [facility];  Service: Gynecology;  Laterality: N/A;   WISDOM TOOTH EXTRACTION      Family History  Problem Relation Age of Onset   Cancer Mother 67       colon   Alzheimer's disease Mother    Hypertension Mother    Cancer Brother        prostate   Lymphoma Son    Cancer Son 71       lymphoma   Hypertension Father    Cerebral aneurysm Father    Diabetes Sister    Depression Sister    Hypertension Sister    Depression Sister    Cancer Brother    Gallbladder disease Brother    Prostate cancer Brother    Breast cancer Neg Hx     Social History   Tobacco Use   Smoking status: Never   Smokeless tobacco: Never   Tobacco comments:    smoking cessation materials not required  Substance Use Topics   Alcohol use: Yes    Alcohol/week: 0.0 standard drinks    Comment: occasional beer/mixed drink     Current Outpatient Medications:    acetaminophen (TYLENOL) 500 MG tablet, Take 500 mg by mouth every 6 (six) hours as needed., Disp: , Rfl:    ALPHAGAN P 0.1 % SOLN, Place 1 drop into both eyes 3 (three) times daily.  , Disp: , Rfl:    bimatoprost (LUMIGAN) 0.01 % SOLN, Place 1 drop into both eyes at bedtime., Disp: , Rfl:    fluticasone (FLONASE) 50 MCG/ACT nasal spray, SPRAY 2 SPRAYS INTO EACH NOSTRIL EVERY DAY (Patient taking differently: Place 2 sprays into both nostrils daily.), Disp: 48 mL, Rfl: 2   Polyethyl Glycol-Propyl Glycol 0.4-0.3 % SOLN, Apply to eye., Disp: , Rfl:    pravastatin (PRAVACHOL) 20 MG tablet, Take 1 tablet (20 mg total) by mouth daily., Disp: 90 tablet, Rfl: 1   PREMARIN vaginal cream, Place 1 application vaginally 2 (two) times a week., Disp: , Rfl: 12   White Petrolatum-Mineral Oil (  SYSTANE NIGHTTIME) OINT, Place 1 application into both eyes at bedtime as needed (dry eyes)., Disp: , Rfl:   Allergies  Allergen Reactions   Biphosphate    Codeine     headaches   Combigan [Brimonidine Tartrate-Timolol]     drowsy   Sulfa Antibiotics Nausea And Vomiting and Nausea Only   Metronidazole Itching and Rash    I personally reviewed active problem list, medication list, allergies, family history, social history, health maintenance with the patient/caregiver today.   ROS  Constitutional: Negative for fever or weight change.  Respiratory: Negative for cough and shortness of breath.   Cardiovascular: Negative for chest pain or palpitations.  Gastrointestinal: Positive  for abdominal pain, no bowel changes.  Musculoskeletal: Negative for gait problem or joint swelling.  Skin: Negative for rash.  Neurological: Negative for dizziness or headache.  No other specific complaints in a complete review of systems (except as listed in HPI above).   Objective  Vitals:   05/30/21 1057  BP: 132/72  Pulse: 94  Resp: 16  Temp: 98.1 F (36.7 C)  TempSrc: Oral  SpO2: 95%  Weight: 146 lb (66.2 kg)  Height: 5\' 2"  (1.575 m)    Body mass index is 26.7 kg/m.  Physical Exam  Constitutional: Patient appears well-developed and well-nourished. No distress.  HEENT: head atraumatic,  normocephalic, pupils equal and reactive to light, neck supple, throat within normal limits Cardiovascular: Normal rate, regular rhythm and normal heart sounds.  No murmur heard. No BLE edema. Pulmonary/Chest: Effort normal and breath sounds normal. No respiratory distress. Abdominal: Soft.  She has some pain during palpation of RUQ and flank area, pain aggravated by right lateral bending and right lateral rotation, no pain during palpation of lumbar or thoracic spine, no rashes . No guarding or rebound  Psychiatric: Patient has a normal mood and affect. behavior is normal. Judgment and thought content normal.    PHQ2/9: Depression screen [facility] 2/9 05/30/2021 04/22/2021 02/20/2021 11/04/2020 08/26/2020  Decreased Interest 0 0 0 0 0  Down, Depressed, Hopeless 0 1 0 1 2  PHQ - 2 Score 0 1 0 1 2  Altered sleeping 0 0 0 0 0  Tired, decreased energy 0 1 3 2 2   Change in appetite 0 0 0 0 0  Feeling bad or failure about yourself  0 0 0 0 0  Trouble concentrating 0 0 0 0 1  Moving slowly or fidgety/restless 0 0 0 0 0  Suicidal thoughts 0 0 0 0 0  PHQ-9 Score 0 2 3 3 5   Difficult doing work/chores - Not difficult at all - Not difficult at all Not difficult at all  Some recent data might be hidden    phq 9 is negative   Fall Risk: Fall Risk  05/30/2021 04/22/2021 02/20/2021 11/04/2020 08/26/2020  Falls in the past year? 1 1 1  0 0  Comment - - - - -  Number falls in past yr: 0 0 0 0 0  Injury with Fall? 1 1 0 0 0  Risk for fall due to : - History of fall(s) - - -  Risk for fall due to: Comment - - - - -  Follow up - Falls prevention discussed - - -     Functional Status Survey: Is the patient deaf or have difficulty hearing?: No Does the patient have difficulty seeing, even when wearing glasses/contacts?: No Does the patient have difficulty concentrating, remembering, or making decisions?: No Does the patient have difficulty walking  or climbing stairs?: No Does the patient have difficulty  dressing or bathing?: No Does the patient have difficulty doing errands alone such as visiting a doctor's office or shopping?: No    Assessment & Plan  1. Age-related osteoporosis without current pathological fracture  Going to see Endo soon   2. Gastroesophageal reflux disease without esophagitis   3. Dyslipidemia  - Lipid panel  4. History of total left hip replacement   5. Essential hypertension  At goal   6. Primary osteoarthritis of right knee   7. Senile purpura ([facility])  Stable   8. History of non anemic vitamin B12 deficiency  - Vitamin B12  9. Anemia due to blood loss  Hemoglobin was low during her hospital stay and we will recheck it today  - CBC with Differential/Platelet - Iron, TIBC and Ferritin Panel  10. Right flank pain   It seems to be muscular, but discussed need to contact me sooner if needed. She states pain has resolved

## 2021-05-30 ENCOUNTER — Other Ambulatory Visit: Payer: Self-pay

## 2021-05-30 ENCOUNTER — Ambulatory Visit (INDEPENDENT_AMBULATORY_CARE_PROVIDER_SITE_OTHER): Payer: Medicare HMO | Admitting: Family Medicine

## 2021-05-30 ENCOUNTER — Encounter: Payer: Self-pay | Admitting: Family Medicine

## 2021-05-30 VITALS — BP 132/72 | HR 94 | Temp 98.1°F | Resp 16 | Ht 62.0 in | Wt 146.0 lb

## 2021-05-30 DIAGNOSIS — I10 Essential (primary) hypertension: Secondary | ICD-10-CM | POA: Diagnosis not present

## 2021-05-30 DIAGNOSIS — Z8639 Personal history of other endocrine, nutritional and metabolic disease: Secondary | ICD-10-CM | POA: Diagnosis not present

## 2021-05-30 DIAGNOSIS — D692 Other nonthrombocytopenic purpura: Secondary | ICD-10-CM

## 2021-05-30 DIAGNOSIS — K219 Gastro-esophageal reflux disease without esophagitis: Secondary | ICD-10-CM

## 2021-05-30 DIAGNOSIS — Z96642 Presence of left artificial hip joint: Secondary | ICD-10-CM | POA: Diagnosis not present

## 2021-05-30 DIAGNOSIS — D500 Iron deficiency anemia secondary to blood loss (chronic): Secondary | ICD-10-CM

## 2021-05-30 DIAGNOSIS — R10A1 Flank pain, right side: Secondary | ICD-10-CM

## 2021-05-30 DIAGNOSIS — M1711 Unilateral primary osteoarthritis, right knee: Secondary | ICD-10-CM

## 2021-05-30 DIAGNOSIS — E785 Hyperlipidemia, unspecified: Secondary | ICD-10-CM

## 2021-05-30 DIAGNOSIS — R109 Unspecified abdominal pain: Secondary | ICD-10-CM

## 2021-05-30 DIAGNOSIS — M810 Age-related osteoporosis without current pathological fracture: Secondary | ICD-10-CM

## 2021-05-31 LAB — VITAMIN B12: Vitamin B-12: 292 pg/mL (ref 200–1100)

## 2021-05-31 LAB — LIPID PANEL
Cholesterol: 161 mg/dL (ref <200)
HDL: 77 mg/dL (ref >=50)
LDL Cholesterol (Calc): 64 mg/dL (calc)
Non-HDL Cholesterol (Calc): 84 mg/dL (calc) (ref <130)
Total CHOL/HDL Ratio: 2.1 (calc) (ref <5.0)
Triglycerides: 114 mg/dL (ref <150)

## 2021-05-31 LAB — CBC WITH DIFFERENTIAL/PLATELET
Absolute Monocytes: 271 cells/uL (ref 200–950)
Basophils Absolute: 12 cells/uL (ref 0–200)
Basophils Relative: 0.2 %
Eosinophils Absolute: 77 cells/uL (ref 15–500)
Eosinophils Relative: 1.3 %
HCT: 40.1 % (ref 35.0–45.0)
Hemoglobin: 12.9 g/dL (ref 11.7–15.5)
Lymphs Abs: 1741 cells/uL (ref 850–3900)
MCH: 27 pg (ref 27.0–33.0)
MCHC: 32.2 g/dL (ref 32.0–36.0)
MCV: 84.1 fL (ref 80.0–100.0)
MPV: 10.3 fL (ref 7.5–12.5)
Monocytes Relative: 4.6 %
Neutro Abs: 3800 cells/uL (ref 1500–7800)
Neutrophils Relative %: 64.4 %
Platelets: 251 10*3/uL (ref 140–400)
RBC: 4.77 10*6/uL (ref 3.80–5.10)
RDW: 14.1 % (ref 11.0–15.0)
Total Lymphocyte: 29.5 %
WBC: 5.9 10*3/uL (ref 3.8–10.8)

## 2021-05-31 LAB — IRON,TIBC AND FERRITIN PANEL
%SAT: 23 % (calc) (ref 16–45)
Ferritin: 38 ng/mL (ref 16–288)
Iron: 88 ug/dL (ref 45–160)
TIBC: 385 mcg/dL (calc) (ref 250–450)

## 2021-06-02 NOTE — Progress Notes (Signed)
Pt agreed to come in for monthly injections

## 2021-06-04 ENCOUNTER — Ambulatory Visit (INDEPENDENT_AMBULATORY_CARE_PROVIDER_SITE_OTHER): Payer: Medicare HMO

## 2021-06-04 DIAGNOSIS — Z8639 Personal history of other endocrine, nutritional and metabolic disease: Secondary | ICD-10-CM

## 2021-06-04 MED ORDER — CYANOCOBALAMIN 1000 MCG/ML IJ SOLN
1000.0000 ug | Freq: Once | INTRAMUSCULAR | Status: AC
  Administered 2021-06-04: 1000 ug via INTRAMUSCULAR

## 2021-07-02 ENCOUNTER — Ambulatory Visit (INDEPENDENT_AMBULATORY_CARE_PROVIDER_SITE_OTHER): Payer: Medicare HMO

## 2021-07-02 ENCOUNTER — Other Ambulatory Visit: Payer: Self-pay

## 2021-07-02 DIAGNOSIS — E538 Deficiency of other specified B group vitamins: Secondary | ICD-10-CM | POA: Diagnosis not present

## 2021-07-02 MED ORDER — CYANOCOBALAMIN 1000 MCG/ML IJ SOLN
1000.0000 ug | Freq: Once | INTRAMUSCULAR | Status: AC
Start: 2021-07-02 — End: 2021-07-02
  Administered 2021-07-02: 1000 ug via INTRAMUSCULAR

## 2021-07-22 NOTE — Progress Notes (Signed)
Name: [name]   MRN: [id number]    DOB: 1941/03/31   Date:07/23/2021       Progress Note  Subjective  Chief Complaint  Follow Up  HPI  B12 Deficiency: taking monthly injections, she had two shots but states still feeling very tired, she states she feels exhausted at the end of the day, she is able to do her work but has to rest when she gets home from work. She stands most of the day at [employer]. Denies SOB, just feels tired.   Vaginal infection: she states last year she went to Urgent care because she had some vaginal discharge and thought it was an yeast infection, the test showed was something else and it was transmitted by her partner. She does not recall the name of the infection but she states she was treated but had intercourse again afterwards with the same partner. No symptoms at this time. Her partner died in December 28, 2022 but she would like to know if she still has the infection  Patient Active Problem List   Diagnosis Date Noted   Hypotension    Impaired fasting glucose    Closed hip fracture requiring operative repair with routine healing, left 01/27/2021   Fall at home, initial encounter 01/27/2021   Overweight (BMI 25.0-29.9) 04/03/2020   S/P right TKA 04/02/2020   Status post total knee replacement, right 04/02/2020   Presence of right artificial knee joint 04/02/2020   Pain in right knee 02/23/2020   Senile purpura ([place]) 12/30/2018   S/P vaginal hysterectomy 05/30/2018   Recurrent major depressive disorder, in full remission ([place]) 03/02/2018   GERD (gastroesophageal reflux disease) 10/20/2016   Anxiety, generalized 01/28/2016   Osteoarthritis of right knee 12/25/2015   Depression 09/05/2015   Abnormal brain CT 06/20/2015   Anxiety, mild 06/20/2015   Brain lesion 06/20/2015   Clinical depression 06/20/2015   Dyslipidemia 06/20/2015   Vertigo 06/20/2015   Hyperlipidemia 06/20/2015   Benign hypertension 06/20/2015   Affective disorder, major 06/20/2015    Arthralgia of shoulder 06/20/2015   Glaucoma 06/20/2015   Post-menopausal 06/04/2015   Bilateral leg weakness 06/04/2015    Past Surgical History:  Procedure Laterality Date   ANAL FISSURE REPAIR  1970s   for bleeding in [place] WITH SACROSPINOUS FIXATION N/A 05/30/2018   Procedure: ANTERIOR AND POSTERIOR REPAIR;  Surgeon: Louretta Shorten, MD;  Location: [facility];  Service: Gynecology;  Laterality: N/A;   COLONOSCOPY  2012   [facility]   COLONOSCOPY WITH PROPOFOL N/A 09/01/2016   Procedure: COLONOSCOPY WITH PROPOFOL;  Surgeon: Robert Bellow, MD;  Location: [facility] ENDOSCOPY;  Service: Endoscopy;  Laterality: N/A;   EYE SURGERY Bilateral    cataracts removed   KNEE SURGERY Left    SALPINGOOPHORECTOMY Right 05/30/2018   Procedure: SALPINGO OOPHORECTOMY;  Surgeon: Louretta Shorten, MD;  Location: [facility];  Service: Gynecology;  Laterality: Right;   SUBCLAVIAN ANGIOGRAM     TOTAL HIP ARTHROPLASTY Left 01/28/2021   Procedure: TOTAL HIP ARTHROPLASTY ANTERIOR APPROACH;  Surgeon: Hessie Knows, MD;  Location: [facility];  Service: Orthopedics;  Laterality: Left;   TOTAL KNEE ARTHROPLASTY Right 04/02/2020   Procedure: TOTAL KNEE ARTHROPLASTY;  Surgeon: Paralee Cancel, MD;  Location: [facility];  Service: Orthopedics;  Laterality: Right;  70 mins   UPPER GI ENDOSCOPY  2012   VAGINAL HYSTERECTOMY N/A 05/30/2018   Procedure: HYSTERECTOMY VAGINAL;  Surgeon: Louretta Shorten, MD;  Location: [facility];  Service: Gynecology;  Laterality: N/A;  WISDOM TOOTH EXTRACTION      Family History  Problem Relation Age of Onset   Cancer Mother 12       colon   Alzheimer's disease Mother    Hypertension Mother    Cancer Brother        prostate   Lymphoma Son    Cancer Son 30       lymphoma   Hypertension Father    Cerebral aneurysm Father    Diabetes Sister    Depression Sister    Hypertension Sister    Depression Sister    Cancer Brother    Gallbladder disease Brother    Prostate cancer  Brother    Breast cancer Neg Hx     Social History   Tobacco Use   Smoking status: Never   Smokeless tobacco: Never   Tobacco comments:    smoking cessation materials not required  Substance Use Topics   Alcohol use: Yes    Alcohol/week: 0.0 standard drinks    Comment: occasional beer/mixed drink     Current Outpatient Medications:    acetaminophen (TYLENOL) 500 MG tablet, Take 500 mg by mouth every 6 (six) hours as needed., Disp: , Rfl:    ALPHAGAN P 0.1 % SOLN, Place 1 drop into both eyes 3 (three) times daily. , Disp: , Rfl:    bimatoprost (LUMIGAN) 0.01 % SOLN, Place 1 drop into both eyes at bedtime., Disp: , Rfl:    fluticasone (FLONASE) 50 MCG/ACT nasal spray, SPRAY 2 SPRAYS INTO EACH NOSTRIL EVERY DAY, Disp: 48 mL, Rfl: 2   Polyethyl Glycol-Propyl Glycol 0.4-0.3 % SOLN, Apply to eye., Disp: , Rfl:    pravastatin (PRAVACHOL) 20 MG tablet, Take 1 tablet (20 mg total) by mouth daily., Disp: 90 tablet, Rfl: 1   PREMARIN vaginal cream, Place 1 application vaginally 2 (two) times a week., Disp: , Rfl: 12   White Petrolatum-Mineral Oil (SYSTANE NIGHTTIME) OINT, Place 1 application into both eyes at bedtime as needed (dry eyes)., Disp: , Rfl:   Allergies  Allergen Reactions   Biphosphate    Codeine     headaches   Combigan [Brimonidine Tartrate-Timolol]     drowsy   Sulfa Antibiotics Nausea And Vomiting and Nausea Only   Metronidazole Itching and Rash    I personally reviewed active problem list, medication list, allergies, family history, social history, health maintenance with the patient/caregiver today.   ROS  Ten systems reviewed and is negative except as mentioned in HPI   Objective  Vitals:   07/23/21 1147  BP: 134/82  Pulse: 87  Resp: 16  Temp: 98.2 F (36.8 C)  SpO2: 96%  Weight: 149 lb (67.6 kg)  Height: '5\' 2"'$  (1.575 m)    Body mass index is 27.25 kg/m.  Physical Exam  Constitutional: Patient appears well-developed and well-nourished.  Overweight.  No distress.  HEENT: head atraumatic, normocephalic, pupils equal and reactive to light, neck supple Cardiovascular: Normal rate, regular rhythm and normal heart sounds.  No murmur heard. No BLE edema. Pulmonary/Chest: Effort normal and breath sounds normal. No respiratory distress. Abdominal: Soft.  There is no tenderness. Psychiatric: Patient seems anxious.  Judgment and thought content normal.   Recent Results (from the past 2160 hour(s))  CBC with Differential/Platelet     Status: None   Collection Time: 05/30/21 11:52 AM  Result Value Ref Range   WBC 5.9 3.8 - 10.8 Thousand/uL   RBC 4.77 3.80 - 5.10 Million/uL   Hemoglobin 12.9 11.7 -  15.5 g/dL   HCT 40.1 35.0 - 45.0 %   MCV 84.1 80.0 - 100.0 fL   MCH 27.0 27.0 - 33.0 pg   MCHC 32.2 32.0 - 36.0 g/dL   RDW 14.1 11.0 - 15.0 %   Platelets 251 140 - 400 Thousand/uL   MPV 10.3 7.5 - 12.5 fL   Neutro Abs 3,800 1,500 - 7,800 cells/uL   Lymphs Abs 1,741 850 - 3,900 cells/uL   Absolute Monocytes 271 200 - 950 cells/uL   Eosinophils Absolute 77 15 - 500 cells/uL   Basophils Absolute 12 0 - 200 cells/uL   Neutrophils Relative % 64.4 %   Total Lymphocyte 29.5 %   Monocytes Relative 4.6 %   Eosinophils Relative 1.3 %   Basophils Relative 0.2 %  Iron, TIBC and Ferritin Panel     Status: None   Collection Time: 05/30/21 11:52 AM  Result Value Ref Range   Iron 88 45 - 160 mcg/dL   TIBC 385 250 - 450 mcg/dL (calc)   %SAT 23 16 - 45 % (calc)   Ferritin 38 16 - 288 ng/mL  Vitamin B12     Status: None   Collection Time: 05/30/21 11:52 AM  Result Value Ref Range   Vitamin B-12 292 200 - 1,100 pg/mL    Comment: . Please Note: Although the reference range for vitamin B12 is [phone] pg/mL, it has been reported that between 5 and 10% of patients with values between 200 and 400 pg/mL may experience neuropsychiatric and hematologic abnormalities due to occult B12 deficiency; less than 1% of patients with values above 400 pg/mL  will have symptoms. .   Lipid panel     Status: None   Collection Time: 05/30/21 11:52 AM  Result Value Ref Range   Cholesterol 161 <200 mg/dL   HDL 77 > OR = 50 mg/dL   Triglycerides 114 <150 mg/dL   LDL Cholesterol (Calc) 64 mg/dL (calc)    Comment: Reference range: <100 . Desirable range <100 mg/dL for primary prevention;   <70 mg/dL for patients with CHD or diabetic patients  with > or = 2 CHD risk factors. Marland Kitchen LDL-C is now calculated using the Martin-Hopkins  calculation, which is a validated novel method providing  better accuracy than the Friedewald equation in the  estimation of LDL-C.  Cresenciano Genre et al. Annamaria Helling. [id number]): 2061-2068  (http://education.[employer].com/faq/FAQ164)    Total CHOL/HDL Ratio 2.1 <5.0 (calc)   Non-HDL Cholesterol (Calc) 84 <130 mg/dL (calc)    Comment: For patients with diabetes plus 1 major ASCVD risk  factor, treating to a non-HDL-C goal of <100 mg/dL  (LDL-C of <70 mg/dL) is considered a therapeutic  option.      PHQ2/9: Depression screen [facility] 2/9 07/23/2021 05/30/2021 04/22/2021 02/20/2021 11/04/2020  Decreased Interest 0 0 0 0 0  Down, Depressed, Hopeless 0 0 1 0 1  PHQ - 2 Score 0 0 1 0 1  Altered sleeping - 0 0 0 0  Tired, decreased energy - 0 '1 3 2  '$ Change in appetite - 0 0 0 0  Feeling bad or failure about yourself  - 0 0 0 0  Trouble concentrating - 0 0 0 0  Moving slowly or fidgety/restless - 0 0 0 0  Suicidal thoughts - 0 0 0 0  PHQ-9 Score - 0 '2 3 3  '$ Difficult doing work/chores - - Not difficult at all - Not difficult at all  Some recent data might be hidden  phq 9 is negative   Fall Risk: Fall Risk  07/23/2021 05/30/2021 04/22/2021 02/20/2021 11/04/2020  Falls in the past year? '1 1 1 1 '$ 0  Comment - - - - -  Number falls in past yr: 0 0 0 0 0  Injury with Fall? 0 1 1 0 0  Risk for fall due to : - - History of fall(s) - -  Risk for fall due to: Comment - - - - -  Follow up - - Falls prevention discussed - -       Functional Status Survey: Is the patient deaf or have difficulty hearing?: No Does the patient have difficulty seeing, even when wearing glasses/contacts?: No Does the patient have difficulty concentrating, remembering, or making decisions?: No Does the patient have difficulty walking or climbing stairs?: No Does the patient have difficulty dressing or bathing?: No Does the patient have difficulty doing errands alone such as visiting a doctor's office or shopping?: No    Assessment & Plan  1. Need for immunization against influenza  - Flu Vaccine QUAD High Dose(Fluad)  2. B12 deficiency  - B12 and Folate Panel   3. Family history of colon cancer in mother  - Ambulatory referral to General Surgery   4. Routine screening for STI (sexually transmitted infection)  - Cervicovaginal ancillary only

## 2021-07-23 ENCOUNTER — Ambulatory Visit (INDEPENDENT_AMBULATORY_CARE_PROVIDER_SITE_OTHER): Payer: Medicare HMO | Admitting: Family Medicine

## 2021-07-23 ENCOUNTER — Other Ambulatory Visit (HOSPITAL_COMMUNITY)
Admission: RE | Admit: 2021-07-23 | Discharge: 2021-07-23 | Disposition: A | Discharge status: Home / Self Care | Payer: Medicare HMO | Source: Ambulatory Visit | Attending: Family Medicine | Admitting: Family Medicine

## 2021-07-23 ENCOUNTER — Encounter: Payer: Self-pay | Admitting: Family Medicine

## 2021-07-23 ENCOUNTER — Other Ambulatory Visit: Payer: Self-pay

## 2021-07-23 VITALS — BP 134/82 | HR 87 | Temp 98.2°F | Resp 16 | Ht 62.0 in | Wt 149.0 lb

## 2021-07-23 DIAGNOSIS — Z113 Encounter for screening for infections with a predominantly sexual mode of transmission: Secondary | ICD-10-CM | POA: Insufficient documentation

## 2021-07-23 DIAGNOSIS — E538 Deficiency of other specified B group vitamins: Secondary | ICD-10-CM

## 2021-07-23 DIAGNOSIS — Z23 Encounter for immunization: Secondary | ICD-10-CM | POA: Diagnosis not present

## 2021-07-23 DIAGNOSIS — Z800 Family history of malignant neoplasm of digestive organs: Secondary | ICD-10-CM

## 2021-07-23 LAB — B12 AND FOLATE PANEL
Folate: 13.1 ng/mL
Vitamin B-12: 404 pg/mL (ref 200–1100)

## 2021-07-24 ENCOUNTER — Telehealth: Payer: Self-pay | Admitting: *Deleted

## 2021-07-24 LAB — CERVICOVAGINAL ANCILLARY ONLY
Bacterial Vaginitis (gardnerella): POSITIVE — AB
Candida Glabrata: NEGATIVE
Candida Vaginitis: NEGATIVE
Chlamydia: NEGATIVE
Comment: NEGATIVE
Comment: NEGATIVE
Comment: NEGATIVE
Comment: NEGATIVE
Comment: NEGATIVE
Comment: NORMAL
Neisseria Gonorrhea: NEGATIVE
Trichomonas: NEGATIVE

## 2021-07-24 NOTE — Chronic Care Management (AMB) (Signed)
  Chronic Care Management   Outreach Note  07/24/2021 Name: [name] MRN: [id number] DOB: 1941/08/25  [name] is a 80 y.o. year old female who is a primary care patient of Steele Sizer, MD. I reached out to [name] by phone today in response to a referral sent by [name]'s PCP Steele Sizer, MD     An unsuccessful telephone outreach was attempted today. The patient was referred to the case management team for assistance with care management and care coordination.   Follow Up Plan: A HIPAA compliant phone message was left for the patient providing contact information and requesting a return call.  If patient returns call to provider office, please advise to call Embedded Care Management Care Guide [name] at [place], [place] [facility]  Direct Dial: [phone]

## 2021-07-25 ENCOUNTER — Telehealth: Payer: Self-pay

## 2021-07-25 NOTE — Telephone Encounter (Signed)
Called patient, left voicemail for return call.

## 2021-07-25 NOTE — Telephone Encounter (Signed)
Copied from [place] [phone]. Topic: General - Call Back - No Documentation >> Jul 25, 2021 10:14 AM [name] wrote: Reason for CRM: Pt is calling back requesting lab results

## 2021-07-28 ENCOUNTER — Ambulatory Visit (INDEPENDENT_AMBULATORY_CARE_PROVIDER_SITE_OTHER): Payer: Medicare HMO

## 2021-07-28 ENCOUNTER — Other Ambulatory Visit: Payer: Self-pay

## 2021-07-28 DIAGNOSIS — E538 Deficiency of other specified B group vitamins: Secondary | ICD-10-CM | POA: Diagnosis not present

## 2021-07-28 MED ORDER — CYANOCOBALAMIN 1000 MCG/ML IJ SOLN
1000.0000 ug | Freq: Once | INTRAMUSCULAR | Status: AC
  Administered 2021-07-28: 1000 ug via INTRAMUSCULAR

## 2021-07-30 NOTE — Chronic Care Management (AMB) (Signed)
  Chronic Care Management   Outreach Note  07/30/2021 Name: SAMARYA KOVACK MRN: [id number] DOB: October 13, 1941  Khristi Wagley Laurie is a 80 y.o. year old female who is a primary care patient of Steele Sizer, MD. I reached out to [place] by phone today in response to a referral sent by Ms. Rande Brunt Selsor's PCP Steele Sizer, MD     A second unsuccessful telephone outreach was attempted today. The patient was referred to the case management team for assistance with care management and care coordination.   Follow Up Plan: A HIPAA compliant phone message was left for the patient providing contact information and requesting a return call.  If patient returns call to provider office, please advise to call Embedded Care Management Care Guide Keana Dueitt at [place], [place] [facility]  Direct Dial: [phone]

## 2021-07-30 NOTE — Chronic Care Management (AMB) (Signed)
  Chronic Care Management   Note  07/30/2021 Name: HARSHITA BERNALES MRN: [id number] DOB: 1941/06/17  Tichina Koebel [place] is a 80 y.o. year old female who is a primary care patient of Steele Sizer, MD. I reached out to [place] by phone today in response to a referral sent by Ms. Rande Brunt Steuck's PCP Steele Sizer, MD     Ms. Olivos was given information about Chronic Care Management services today including:  CCM service includes personalized support from designated clinical staff supervised by her physician, including individualized plan of care and coordination with other care providers 24/7 contact phone numbers for assistance for urgent and routine care needs. Service will only be billed when office clinical staff spend 20 minutes or more in a month to coordinate care. Only one practitioner may furnish and bill the service in a calendar month. The patient may stop CCM services at any time (effective at the end of the month) by phone call to the office staff. The patient will be responsible for cost sharing (co-pay) of up to 20% of the service fee (after annual deductible is met).  Patient agreed to services and verbal consent obtained.   Follow up plan: Telephone appointment with care management team member scheduled for: 08/06/2021  Julian [place], [place] [facility]  Direct Dial: [phone]

## 2021-08-05 ENCOUNTER — Telehealth: Payer: Self-pay

## 2021-08-05 NOTE — Telephone Encounter (Signed)
Pt is due for her colonoscopy. Pt notified and she wants to continue seeing Dr. Bary Castilla. She will call him to set up appt.

## 2021-08-06 ENCOUNTER — Ambulatory Visit (INDEPENDENT_AMBULATORY_CARE_PROVIDER_SITE_OTHER): Payer: Medicare HMO

## 2021-08-06 DIAGNOSIS — F339 Major depressive disorder, recurrent, unspecified: Secondary | ICD-10-CM

## 2021-08-06 DIAGNOSIS — Z9181 History of falling: Secondary | ICD-10-CM

## 2021-08-06 DIAGNOSIS — E785 Hyperlipidemia, unspecified: Secondary | ICD-10-CM

## 2021-08-06 NOTE — Patient Instructions (Addendum)
Thank you for allowing the Chronic Care Management team to participate in your care.    Patient Care Plan: Dyslipidemia     Problem Identified: Dyslipidemia      Long-Range Goal: Dyslipidemia Monitored   Start Date: 08/06/2021  Expected End Date: 12/04/2021  Priority: Medium  Note:    Current Barriers:  Chronic Disease Management support and educational needs related to Dyslipidemia.  Case Manager Clinical Goal(s):  Over the next 120 days, patient will demonstrate improved adherence to prescribed treatment plan as evidenced by taking all medications as prescribed and adhering to cardiac prudent/heart healthy diet.  Interventions:  Collaboration with Catherine Sizer, MD regarding development and update of comprehensive plan of care as evidenced by provider attestation and co-signature Inter-disciplinary care team collaboration (see longitudinal plan of care) Discussed medications and importance of compliance. Advised to notify provider if unable to tolerate prescribed regimen. Encouraged to monitor BP routinely. Discussed compliance with recommended cardiac prudent diet. Encouraged to read nutrition labels, monitor sodium intake and avoid highly processed foods when possible. Discussed activity tolerance. Reports she tends to tire easily but able to exercise on a stationary bike without difficulty. Advised to continue engaging in low impact activity as tolerated. Reviewed s/sx of heart attack, stroke and worsening symptoms that require immediate medical attention.  Patient Goals/Self-Care Activities: Self-administer medications as prescribed Adhere to recommended cardiac prudent/heart healthy diet Engage in low impact activities as tolerated Notify provider or care management team with questions and new concerns as needed   Follow Up Plan:  Will follow up next month    Patient Care Plan: Fall Risk (Adult)     Problem Identified: Fall Risk      Long-Range Goal: Absence of  Fall and Fall-Related Injury   Start Date: 08/06/2021  Expected End Date: 10/05/2021  Priority: Medium  Note:   Current Barriers:  Risk for Falls  Clinical Goal(s):  Over the next 60 days, patient will not experience falls or require hospitalization d/t fall related injuries.  Interventions:  Collaboration with Catherine Sizer, MD regarding development and update of comprehensive plan of care as evidenced by provider attestation and co-signature Inter-disciplinary care team collaboration (see longitudinal plan of care) Reviewed medications and discussed potential side effects such as dizziness and lightheadedness.  Provided information regarding safety and fall prevention. She required surgery for a broken hip in March. Reports tiring easily but not requiring daily use of a cane or walker. Advised to keep devices readily available to use if needed. Reports going to the gym regularly and using the stationary bike without difficulty. Discussed ability to perform ADL's and tasks in the home. Currently lives alone but has very good family support. Sister in law lives close. Reports being able to perform ADL's and small tasks in the home independently. She is very motivated to maintain her independence. Declines current need for additional in-home assistance.    Self-Care Deficits/Patient Goals:  Utilize assistive device appropriately with all ambulation Ensure pathways are clear and well lit Change positions slowly and use caution when ambulating Wear secure fitting, skid free footwear when ambulating Continue engaging in low impact activity as tolerated Notify provider or care management team for questions and new concerns as needed   Follow Up Plan:  Will follow up next month      [name] verbalized understanding of the information discussed during the telephonic outreach. Declined need for mailed/printed instruction.  Referral submitted for outreach with the CCM LCSW. A member  of the care management  team will follow up with [name] next month.   [name] [facility] Care Management [facility] [phone]

## 2021-08-06 NOTE — Chronic Care Management (AMB) (Signed)
Chronic Care Management   CCM RN Visit Note  08/06/2021 Name: [name] MRN: [id number] DOB: 06/12/1941  Subjective: [name] is a 80 y.o. year old female who is a primary care patient of Steele Sizer, MD. The care management team was consulted for assistance with disease management and care coordination needs.    Engaged with patient by telephone for follow up visit in response to provider referral for case management and care coordination services.   Consent to Services:  The patient was given the following information about Chronic Care Management services: 1. CCM service includes personalized support from designated clinical staff supervised by the primary care provider, including individualized plan of care and coordination with other care providers 2. 24/7 contact phone numbers for assistance for urgent and routine care needs. 3. Service will only be billed when office clinical staff spend 20 minutes or more in a month to coordinate care. 4. Only one practitioner may furnish and bill the service in a calendar month. 5.The patient may stop CCM services at any time (effective at the end of the month) by phone call to the office staff. 6. The patient will be responsible for cost sharing (co-pay) of up to 20% of the service fee (after annual deductible is met). Patient agreed to services and consent obtained.   Assessment: Review of patient past medical history, allergies, medications, health status, including review of consultants reports, laboratory and other test data, was performed as part of comprehensive evaluation and provision of chronic care management services.   SDOH (Social Determinants of Health) assessments and interventions performed:   SDOH Interventions    Flowsheet Row Most Recent Value  SDOH Interventions   Food Insecurity Interventions Intervention Not Indicated  Transportation Interventions Intervention Not Indicated        CCM Care  Plan  Allergies  Allergen Reactions   Biphosphate    Codeine     headaches   Combigan [Brimonidine Tartrate-Timolol]     drowsy   Sulfa Antibiotics Nausea And Vomiting and Nausea Only   Metronidazole Itching and Rash    Outpatient Encounter Medications as of 08/06/2021  Medication Sig Note   acetaminophen (TYLENOL) 500 MG tablet Take 500 mg by mouth every 6 (six) hours as needed.    ALPHAGAN P 0.1 % SOLN Place 1 drop into both eyes 3 (three) times daily.     bimatoprost (LUMIGAN) 0.01 % SOLN Place 1 drop into both eyes at bedtime.    Cyanocobalamin (B-12 COMPLIANCE INJECTION IJ) Inject as directed.    fluticasone (FLONASE) 50 MCG/ACT nasal spray SPRAY 2 SPRAYS INTO EACH NOSTRIL EVERY DAY    Polyethyl Glycol-Propyl Glycol 0.4-0.3 % SOLN Apply to eye. 08/06/2021: Using Systane Gel Drops   pravastatin (PRAVACHOL) 20 MG tablet Take 1 tablet (20 mg total) by mouth daily.    PREMARIN vaginal cream Place 1 application vaginally 2 (two) times a week.    White Petrolatum-Mineral Oil (SYSTANE NIGHTTIME) OINT Place 1 application into both eyes at bedtime as needed (dry eyes).    No facility-administered encounter medications on file as of 08/06/2021.    Patient Active Problem List   Diagnosis Date Noted   Hypotension    Impaired fasting glucose    Closed hip fracture requiring operative repair with routine healing, left 01/27/2021   Fall at home, initial encounter 01/27/2021   Overweight (BMI 25.0-29.9) 04/03/2020   S/P right TKA 04/02/2020   Status post total knee replacement, right 04/02/2020   Presence of  right artificial knee joint 04/02/2020   Pain in right knee 02/23/2020   Senile purpura ([place]) 12/30/2018   S/P vaginal hysterectomy 05/30/2018   Recurrent major depressive disorder, in full remission ([place]) 03/02/2018   GERD (gastroesophageal reflux disease) 10/20/2016   Anxiety, generalized 01/28/2016   Osteoarthritis of right knee 12/25/2015   Depression 09/05/2015   Abnormal  brain CT 06/20/2015   Anxiety, mild 06/20/2015   Brain lesion 06/20/2015   Clinical depression 06/20/2015   Dyslipidemia 06/20/2015   Vertigo 06/20/2015   Hyperlipidemia 06/20/2015   Benign hypertension 06/20/2015   Affective disorder, major 06/20/2015   Arthralgia of shoulder 06/20/2015   Glaucoma 06/20/2015   Post-menopausal 06/04/2015   Bilateral leg weakness 06/04/2015    Conditions to be addressed/monitored:HLD and Fall Risk Patient Care Plan: Dyslipidemia     Problem Identified: Dyslipidemia      Long-Range Goal: Dyslipidemia Monitored   Start Date: 08/06/2021  Expected End Date: 12/04/2021  Priority: Medium  Note:    Current Barriers:  Chronic Disease Management support and educational needs related to Dyslipidemia.  Case Manager Clinical Goal(s):  Over the next 120 days, patient will demonstrate improved adherence to prescribed treatment plan as evidenced by taking all medications as prescribed and adhering to cardiac prudent/heart healthy diet.  Interventions:  Collaboration with Steele Sizer, MD regarding development and update of comprehensive plan of care as evidenced by provider attestation and co-signature Inter-disciplinary care team collaboration (see longitudinal plan of care) Discussed medications and importance of compliance. Advised to notify provider if unable to tolerate prescribed regimen. Encouraged to monitor BP routinely. Discussed compliance with recommended cardiac prudent diet. Encouraged to read nutrition labels, monitor sodium intake and avoid highly processed foods when possible. Discussed activity tolerance. Reports she tends to tire easily but able to exercise on a stationary bike without difficulty. Advised to continue engaging in low impact activity as tolerated. Reviewed s/sx of heart attack, stroke and worsening symptoms that require immediate medical attention.  Patient Goals/Self-Care Activities: Self-administer medications as  prescribed Adhere to recommended cardiac prudent/heart healthy diet Engage in low impact activities as tolerated Notify provider or care management team with questions and new concerns as needed   Follow Up Plan:  Will follow up next month    Patient Care Plan: Fall Risk (Adult)     Problem Identified: Fall Risk      Long-Range Goal: Absence of Fall and Fall-Related Injury   Start Date: 08/06/2021  Expected End Date: 10/05/2021  Priority: Medium  Note:   Current Barriers:  Risk for Falls  Clinical Goal(s):  Over the next 60 days, patient will not experience falls or require hospitalization d/t fall related injuries.  Interventions:  Collaboration with Steele Sizer, MD regarding development and update of comprehensive plan of care as evidenced by provider attestation and co-signature Inter-disciplinary care team collaboration (see longitudinal plan of care) Reviewed medications and discussed potential side effects such as dizziness and lightheadedness.  Provided information regarding safety and fall prevention. She required surgery for a broken hip in March. Reports tiring easily but not requiring daily use of a cane or walker. Advised to keep devices readily available to use if needed. Reports going to the gym regularly and using the stationary bike without difficulty. Discussed ability to perform ADL's and tasks in the home. Currently lives alone but has very good family support. Sister in law lives close. Reports being able to perform ADL's and small tasks in the home independently. She is very motivated to maintain  her independence. Declines current need for additional in-home assistance.    Self-Care Deficits/Patient Goals:  Utilize assistive device appropriately with all ambulation Ensure pathways are clear and well lit Change positions slowly and use caution when ambulating Wear secure fitting, skid free footwear when ambulating Continue engaging in low impact activity  as tolerated Notify provider or care management team for questions and new concerns as needed   Follow Up Plan:  Will follow up next month      PLAN: Referral submitted for outreach with the CCM LCSW. A member of the care management team will follow up with Mrs. Spainhour next month.   [name] [phone]

## 2021-08-07 ENCOUNTER — Telehealth: Payer: Self-pay | Admitting: *Deleted

## 2021-08-07 NOTE — Telephone Encounter (Signed)
Pt called but I was unable to get her through to someone to help her.  The number left went to a voice mail.  I called the office and they said to leave a crm to the caller.

## 2021-08-07 NOTE — Chronic Care Management (AMB) (Signed)
  Chronic Care Management   Note  08/07/2021 Name: [name] MRN: [id number] DOB: Jun 24, 1941  [name] is a 80 y.o. year old female who is a primary care patient of Steele Sizer, MD. [name] is currently enrolled in care management services. An additional referral for Licensed Clinical SW was placed.   Follow up plan: Unsuccessful telephone outreach attempt made. A HIPAA compliant phone message was left for the patient providing contact information and requesting a return call.   Julian Hy, [place] Management  Direct Dial: [phone]

## 2021-08-11 NOTE — Chronic Care Management (AMB) (Signed)
  Chronic Care Management   Note  08/11/2021 Name: [name] MRN: [id number] DOB: 10-Aug-1941  [name] is a 80 y.o. year old female who is a primary care patient of Steele Sizer, MD. [name] is currently enrolled in care management services. An additional referral for Licensed Clinical SW was placed.   Follow up plan: 2nd Unsuccessful telephone outreach attempt made. A HIPAA compliant phone message was left for the patient providing contact information and requesting a return call.   Julian Hy, [place] Management  Direct Dial: [phone]

## 2021-08-12 ENCOUNTER — Ambulatory Visit (INDEPENDENT_AMBULATORY_CARE_PROVIDER_SITE_OTHER): Payer: Medicare HMO

## 2021-08-12 ENCOUNTER — Other Ambulatory Visit: Payer: Self-pay

## 2021-08-12 DIAGNOSIS — E538 Deficiency of other specified B group vitamins: Secondary | ICD-10-CM | POA: Diagnosis not present

## 2021-08-12 DIAGNOSIS — M810 Age-related osteoporosis without current pathological fracture: Secondary | ICD-10-CM | POA: Diagnosis not present

## 2021-08-12 MED ORDER — CYANOCOBALAMIN 1000 MCG/ML IJ SOLN
1000.0000 ug | Freq: Once | INTRAMUSCULAR | Status: AC
  Administered 2021-08-12: 1000 ug via INTRAMUSCULAR

## 2021-08-14 ENCOUNTER — Other Ambulatory Visit: Payer: Self-pay | Admitting: General Surgery

## 2021-08-14 DIAGNOSIS — Z800 Family history of malignant neoplasm of digestive organs: Secondary | ICD-10-CM | POA: Diagnosis not present

## 2021-08-14 DIAGNOSIS — Z1211 Encounter for screening for malignant neoplasm of colon: Secondary | ICD-10-CM | POA: Diagnosis not present

## 2021-08-14 NOTE — Progress Notes (Signed)
Subjective:     [name] is a 80 y.o. female.   HPI   The following portions of the patient's history were reviewed and updated as appropriate.   This a new patient is here today for: office visit. The patient has been referred by Dr. Ancil Boozer for evaluation of a colonoscopy.  Patient states Bowel movements are moving 4-5 days out of the week, which she states as normal for her. She states that there is no bleeding or mucus in her stools. Patient reports her last colonoscopy was done 5 years ago by Dr. Bary Castilla. The patient reports her mom has a history of colon cancer at age 69.   The patient suffered a hip fracture in March of this year just after daylight savings time began and she slipped on the steps in the dark.   The patient reports she is usually given antibiotics prior to dental procedures based on her prior knee replacement.   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      Chief Complaint  Patient presents with   Pre-op Exam      BP (!) 140/62   Pulse 71   Temp 36.3 C (97.4 F)   Ht 157.5 cm (5\' 2" )   Wt 67.1 kg (148 lb)   SpO2 95%   BMI 27.07 kg/m        Past Medical History:  Diagnosis Date   Anemia     Anxiety      no meds   Arthritis     Bilateral leg weakness     Complication of anesthesia      Difficult to arouse   Depression      no meds   GERD (gastroesophageal reflux disease)      diet controlled - only takes med prn basis   Glaucoma     HTN (hypertension)     Hypercholesteremia     Osteoporosis     Vertigo             Past Surgical History:  Procedure Laterality Date   [place]    for bleeding in 1970s   Bilateral Cataracts Removed Bilateral     COLONOSCOPY   2012   COLONOSCOPY WITH PROPOFOL   09/01/2016    Robert Bellow, MD   KNEE SURGERY       LEFT TOTAL HIP ARTHROPLASTY ANTERIOR APPROACH Left 01/28/2021    Dr. Rudene Christians   RIGHT  TOTAL KNEE ARTHROPLASTY Right 04/02/2020    Surgeon: Paralee Cancel, MD;   SUBCLAVIAN ANGIOGRAM       UPPER GI ENDOSCOPY   2012   Vaginal Hysterectomy.ANTERIOR AND POSTERIOR REPAIR WITH SACROSPINOUS FIXATION. Salpingophorectomy   05/30/2018    Surgeon: Louretta Shorten, MD   WISDOM TOOTH EXTRACTION                    OB History     Gravida  3   Para  2   Term      Preterm      AB      Living         SAB      IAB      Ectopic      Molar      Multiple      Live Births           Obstetric Comments  Age at  first period 53 Age of first pregnancy 55           Social History          Socioeconomic History   Marital status: Widowed  Tobacco Use   Smoking status: Never Smoker   Smokeless tobacco: Never Used  Substance and Sexual Activity   Drug use: Never             Allergies  Allergen Reactions   Codeine Nausea and Vomiting   Combigan [Brimonidine-Timolol] Other (See Comments)      drowsy   Sulfa (Sulfonamide Antibiotics) Nausea and Vomiting   Metronidazole Itching and Rash      Current Medications        Current Outpatient Medications  Medication Sig Dispense Refill   ALPHAGAN P 0.1 % ophthalmic solution INSTILL 1 DROP(S) IN BOTH EYES 3 TIMES DAILY FOR 30 DAYS       bimatoprost (LUMIGAN) 0.01 % ophthalmic solution Place 1 drop into both eyes nightly          fluticasone propionate (FLONASE) 50 mcg/actuation nasal spray Place 2 sprays into both nostrils once daily       peg 400-propylene glycol, PF, (SYSTANE ULTRA) 0.4-0.3 % ophthalmic drops Place 1 drop into both eyes as needed for Dry Eyes       PREMARIN 0.625 mg/gram vaginal cream INSERT 0.5GM VAGINALLY TWICE WEEKLY       acetaminophen (TYLENOL) 500 MG tablet Take by mouth       brimonidine-timoloL (COMBIGAN) 0.2-0.5 % ophthalmic solution INSTILL 1 DROP IN BOTH EYES TWICE A DAY (Patient not taking: Reported on 08/14/2021)       losartan (COZAAR) 25 MG tablet losartan 25 mg tablet  TAKE 1 TABLET  BY MOUTH EVERY DAY       peg 400-propylene glycol (SYSTANE ULTRA) 0.4-0.3 % drops Apply to eye       pravastatin (PRAVACHOL) 20 MG tablet Take 1 tablet by mouth once daily (Patient not taking: Reported on 08/14/2021)        No current facility-administered medications for this visit.             Family History  Problem Relation Age of Onset   High blood pressure (Hypertension) Mother     Colon cancer Mother     Alzheimer's disease Mother     High blood pressure (Hypertension) Father     Prostate cancer Father     Cerebral aneurysm Father     Diabetes Sister     Glaucoma Sister     Diabetes type II Sister     High blood pressure (Hypertension) Sister     Depression Sister     Prostate cancer Brother     Lymphoma Son     Breast cancer Neg Hx          Labs and Radiology:    September 01, 2016 colonoscopy:   Diverticulosis.   Laboratory review:   May 30, 2021:   Iron 45 - 160 mcg/dL 88   TIBC 250 - 450 mcg/dL (calc) 385   %SAT 16 - 45 % (calc) 23   Ferritin 16 - 288 ng/mL 38     WBC 3.8 - 10.8 Thousand/uL 5.9   RBC 3.80 - 5.10 Million/uL 4.77   Hemoglobin 11.7 - 15.5 g/dL 12.9   HCT 35.0 - 45.0 % 40.1   MCV 80.0 - 100.0 fL 84.1   MCH 27.0 - 33.0 pg 27.0   MCHC 32.0 - 36.0 g/dL  32.2   RDW 11.0 - 15.0 % 14.1   Platelets 140 - 400 Thousand/uL 251   MPV 7.5 - 12.5 fL 10.3   Neutro Abs 1,500 - 7,800 cells/uL 3,800   Lymphs Abs 850 - 3,900 cells/uL 1,741   Absolute Monocytes 200 - 950 cells/uL 271   Eosinophils Absolute 15 - 500 cells/uL 77   Basophils Absolute 0 - 200 cells/uL 12   Neutrophils Relative % % 64.4   Total Lymphocyte % 29.5   Monocytes Relative % 4.6   Eosinophils Relative % 1.3   Basophils Relative % 0.2     February 25, 2021:   Glucose, Bld 65 - 99 mg/dL 85   Comment: .             Fasting reference interval  .   BUN 7 - 25 mg/dL 11   Creat 0.60 - 0.88 mg/dL 0.67   Comment: For patients >29 years of age, the reference limit  for Creatinine is  approximately 13% higher for people  identified as African-American.  .   GFR, Est Non African American > OR = 60 mL/min/1.82m2 83   GFR, Est African American > OR = 60 mL/min/1.84m2 96   BUN/Creatinine Ratio 6 - 22 (calc) NOT APPLICABLE   Sodium 712 - 146 mmol/L 145   Potassium 3.5 - 5.3 mmol/L 4.4   Chloride 98 - 110 mmol/L 109   CO2 20 - 32 mmol/L 29   Calcium 8.6 - 10.4 mg/dL 9.1   Total Protein 6.1 - 8.1 g/dL 6.8   Albumin 3.6 - 5.1 g/dL 4.0   Globulin 1.9 - 3.7 g/dL (calc) 2.8   AG Ratio 1.0 - 2.5 (calc) 1.4   Total Bilirubin 0.2 - 1.2 mg/dL 0.4   Alkaline phosphatase (APISO) 37 - 153 U/L 103   AST 10 - 35 U/L 13   ALT 6 - 29 U/L 8           Review of Systems  Constitutional: Negative for chills and fever.  Respiratory: Negative for cough.          Objective:   Physical Exam Exam conducted with a chaperone present.  Constitutional:      Appearance: Normal appearance.  Cardiovascular:     Rate and Rhythm: Normal rate and regular rhythm.     Pulses: Normal pulses.     Heart sounds: Normal heart sounds.  Pulmonary:     Effort: Pulmonary effort is normal.     Breath sounds: Normal breath sounds.  Musculoskeletal:     Cervical back: Neck supple.  Skin:    General: Skin is warm and dry.  Neurological:     Mental Status: She is alert and oriented to person, place, and time.  Psychiatric:        Mood and Affect: Mood normal.        Behavior: Behavior normal.           Assessment:     Family history of colon cancer in a first-degree relative of at age 25, candidate for screening exam.    Plan:     The patient's general health is excellent, and for that reason colonoscopy is recommended for screening.   She was instructed in regards to preparation by the staff.    This note is partially prepared by Ledell Noss, CMA acting as a scribe in the presence of Dr. Hervey Ard, MD.    The documentation recorded by the scribe accurately reflects the service  I personally performed and the decisions made by me.    Robert Bellow, MD FACS

## 2021-08-15 DIAGNOSIS — E785 Hyperlipidemia, unspecified: Secondary | ICD-10-CM

## 2021-08-15 DIAGNOSIS — F339 Major depressive disorder, recurrent, unspecified: Secondary | ICD-10-CM | POA: Diagnosis not present

## 2021-08-15 NOTE — Chronic Care Management (AMB) (Signed)
  Chronic Care Management   Note  08/15/2021 Name: MYLANI GENTRY MRN: [id number] DOB: [birth year]  Shanicqua Coldren Mcinroy is a 80 y.o. year old female who is a primary care patient of Steele Sizer, MD. Dahlila Pfahler [place] is currently enrolled in care management services. An additional referral for Licensed Clinical SW was placed.   Follow up plan: Telephone appointment with care management team member scheduled for: 09/01/2021  Julian Hy, [place] Management  Direct Dial: [phone]

## 2021-08-25 ENCOUNTER — Ambulatory Visit (INDEPENDENT_AMBULATORY_CARE_PROVIDER_SITE_OTHER): Payer: Medicare HMO

## 2021-08-25 ENCOUNTER — Other Ambulatory Visit: Payer: Self-pay

## 2021-08-25 DIAGNOSIS — E538 Deficiency of other specified B group vitamins: Secondary | ICD-10-CM | POA: Diagnosis not present

## 2021-08-25 MED ORDER — CYANOCOBALAMIN 1000 MCG/ML IJ SOLN
1000.0000 ug | Freq: Once | INTRAMUSCULAR | Status: AC
  Administered 2021-08-25: 1000 ug via INTRAMUSCULAR

## 2021-09-01 ENCOUNTER — Ambulatory Visit (INDEPENDENT_AMBULATORY_CARE_PROVIDER_SITE_OTHER): Payer: Medicare HMO | Admitting: *Deleted

## 2021-09-01 DIAGNOSIS — F339 Major depressive disorder, recurrent, unspecified: Secondary | ICD-10-CM

## 2021-09-01 DIAGNOSIS — Z01818 Encounter for other preprocedural examination: Secondary | ICD-10-CM

## 2021-09-01 NOTE — Chronic Care Management (AMB) (Signed)
Chronic Care Management    Clinical Social Work Note  09/01/2021 Name: [name] MRN: [id number] DOB: 02-05-1941  [name] is a 80 y.o. year old female who is a primary care patient of Steele Sizer, MD. The CCM team was consulted to assist the patient with chronic disease management and/or care coordination needs related to: [place] and Resources.   Engaged with patient by telephone for initial visit in response to provider referral for social work chronic care management and care coordination services.   Consent to Services:  The patient was given the following information about Chronic Care Management services today, agreed to services, and gave verbal consent: 1. CCM service includes personalized support from designated clinical staff supervised by the primary care provider, including individualized plan of care and coordination with other care providers 2. 24/7 contact phone numbers for assistance for urgent and routine care needs. 3. Service will only be billed when office clinical staff spend 20 minutes or more in a month to coordinate care. 4. Only one practitioner may furnish and bill the service in a calendar month. 5.The patient may stop CCM services at any time (effective at the end of the month) by phone call to the office staff. 6. The patient will be responsible for cost sharing (co-pay) of up to 20% of the service fee (after annual deductible is met). Patient agreed to services and consent obtained.  Patient agreed to services and consent obtained.   Assessment: Review of patient past medical history, allergies, medications, and health status, including review of relevant consultants reports was performed today as part of a comprehensive evaluation and provision of chronic care management and care coordination services.     SDOH (Social Determinants of Health) assessments and interventions performed:    Advanced Directives Status: Not  addressed in this encounter.  CCM Care Plan  Allergies  Allergen Reactions   Biphosphate    Codeine     headaches   Combigan [Brimonidine Tartrate-Timolol]     drowsy   Sulfa Antibiotics Nausea And Vomiting and Nausea Only   Metronidazole Itching and Rash    Outpatient Encounter Medications as of 09/01/2021  Medication Sig Note   acetaminophen (TYLENOL) 500 MG tablet Take 500 mg by mouth every 6 (six) hours as needed.    ALPHAGAN P 0.1 % SOLN Place 1 drop into both eyes 3 (three) times daily.     bimatoprost (LUMIGAN) 0.01 % SOLN Place 1 drop into both eyes at bedtime.    Cyanocobalamin (B-12 COMPLIANCE INJECTION IJ) Inject as directed.    fluticasone (FLONASE) 50 MCG/ACT nasal spray SPRAY 2 SPRAYS INTO EACH NOSTRIL EVERY DAY    Polyethyl Glycol-Propyl Glycol 0.4-0.3 % SOLN Apply to eye. 08/06/2021: Using Systane Gel Drops   pravastatin (PRAVACHOL) 20 MG tablet Take 1 tablet (20 mg total) by mouth daily.    PREMARIN vaginal cream Place 1 application vaginally 2 (two) times a week.    White Petrolatum-Mineral Oil (SYSTANE NIGHTTIME) OINT Place 1 application into both eyes at bedtime as needed (dry eyes).    No facility-administered encounter medications on file as of 09/01/2021.    Patient Active Problem List   Diagnosis Date Noted   Hypotension    Impaired fasting glucose    Closed hip fracture requiring operative repair with routine healing, left 01/27/2021   Fall at home, initial encounter 01/27/2021   Overweight (BMI 25.0-29.9) 04/03/2020   S/P right TKA 04/02/2020   Status post total knee replacement,  right 04/02/2020   Presence of right artificial knee joint 04/02/2020   Pain in right knee 02/23/2020   Senile purpura ([place]) 12/30/2018   S/P vaginal hysterectomy 05/30/2018   Recurrent major depressive disorder, in full remission ([place]) 03/02/2018   GERD (gastroesophageal reflux disease) 10/20/2016   Anxiety, generalized 01/28/2016   Osteoarthritis of right knee  12/25/2015   Depression 09/05/2015   Abnormal brain CT 06/20/2015   Anxiety, mild 06/20/2015   Brain lesion 06/20/2015   Clinical depression 06/20/2015   Dyslipidemia 06/20/2015   Vertigo 06/20/2015   Hyperlipidemia 06/20/2015   Benign hypertension 06/20/2015   Affective disorder, major 06/20/2015   Arthralgia of shoulder 06/20/2015   Glaucoma 06/20/2015   Post-menopausal 06/04/2015   Bilateral leg weakness 06/04/2015    Conditions to be addressed/monitored: Depression; Mental Health Concerns   Care Plan : Depression (Adult)  Updates made by Vern Claude, LCSW since 09/01/2021 12:00 AM     Problem: Symptoms (Depression)      Goal: Symptoms Monitored and Managed   Start Date: 09/01/2021  Priority: Medium  Note:   Current Barriers:  Chronic Mental Health needs related to loss of her son and husband Mental Health Concerns -patient continues to grieve the loss of her husband and son-patient states that she feels that she is doing better-learning to manage the grief day by day-patient confirmed remaining active-works part time, exercises 2-3 days per week Suicidal Ideation/Homicidal Ideation: No  Clinical Social Work Goal(s):  Over the next 90 days, patient will work with SW bi-weekly by telephone or in person to reduce or manage symptoms related to depressed mood patient will work with SW to address concerns related to grief concerns  Interventions: Patient interviewed and appropriate assessments performed: PHQ 2 PHQ 9 Patient's grief response to family loss explored Confirmed history of ongoing counseling  and psychotropic medications-patient now declining both at this time-patient agreeable to short-term counseling Provided patient with information about grief counseling with Hospice and Palliative care if needed in the future Verbalization of feelings, use of positive self care strategies strategies Active listening / Reflection utilized  Emotional Support  Provided  Patient Self Care Activities:  Performs ADL's independently Performs IADL's independently Ability for insight Strong family or social support  Patient Coping Strengths:  Supportive Relationships Spirituality Hopefulness Self Advocate Able to Communicate Effectively  Patient Self Care Deficits:    Initial goal documentation     Task: Alleviate Barriers to Depression Treatment        Follow Up Plan: SW will follow up with patient by phone over the next 14 business days      [employer], [place] Worker  [facility] Care Management [phone]

## 2021-09-02 DIAGNOSIS — Z01419 Encounter for gynecological examination (general) (routine) without abnormal findings: Secondary | ICD-10-CM | POA: Diagnosis not present

## 2021-09-02 DIAGNOSIS — N952 Postmenopausal atrophic vaginitis: Secondary | ICD-10-CM | POA: Diagnosis not present

## 2021-09-02 DIAGNOSIS — N3281 Overactive bladder: Secondary | ICD-10-CM | POA: Diagnosis not present

## 2021-09-05 ENCOUNTER — Other Ambulatory Visit: Payer: Self-pay | Admitting: Family Medicine

## 2021-09-05 DIAGNOSIS — E785 Hyperlipidemia, unspecified: Secondary | ICD-10-CM

## 2021-09-09 ENCOUNTER — Encounter: Payer: Self-pay | Admitting: General Surgery

## 2021-09-10 ENCOUNTER — Ambulatory Visit
Admission: RE | Admit: 2021-09-10 | Discharge: 2021-09-10 | Disposition: A | Discharge status: Home / Self Care | Payer: Medicare HMO | Attending: General Surgery | Admitting: General Surgery

## 2021-09-10 ENCOUNTER — Ambulatory Visit: Payer: Medicare HMO | Admitting: Registered Nurse

## 2021-09-10 ENCOUNTER — Encounter
Admission: RE | Disposition: A | Discharge status: Home / Self Care | Payer: Self-pay | Source: Home / Self Care | Attending: General Surgery

## 2021-09-10 DIAGNOSIS — Z1211 Encounter for screening for malignant neoplasm of colon: Secondary | ICD-10-CM | POA: Diagnosis not present

## 2021-09-10 DIAGNOSIS — D121 Benign neoplasm of appendix: Secondary | ICD-10-CM | POA: Diagnosis not present

## 2021-09-10 DIAGNOSIS — Z96642 Presence of left artificial hip joint: Secondary | ICD-10-CM | POA: Insufficient documentation

## 2021-09-10 DIAGNOSIS — Z800 Family history of malignant neoplasm of digestive organs: Secondary | ICD-10-CM | POA: Insufficient documentation

## 2021-09-10 DIAGNOSIS — Z881 Allergy status to other antibiotic agents status: Secondary | ICD-10-CM | POA: Insufficient documentation

## 2021-09-10 DIAGNOSIS — E785 Hyperlipidemia, unspecified: Secondary | ICD-10-CM | POA: Diagnosis not present

## 2021-09-10 DIAGNOSIS — K5730 Diverticulosis of large intestine without perforation or abscess without bleeding: Secondary | ICD-10-CM | POA: Insufficient documentation

## 2021-09-10 DIAGNOSIS — K388 Other specified diseases of appendix: Secondary | ICD-10-CM | POA: Diagnosis not present

## 2021-09-10 DIAGNOSIS — Z888 Allergy status to other drugs, medicaments and biological substances status: Secondary | ICD-10-CM | POA: Diagnosis not present

## 2021-09-10 DIAGNOSIS — Z96651 Presence of right artificial knee joint: Secondary | ICD-10-CM | POA: Diagnosis not present

## 2021-09-10 DIAGNOSIS — Z885 Allergy status to narcotic agent status: Secondary | ICD-10-CM | POA: Diagnosis not present

## 2021-09-10 DIAGNOSIS — Z882 Allergy status to sulfonamides status: Secondary | ICD-10-CM | POA: Diagnosis not present

## 2021-09-10 DIAGNOSIS — K635 Polyp of colon: Secondary | ICD-10-CM | POA: Diagnosis not present

## 2021-09-10 DIAGNOSIS — K5790 Diverticulosis of intestine, part unspecified, without perforation or abscess without bleeding: Secondary | ICD-10-CM | POA: Diagnosis not present

## 2021-09-10 HISTORY — PX: COLONOSCOPY WITH PROPOFOL: SHX5780

## 2021-09-10 HISTORY — DX: Age-related osteoporosis without current pathological fracture: M81.0

## 2021-09-10 SURGERY — COLONOSCOPY WITH PROPOFOL
Anesthesia: General

## 2021-09-10 MED ORDER — PROPOFOL 10 MG/ML IV BOLUS
INTRAVENOUS | Status: DC | PRN
  Administered 2021-09-10 (×2): 10 mg via INTRAVENOUS
  Administered 2021-09-10: 70 mg via INTRAVENOUS

## 2021-09-10 MED ORDER — SODIUM CHLORIDE 0.9 % IV SOLN
INTRAVENOUS | Status: DC
  Administered 2021-09-10: 20 mL/h via INTRAVENOUS

## 2021-09-10 MED ORDER — PROPOFOL 500 MG/50ML IV EMUL
INTRAVENOUS | Status: AC
  Filled 2021-09-10: qty 50

## 2021-09-10 MED ORDER — LIDOCAINE HCL (PF) 2 % IJ SOLN
INTRAMUSCULAR | Status: AC
  Filled 2021-09-10: qty 5

## 2021-09-10 MED ORDER — LIDOCAINE HCL (CARDIAC) PF 100 MG/5ML IV SOSY
PREFILLED_SYRINGE | INTRAVENOUS | Status: DC | PRN
  Administered 2021-09-10: 40 mg via INTRAVENOUS

## 2021-09-10 MED ORDER — PROPOFOL 500 MG/50ML IV EMUL
INTRAVENOUS | Status: DC | PRN
  Administered 2021-09-10: 150 ug/kg/min via INTRAVENOUS

## 2021-09-10 MED ORDER — DEXMEDETOMIDINE (PRECEDEX) IN NS 20 MCG/5ML (4 MCG/ML) IV SYRINGE
PREFILLED_SYRINGE | INTRAVENOUS | Status: DC | PRN
  Administered 2021-09-10: 12 ug via INTRAVENOUS
  Administered 2021-09-10: 4 ug via INTRAVENOUS

## 2021-09-10 NOTE — Anesthesia Procedure Notes (Signed)
Date/Time: 09/10/2021 9:29 AM Performed by: Doreen Salvage, CRNA Pre-anesthesia Checklist: Patient identified, Emergency Drugs available, Suction available and Patient being monitored Patient Re-evaluated:Patient Re-evaluated prior to induction Oxygen Delivery Method: Nasal cannula Induction Type: IV induction Dental Injury: Teeth and Oropharynx as per pre-operative assessment  Comments: Nasal cannula with etCO2 monitoring

## 2021-09-10 NOTE — Anesthesia Preprocedure Evaluation (Addendum)
Anesthesia Evaluation  Patient identified by MRN, date of birth, ID band Patient awake    Reviewed: Allergy & Precautions, NPO status , Patient's Chart, lab work & pertinent test results  History of Anesthesia Complications (+) PONV and history of anesthetic complications  Airway Mallampati: II       Dental  (+) Implants   Pulmonary neg sleep apnea, neg COPD, Not current smoker,    Pulmonary exam normal        Cardiovascular hypertension, (-) Past MI and (-) CHF Normal cardiovascular exam(-) dysrhythmias (-) Valvular Problems/Murmurs  Pt reports having been off medication for HTN. States her recent clinic visit, her BP was normal.    Neuro/Psych neg Seizures PSYCHIATRIC DISORDERS Depression    GI/Hepatic Neg liver ROS, GERD  Controlled,  Endo/Other  neg diabetes  Renal/GU negative Renal ROS     Musculoskeletal  (+) Arthritis ,   Abdominal Normal abdominal exam  (+)   Peds  Hematology  (+) anemia ,   Anesthesia Other Findings   Reproductive/Obstetrics                            Anesthesia Physical  Anesthesia Plan  ASA: III  Anesthesia Plan: General   Post-op Pain Management:    Induction: Intravenous  PONV Risk Score and Plan: 3 and Treatment may vary due to age or medical condition, Propofol infusion and TIVA  Airway Management Planned: Nasal Cannula and Natural Airway  Additional Equipment:   Intra-op Plan:   Post-operative Plan:   Informed Consent: I have reviewed the patients History and Physical, chart, labs and discussed the procedure including the risks, benefits and alternatives for the proposed anesthesia with the patient or authorized representative who has indicated his/her understanding and acceptance.     Dental advisory given  Plan Discussed with: CRNA and Anesthesiologist  Anesthesia Plan Comments:        Anesthesia Quick Evaluation

## 2021-09-10 NOTE — H&P (Signed)
[name] [id number] Mar 26, 1941     HPI:  Healthy 80 y/o woman who's mother had colon cancer at age 43. For screening colonoscopy.  Diverticulosis noted in 2017.   Medications Prior to Admission  Medication Sig Dispense Refill Last Dose   acetaminophen (TYLENOL) 500 MG tablet Take 500 mg by mouth every 6 (six) hours as needed.   09/09/2021   ALPHAGAN P 0.1 % SOLN Place 1 drop into both eyes 3 (three) times daily.    09/09/2021   bimatoprost (LUMIGAN) 0.01 % SOLN Place 1 drop into both eyes at bedtime.   09/09/2021   brimonidine-timolol (COMBIGAN) 0.2-0.5 % ophthalmic solution Place 1 drop into both eyes every 12 (twelve) hours.   09/09/2021   Cyanocobalamin (B-12 COMPLIANCE INJECTION IJ) Inject as directed.   09/09/2021   fluticasone (FLONASE) 50 MCG/ACT nasal spray SPRAY 2 SPRAYS INTO EACH NOSTRIL EVERY DAY 48 mL 2 09/09/2021   losartan (COZAAR) 25 MG tablet Take 25 mg by mouth daily.   09/09/2021   Polyethyl Glycol-Propyl Glycol 0.4-0.3 % SOLN Apply to eye.   Past Week   pravastatin (PRAVACHOL) 20 MG tablet TAKE 1 TABLET BY MOUTH EVERY DAY 90 tablet 1 09/09/2021   PREMARIN vaginal cream Place 1 application vaginally 2 (two) times a week.  12 09/09/2021   White Petrolatum-Mineral Oil (SYSTANE NIGHTTIME) OINT Place 1 application into both eyes at bedtime as needed (dry eyes).   09/09/2021   Allergies  Allergen Reactions   Biphosphate    Codeine     headaches   Combigan [Brimonidine Tartrate-Timolol]     drowsy   Sulfa Antibiotics Nausea And Vomiting and Nausea Only   Metronidazole Itching and Rash   Past Medical History:  Diagnosis Date   Anemia    Anxiety    no meds   Arthritis    hands, knees - no meds   Bilateral leg weakness    Complication of anesthesia    Difficult to arouse   Dental bridge present    perm lower front bottom bridge   Depression    no meds   Eyes swollen    and red - was seen by MD for possible allergy to new eye drops   GERD  (gastroesophageal reflux disease)    diet controlled - only takes med prn basis   Glaucoma    Hypercholesteremia    Hypertension    Osteoporosis    SVD (spontaneous vaginal delivery)    x 3 - only one currently living   Vertigo    Past Surgical History:  Procedure Laterality Date   ABDOMINAL HYSTERECTOMY     ANAL FISSURE REPAIR  1970s   for bleeding in 1970s   ANTERIOR AND POSTERIOR REPAIR WITH SACROSPINOUS FIXATION N/A 05/30/2018   Procedure: ANTERIOR AND POSTERIOR REPAIR;  Surgeon: Louretta Shorten, MD;  Location: [facility];  Service: Gynecology;  Laterality: N/A;   bilateral cataracts removed Bilateral    COLONOSCOPY  2012   [facility]   COLONOSCOPY WITH PROPOFOL N/A 09/01/2016   Procedure: COLONOSCOPY WITH PROPOFOL;  Surgeon: Robert Bellow, MD;  Location: [facility];  Service: Endoscopy;  Laterality: N/A;   EYE SURGERY Bilateral    cataracts removed   KNEE SURGERY Left    SALPINGOOPHORECTOMY Right 05/30/2018   Procedure: SALPINGO OOPHORECTOMY;  Surgeon: Louretta Shorten, MD;  Location: [facility];  Service: Gynecology;  Laterality: Right;   SUBCLAVIAN ANGIOGRAM     TOTAL HIP ARTHROPLASTY Left 01/28/2021   Procedure: TOTAL HIP ARTHROPLASTY  ANTERIOR APPROACH;  Surgeon: Hessie Knows, MD;  Location: [facility];  Service: Orthopedics;  Laterality: Left;   TOTAL KNEE ARTHROPLASTY Right 04/02/2020   Procedure: TOTAL KNEE ARTHROPLASTY;  Surgeon: Paralee Cancel, MD;  Location: [facility];  Service: Orthopedics;  Laterality: Right;  70 mins   UPPER GI ENDOSCOPY  2012   VAGINAL HYSTERECTOMY N/A 05/30/2018   Procedure: HYSTERECTOMY VAGINAL;  Surgeon: Louretta Shorten, MD;  Location: [facility];  Service: Gynecology;  Laterality: N/A;   WISDOM TOOTH EXTRACTION     Social History   Socioeconomic History   Marital status: Widowed    Spouse name: [name]   Number of children: 3   Years of education: Not on file   Highest education level: 12th grade  Occupational History    Employer: [employer]    Comment: retired   Tobacco Use   Smoking status: Never   Smokeless tobacco: Never   Tobacco comments:    smoking cessation materials not required  Vaping Use   Vaping Use: Never used  Substance and Sexual Activity   Alcohol use: Yes    Alcohol/week: 0.0 standard drinks    Comment: occasional beer/mixed drink   Drug use: No   Sexual activity: Yes    Partners: Male    Birth control/protection: Post-menopausal  Other Topics Concern   Not on file  Social History Narrative   Pt lives alone. Significant other passed away in 16-Dec-2020   Social Determinants of Health   Financial Resource Strain: Low Risk    Difficulty of Paying Living Expenses: Not hard at all  Food Insecurity: No Food Insecurity   Worried About Charity fundraiser in the Last Year: Never true   Arboriculturist in the Last Year: Never true  Transportation Needs: No Transportation Needs   Lack of Transportation (Medical): No   Lack of Transportation (Non-Medical): No  Physical Activity: Sufficiently Active   Days of Exercise per Week: 3 days   Minutes of Exercise per Session: 50 min  Stress: No Stress Concern Present   Feeling of Stress : Not at all  Social Connections: Socially Isolated   Frequency of Communication with Friends and Family: More than three times a week   Frequency of Social Gatherings with Friends and Family: More than three times a week   Attends Religious Services: Never   Marine scientist or Organizations: No   Attends Archivist Meetings: Never   Marital Status: Widowed  Human resources officer Violence: Not At Risk   Fear of Current or Ex-Partner: No   Emotionally Abused: No   Physically Abused: No   Sexually Abused: No   Social History   Social History Narrative   Pt lives alone. Significant other passed away in 12-16-2020     ROS: Negative.     PE: HEENT: Negative. Lungs: Clear. Cardio: RR.   Assessment/Plan:   Proceed with planned endoscopy.   [name] 09/10/2021

## 2021-09-10 NOTE — Op Note (Signed)
[facility] Patient Name: [name] Procedure Date: 09/10/2021 9:14 AM MRN: [id number] Account #: [id number] Date of Birth: August 01, 1941 Admit Type: Outpatient Age: 80 Room: [facility] ENDO ROOM 1 Gender: Female Note Status: Finalized Instrument Name: Peds Colonoscope [id number] Procedure:             Colonoscopy Indications:           Screening patient at increased risk: Family history of                         1st-degree relative with colorectal cancer at age 63                         years (or older) Providers:             Robert Bellow, MD Medicines:             Propofol per Anesthesia Complications:         No immediate complications. Procedure:             Pre-Anesthesia Assessment:                        - Prior to the procedure, a History and Physical was                         performed, and patient medications, allergies and                         sensitivities were reviewed. The patient's tolerance                         of previous anesthesia was reviewed.                        - The risks and benefits of the procedure and the                         sedation options and risks were discussed with the                         patient. All questions were answered and informed                         consent was obtained.                        After obtaining informed consent, the colonoscope was                         passed under direct vision. Throughout the procedure,                         the patient's blood pressure, pulse, and oxygen                         saturations were monitored continuously. The                         Colonoscope was introduced through the anus and  advanced to the the cecum, identified by appendiceal                         orifice and ileocecal valve. The colonoscopy was                         performed without difficulty. The patient tolerated                          the procedure well. The quality of the bowel                         preparation was excellent. Findings:      Many medium-mouthed diverticula were found in the sigmoid colon and       descending colon.      A 5 mm polyp was found in the appendiceal orifice. The polyp was       sessile. Biopsies were taken with a cold forceps for histology.      The retroflexed view of the distal rectum and anal verge was normal and       showed no anal or rectal abnormalities. Impression:            - Diverticulosis in the sigmoid colon and in the                         descending colon.                        - One 5 mm polyp at the appendiceal orifice. Biopsied.                        - The distal rectum and anal verge are normal on                         retroflexion view. Recommendation:        - Telephone endoscopist for pathology results in 1                         week. Procedure Code(s):     --- Professional ---                        [phone], Colonoscopy, flexible; with biopsy, single or                         multiple Diagnosis Code(s):     --- Professional ---                        Z80.0, Family history of malignant neoplasm of                         digestive organs                        K63.5, Polyp of colon                        K57.30, Diverticulosis of large intestine without  perforation or abscess without bleeding CPT copyright 2019 American Medical Association. All rights reserved. The codes documented in this report are preliminary and upon coder review may  be revised to meet current compliance requirements. Robert Bellow, MD 09/10/2021 9:48:09 AM This report has been signed electronically. Number of Addenda: 0 Note Initiated On: 09/10/2021 9:14 AM Scope Withdrawal Time: 0 hours 11 minutes 35 seconds  Total Procedure Duration: 0 hours 17 minutes 31 seconds  Estimated Blood Loss:  Estimated blood loss: none.      [facility]

## 2021-09-10 NOTE — Transfer of Care (Signed)
Immediate Anesthesia Transfer of Care Note  Patient: [name]  Procedure(s) Performed: Procedure(s): COLONOSCOPY WITH PROPOFOL (N/A)  Patient Location: PACU and Endoscopy Unit  Anesthesia Type:General  Level of Consciousness: sedated  Airway & Oxygen Therapy: Patient Spontanous Breathing and Patient connected to nasal cannula oxygen  Post-op Assessment: Report given to RN and Post -op Vital signs reviewed and stable  Post vital signs: Reviewed and stable  Last Vitals:  Vitals:   09/10/21 0842 09/10/21 0951  BP: (!) 181/69 127/76  Pulse: 66 88  Resp: 20 (!) 23  Temp: (!) 35.7 C   SpO2: 196% 222%    Complications: No apparent anesthesia complications

## 2021-09-10 NOTE — Anesthesia Postprocedure Evaluation (Signed)
Anesthesia Post Note  Patient: [name]  Procedure(s) Performed: COLONOSCOPY WITH PROPOFOL  Patient location during evaluation: Endoscopy Anesthesia Type: General Level of consciousness: awake and alert Pain management: pain level controlled Vital Signs Assessment: post-procedure vital signs reviewed and stable Respiratory status: spontaneous breathing, nonlabored ventilation and respiratory function stable Cardiovascular status: blood pressure returned to baseline and stable Postop Assessment: no apparent nausea or vomiting Anesthetic complications: no   No notable events documented.   Last Vitals:  Vitals:   09/10/21 1010 09/10/21 1014  BP:  139/78  Pulse: 66 64  Resp: 19 16  Temp:    SpO2: 100% 99%    Last Pain:  Vitals:   09/10/21 0842  TempSrc: Temporal  PainSc: 0-No pain                 Iran Ouch

## 2021-09-11 ENCOUNTER — Encounter: Payer: Self-pay | Admitting: General Surgery

## 2021-09-11 LAB — SURGICAL PATHOLOGY

## 2021-09-12 ENCOUNTER — Other Ambulatory Visit: Payer: Self-pay

## 2021-09-12 ENCOUNTER — Ambulatory Visit (INDEPENDENT_AMBULATORY_CARE_PROVIDER_SITE_OTHER): Payer: Medicare HMO

## 2021-09-12 DIAGNOSIS — E538 Deficiency of other specified B group vitamins: Secondary | ICD-10-CM | POA: Diagnosis not present

## 2021-09-12 MED ORDER — CYANOCOBALAMIN 1000 MCG/ML IJ SOLN
1000.0000 ug | Freq: Once | INTRAMUSCULAR | Status: AC
  Administered 2021-09-12: 1000 ug via INTRAMUSCULAR

## 2021-09-15 ENCOUNTER — Ambulatory Visit: Payer: Medicare HMO | Admitting: *Deleted

## 2021-09-15 DIAGNOSIS — I10 Essential (primary) hypertension: Secondary | ICD-10-CM

## 2021-09-15 DIAGNOSIS — F339 Major depressive disorder, recurrent, unspecified: Secondary | ICD-10-CM | POA: Diagnosis not present

## 2021-09-15 NOTE — Chronic Care Management (AMB) (Signed)
Chronic Care Management    Clinical Social Work Note  09/15/2021 Name: [name] MRN: [id number] DOB: [birth year]  [name] is a 80 y.o. year old female who is a primary care patient of Steele Sizer, MD. The CCM team was consulted to assist the patient with chronic disease management and/or care coordination needs related to: [place] [facility].   Engaged with patient by telephone for follow up visit in response to provider referral for social work chronic care management and care coordination services.   Consent to Services:  The patient was given information about Chronic Care Management services, agreed to services, and gave verbal consent prior to initiation of services.  Please see initial visit note for detailed documentation.   Patient agreed to services and consent obtained.   Assessment: Review of patient past medical history, allergies, medications, and health status, including review of relevant consultants reports was performed today as part of a comprehensive evaluation and provision of chronic care management and care coordination services.     SDOH (Social Determinants of Health) assessments and interventions performed:    Advanced Directives Status: Not addressed in this encounter.  CCM Care Plan  Allergies  Allergen Reactions   Biphosphate    Codeine     headaches   Combigan [Brimonidine Tartrate-Timolol]     drowsy   Sulfa Antibiotics Nausea And Vomiting and Nausea Only   Metronidazole Itching and Rash    Outpatient Encounter Medications as of 09/15/2021  Medication Sig Note   acetaminophen (TYLENOL) 500 MG tablet Take 500 mg by mouth every 6 (six) hours as needed.    ALPHAGAN P 0.1 % SOLN Place 1 drop into both eyes 3 (three) times daily.     bimatoprost (LUMIGAN) 0.01 % SOLN Place 1 drop into both eyes at bedtime.    brimonidine-timolol (COMBIGAN) 0.2-0.5 % ophthalmic solution Place 1 drop into both eyes every 12  (twelve) hours.    Cyanocobalamin (B-12 COMPLIANCE INJECTION IJ) Inject as directed.    fluticasone (FLONASE) 50 MCG/ACT nasal spray SPRAY 2 SPRAYS INTO EACH NOSTRIL EVERY DAY    losartan (COZAAR) 25 MG tablet Take 25 mg by mouth daily.    Polyethyl Glycol-Propyl Glycol 0.4-0.3 % SOLN Apply to eye. 08/06/2021: Using Systane Gel Drops   pravastatin (PRAVACHOL) 20 MG tablet TAKE 1 TABLET BY MOUTH EVERY DAY    PREMARIN vaginal cream Place 1 application vaginally 2 (two) times a week.    White Petrolatum-Mineral Oil (SYSTANE NIGHTTIME) OINT Place 1 application into both eyes at bedtime as needed (dry eyes).    No facility-administered encounter medications on file as of 09/15/2021.    Patient Active Problem List   Diagnosis Date Noted   Hypotension    Impaired fasting glucose    Closed hip fracture requiring operative repair with routine healing, left 01/27/2021   Fall at home, initial encounter 01/27/2021   Overweight (BMI 25.0-29.9) 04/03/2020   S/P right TKA 04/02/2020   Status post total knee replacement, right 04/02/2020   Presence of right artificial knee joint 04/02/2020   Pain in right knee 02/23/2020   Senile purpura ([place]) 12/30/2018   S/P vaginal hysterectomy 05/30/2018   Recurrent major depressive disorder, in full remission ([place]) 03/02/2018   GERD (gastroesophageal reflux disease) 10/20/2016   Anxiety, generalized 01/28/2016   Osteoarthritis of right knee 12/25/2015   Depression 09/05/2015   Abnormal brain CT 06/20/2015   Anxiety, mild 06/20/2015   Brain lesion 06/20/2015   Clinical depression  06/20/2015   Dyslipidemia 06/20/2015   Vertigo 06/20/2015   Hyperlipidemia 06/20/2015   Benign hypertension 06/20/2015   Affective disorder, major 06/20/2015   Arthralgia of shoulder 06/20/2015   Glaucoma 06/20/2015   Post-menopausal 06/04/2015   Bilateral leg weakness 06/04/2015    Conditions to be addressed/monitored: Depression; Mental Health Concerns   Care Plan :  Depression (Adult)  Updates made by Vern Claude, LCSW since 09/15/2021 12:00 AM     Problem: Symptoms (Depression)      Goal: Symptoms Monitored and Managed   Start Date: 09/01/2021  Priority: Medium  Note:   Current Barriers:  Chronic Mental Health needs related to loss of her son and husband Mental Health Concerns -patient continues to grieve the loss of her husband and son-patient states that she feels that she is doing better-learning to manage the grief day by day-patient confirmed remaining active-works part time, exercises 2-3 days per week Suicidal Ideation/Homicidal Ideation: No  Clinical Social Work Goal(s):  Over the next 90 days, patient will work with SW bi-weekly by telephone or in person to reduce or manage symptoms related to depressed mood patient will work with SW to address concerns related to grief concerns  Interventions: Patient interviewed and appropriate assessments performed Patient's grief response to family loss explored Patient continues to report that she is coping well with the loss of her husband and son however reports that she has her sad days at times Patient continues to decline need for grief counseling at this time Provided patient with information about grief counseling with Hospice and Palliative care if needed in the future Patient's grief response normalized-positive reinforcement provided for positive coping strategies utilized Verbalization of feelings encouraged, use of positive self care strategies continues to be encouraged Active listening / Reflection utilized  Emotional Support Provided Patient encouraged to call this Education officer, museum with any additional community or mental health needs  Patient Self Care Activities:  Performs ADL's independently Performs IADL's independently Ability for insight Strong family or social support  Patient Coping Strengths:  Supportive Relationships Spirituality Hopefulness Self Advocate Able  to Communicate Effectively  Patient Self Care Deficits:    Please see past updates related to this goal by clicking on the "Past Updates" button in the selected goal        Follow Up Plan: Client will contact this social worker with any additional community resource needs.      Elliot Gurney, [place] [facility] Management [phone]

## 2021-09-15 NOTE — Patient Instructions (Signed)
Visit Information  PATIENT GOALS/PLAN OF CARE:  Care Plan : Depression (Adult)  Updates made by Vern Claude, LCSW since 09/15/2021 12:00 AM     Problem: Symptoms (Depression)      Goal: Symptoms Monitored and Managed   Start Date: 09/01/2021  Priority: Medium  Note:   Current Barriers:  Chronic Mental Health needs related to loss of her son and husband Mental Health Concerns -patient continues to grieve the loss of her husband and son-patient states that she feels that she is doing better-learning to manage the grief day by day-patient confirmed remaining active-works part time, exercises 2-3 days per week Suicidal Ideation/Homicidal Ideation: No  Clinical Social Work Goal(s):  Over the next 90 days, patient will work with SW bi-weekly by telephone or in person to reduce or manage symptoms related to depressed mood patient will work with SW to address concerns related to grief concerns  Interventions: Patient interviewed and appropriate assessments performed Patient's grief response to family loss explored Patient continues to report that she is coping well with the loss of her husband and son however reports that she has her sad days at times Patient continues to decline need for grief counseling at this time Provided patient with information about grief counseling with Hospice and Palliative care if needed in the future Patient's grief response normalized-positive reinforcement provided for positive coping strategies utilized Verbalization of feelings encouraged, use of positive self care strategies continues to be encouraged Active listening / Reflection utilized  Emotional Support Provided Patient encouraged to call this Education officer, museum with any additional community or mental health needs  Patient Self Care Activities:  Performs ADL's independently Performs IADL's independently Ability for insight Strong family or social support  Patient Coping Strengths:  Supportive  Relationships Spirituality Hopefulness Self Advocate Able to Communicate Effectively  Patient Self Care Deficits:    Please see past updates related to this goal by clicking on the "Past Updates" button in the selected goal       The patient verbalized understanding of instructions, educational materials, and care plan provided today and declined offer to receive copy of patient instructions, educational materials, and care plan.   No further follow up required: patient will contact this Education officer, museum with any additional community resource needs  [employer], [place] Worker  [facility] Care Management [phone]

## 2021-09-24 ENCOUNTER — Ambulatory Visit (INDEPENDENT_AMBULATORY_CARE_PROVIDER_SITE_OTHER): Payer: Medicare HMO

## 2021-09-24 ENCOUNTER — Other Ambulatory Visit: Payer: Self-pay

## 2021-09-24 DIAGNOSIS — E538 Deficiency of other specified B group vitamins: Secondary | ICD-10-CM

## 2021-09-24 MED ORDER — CYANOCOBALAMIN 1000 MCG/ML IJ SOLN
1000.0000 ug | Freq: Once | INTRAMUSCULAR | Status: AC
  Administered 2021-09-24: 1000 ug via INTRAMUSCULAR

## 2021-09-26 ENCOUNTER — Ambulatory Visit (INDEPENDENT_AMBULATORY_CARE_PROVIDER_SITE_OTHER): Payer: Medicare HMO

## 2021-09-26 DIAGNOSIS — Z9181 History of falling: Secondary | ICD-10-CM

## 2021-09-26 DIAGNOSIS — E785 Hyperlipidemia, unspecified: Secondary | ICD-10-CM

## 2021-09-26 NOTE — Chronic Care Management (AMB) (Signed)
Chronic Care Management   CCM RN Visit Note  09/26/2021 Name: MAYLIN FREEBURG MRN: [id number] DOB: Jul 25, 1941  Subjective: Allaina Brotzman Ballester is a 80 y.o. year old female who is a primary care patient of Steele Sizer, MD. The care management team was consulted for assistance with disease management and care coordination needs.    Engaged with patient by telephone for follow up visit in response to provider referral for case management and care coordination services.   Consent to Services:  The patient was given information about Chronic Care Management services, agreed to services, and gave verbal consent prior to initiation of services.  Please see initial visit note for detailed documentation.    Assessment: Review of patient past medical history, allergies, medications, health status, including review of consultants reports, laboratory and other test data, was performed as part of comprehensive evaluation and provision of chronic care management services.   SDOH (Social Determinants of Health) assessments and interventions performed:  No  CCM Care Plan  Allergies  Allergen Reactions   Biphosphate    Codeine     headaches   Combigan [Brimonidine Tartrate-Timolol]     drowsy   Sulfa Antibiotics Nausea And Vomiting and Nausea Only   Metronidazole Itching and Rash    Outpatient Encounter Medications as of 09/26/2021  Medication Sig Note   acetaminophen (TYLENOL) 500 MG tablet Take 500 mg by mouth every 6 (six) hours as needed.    ALPHAGAN P 0.1 % SOLN Place 1 drop into both eyes 3 (three) times daily.     bimatoprost (LUMIGAN) 0.01 % SOLN Place 1 drop into both eyes at bedtime.    brimonidine-timolol (COMBIGAN) 0.2-0.5 % ophthalmic solution Place 1 drop into both eyes every 12 (twelve) hours.    Cyanocobalamin (B-12 COMPLIANCE INJECTION IJ) Inject as directed.    fluticasone (FLONASE) 50 MCG/ACT nasal spray SPRAY 2 SPRAYS INTO EACH NOSTRIL EVERY DAY    losartan (COZAAR)  25 MG tablet Take 25 mg by mouth daily.    Polyethyl Glycol-Propyl Glycol 0.4-0.3 % SOLN Apply to eye. 08/06/2021: Using Systane Gel Drops   pravastatin (PRAVACHOL) 20 MG tablet TAKE 1 TABLET BY MOUTH EVERY DAY    PREMARIN vaginal cream Place 1 application vaginally 2 (two) times a week.    White Petrolatum-Mineral Oil (SYSTANE NIGHTTIME) OINT Place 1 application into both eyes at bedtime as needed (dry eyes).    No facility-administered encounter medications on file as of 09/26/2021.   Patient Care Plan: Dyslipidemia     Problem Identified: Dyslipidemia      Long-Range Goal: Dyslipidemia Monitored   Start Date: 08/06/2021  Expected End Date: 12/04/2021  Priority: Medium  Note:    Current Barriers:  Chronic Disease Management support and educational needs related to Dyslipidemia.  Case Manager Clinical Goal(s):  Over the next 120 days, patient will demonstrate improved adherence to prescribed treatment plan as evidenced by taking all medications as prescribed and adhering to cardiac prudent/heart healthy diet.  Interventions:  Collaboration with Steele Sizer, MD regarding development and update of comprehensive plan of care as evidenced by provider attestation and co-signature Inter-disciplinary care team collaboration (see longitudinal plan of care) Discussed medications and importance of compliance. Advised to notify provider if unable to tolerate prescribed regimen. Encouraged to monitor BP routinely. Discussed compliance with recommended cardiac prudent diet. Encouraged to read nutrition labels, monitor sodium intake and avoid highly processed foods when possible. Discussed activity tolerance. Reports she tends to tire easily but able to exercise on  a stationary bike without difficulty. Advised to continue engaging in low impact activity as tolerated. Reviewed s/sx of heart attack, stroke and worsening symptoms that require immediate medical attention.  Patient Goals/Self-Care  Activities: Self-administer medications as prescribed Adhere to recommended cardiac prudent/heart healthy diet Engage in low impact activities as tolerated Notify provider or care management team with questions and new concerns as needed   Follow Up Plan:  Will follow up next month    Patient Care Plan: Fall Risk (Adult)     Problem Identified: Fall Risk      Long-Range Goal: Absence of Fall and Fall-Related Injury   Start Date: 08/06/2021  Expected End Date: 10/05/2021  Priority: Medium  Note:   Current Barriers:  Risk for Falls  Clinical Goal(s):  Over the next 60 days, patient will not experience falls or require hospitalization d/t fall related injuries.  Interventions:  Collaboration with Steele Sizer, MD regarding development and update of comprehensive plan of care as evidenced by provider attestation and co-signature Inter-disciplinary care team collaboration (see longitudinal plan of care) Reviewed medications and discussed potential side effects such as dizziness and lightheadedness.  Provided information regarding safety and fall prevention. She required surgery for a broken hip in March. Reports tiring easily but not requiring daily use of a cane or walker. Advised to keep devices readily available to use if needed. Reports going to the gym regularly and using the stationary bike without difficulty. Discussed ability to perform ADL's and tasks in the home. Currently lives alone but has very good family support. Sister in law lives close. Reports being able to perform ADL's and small tasks in the home independently. She is very motivated to maintain her independence. Declines current need for additional in-home assistance.    Self-Care Deficits/Patient Goals:  Utilize assistive device appropriately with all ambulation Ensure pathways are clear and well lit Change positions slowly and use caution when ambulating Wear secure fitting, skid free footwear when  ambulating Continue engaging in low impact activity as tolerated Notify provider or care management team for questions and new concerns as needed   Follow Up Plan:  Will follow up next month       PLAN A member of the care management team will follow up next month.   [name] Care Management [facility] [phone]

## 2021-10-08 ENCOUNTER — Ambulatory Visit (INDEPENDENT_AMBULATORY_CARE_PROVIDER_SITE_OTHER): Payer: Medicare HMO

## 2021-10-08 ENCOUNTER — Other Ambulatory Visit: Payer: Self-pay

## 2021-10-08 ENCOUNTER — Ambulatory Visit: Payer: Medicare HMO

## 2021-10-08 DIAGNOSIS — E785 Hyperlipidemia, unspecified: Secondary | ICD-10-CM

## 2021-10-08 DIAGNOSIS — E538 Deficiency of other specified B group vitamins: Secondary | ICD-10-CM

## 2021-10-08 MED ORDER — CYANOCOBALAMIN 1000 MCG/ML IJ SOLN
1000.0000 ug | Freq: Once | INTRAMUSCULAR | Status: AC
  Administered 2021-10-08: 1000 ug via INTRAMUSCULAR

## 2021-10-08 NOTE — Chronic Care Management (AMB) (Signed)
Chronic Care Management   CCM RN Visit Note  10/08/2021 Name: [name] MRN: [id number] DOB: 1941-06-05  Subjective: [name] is a 80 y.o. year old female who is a primary care patient of Steele Sizer, MD. The care management team was consulted for assistance with disease management and care coordination needs.    Engaged with patient by telephone for follow up visit in response to provider referral for case management and care coordination services.   Consent to Services:  The patient was given information about Chronic Care Management services, agreed to services, and gave verbal consent prior to initiation of services.  Please see initial visit note for detailed documentation.    Assessment: Review of patient past medical history, allergies, medications, health status, including review of consultants reports, laboratory and other test data, was performed as part of comprehensive evaluation and provision of chronic care management services.   SDOH (Social Determinants of Health) assessments and interventions performed:  No  CCM Care Plan  Allergies  Allergen Reactions   Biphosphate    Codeine     headaches   Combigan [Brimonidine Tartrate-Timolol]     drowsy   Sulfa Antibiotics Nausea And Vomiting and Nausea Only   Metronidazole Itching and Rash    Outpatient Encounter Medications as of 10/08/2021  Medication Sig Note   acetaminophen (TYLENOL) 500 MG tablet Take 500 mg by mouth every 6 (six) hours as needed.    ALPHAGAN P 0.1 % SOLN Place 1 drop into both eyes 3 (three) times daily.     bimatoprost (LUMIGAN) 0.01 % SOLN Place 1 drop into both eyes at bedtime.    brimonidine-timolol (COMBIGAN) 0.2-0.5 % ophthalmic solution Place 1 drop into both eyes every 12 (twelve) hours.    Cyanocobalamin (B-12 COMPLIANCE INJECTION IJ) Inject as directed.    fluticasone (FLONASE) 50 MCG/ACT nasal spray SPRAY 2 SPRAYS INTO EACH NOSTRIL EVERY DAY    losartan (COZAAR)  25 MG tablet Take 25 mg by mouth daily.    Polyethyl Glycol-Propyl Glycol 0.4-0.3 % SOLN Apply to eye. 08/06/2021: Using Systane Gel Drops   pravastatin (PRAVACHOL) 20 MG tablet TAKE 1 TABLET BY MOUTH EVERY DAY    PREMARIN vaginal cream Place 1 application vaginally 2 (two) times a week.    White Petrolatum-Mineral Oil (SYSTANE NIGHTTIME) OINT Place 1 application into both eyes at bedtime as needed (dry eyes).    No facility-administered encounter medications on file as of 10/08/2021.    Patient Active Problem List   Diagnosis Date Noted   Hypotension    Impaired fasting glucose    Closed hip fracture requiring operative repair with routine healing, left 01/27/2021   Fall at home, initial encounter 01/27/2021   Overweight (BMI 25.0-29.9) 04/03/2020   S/P right TKA 04/02/2020   Status post total knee replacement, right 04/02/2020   Presence of right artificial knee joint 04/02/2020   Pain in right knee 02/23/2020   Senile purpura ([place]) 12/30/2018   S/P vaginal hysterectomy 05/30/2018   Recurrent major depressive disorder, in full remission ([place]) 03/02/2018   GERD (gastroesophageal reflux disease) 10/20/2016   Anxiety, generalized 01/28/2016   Osteoarthritis of right knee 12/25/2015   Depression 09/05/2015   Abnormal brain CT 06/20/2015   Anxiety, mild 06/20/2015   Brain lesion 06/20/2015   Clinical depression 06/20/2015   Dyslipidemia 06/20/2015   Vertigo 06/20/2015   Hyperlipidemia 06/20/2015   Benign hypertension 06/20/2015   Affective disorder, major 06/20/2015   Arthralgia of shoulder 06/20/2015  Glaucoma 06/20/2015   Post-menopausal 06/04/2015   Bilateral leg weakness 06/04/2015   Patient Care Plan: Dyslipidemia     Problem Identified: Dyslipidemia      Long-Range Goal: Dyslipidemia Monitored   Start Date: 08/06/2021  Expected End Date: 12/04/2021  Priority: Medium  Note:    Current Barriers:  Chronic Disease Management support and educational needs related to  Dyslipidemia.  Case Manager Clinical Goal(s):  Over the next 120 days, patient will demonstrate improved adherence to prescribed treatment plan as evidenced by taking all medications as prescribed and adhering to cardiac prudent/heart healthy diet.  Interventions:  Collaboration with Steele Sizer, MD regarding development and update of comprehensive plan of care as evidenced by provider attestation and co-signature Inter-disciplinary care team collaboration (see longitudinal plan of care) Discussed medications and importance of compliance. Advised to notify provider if unable to tolerate prescribed regimen. Encouraged to monitor BP routinely. Discussed compliance with recommended cardiac prudent diet. Encouraged to read nutrition labels, monitor sodium intake and avoid highly processed foods when possible. Discussed activity tolerance. Reports she tends to tire easily but able to exercise on a stationary bike without difficulty. Advised to continue engaging in low impact activity as tolerated. Reviewed s/sx of heart attack, stroke and worsening symptoms that require immediate medical attention.  Patient Goals/Self-Care Activities: Self-administer medications as prescribed Adhere to recommended cardiac prudent/heart healthy diet Engage in low impact activities as tolerated Notify provider or care management team with questions and new concerns as needed        PLAN A member of the care management team will follow up with [name] within the next two weeks.   [name] Care Management [facility] [phone]

## 2021-10-15 DIAGNOSIS — E785 Hyperlipidemia, unspecified: Secondary | ICD-10-CM

## 2021-10-23 DIAGNOSIS — M810 Age-related osteoporosis without current pathological fracture: Secondary | ICD-10-CM | POA: Diagnosis not present

## 2021-11-05 ENCOUNTER — Ambulatory Visit (INDEPENDENT_AMBULATORY_CARE_PROVIDER_SITE_OTHER): Payer: Medicare HMO

## 2021-11-05 DIAGNOSIS — E538 Deficiency of other specified B group vitamins: Secondary | ICD-10-CM | POA: Diagnosis not present

## 2021-11-05 MED ORDER — CYANOCOBALAMIN 1000 MCG/ML IJ SOLN
1000.0000 ug | Freq: Once | INTRAMUSCULAR | Status: AC
  Administered 2021-11-05: 1000 ug via INTRAMUSCULAR

## 2021-11-14 DIAGNOSIS — H524 Presbyopia: Secondary | ICD-10-CM | POA: Diagnosis not present

## 2021-11-17 DIAGNOSIS — R0981 Nasal congestion: Secondary | ICD-10-CM | POA: Diagnosis not present

## 2021-11-19 DIAGNOSIS — H16223 Keratoconjunctivitis sicca, not specified as Sjogren's, bilateral: Secondary | ICD-10-CM | POA: Diagnosis not present

## 2021-11-21 ENCOUNTER — Telehealth: Payer: Self-pay

## 2021-11-21 NOTE — Telephone Encounter (Signed)
°  Care Management   Follow Up Note   11/21/2021 Name: [name] MRN: [id number] DOB: 1941-03-30   Primary Care Provider: Steele Sizer, MD Reason for referral : Chronic Care Management   An unsuccessful telephone outreach was attempted today. The patient was referred to the case management team for assistance with care management and care coordination.    Follow Up Plan:  A HIPAA compliant voice message was left today requesting a return call.   [name] Care Management [facility] [phone]

## 2021-11-26 DIAGNOSIS — H16143 Punctate keratitis, bilateral: Secondary | ICD-10-CM | POA: Diagnosis not present

## 2021-12-01 DIAGNOSIS — H524 Presbyopia: Secondary | ICD-10-CM | POA: Diagnosis not present

## 2021-12-01 DIAGNOSIS — H401130 Primary open-angle glaucoma, bilateral, stage unspecified: Secondary | ICD-10-CM | POA: Diagnosis not present

## 2021-12-01 DIAGNOSIS — Z0100 Encounter for examination of eyes and vision without abnormal findings: Secondary | ICD-10-CM | POA: Diagnosis not present

## 2021-12-01 NOTE — Progress Notes (Signed)
Name: [name]   MRN: [id number]    DOB: 1941/05/17   Date:12/02/2021       Progress Note  Subjective  Chief Complaint  Follow Up  HPI  History of fracture of left hip : she fell at home, missed one of her garage steps on 01/27/2021, she called 911 and was transported to [facility], she had Total left hip replacement surgery on 01/28/2021 by Dr. Rudene Christians. 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 She states she is doing well.   Low B12: she was getting B12 injections every other week, but changed insurance and is back taking once a month, feeling tired again , a little light headed over the past couple of weeks. No tingling or numbness. I also advised to drink more fluids and get up slowly since her bp is in the 120 range.   HTN: she has been off Losartan since hip replacement surgery back in 2021-02-12 bp is in the 120's with mild dizziness, advised to drink more water and get up slowly    Hyperlipidemia: taking pravastatin daily now , last LDL was at goal at 64. Denies myalgia   Depression Major Chronic: she states doing well, still misses her son , that died at age 18 from [place] back in Feb 12, 2013. Her husband died 6 years before , on the same day on October 14 th. She has two grandchildren from him and two other grandchildren from her youngest son. She states she really struggles from October until the end of December. She states Christmas is always hard but starting to feel better now. She tried Buspar but she did not like it and stopped, she does not want any medications at this time. Discussed grieve counseling through hospice . Still not ready for counseling    GERD: she has stopped omeprazole but is now on Pepcid prn and is doing well . Denies heartburn or indigestion    Hyperglycemia: A1C was 5.8% but last visit was 5.4%  Denies polyphagia, polydipsia or polyuria. She has episodes of hypoglycemia and family history of diabetes, she gets light headed when she skips meals. Unchanged     Senile purpura: on both arms, stable  Osteoporosis: she states she was given Boniva during hospital stay but she states it caused indigestion and also bone pain, it has happened in the past also, she has a referral to see Endocrionologist and appointment was scheduled for August   Patient Active Problem List   Diagnosis Date Noted   Hypotension    Impaired fasting glucose    Closed hip fracture requiring operative repair with routine healing, left 01/27/2021   Fall at home, initial encounter 01/27/2021   Overweight (BMI 25.0-29.9) 04/03/2020   S/P right TKA 04/02/2020   Status post total knee replacement, right 04/02/2020   Presence of right artificial knee joint 04/02/2020   Pain in right knee 02/23/2020   Senile purpura ([place]) 12/30/2018   S/P vaginal hysterectomy 05/30/2018   Recurrent major depressive disorder, in full remission ([place]) 03/02/2018   GERD (gastroesophageal reflux disease) 10/20/2016   Anxiety, generalized 01/28/2016   Osteoarthritis of right knee 12/25/2015   Depression 09/05/2015   Abnormal brain CT 06/20/2015   Anxiety, mild 06/20/2015   Brain lesion 06/20/2015   Clinical depression 06/20/2015   Dyslipidemia 06/20/2015   Vertigo 06/20/2015   Hyperlipidemia 06/20/2015   Benign hypertension 06/20/2015   Affective disorder, major 06/20/2015   Arthralgia of shoulder 06/20/2015   Glaucoma 06/20/2015  Post-menopausal 06/04/2015   Bilateral leg weakness 06/04/2015    Past Surgical History:  Procedure Laterality Date   ABDOMINAL HYSTERECTOMY     ANAL FISSURE REPAIR  1970s   for bleeding in 1970s   ANTERIOR AND POSTERIOR REPAIR WITH SACROSPINOUS FIXATION N/A 05/30/2018   Procedure: ANTERIOR AND POSTERIOR REPAIR;  Surgeon: Louretta Shorten, MD;  Location: [facility];  Service: Gynecology;  Laterality: N/A;   bilateral cataracts removed Bilateral    COLONOSCOPY  2012   [facility]   COLONOSCOPY WITH PROPOFOL N/A 09/01/2016   Procedure: COLONOSCOPY WITH PROPOFOL;   Surgeon: Robert Bellow, MD;  Location: [facility];  Service: Endoscopy;  Laterality: N/A;   COLONOSCOPY WITH PROPOFOL N/A 09/10/2021   Procedure: COLONOSCOPY WITH PROPOFOL;  Surgeon: Robert Bellow, MD;  Location: [facility];  Service: Endoscopy;  Laterality: N/A;   EYE SURGERY Bilateral    cataracts removed   KNEE SURGERY Left    SALPINGOOPHORECTOMY Right 05/30/2018   Procedure: SALPINGO OOPHORECTOMY;  Surgeon: Louretta Shorten, MD;  Location: [facility];  Service: Gynecology;  Laterality: Right;   SUBCLAVIAN ANGIOGRAM     TOTAL HIP ARTHROPLASTY Left 01/28/2021   Procedure: TOTAL HIP ARTHROPLASTY ANTERIOR APPROACH;  Surgeon: Hessie Knows, MD;  Location: [facility];  Service: Orthopedics;  Laterality: Left;   TOTAL KNEE ARTHROPLASTY Right 04/02/2020   Procedure: TOTAL KNEE ARTHROPLASTY;  Surgeon: Paralee Cancel, MD;  Location: [facility];  Service: Orthopedics;  Laterality: Right;  70 mins   UPPER GI ENDOSCOPY  2012   VAGINAL HYSTERECTOMY N/A 05/30/2018   Procedure: HYSTERECTOMY VAGINAL;  Surgeon: Louretta Shorten, MD;  Location: [facility];  Service: Gynecology;  Laterality: N/A;   WISDOM TOOTH EXTRACTION      Family History  Problem Relation Age of Onset   Cancer Mother 80       colon   Alzheimer's disease Mother    Hypertension Mother    Prostate cancer Father    Hypertension Father    Cerebral aneurysm Father    Diabetes Sister    Depression Sister    Hypertension Sister    Depression Sister    Depression Brother    Cancer Brother        prostate   Cancer Brother    Gallbladder disease Brother    Prostate cancer Brother    Lymphoma Son    Cancer Son 57       lymphoma   Breast cancer Neg Hx     Social History   Tobacco Use   Smoking status: Never   Smokeless tobacco: Never   Tobacco comments:    smoking cessation materials not required  Substance Use Topics   Alcohol use: Yes    Alcohol/week: 0.0 standard drinks    Comment: occasional beer/mixed drink     Current  Outpatient Medications:    acetaminophen (TYLENOL) 500 MG tablet, Take 500 mg by mouth every 6 (six) hours as needed., Disp: , Rfl:    ALPHAGAN P 0.1 % SOLN, Place 1 drop into both eyes 3 (three) times daily. , Disp: , Rfl:    bimatoprost (LUMIGAN) 0.01 % SOLN, Place 1 drop into both eyes at bedtime., Disp: , Rfl:    fluticasone (FLONASE) 50 MCG/ACT nasal spray, SPRAY 2 SPRAYS INTO EACH NOSTRIL EVERY DAY, Disp: 48 mL, Rfl: 2   Lifitegrast (XIIDRA) 5 % SOLN, Apply to eye., Disp: , Rfl:    losartan (COZAAR) 25 MG tablet, Take 25 mg by mouth daily., Disp: , Rfl:  Polyethyl Glycol-Propyl Glycol 0.4-0.3 % SOLN, Apply to eye. Using Refresh Mega-3, Disp: , Rfl:    pravastatin (PRAVACHOL) 20 MG tablet, TAKE 1 TABLET BY MOUTH EVERY DAY, Disp: 90 tablet, Rfl: 1   PREMARIN vaginal cream, Place 1 application vaginally 2 (two) times a week., Disp: , Rfl: 12   Cyanocobalamin (B-12 COMPLIANCE INJECTION IJ), Inject as directed. (Patient not taking: Reported on 12/02/2021), Disp: , Rfl:    White Petrolatum-Mineral Oil (SYSTANE NIGHTTIME) OINT, Place 1 application into both eyes at bedtime as needed (dry eyes). (Patient not taking: Reported on 12/02/2021), Disp: , Rfl:   Allergies  Allergen Reactions   Biphosphate    Codeine     headaches   Combigan [Brimonidine Tartrate-Timolol]     drowsy   Sulfa Antibiotics Nausea And Vomiting and Nausea Only   Metronidazole Itching and Rash    I personally reviewed active problem list, medication list, allergies, family history, social history, health maintenance with the patient/caregiver today.   ROS  Constitutional: Negative for fever or weight change.  Respiratory: Negative for cough and shortness of breath.   Cardiovascular: Negative for chest pain or palpitations.  Gastrointestinal: Negative for abdominal pain, no bowel changes.  Musculoskeletal: Negative for gait problem or joint swelling.  Skin: Negative for rash.  Neurological: positive  for  intermittent dizziness but no headache.  No other specific complaints in a complete review of systems (except as listed in HPI above).   Objective  Vitals:   12/02/21 1443  BP: 126/76  Pulse: 84  Resp: 16  Temp: 98.2 F (36.8 C)  SpO2: 98%  Weight: 146 lb (66.2 kg)  Height: 5\' 2"  (1.575 m)    Body mass index is 26.7 kg/m.  Physical Exam  Constitutional: Patient appears well-developed and well-nourished. No distress.  HEENT: head atraumatic, normocephalic, pupils equal and reactive to light, neck supple, Cardiovascular: Normal rate, regular rhythm and normal heart sounds.  No murmur heard. No BLE edema. Pulmonary/Chest: Effort normal and breath sounds normal. No respiratory distress. Abdominal: Soft.  There is no tenderness. Psychiatric: Patient has a normal mood and affect. behavior is normal. Judgment and thought content normal.   Recent Results (from the past 2160 hour(s))  Surgical pathology     Status: None   Collection Time: 09/10/21  9:35 AM  Result Value Ref Range   SURGICAL PATHOLOGY      SURGICAL PATHOLOGY CASE: [id number] PATIENT: [facility] Surgical Pathology Report     Specimen Submitted: A. Appendiceal orifice; cbx  Clinical History: Family history of colon cancer.  Polyp and diverticulosis      DIAGNOSIS: A. APPENDICEAL ORIFICE; COLD BIOPSY: - SESSILE SERRATED POLYP. - NEGATIVE FOR DYSPLASIA AND MALIGNANCY.   GROSS DESCRIPTION: A. Labeled: cbx appendiceal orifice Received: Formalin Collection time: 9:35 AM on 09/10/2021 Placed into formalin time: 9:35 AM on 09/10/2021 Tissue fragment(s): 2 Size: Ranges from 0.2-0.3 cm Description: Tan soft tissue fragments Entirely submitted in 1 cassette.  [facility] 09/10/2021  Final Diagnosis performed by Betsy Pries, MD.   Electronically signed 09/11/2021 10:15:24AM The electronic signature indicates that the named Attending Pathologist has evaluated the specimen Technical component  performed at [facility], [address], [place], [place] [address] Lab: [phone] Dir: Louretta Shorten, MMM  Professional component performed at [facility], [facility], [place], [place], [place] [address] Lab: [phone] Dir: Kathi Simpers, MD     PHQ2/9: Depression screen [facility] 2/9 12/02/2021 09/01/2021 08/06/2021 08/06/2021 07/23/2021  Decreased Interest 0 0 0  0 0  Down, Depressed, Hopeless 1 1 1  0 0  PHQ - 2 Score 1 1 1  0 0  Altered sleeping 0 - - - -  Tired, decreased energy 2 - - - -  Change in appetite 0 - - - -  Feeling bad or failure about yourself  0 - - - -  Trouble concentrating 1 - - - -  Moving slowly or fidgety/restless 0 - - - -  Suicidal thoughts 0 - - - -  PHQ-9 Score 4 - - - -  Difficult doing work/chores - - - - -  Some recent data might be hidden    phq 9 is positive   Fall Risk: Fall Risk  12/02/2021 08/06/2021 07/23/2021 05/30/2021 04/22/2021  Falls in the past year? 0 1 1 1 1   Comment - - - - -  Number falls in past yr: 0 0 0 0 0  Injury with Fall? 0 1 0 1 1  Risk for fall due to : No Fall Risks History of fall(s);Other (Comment) - - History of fall(s)  Risk for fall due to: Comment - Hx of Left Hip Replacement - - -  Follow up Falls prevention discussed Falls prevention discussed - - Falls prevention discussed     Functional Status Survey: Is the patient deaf or have difficulty hearing?: Yes Does the patient have difficulty seeing, even when wearing glasses/contacts?: Yes Does the patient have difficulty concentrating, remembering, or making decisions?: Yes Does the patient have difficulty walking or climbing stairs?: No Does the patient have difficulty dressing or bathing?: No Does the patient have difficulty doing errands alone such as visiting a doctor's office or shopping?: No    Assessment & Plan  1. Major depression, recurrent, chronic ([place])   2. History of hypertension    3. Senile purpura ([place])   4.  Gastroesophageal reflux disease without esophagitis  - famotidine (PEPCID) 20 MG tablet; Take 1 tablet (20 mg total) by mouth 2 (two) times daily.  Dispense: 60 tablet; Refill: 0  5. B12 deficiency  - cyanocobalamin ((VITAMIN B-12)) injection 1,000 mcg  6. Dyslipidemia   7. Age-related osteoporosis without current pathological fracture  She is getting Reclast yearly done by Dr. Honor Junes, dose was given 12/08  8. Hyperglycemia   9. History of total left hip replacement

## 2021-12-02 ENCOUNTER — Ambulatory Visit (INDEPENDENT_AMBULATORY_CARE_PROVIDER_SITE_OTHER): Payer: Medicare HMO | Admitting: Family Medicine

## 2021-12-02 ENCOUNTER — Encounter: Payer: Self-pay | Admitting: Family Medicine

## 2021-12-02 VITALS — BP 126/76 | HR 84 | Temp 98.2°F | Resp 16 | Ht 62.0 in | Wt 146.0 lb

## 2021-12-02 DIAGNOSIS — E785 Hyperlipidemia, unspecified: Secondary | ICD-10-CM

## 2021-12-02 DIAGNOSIS — R69 Illness, unspecified: Secondary | ICD-10-CM | POA: Diagnosis not present

## 2021-12-02 DIAGNOSIS — M810 Age-related osteoporosis without current pathological fracture: Secondary | ICD-10-CM | POA: Diagnosis not present

## 2021-12-02 DIAGNOSIS — Z8679 Personal history of other diseases of the circulatory system: Secondary | ICD-10-CM

## 2021-12-02 DIAGNOSIS — K219 Gastro-esophageal reflux disease without esophagitis: Secondary | ICD-10-CM

## 2021-12-02 DIAGNOSIS — D692 Other nonthrombocytopenic purpura: Secondary | ICD-10-CM | POA: Diagnosis not present

## 2021-12-02 DIAGNOSIS — E538 Deficiency of other specified B group vitamins: Secondary | ICD-10-CM

## 2021-12-02 DIAGNOSIS — Z96642 Presence of left artificial hip joint: Secondary | ICD-10-CM

## 2021-12-02 DIAGNOSIS — F339 Major depressive disorder, recurrent, unspecified: Secondary | ICD-10-CM | POA: Diagnosis not present

## 2021-12-02 DIAGNOSIS — R739 Hyperglycemia, unspecified: Secondary | ICD-10-CM

## 2021-12-02 MED ORDER — FAMOTIDINE 20 MG PO TABS: 20.0000 mg | ORAL_TABLET | Freq: Two times a day (BID) | ORAL | 0 refills | Status: DC

## 2021-12-02 MED ORDER — CYANOCOBALAMIN 1000 MCG/ML IJ SOLN
1000.0000 ug | Freq: Once | INTRAMUSCULAR | Status: AC
  Administered 2021-12-02: 1000 ug via INTRAMUSCULAR

## 2021-12-04 ENCOUNTER — Ambulatory Visit (INDEPENDENT_AMBULATORY_CARE_PROVIDER_SITE_OTHER): Payer: Medicare HMO

## 2021-12-04 DIAGNOSIS — Z9181 History of falling: Secondary | ICD-10-CM

## 2021-12-04 DIAGNOSIS — E785 Hyperlipidemia, unspecified: Secondary | ICD-10-CM

## 2021-12-04 NOTE — Chronic Care Management (AMB) (Signed)
Chronic Care Management   CCM RN Visit Note  12/04/2021 Name: [name] MRN: [id number] DOB: 08/27/1941  Subjective: [name] is a 81 y.o. year old female who is a primary care patient of Steele Sizer, MD. The care management team was consulted for assistance with disease management and care coordination needs.    Engaged with patient by telephone for follow up visit in response to provider referral for case management and care coordination services.   Consent to Services:  The patient was given information about Chronic Care Management services, agreed to services, and gave verbal consent prior to initiation of services.  Please see initial visit note for detailed documentation.   Assessment: Review of patient past medical history, allergies, medications, health status, including review of consultants reports, laboratory and other test data, was performed as part of comprehensive evaluation and provision of chronic care management services.   SDOH (Social Determinants of Health) assessments and interventions performed: No  CCM Care Plan  Allergies  Allergen Reactions   Biphosphate    Codeine     headaches   Combigan [Brimonidine Tartrate-Timolol]     drowsy   Sulfa Antibiotics Nausea And Vomiting and Nausea Only   Metronidazole Itching and Rash    Outpatient Encounter Medications as of 12/04/2021  Medication Sig   acetaminophen (TYLENOL) 500 MG tablet Take 500 mg by mouth every 6 (six) hours as needed.   ALPHAGAN P 0.1 % SOLN Place 1 drop into both eyes 3 (three) times daily.    bimatoprost (LUMIGAN) 0.01 % SOLN Place 1 drop into both eyes at bedtime.   famotidine (PEPCID) 20 MG tablet Take 1 tablet (20 mg total) by mouth 2 (two) times daily.   fluticasone (FLONASE) 50 MCG/ACT nasal spray SPRAY 2 SPRAYS INTO EACH NOSTRIL EVERY DAY   Lifitegrast (XIIDRA) 5 % SOLN Apply to eye.   Polyethyl Glycol-Propyl Glycol 0.4-0.3 % SOLN Apply to eye. Using Refresh  Mega-3   pravastatin (PRAVACHOL) 20 MG tablet TAKE 1 TABLET BY MOUTH EVERY DAY   PREMARIN vaginal cream Place 1 application vaginally 2 (two) times a week.   No facility-administered encounter medications on file as of 12/04/2021.    Patient Active Problem List   Diagnosis Date Noted   Impaired fasting glucose    Fall at home, initial encounter 01/27/2021   Overweight (BMI 25.0-29.9) 04/03/2020   S/P right TKA 04/02/2020   Status post total knee replacement, right 04/02/2020   Presence of right artificial knee joint 04/02/2020   Senile purpura ([place]) 12/30/2018   S/P vaginal hysterectomy 05/30/2018   Recurrent major depressive disorder, in full remission ([place]) 03/02/2018   GERD (gastroesophageal reflux disease) 10/20/2016   Anxiety, generalized 01/28/2016   Abnormal brain CT 06/20/2015   Brain lesion 06/20/2015   Dyslipidemia 06/20/2015   Vertigo 06/20/2015   Benign hypertension 06/20/2015   Arthralgia of shoulder 06/20/2015   Glaucoma 06/20/2015   Post-menopausal 06/04/2015    Patient Care Plan: RN Care Management Plan of Care     Problem Identified: HLD  and Fall Risk      Long-Range Goal: Disease Progression Prevented or Minimized   Start Date: 10/08/2021  Expected End Date: 01/06/2022  Priority: High  Note:   Current Barriers:  Chronic Disease Management support and education needs related to HLD and Fall Risk.  RNCM Clinical Goal(s):  Patient will demonstrate ongoing adherence to prescribed treatment plan for HLD and Fall Risk through collaboration with the provider, RN Care Manager and  the care team.   Interventions: 1:1 collaboration with primary care provider regarding development and update of comprehensive plan of care as evidenced by provider attestation and co-signature Inter-disciplinary care team collaboration (see longitudinal plan of care) Evaluation of current treatment plan related to  self management and patient's adherence to plan as established by  provider   Falls Interventions:   Reviewed measures related to safety and fall prevention. Reports significant improvements with ambulation and following safety precautions. Denies falls. Reports going to the local gym and engaging in low impact exercise. She has returned to work 5 days a week. Reports doing very well. No further interventions needed.   Hyperlipidemia Interventions:   Lab Results  Component Value Date   CHOL 161 05/30/2021   HDL 77 05/30/2021   LDLCALC 64 05/30/2021   TRIG 114 05/30/2021   CHOLHDL 2.1 05/30/2021    Medications reviewed. Reports taking medications as prescribed and tolerating regimen. Reviewed provider established cholesterol goals. Reviewed importance of regular laboratory monitoring as prescribed Reviewed role and benefits of statin for ASCVD risk reduction Reviewed importance of limiting foods high in cholesterol Reviewed exercise goals. Advised to continue engaging in low impact exercises as tolerated.  Patient Goals/Self-Care Activities: Take all medications as prescribed Attend all scheduled provider appointments Call pharmacy for medication refills 3-7 days in advance of running out of medications Call provider office for new concerns or questions   Follow Up Plan:   Will follow up next month      PLAN A member of the care management team will follow up next month.   [name] Care Management [facility] [phone]

## 2021-12-10 ENCOUNTER — Other Ambulatory Visit: Payer: Self-pay

## 2021-12-10 ENCOUNTER — Ambulatory Visit (INDEPENDENT_AMBULATORY_CARE_PROVIDER_SITE_OTHER): Payer: Medicare HMO

## 2021-12-10 ENCOUNTER — Other Ambulatory Visit: Payer: Self-pay | Admitting: Family Medicine

## 2021-12-10 DIAGNOSIS — J3489 Other specified disorders of nose and nasal sinuses: Secondary | ICD-10-CM

## 2021-12-10 DIAGNOSIS — E538 Deficiency of other specified B group vitamins: Secondary | ICD-10-CM

## 2021-12-10 MED ORDER — CYANOCOBALAMIN 1000 MCG/ML IJ SOLN
1000.0000 ug | Freq: Once | INTRAMUSCULAR | Status: AC
  Administered 2021-12-10: 1000 ug via INTRAMUSCULAR

## 2021-12-10 NOTE — Progress Notes (Signed)
Patient tolerated injection well with no adverse reaction immediatly noticed.

## 2021-12-11 NOTE — Telephone Encounter (Signed)
Requested Prescriptions  Pending Prescriptions Disp Refills   fluticasone (FLONASE) 50 MCG/ACT nasal spray [Pharmacy Med Name: FLUTICASONE PROP 50 MCG SPRAY] 48 mL 0    Sig: SPRAY 2 SPRAYS INTO EACH NOSTRIL EVERY DAY     Ear, Nose, and Throat: Nasal Preparations - Corticosteroids Passed - 12/10/2021  8:00 PM      Passed - Valid encounter within last 12 months    Recent Outpatient Visits          1 week ago Major depression, recurrent, chronic ([place])   [facility] Steele Sizer, MD   4 months ago Need for immunization against influenza   [facility] Steele Sizer, MD   6 months ago Age-related osteoporosis without current pathological fracture   [facility] Steele Sizer, MD   9 months ago History of total left hip replacement   [facility] Steele Sizer, MD   1 year ago Viral upper respiratory tract infection   [place], [place]      Future Appointments            In 4 months  [facility], [place]   In 5 months Steele Sizer, MD [facility], [facility]

## 2021-12-16 DIAGNOSIS — E785 Hyperlipidemia, unspecified: Secondary | ICD-10-CM

## 2021-12-17 ENCOUNTER — Ambulatory Visit (INDEPENDENT_AMBULATORY_CARE_PROVIDER_SITE_OTHER): Payer: Medicare HMO | Admitting: Internal Medicine

## 2021-12-17 DIAGNOSIS — R051 Acute cough: Secondary | ICD-10-CM | POA: Diagnosis not present

## 2021-12-17 DIAGNOSIS — R0981 Nasal congestion: Secondary | ICD-10-CM

## 2021-12-17 LAB — POC INFLUENZA A&B (BINAX/QUICKVUE)
Influenza A, POC: NEGATIVE
Influenza B, POC: NEGATIVE

## 2021-12-17 MED ORDER — CORICIDIN HBP 10-325-2 MG PO TABS: 1.0000 | ORAL_TABLET | Freq: Every day | ORAL | 0 refills | Status: DC

## 2021-12-17 MED ORDER — BENZONATATE 100 MG PO CAPS: 100.0000 mg | ORAL_CAPSULE | Freq: Two times a day (BID) | ORAL | 0 refills | Status: DC | PRN

## 2021-12-17 NOTE — Progress Notes (Signed)
Virtual Visit via Telephone Note  I connected with [name] on 12/17/21 at 11:40 AM EST by telephone and verified that I am speaking with the correct person using two identifiers.  Location: Patient: Home Provider: [facility]   I discussed the limitations, risks, security and privacy concerns of performing an evaluation and management service by telephone and the availability of in person appointments. I also discussed with the patient that there may be a patient responsible charge related to this service. The patient expressed understanding and agreed to proceed.   History of Present Illness:  [name] is a 81 year old female presenting with cold symptoms. Symptoms started 3 days ago.   URI Compliant:  -Worst symptom: nasal congestion, cough, headache  -Fever: no -Cough: yes, dry -Shortness of breath: no -Wheezing: no -Chest pain: yes, with cough -Chest tightness: no -Chest congestion: no -Nasal congestion: yes, thick and clear -Runny nose: yes -Post nasal drip: yes -Sore throat: yes -Sinus pressure: yes -Headache: yes -Face pain: no -Ear pain: no    -Ear pressure: no    -Vomiting: no -Sick contacts: no -Context: stable -Relief with OTC cold/cough medications: no  -Treatments attempted: mucinex, Nasal saline, flonase   Observations/Objective:  General: in no acute distress Pulm: dry cough heard over the phone Neuro: answers all questions appropriately   Assessment and Plan:  1. Acute cough/Nasal congestion: Test for COVID and flu today. Treat symptomatically with nasal saline, Flonase, cough suppressant. Stop Mucinex, can do decongestant safe for blood pressure while congested. Follow up if symptoms worsen or fail to improve.  - benzonatate (TESSALON) 100 MG capsule; Take 1 capsule (100 mg total) by mouth 2 (two) times daily as needed for cough.  Dispense: 20 capsule; Refill: 0 - POC Influenza A&B (Binax test) - DM-APAP-CPM (CORICIDIN HBP)  10-325-2 MG TABS; Take 1 each by mouth daily.  Dispense: 30 tablet; Refill: 0  Follow Up Instructions: PRN    I discussed the assessment and treatment plan with the patient. The patient was provided an opportunity to ask questions and all were answered. The patient agreed with the plan and demonstrated an understanding of the instructions.   The patient was advised to call back or seek an in-person evaluation if the symptoms worsen or if the condition fails to improve as anticipated.  I provided 14 minutes of non-face-to-face time during this encounter.   Teodora Medici, DO

## 2021-12-18 LAB — NOVEL CORONAVIRUS, NAA: SARS-CoV-2, NAA: NOT DETECTED

## 2021-12-24 ENCOUNTER — Ambulatory Visit: Payer: Medicare HMO

## 2021-12-24 ENCOUNTER — Other Ambulatory Visit: Payer: Self-pay

## 2021-12-24 ENCOUNTER — Ambulatory Visit (INDEPENDENT_AMBULATORY_CARE_PROVIDER_SITE_OTHER): Payer: Medicare HMO

## 2021-12-24 DIAGNOSIS — E538 Deficiency of other specified B group vitamins: Secondary | ICD-10-CM | POA: Diagnosis not present

## 2021-12-24 MED ORDER — CYANOCOBALAMIN 1000 MCG/ML IJ SOLN
1000.0000 ug | Freq: Once | INTRAMUSCULAR | Status: AC
  Administered 2021-12-24: 1000 ug via INTRAMUSCULAR

## 2021-12-28 ENCOUNTER — Other Ambulatory Visit: Payer: Self-pay | Admitting: Family Medicine

## 2021-12-28 DIAGNOSIS — K219 Gastro-esophageal reflux disease without esophagitis: Secondary | ICD-10-CM

## 2021-12-30 DIAGNOSIS — H16143 Punctate keratitis, bilateral: Secondary | ICD-10-CM | POA: Diagnosis not present

## 2022-01-07 ENCOUNTER — Ambulatory Visit (INDEPENDENT_AMBULATORY_CARE_PROVIDER_SITE_OTHER): Payer: Medicare HMO

## 2022-01-07 ENCOUNTER — Ambulatory Visit: Payer: Medicare HMO

## 2022-01-07 DIAGNOSIS — E538 Deficiency of other specified B group vitamins: Secondary | ICD-10-CM

## 2022-01-07 MED ORDER — CYANOCOBALAMIN 1000 MCG/ML IJ SOLN
1000.0000 ug | Freq: Once | INTRAMUSCULAR | Status: AC
  Administered 2022-01-07: 1000 ug via INTRAMUSCULAR

## 2022-01-08 ENCOUNTER — Ambulatory Visit (INDEPENDENT_AMBULATORY_CARE_PROVIDER_SITE_OTHER): Payer: Medicare HMO

## 2022-01-08 DIAGNOSIS — E785 Hyperlipidemia, unspecified: Secondary | ICD-10-CM

## 2022-01-08 DIAGNOSIS — R051 Acute cough: Secondary | ICD-10-CM

## 2022-01-08 DIAGNOSIS — Z9181 History of falling: Secondary | ICD-10-CM

## 2022-01-08 DIAGNOSIS — R0981 Nasal congestion: Secondary | ICD-10-CM

## 2022-01-08 NOTE — Chronic Care Management (AMB) (Signed)
Chronic Care Management   CCM RN Visit Note  01/08/2022 Name: [name] MRN: [id number] DOB: 01-Jun-1941  Subjective: [name] is a 81 y.o. year old female who is a primary care patient of Steele Sizer, MD. The care management team was consulted for assistance with disease management and care coordination needs.    Engaged with patient by telephone for follow up visit in response to provider referral for case management and care coordination services.   Consent to Services:  The patient was given information about Chronic Care Management services, agreed to services, and gave verbal consent prior to initiation of services.  Please see initial visit note for detailed documentation.    Assessment: Review of patient past medical history, allergies, medications, health status, including review of consultants reports, laboratory and other test data, was performed as part of comprehensive evaluation and provision of chronic care management services.   SDOH (Social Determinants of Health) assessments and interventions performed: No  CCM Care Plan  Allergies  Allergen Reactions   Biphosphate    Codeine     headaches   Combigan [Brimonidine Tartrate-Timolol]     drowsy   Sulfa Antibiotics Nausea And Vomiting and Nausea Only   Metronidazole Itching and Rash    Outpatient Encounter Medications as of 01/08/2022  Medication Sig   acetaminophen (TYLENOL) 500 MG tablet Take 500 mg by mouth every 6 (six) hours as needed.   ALPHAGAN P 0.1 % SOLN Place 1 drop into both eyes 3 (three) times daily.    benzonatate (TESSALON) 100 MG capsule Take 1 capsule (100 mg total) by mouth 2 (two) times daily as needed for cough.   bimatoprost (LUMIGAN) 0.01 % SOLN Place 1 drop into both eyes at bedtime.   DM-APAP-CPM (CORICIDIN HBP) 10-325-2 MG TABS Take 1 each by mouth daily.   famotidine (PEPCID) 20 MG tablet TAKE 1 TABLET BY MOUTH TWICE A DAY   fluticasone (FLONASE) 50 MCG/ACT nasal  spray SPRAY 2 SPRAYS INTO EACH NOSTRIL EVERY DAY   Lifitegrast (XIIDRA) 5 % SOLN Apply to eye.   oxybutynin (DITROPAN-XL) 5 MG 24 hr tablet Take 5 mg by mouth daily.   Polyethyl Glycol-Propyl Glycol 0.4-0.3 % SOLN Apply to eye. Using Refresh Mega-3   pravastatin (PRAVACHOL) 20 MG tablet TAKE 1 TABLET BY MOUTH EVERY DAY   PREMARIN vaginal cream Place 1 application vaginally 2 (two) times a week.   No facility-administered encounter medications on file as of 01/08/2022.    Patient Active Problem List   Diagnosis Date Noted   Impaired fasting glucose    Fall at home, initial encounter 01/27/2021   Overweight (BMI 25.0-29.9) 04/03/2020   S/P right TKA 04/02/2020   Status post total knee replacement, right 04/02/2020   Presence of right artificial knee joint 04/02/2020   Senile purpura ([place]) 12/30/2018   S/P vaginal hysterectomy 05/30/2018   Recurrent major depressive disorder, in full remission ([place]) 03/02/2018   GERD (gastroesophageal reflux disease) 10/20/2016   Anxiety, generalized 01/28/2016   Abnormal brain CT 06/20/2015   Brain lesion 06/20/2015   Dyslipidemia 06/20/2015   Vertigo 06/20/2015   Benign hypertension 06/20/2015   Arthralgia of shoulder 06/20/2015   Glaucoma 06/20/2015   Post-menopausal 06/04/2015    Patient Care Plan: RN Care Management Plan of Care     Problem Identified: HLD  and Fall Risk      Long-Range Goal: Disease Progression Prevented or Minimized   Priority: High  Note:   Current Barriers:  Chronic  Disease Management support and education needs related to HLD and Fall Risk.  RNCM Clinical Goal(s):  Patient will demonstrate ongoing adherence to prescribed treatment plan for HLD and Fall Risk through collaboration with the provider, [place] and the care team.   Interventions: 1:1 collaboration with primary care provider regarding development and update of comprehensive plan of care as evidenced by provider attestation and  co-signature Inter-disciplinary care team collaboration (see longitudinal plan of care) Evaluation of current treatment plan related to  self management and patient's adherence to plan as established by provider   Falls Interventions:  (Goal Met) Reviewed safety and fall prevention measures. Denies falls since last outreach. Reports ambulating well and using recommended precautions. Discussed activity tolerance. Reports engaging in low impact exercises without difficulty. Reports decreasing workdays to three days a week. No changed in activity tolerance.   Hyperlipidemia Interventions:   Reviewed plan for hyperlipidemia management. Report excellent compliance with medications. Tolerating pravastatin well. Reviewed provider established cholesterol goals. Levels are currently within range. Discussed importance of completing labs as ordered. Reviewed importance of limiting foods high in cholesterol. Reports doing well with nutritional intake. Advised to continue monitoring nutrition labels and avoid highly processed foods when possible. Reviewed exercise goals. Continues engaging in low impact activity without difficulty. Remains very motivate to improve her overall health.  Cough (New Goal) Discussed plan for treatment on ongoing cough. She was evaluated in the clinic earlier this month for an acute cough. Reports cough has improved. Still experiences several episodes during the week. She was recently hoarse d/t coughing spells. Denies sore throat or fever. Reports the cough is nonproductive. Unable to recall identifiable triggers. Denies episodes of shortness of breath. Reports taking over the counter medications and avoiding medications that may elevate blood pressure. Reports maintaining adequate hydration. Thoroughly reviewed worsening symptoms and indications for medical follow up.   Patient Goals/Self-Care Activities: Take all medications as prescribed Attend all scheduled provider  appointments Call pharmacy for medication refills 3-7 days in advance of running out of medications Call provider office for new concerns or questions   Follow Up Plan:   Will follow up in July      PLAN A member of the care management team will follow up in July.   [name] Care Management [facility] [phone]

## 2022-01-08 NOTE — Patient Instructions (Signed)
Thank you for allowing the Chronic Care Management team to participate in your care. It was great speaking with you today!  Our next appointment is on June 04, 2022 at 3:45. Please call the care guide team at [phone] if you need to cancel or reschedule your appointment.   Please do not hesitate to contact me if you require assistance prior to our next scheduled outreach.   [name] Care Management [facility] [phone]

## 2022-01-13 DIAGNOSIS — E785 Hyperlipidemia, unspecified: Secondary | ICD-10-CM

## 2022-01-21 ENCOUNTER — Ambulatory Visit (INDEPENDENT_AMBULATORY_CARE_PROVIDER_SITE_OTHER): Payer: Medicare HMO

## 2022-01-21 ENCOUNTER — Other Ambulatory Visit: Payer: Self-pay

## 2022-01-21 DIAGNOSIS — E538 Deficiency of other specified B group vitamins: Secondary | ICD-10-CM | POA: Diagnosis not present

## 2022-01-21 MED ORDER — CYANOCOBALAMIN 1000 MCG/ML IJ SOLN
1000.0000 ug | Freq: Once | INTRAMUSCULAR | Status: AC
  Administered 2022-01-21: 1000 ug via INTRAMUSCULAR

## 2022-01-22 ENCOUNTER — Other Ambulatory Visit: Payer: Self-pay | Admitting: Family Medicine

## 2022-01-22 DIAGNOSIS — K219 Gastro-esophageal reflux disease without esophagitis: Secondary | ICD-10-CM

## 2022-02-04 ENCOUNTER — Other Ambulatory Visit: Payer: Self-pay

## 2022-02-04 ENCOUNTER — Ambulatory Visit (INDEPENDENT_AMBULATORY_CARE_PROVIDER_SITE_OTHER): Payer: Medicare HMO

## 2022-02-04 DIAGNOSIS — E538 Deficiency of other specified B group vitamins: Secondary | ICD-10-CM | POA: Diagnosis not present

## 2022-02-04 MED ORDER — CYANOCOBALAMIN 1000 MCG/ML IJ SOLN
1000.0000 ug | Freq: Once | INTRAMUSCULAR | Status: AC
  Administered 2022-02-04: 1000 ug via INTRAMUSCULAR

## 2022-02-18 ENCOUNTER — Ambulatory Visit: Payer: Medicare HMO

## 2022-02-19 ENCOUNTER — Other Ambulatory Visit: Payer: Self-pay | Admitting: Family Medicine

## 2022-02-19 DIAGNOSIS — K219 Gastro-esophageal reflux disease without esophagitis: Secondary | ICD-10-CM

## 2022-03-03 ENCOUNTER — Other Ambulatory Visit: Payer: Self-pay | Admitting: Family Medicine

## 2022-03-03 DIAGNOSIS — J3489 Other specified disorders of nose and nasal sinuses: Secondary | ICD-10-CM

## 2022-03-04 ENCOUNTER — Ambulatory Visit: Payer: Medicare HMO

## 2022-03-04 ENCOUNTER — Ambulatory Visit (INDEPENDENT_AMBULATORY_CARE_PROVIDER_SITE_OTHER): Payer: Medicare HMO

## 2022-03-04 DIAGNOSIS — E538 Deficiency of other specified B group vitamins: Secondary | ICD-10-CM | POA: Diagnosis not present

## 2022-03-04 MED ORDER — CYANOCOBALAMIN 1000 MCG/ML IJ SOLN
1000.0000 ug | Freq: Once | INTRAMUSCULAR | Status: AC
Start: 2022-03-04 — End: 2022-03-04
  Administered 2022-03-04: 1000 ug via INTRAMUSCULAR

## 2022-03-04 NOTE — Progress Notes (Signed)
Patient tolerated injection well with no adverse effects immediately noted.  ?

## 2022-03-18 ENCOUNTER — Ambulatory Visit (INDEPENDENT_AMBULATORY_CARE_PROVIDER_SITE_OTHER): Payer: Medicare HMO

## 2022-03-18 DIAGNOSIS — E538 Deficiency of other specified B group vitamins: Secondary | ICD-10-CM

## 2022-03-18 MED ORDER — CYANOCOBALAMIN 1000 MCG/ML IJ SOLN
1000.0000 ug | Freq: Once | INTRAMUSCULAR | Status: AC
Start: 2022-03-18 — End: 2022-03-18
  Administered 2022-03-18: 1000 ug via INTRAMUSCULAR

## 2022-03-21 ENCOUNTER — Other Ambulatory Visit: Payer: Self-pay | Admitting: Family Medicine

## 2022-03-21 DIAGNOSIS — K219 Gastro-esophageal reflux disease without esophagitis: Secondary | ICD-10-CM

## 2022-03-30 ENCOUNTER — Encounter: Payer: Self-pay | Admitting: Family Medicine

## 2022-03-30 ENCOUNTER — Ambulatory Visit (INDEPENDENT_AMBULATORY_CARE_PROVIDER_SITE_OTHER): Payer: Medicare HMO | Admitting: Family Medicine

## 2022-03-30 VITALS — BP 126/78 | HR 97 | Temp 98.3°F | Resp 16 | Ht 62.0 in | Wt 143.0 lb

## 2022-03-30 DIAGNOSIS — J069 Acute upper respiratory infection, unspecified: Secondary | ICD-10-CM | POA: Diagnosis not present

## 2022-03-30 DIAGNOSIS — R051 Acute cough: Secondary | ICD-10-CM | POA: Diagnosis not present

## 2022-03-30 MED ORDER — PREDNISONE 20 MG PO TABS: 40.0000 mg | ORAL_TABLET | Freq: Every day | ORAL | 0 refills | Status: AC

## 2022-03-30 MED ORDER — BENZONATATE 100 MG PO CAPS: 100.0000 mg | ORAL_CAPSULE | Freq: Three times a day (TID) | ORAL | 1 refills | Status: DC | PRN

## 2022-03-30 MED ORDER — LORATADINE 10 MG PO TABS: 10.0000 mg | ORAL_TABLET | Freq: Every day | ORAL | 1 refills | Status: DC

## 2022-03-30 NOTE — Progress Notes (Signed)
? ? ?[name], female    DOB: November 10, 1941, 81 y.o.   MRN: [id number] ? ?PCP: Steele Sizer, MD ? ?Chief Complaint  ?Patient presents with  ? Cough  ?  X3 days  ? ? ?Subjective:  ? ?[name] is a 81 y.o. female, presents to clinic with CC of the following: ? ?HPI  ?Pt presents for URI sx and nonproductive cough.  Nasal sx and sore throat sx slightly improved after the first few days of sx, she felt awful yesterday and is slightly better today.  She's got sore ribs and feels tired from coughing but denies CP/pleuritic CP, SOB, wheeze, DOE, fever sweats.  States she gets this every year.  Denies hx of asthma or COPD.  Last sx were about 3 months ago tx with mucinex and tessalon ? ?Patient Active Problem List  ? Diagnosis Date Noted  ? Impaired fasting glucose   ? Fall at home, initial encounter 01/27/2021  ? Overweight (BMI 25.0-29.9) 04/03/2020  ? S/P right TKA 04/02/2020  ? Status post total knee replacement, right 04/02/2020  ? Presence of right artificial knee joint 04/02/2020  ? Senile purpura ([place]) 12/30/2018  ? S/P vaginal hysterectomy 05/30/2018  ? Recurrent major depressive disorder, in full remission ([place]) 03/02/2018  ? GERD (gastroesophageal reflux disease) 10/20/2016  ? Anxiety, generalized 01/28/2016  ? Abnormal brain CT 06/20/2015  ? Brain lesion 06/20/2015  ? Dyslipidemia 06/20/2015  ? Vertigo 06/20/2015  ? Benign hypertension 06/20/2015  ? Arthralgia of shoulder 06/20/2015  ? Glaucoma 06/20/2015  ? Post-menopausal 06/04/2015  ? ? ? ? ?Current Outpatient Medications:  ?  acetaminophen (TYLENOL) 500 MG tablet, Take 500 mg by mouth every 6 (six) hours as needed., Disp: , Rfl:  ?  ALPHAGAN P 0.1 % SOLN, Place 1 drop into both eyes 3 (three) times daily. , Disp: , Rfl:  ?  benzonatate (TESSALON) 100 MG capsule, Take 1 capsule (100 mg total) by mouth 2 (two) times daily as needed for cough., Disp: 20 capsule, Rfl: 0 ?  bimatoprost (LUMIGAN) 0.01 % SOLN, Place 1 drop into both  eyes at bedtime., Disp: , Rfl:  ?  DM-APAP-CPM (CORICIDIN HBP) 10-325-2 MG TABS, Take 1 each by mouth daily., Disp: 30 tablet, Rfl: 0 ?  famotidine (PEPCID) 20 MG tablet, TAKE 1 TABLET BY MOUTH TWICE A DAY, Disp: 60 tablet, Rfl: 1 ?  fluticasone (FLONASE) 50 MCG/ACT nasal spray, SPRAY 2 SPRAYS INTO EACH NOSTRIL EVERY DAY, Disp: 48 mL, Rfl: 0 ?  Lifitegrast (XIIDRA) 5 % SOLN, Apply to eye., Disp: , Rfl:  ?  loratadine (CLARITIN) 10 MG tablet, Take 1 tablet (10 mg total) by mouth at bedtime., Disp: 90 tablet, Rfl: 1 ?  oxybutynin (DITROPAN-XL) 5 MG 24 hr tablet, Take 5 mg by mouth daily., Disp: , Rfl:  ?  Polyethyl Glycol-Propyl Glycol 0.4-0.3 % SOLN, Apply to eye. Using Refresh Mega-3, Disp: , Rfl:  ?  pravastatin (PRAVACHOL) 20 MG tablet, TAKE 1 TABLET BY MOUTH EVERY DAY, Disp: 90 tablet, Rfl: 1 ?  PREMARIN vaginal cream, Place 1 application vaginally 2 (two) times a week., Disp: , Rfl: 12 ? ? ?Allergies  ?Allergen Reactions  ? Biphosphate   ? Codeine   ?  headaches  ? Combigan [Brimonidine Tartrate-Timolol]   ?  drowsy  ? Sulfa Antibiotics Nausea And Vomiting and Nausea Only  ? Metronidazole Itching and Rash  ? ? ? ?Social History  ? ?Tobacco Use  ? Smoking status: Never  ?  Smokeless tobacco: Never  ? Tobacco comments:  ?  smoking cessation materials not required  ?Vaping Use  ? Vaping Use: Never used  ?Substance Use Topics  ? Alcohol use: Yes  ?  Alcohol/week: 0.0 standard drinks  ?  Comment: occasional beer/mixed drink  ? Drug use: No  ?  ? ? ?Chart Review Today: ?I personally reviewed active problem list, medication list, allergies, family history, social history, health maintenance, notes from last encounter, lab results, imaging with the patient/caregiver today. ? ? ?Review of Systems ?10 Systems reviewed and are negative for acute change except as noted in the HPI. ? ?   ?Objective:  ? ?Vitals:  ? 03/30/22 1140  ?BP: 126/78  ?Pulse: 97  ?Resp: 16  ?Temp: 98.3 ?F (36.8 ?C)  ?SpO2: 98%  ?Weight: 143 lb (64.9  kg)  ?Height: '5\' 2"'$  (1.575 m)  ?  ?Body mass index is 26.16 kg/m?. ? ?Physical Exam ?Vitals and nursing note reviewed.  ?Constitutional:   ?   General: She is not in acute distress. ?   Appearance: Normal appearance. She is normal weight. She is not ill-appearing, toxic-appearing or diaphoretic.  ?HENT:  ?   Head: Normocephalic.  ?   Right Ear: Tympanic membrane, ear canal and external ear normal.  ?   Left Ear: Tympanic membrane, ear canal and external ear normal.  ?   Nose: Rhinorrhea present. No congestion.  ?   Mouth/Throat:  ?   Mouth: Mucous membranes are dry.  ?   Pharynx: Oropharynx is clear. No oropharyngeal exudate.  ?Eyes:  ?   General: No scleral icterus.    ?   Right eye: No discharge.     ?   Left eye: No discharge.  ?   Conjunctiva/sclera: Conjunctivae normal.  ?Cardiovascular:  ?   Rate and Rhythm: Normal rate and regular rhythm.  ?   Pulses: Normal pulses.  ?   Heart sounds: Normal heart sounds. No murmur heard. ?  No friction rub. No gallop.  ?Pulmonary:  ?   Effort: Pulmonary effort is normal. No tachypnea, accessory muscle usage or respiratory distress.  ?   Breath sounds: No stridor or transmitted upper airway sounds. Rhonchi present. No decreased breath sounds, wheezing or rales.  ?   Comments: Few coughing fits in the room associated with deep inspiration ?Chest:  ?   Chest wall: No tenderness.  ?Abdominal:  ?   General: Bowel sounds are normal.  ?   Palpations: Abdomen is soft.  ?Neurological:  ?   Mental Status: She is alert.  ?Psychiatric:     ?   Mood and Affect: Mood normal.  ?  ? ?Results for orders placed or performed in visit on 12/17/21  ?Novel Coronavirus, NAA ([facility])  ? Specimen: Nasopharyngeal(NP) swabs in vial transport medium  ? Nasopharynge  Previous  ?Result Value Ref Range  ? SARS-CoV-2, NAA Not Detected Not Detected  ?POC Influenza A&B ([facility] test)  ?Result Value Ref Range  ? Influenza A, POC Negative Negative  ? Influenza B, POC Negative Negative  ? ? ?   ?Assessment &  Plan:  ? ?81 y/o female presents with 4+ days of URI sx, some sx starting to improve but still coughing.  No fever, chills, sweats, body aches, CP, SOB, wheeze.  A few coughing fits in the room with deep breathing and some rhonchi and cleared with coughing.  Suspect acute bronchitis.   ? ?1. Acute cough ?Encouraged to keep doing otc meds, refill  on tessalon, add steroids ?If any worsening encouraged her to call us and get CXR ?VSS, pt overall well appearing, no increased WOB.   ?Pt may benefit from inhaler but she's never had before.  Will see how she does with steroids. ?Close f/up if not improving ?- predniSONE (DELTASONE) 20 MG tablet; Take 2 tablets (40 mg total) by mouth daily with breakfast for 5 days.  Dispense: 10 tablet; Refill: 0 ?- benzonatate (TESSALON) 100 MG capsule; Take 1-2 capsules (100-200 mg total) by mouth 3 (three) times daily as needed for cough.  Dispense: 30 capsule; Refill: 1 ?- DG Chest 2 View; Future ? ?2. Upper respiratory tract infection, unspecified type ?May be viral or allergic - recurrent sx, not on allergy meds, try adding antihistamine for 2 weeks and see if that helps at all with postnasal drip ?- loratadine (CLARITIN) 10 MG tablet; Take 1 tablet (10 mg total) by mouth at bedtime.  Dispense: 90 tablet; Refill: 1 ?- benzonatate (TESSALON) 100 MG capsule; Take 1-2 capsules (100-200 mg total) by mouth 3 (three) times daily as needed for cough.  Dispense: 30 capsule; Refill: 1 ? ? ? ? ?Delsa Grana, PA-C ?03/30/22 12:00 PM ? ?

## 2022-03-30 NOTE — Patient Instructions (Signed)
You can continue to take mucinex ?Start the allergy medication once daily at bedtime - try it for 2-3 weeks to try and limit allergy triggers and possibly some of the drainage that keeps leading to this cough. ? ?You can keep taking tessalon perles - I sent more in ? ?If you have continued cough or have any worsening symptoms then go to outpatient imaging and get the Xray taken ?

## 2022-04-01 ENCOUNTER — Ambulatory Visit (INDEPENDENT_AMBULATORY_CARE_PROVIDER_SITE_OTHER): Payer: Medicare HMO

## 2022-04-01 DIAGNOSIS — E538 Deficiency of other specified B group vitamins: Secondary | ICD-10-CM

## 2022-04-01 MED ORDER — CYANOCOBALAMIN 1000 MCG/ML IJ SOLN
1000.0000 ug | Freq: Once | INTRAMUSCULAR | Status: AC
  Administered 2022-04-01: 1000 ug via INTRAMUSCULAR

## 2022-04-07 NOTE — Progress Notes (Unsigned)
Name: [name]   MRN: [id number]    DOB: 12-11-1940   Date:04/08/2022       Progress Note  Subjective  Chief Complaint  Follow Up  HPI  History of fracture of left hip : she fell at home, missed one of her garage steps on 01/27/2021, she called 911 and was transported to [facility], she had Total left hip replacement surgery on February 16, 2021 by Dr. Rudene Christians. Since she fell she has been very cautious and afraid of falling again, she needs to use another person or a wall to help her steady herself when going up or down stairs. We will refer to PT   Low B12: she used to take B12 every two weeks but insurance started to only approve for once a months, advised we can try again once a month and recheck levels next visit   HTN: she has been off Losartan since hip replacement surgery back in 02/16/2021 bp has been well controlled.   Hyperlipidemia: taking pravastatin daily now , last LDL was at goal at 64. Denies myalgia. Continue medication    Depression Major Chronic: she still misses her son , that died at age 2 from [place] back in 2013-02-16.( Her husband died in 2009-02-16  on the same day on October 14 th). She has two grandchildren from him and two other grandchildren from her youngest son. She states she really struggles from October until the end of December. Phq 9 is high today at 10.  She tried Buspar but she did not like it and stopped. She states she has been missing her son Gerald Stabs more than usual. She has noticed lack of motivation - not going to the gym. Crying more often. She states her first child was a stillborn and today she would have been 81 yo.  Discussed grieve counseling through hospice . Still not ready for counseling    GERD: she has stopped omeprazole but is now on Pepcid twice daily and indigestion has resolved    Hyperglycemia: A1C was 5.8% but last visit was 5.4%  Denies polyphagia, polydipsia or polyuria. She has episodes of hypoglycemia and family history of diabetes, she gets light  headed when she skips meals, but doing very well lately .    Senile purpura: on both arms, stable. Reassurance given   Osteoporosis: she states she was given Boniva during hospital stay but she states it caused indigestion and also bone pain, it has happened in the past also, she had Reclast infusion 10/2021 at Dr. Sherren Mocha office   Patient Active Problem List   Diagnosis Date Noted   Impaired fasting glucose    Fall at home, initial encounter 01/27/2021   Overweight (BMI 25.0-29.9) 04/03/2020   S/P right TKA 04/02/2020   Status post total knee replacement, right 04/02/2020   Presence of right artificial knee joint 04/02/2020   Senile purpura ([place]) 12/30/2018   S/P vaginal hysterectomy 05/30/2018   Recurrent major depressive disorder, in full remission ([place]) 03/02/2018   GERD (gastroesophageal reflux disease) 10/20/2016   Anxiety, generalized 01/28/2016   Abnormal brain CT 06/20/2015   Brain lesion 06/20/2015   Dyslipidemia 06/20/2015   Vertigo 06/20/2015   Benign hypertension 06/20/2015   Arthralgia of shoulder 06/20/2015   Glaucoma 06/20/2015   Post-menopausal 06/04/2015    Past Surgical History:  Procedure Laterality Date   ABDOMINAL HYSTERECTOMY     ANAL FISSURE REPAIR  1970s   for bleeding in 1970s   ANTERIOR AND POSTERIOR REPAIR WITH SACROSPINOUS FIXATION  N/A 05/30/2018   Procedure: ANTERIOR AND POSTERIOR REPAIR;  Surgeon: Louretta Shorten, MD;  Location: [facility];  Service: Gynecology;  Laterality: N/A;   bilateral cataracts removed Bilateral    COLONOSCOPY  2012   [facility]   COLONOSCOPY WITH PROPOFOL N/A 09/01/2016   Procedure: COLONOSCOPY WITH PROPOFOL;  Surgeon: Robert Bellow, MD;  Location: [facility];  Service: Endoscopy;  Laterality: N/A;   COLONOSCOPY WITH PROPOFOL N/A 09/10/2021   Procedure: COLONOSCOPY WITH PROPOFOL;  Surgeon: Robert Bellow, MD;  Location: [facility];  Service: Endoscopy;  Laterality: N/A;   EYE SURGERY Bilateral    cataracts  removed   KNEE SURGERY Left    SALPINGOOPHORECTOMY Right 05/30/2018   Procedure: SALPINGO OOPHORECTOMY;  Surgeon: Louretta Shorten, MD;  Location: [facility];  Service: Gynecology;  Laterality: Right;   SUBCLAVIAN ANGIOGRAM     TOTAL HIP ARTHROPLASTY Left 01/28/2021   Procedure: TOTAL HIP ARTHROPLASTY ANTERIOR APPROACH;  Surgeon: Hessie Knows, MD;  Location: [facility];  Service: Orthopedics;  Laterality: Left;   TOTAL KNEE ARTHROPLASTY Right 04/02/2020   Procedure: TOTAL KNEE ARTHROPLASTY;  Surgeon: Paralee Cancel, MD;  Location: [facility];  Service: Orthopedics;  Laterality: Right;  70 mins   UPPER GI ENDOSCOPY  2012   VAGINAL HYSTERECTOMY N/A 05/30/2018   Procedure: HYSTERECTOMY VAGINAL;  Surgeon: Louretta Shorten, MD;  Location: [facility];  Service: Gynecology;  Laterality: N/A;   WISDOM TOOTH EXTRACTION      Family History  Problem Relation Age of Onset   Cancer Mother 62       colon   Alzheimer's disease Mother    Hypertension Mother    Prostate cancer Father    Hypertension Father    Cerebral aneurysm Father    Diabetes Sister    Depression Sister    Hypertension Sister    Depression Sister    Depression Brother    Cancer Brother        prostate   Cancer Brother    Gallbladder disease Brother    Prostate cancer Brother    Lymphoma Son    Cancer Son 76       lymphoma   Breast cancer Neg Hx     Social History   Tobacco Use   Smoking status: Never   Smokeless tobacco: Never   Tobacco comments:    smoking cessation materials not required  Substance Use Topics   Alcohol use: Yes    Alcohol/week: 0.0 standard drinks    Comment: occasional beer/mixed drink     Current Outpatient Medications:    acetaminophen (TYLENOL) 500 MG tablet, Take 500 mg by mouth every 6 (six) hours as needed., Disp: , Rfl:    ALPHAGAN P 0.1 % SOLN, Place 1 drop into both eyes 3 (three) times daily. , Disp: , Rfl:    benzonatate (TESSALON) 100 MG capsule, Take 1-2 capsules (100-200 mg total) by mouth 3  (three) times daily as needed for cough., Disp: 30 capsule, Rfl: 1   bimatoprost (LUMIGAN) 0.01 % SOLN, Place 1 drop into both eyes at bedtime., Disp: , Rfl:    DM-APAP-CPM (CORICIDIN HBP) 10-325-2 MG TABS, Take 1 each by mouth daily., Disp: 30 tablet, Rfl: 0   famotidine (PEPCID) 20 MG tablet, TAKE 1 TABLET BY MOUTH TWICE A DAY, Disp: 60 tablet, Rfl: 1   fluticasone (FLONASE) 50 MCG/ACT nasal spray, SPRAY 2 SPRAYS INTO EACH NOSTRIL EVERY DAY, Disp: 48 mL, Rfl: 0   Lifitegrast (XIIDRA) 5 % SOLN, Apply to eye., Disp: ,  Rfl:    loratadine (CLARITIN) 10 MG tablet, Take 1 tablet (10 mg total) by mouth at bedtime., Disp: 90 tablet, Rfl: 1   oxybutynin (DITROPAN-XL) 5 MG 24 hr tablet, Take 5 mg by mouth daily., Disp: , Rfl:    Polyethyl Glycol-Propyl Glycol 0.4-0.3 % SOLN, Apply to eye. Using Refresh Mega-3, Disp: , Rfl:    pravastatin (PRAVACHOL) 20 MG tablet, TAKE 1 TABLET BY MOUTH EVERY DAY, Disp: 90 tablet, Rfl: 1   PREMARIN vaginal cream, Place 1 application vaginally 2 (two) times a week., Disp: , Rfl: 12  Allergies  Allergen Reactions   Biphosphate    Codeine     headaches   Combigan [Brimonidine Tartrate-Timolol]     drowsy   Sulfa Antibiotics Nausea And Vomiting and Nausea Only   Metronidazole Itching and Rash    I personally reviewed active problem list, medication list, allergies, family history, social history, health maintenance with the patient/caregiver today.   ROS  Constitutional: Negative for fever or weight change.  Respiratory: Negative for cough and shortness of breath.   Cardiovascular: Negative for chest pain or palpitations.  Gastrointestinal: Negative for abdominal pain, no bowel changes.  Musculoskeletal: Negative for gait problem or joint swelling.  Skin: Negative for rash.  Neurological: Negative for dizziness or headache.  No other specific complaints in a complete review of systems (except as listed in HPI above).   Objective  Vitals:   04/08/22 1347   BP: 132/76  Pulse: 88  Resp: 16  SpO2: 97%  Weight: 143 lb (64.9 kg)  Height: '5\' 2"'$  (1.575 m)    Body mass index is 26.16 kg/m.  Physical Exam  Constitutional: Patient appears well-developed and well-nourished.  No distress.  HEENT: head atraumatic, normocephalic, pupils equal and reactive to light, neck supple Cardiovascular: Normal rate, regular rhythm and normal heart sounds.  No murmur heard. No BLE edema. Pulmonary/Chest: Effort normal and breath sounds normal. No respiratory distress. Abdominal: Soft.  There is no tenderness. Psychiatric: Patient has a normal mood and affect. behavior is normal. Judgment and thought content normal.    PHQ2/9:    04/08/2022    1:47 PM 03/30/2022   11:39 AM 12/17/2021    8:54 AM 12/02/2021    2:42 PM 09/01/2021    1:59 PM  Depression screen PHQ 2/9  Decreased Interest 1 0 0 0 0  Down, Depressed, Hopeless 3 0 0 1 1  PHQ - 2 Score 4 0 0 1 1  Altered sleeping 3  0 0   Tired, decreased energy 3  0 2   Change in appetite 0  0 0   Feeling bad or failure about yourself  0  0 0   Trouble concentrating 0  0 1   Moving slowly or fidgety/restless 0  0 0   Suicidal thoughts 0  0 0   PHQ-9 Score 10  0 4   Difficult doing work/chores   Not difficult at all      phq 9 is positive   Fall Risk:    04/08/2022    1:47 PM 03/30/2022   11:39 AM 12/17/2021    8:54 AM 12/02/2021    2:42 PM 08/06/2021    3:40 PM  Fall Risk   Falls in the past year? 0 0 0 0 1  Number falls in past yr: 0 0 0 0 0  Injury with Fall? 0 0 0 0 1  Risk for fall due to : No Fall Risks No Fall  Risks No Fall Risks No Fall Risks History of fall(s);Other (Comment)  Risk for fall due to: Comment     Hx of Left Hip Replacement  Follow up Falls prevention discussed Falls prevention discussed Falls prevention discussed Falls prevention discussed Falls prevention discussed      Functional Status Survey: Is the patient deaf or have difficulty hearing?: No Does the patient have  difficulty seeing, even when wearing glasses/contacts?: No Does the patient have difficulty concentrating, remembering, or making decisions?: No Does the patient have difficulty walking or climbing stairs?: No Does the patient have difficulty dressing or bathing?: No Does the patient have difficulty doing errands alone such as visiting a doctor's office or shopping?: No    Assessment & Plan   Problem List Items Addressed This Visit     Dyslipidemia    Continue statin therapy        Relevant Medications   pravastatin (PRAVACHOL) 20 MG tablet   History of hypertension    She has been off medication, losartan , since 2022 when she fell, bp has been controlled       Primary osteoarthritis of right knee   Gastroesophageal reflux disease without esophagitis    Doing well on Pepcid        Relevant Medications   famotidine (PEPCID) 20 MG tablet   Major depression, recurrent, chronic ([place])    She is willing to try Citalopram       Relevant Medications   citalopram (CELEXA) 10 MG tablet   Menopausal osteoporosis    Getting Reclast , first dose 12/22       B12 deficiency    Go down to B12 once a month        History of total left hip replacement   Relevant Orders   Ambulatory referral to Physical Therapy   Gait instability   Relevant Orders   Ambulatory referral to Physical Therapy   Senile purpura ([place]) - Primary   Relevant Medications   pravastatin (PRAVACHOL) 20 MG tablet   Other Visit Diagnoses     At risk for falls       Relevant Orders   Ambulatory referral to Physical Therapy

## 2022-04-08 ENCOUNTER — Encounter: Payer: Self-pay | Admitting: Family Medicine

## 2022-04-08 ENCOUNTER — Ambulatory Visit (INDEPENDENT_AMBULATORY_CARE_PROVIDER_SITE_OTHER): Payer: Medicare HMO | Admitting: Family Medicine

## 2022-04-08 VITALS — BP 132/76 | HR 88 | Resp 16 | Ht 62.0 in | Wt 143.0 lb

## 2022-04-08 DIAGNOSIS — R69 Illness, unspecified: Secondary | ICD-10-CM | POA: Diagnosis not present

## 2022-04-08 DIAGNOSIS — Z9181 History of falling: Secondary | ICD-10-CM | POA: Diagnosis not present

## 2022-04-08 DIAGNOSIS — M810 Age-related osteoporosis without current pathological fracture: Secondary | ICD-10-CM

## 2022-04-08 DIAGNOSIS — Z8679 Personal history of other diseases of the circulatory system: Secondary | ICD-10-CM | POA: Diagnosis not present

## 2022-04-08 DIAGNOSIS — K219 Gastro-esophageal reflux disease without esophagitis: Secondary | ICD-10-CM | POA: Diagnosis not present

## 2022-04-08 DIAGNOSIS — F339 Major depressive disorder, recurrent, unspecified: Secondary | ICD-10-CM

## 2022-04-08 DIAGNOSIS — E538 Deficiency of other specified B group vitamins: Secondary | ICD-10-CM

## 2022-04-08 DIAGNOSIS — D692 Other nonthrombocytopenic purpura: Secondary | ICD-10-CM

## 2022-04-08 DIAGNOSIS — M1711 Unilateral primary osteoarthritis, right knee: Secondary | ICD-10-CM | POA: Diagnosis not present

## 2022-04-08 DIAGNOSIS — Z96642 Presence of left artificial hip joint: Secondary | ICD-10-CM

## 2022-04-08 DIAGNOSIS — R2681 Unsteadiness on feet: Secondary | ICD-10-CM

## 2022-04-08 DIAGNOSIS — E785 Hyperlipidemia, unspecified: Secondary | ICD-10-CM

## 2022-04-08 MED ORDER — CITALOPRAM HYDROBROMIDE 10 MG PO TABS: 10.0000 mg | ORAL_TABLET | Freq: Every day | ORAL | 1 refills | Status: DC

## 2022-04-08 MED ORDER — PRAVASTATIN SODIUM 20 MG PO TABS: 20.0000 mg | ORAL_TABLET | Freq: Every day | ORAL | 1 refills | Status: DC

## 2022-04-08 MED ORDER — FAMOTIDINE 20 MG PO TABS: 20.0000 mg | ORAL_TABLET | Freq: Two times a day (BID) | ORAL | 1 refills | Status: DC

## 2022-04-08 NOTE — Assessment & Plan Note (Signed)
Continue statin therapy.

## 2022-04-08 NOTE — Assessment & Plan Note (Signed)
Go down to B12 once a month

## 2022-04-08 NOTE — Assessment & Plan Note (Signed)
Getting Reclast , first dose 12/22

## 2022-04-08 NOTE — Assessment & Plan Note (Addendum)
She has been off medication, losartan , since 2022 when she fell, bp has been controlled

## 2022-04-08 NOTE — Assessment & Plan Note (Signed)
She is willing to try Citalopram

## 2022-04-08 NOTE — Assessment & Plan Note (Signed)
Doing well on Pepcid

## 2022-04-15 ENCOUNTER — Ambulatory Visit: Payer: Medicare HMO

## 2022-04-16 DIAGNOSIS — R2681 Unsteadiness on feet: Secondary | ICD-10-CM | POA: Diagnosis not present

## 2022-04-16 DIAGNOSIS — M6281 Muscle weakness (generalized): Secondary | ICD-10-CM | POA: Diagnosis not present

## 2022-04-21 DIAGNOSIS — M6281 Muscle weakness (generalized): Secondary | ICD-10-CM | POA: Diagnosis not present

## 2022-04-21 DIAGNOSIS — R2681 Unsteadiness on feet: Secondary | ICD-10-CM | POA: Diagnosis not present

## 2022-04-23 ENCOUNTER — Ambulatory Visit: Payer: Medicare HMO

## 2022-04-23 DIAGNOSIS — R2681 Unsteadiness on feet: Secondary | ICD-10-CM | POA: Diagnosis not present

## 2022-04-23 DIAGNOSIS — M6281 Muscle weakness (generalized): Secondary | ICD-10-CM | POA: Diagnosis not present

## 2022-04-27 DIAGNOSIS — R2681 Unsteadiness on feet: Secondary | ICD-10-CM | POA: Diagnosis not present

## 2022-04-27 DIAGNOSIS — M6281 Muscle weakness (generalized): Secondary | ICD-10-CM | POA: Diagnosis not present

## 2022-04-30 ENCOUNTER — Other Ambulatory Visit: Payer: Self-pay | Admitting: Family Medicine

## 2022-04-30 DIAGNOSIS — R2681 Unsteadiness on feet: Secondary | ICD-10-CM | POA: Diagnosis not present

## 2022-04-30 DIAGNOSIS — M6281 Muscle weakness (generalized): Secondary | ICD-10-CM | POA: Diagnosis not present

## 2022-04-30 DIAGNOSIS — F339 Major depressive disorder, recurrent, unspecified: Secondary | ICD-10-CM

## 2022-05-04 ENCOUNTER — Ambulatory Visit (INDEPENDENT_AMBULATORY_CARE_PROVIDER_SITE_OTHER): Payer: Medicare HMO

## 2022-05-04 DIAGNOSIS — H401132 Primary open-angle glaucoma, bilateral, moderate stage: Secondary | ICD-10-CM | POA: Diagnosis not present

## 2022-05-04 DIAGNOSIS — E538 Deficiency of other specified B group vitamins: Secondary | ICD-10-CM | POA: Diagnosis not present

## 2022-05-04 MED ORDER — CYANOCOBALAMIN 1000 MCG/ML IJ SOLN
1000.0000 ug | Freq: Once | INTRAMUSCULAR | Status: AC
  Administered 2022-05-04: 1000 ug via INTRAMUSCULAR

## 2022-05-05 DIAGNOSIS — R2681 Unsteadiness on feet: Secondary | ICD-10-CM | POA: Diagnosis not present

## 2022-05-05 DIAGNOSIS — H401132 Primary open-angle glaucoma, bilateral, moderate stage: Secondary | ICD-10-CM | POA: Diagnosis not present

## 2022-05-05 DIAGNOSIS — M6281 Muscle weakness (generalized): Secondary | ICD-10-CM | POA: Diagnosis not present

## 2022-05-07 DIAGNOSIS — M6281 Muscle weakness (generalized): Secondary | ICD-10-CM | POA: Diagnosis not present

## 2022-05-07 DIAGNOSIS — R2681 Unsteadiness on feet: Secondary | ICD-10-CM | POA: Diagnosis not present

## 2022-05-11 DIAGNOSIS — R2681 Unsteadiness on feet: Secondary | ICD-10-CM | POA: Diagnosis not present

## 2022-05-11 DIAGNOSIS — M6281 Muscle weakness (generalized): Secondary | ICD-10-CM | POA: Diagnosis not present

## 2022-05-14 DIAGNOSIS — M6281 Muscle weakness (generalized): Secondary | ICD-10-CM | POA: Diagnosis not present

## 2022-05-14 DIAGNOSIS — R2681 Unsteadiness on feet: Secondary | ICD-10-CM | POA: Diagnosis not present

## 2022-05-20 ENCOUNTER — Other Ambulatory Visit: Payer: Self-pay | Admitting: Family Medicine

## 2022-05-20 DIAGNOSIS — J3489 Other specified disorders of nose and nasal sinuses: Secondary | ICD-10-CM

## 2022-05-21 ENCOUNTER — Ambulatory Visit: Payer: Medicare HMO

## 2022-05-27 NOTE — Progress Notes (Signed)
Name: [name]   MRN: [id number]    DOB: 01/14/1941   Date:06/01/2022       Progress Note  Subjective  Chief Complaint  Follow Up  HPI   Depression Major Chronic: she still misses her son , that died at age 82 from [place] back in 02-18-2013.( Her husband died in 02/18/09  on the same day on October 14 th). She has two grandchildren from him and two other grandchildren from her youngest son. She states she really struggles from October until the end of December. Phq 9  was 10, we tried Celexa 10 mg May 2023  and she states feeling better but she does not have any motivation , discussed going up on Celexa or adding Wellbutrin and she chose the later. Discussed possible side effects. She denies personal history of seizures  GERD: she is taking Pepcid but continues to have to clear her throat, we will try omeprazole.   Gait instability: doing better since she had PT and is still doing home exercises    Patient Active Problem List   Diagnosis Date Noted   Menopausal osteoporosis 04/08/2022   B12 deficiency 04/08/2022   History of total left hip replacement 04/08/2022   Gait instability 04/08/2022   Impaired fasting glucose    Overweight (BMI 25.0-29.9) 04/03/2020   S/P right TKA 04/02/2020   Status post total knee replacement, right 04/02/2020   Presence of right artificial knee joint 04/02/2020   Senile purpura ([place]) 12/30/2018   S/P vaginal hysterectomy 05/30/2018   Major depression, recurrent, chronic ([place]) 03/02/2018   Gastroesophageal reflux disease without esophagitis 10/20/2016   Anxiety, generalized 02-19-2016   Primary osteoarthritis of right knee 12/25/2015   Abnormal brain CT 06/20/2015   Dyslipidemia 06/20/2015   Vertigo 06/20/2015   History of hypertension 06/20/2015   Arthralgia of shoulder 06/20/2015   Glaucoma 06/20/2015   Post-menopausal 06/04/2015    Past Surgical History:  Procedure Laterality Date   ABDOMINAL HYSTERECTOMY     ANAL FISSURE REPAIR  1970s    for bleeding in 1970s   ANTERIOR AND POSTERIOR REPAIR WITH SACROSPINOUS FIXATION N/A 05/30/2018   Procedure: ANTERIOR AND POSTERIOR REPAIR;  Surgeon: Louretta Shorten, MD;  Location: [facility];  Service: Gynecology;  Laterality: N/A;   bilateral cataracts removed Bilateral    COLONOSCOPY  02/19/2011   [facility]   COLONOSCOPY WITH PROPOFOL N/A 09/01/2016   Procedure: COLONOSCOPY WITH PROPOFOL;  Surgeon: Robert Bellow, MD;  Location: [facility];  Service: Endoscopy;  Laterality: N/A;   COLONOSCOPY WITH PROPOFOL N/A 09/10/2021   Procedure: COLONOSCOPY WITH PROPOFOL;  Surgeon: Robert Bellow, MD;  Location: [facility];  Service: Endoscopy;  Laterality: N/A;   EYE SURGERY Bilateral    cataracts removed   KNEE SURGERY Left    SALPINGOOPHORECTOMY Right 05/30/2018   Procedure: SALPINGO OOPHORECTOMY;  Surgeon: Louretta Shorten, MD;  Location: [facility];  Service: Gynecology;  Laterality: Right;   SUBCLAVIAN ANGIOGRAM     TOTAL HIP ARTHROPLASTY Left 01/28/2021   Procedure: TOTAL HIP ARTHROPLASTY ANTERIOR APPROACH;  Surgeon: Hessie Knows, MD;  Location: [facility];  Service: Orthopedics;  Laterality: Left;   TOTAL KNEE ARTHROPLASTY Right 04/02/2020   Procedure: TOTAL KNEE ARTHROPLASTY;  Surgeon: Paralee Cancel, MD;  Location: [facility];  Service: Orthopedics;  Laterality: Right;  70 mins   UPPER GI ENDOSCOPY  2011/02/19   VAGINAL HYSTERECTOMY N/A 05/30/2018   Procedure: HYSTERECTOMY VAGINAL;  Surgeon: Louretta Shorten, MD;  Location: [facility];  Service: Gynecology;  Laterality: N/A;   WISDOM TOOTH EXTRACTION      Family History  Problem Relation Age of Onset   Cancer Mother 27       colon   Alzheimer's disease Mother    Hypertension Mother    Prostate cancer Father    Hypertension Father    Cerebral aneurysm Father    Diabetes Sister    Depression Sister    Hypertension Sister    Depression Sister    Depression Brother    Cancer Brother        prostate   Cancer Brother    Gallbladder disease Brother     Prostate cancer Brother    Lymphoma Son    Cancer Son 69       lymphoma   Breast cancer Neg Hx     Social History   Tobacco Use   Smoking status: Never   Smokeless tobacco: Never   Tobacco comments:    smoking cessation materials not required  Substance Use Topics   Alcohol use: Yes    Alcohol/week: 0.0 standard drinks of alcohol    Comment: occasional beer/mixed drink     Current Outpatient Medications:    acetaminophen (TYLENOL) 500 MG tablet, Take 500 mg by mouth every 6 (six) hours as needed., Disp: , Rfl:    ALPHAGAN P 0.1 % SOLN, Place 1 drop into both eyes 3 (three) times daily. , Disp: , Rfl:    bimatoprost (LUMIGAN) 0.01 % SOLN, Place 1 drop into both eyes at bedtime., Disp: , Rfl:    citalopram (CELEXA) 10 MG tablet, Take 1 tablet (10 mg total) by mouth daily., Disp: 30 tablet, Rfl: 1   DM-APAP-CPM (CORICIDIN HBP) 10-325-2 MG TABS, Take 1 each by mouth daily., Disp: 30 tablet, Rfl: 0   famotidine (PEPCID) 20 MG tablet, Take 1 tablet (20 mg total) by mouth 2 (two) times daily., Disp: 180 tablet, Rfl: 1   fluticasone (FLONASE) 50 MCG/ACT nasal spray, SPRAY 2 SPRAYS INTO EACH NOSTRIL EVERY DAY, Disp: 48 mL, Rfl: 0   loratadine (CLARITIN) 10 MG tablet, Take 1 tablet (10 mg total) by mouth at bedtime., Disp: 90 tablet, Rfl: 1   oxybutynin (DITROPAN-XL) 5 MG 24 hr tablet, Take 5 mg by mouth daily., Disp: , Rfl:    Polyethyl Glycol-Propyl Glycol 0.4-0.3 % SOLN, Apply to eye. Using Refresh Mega-3, Disp: , Rfl:    pravastatin (PRAVACHOL) 20 MG tablet, Take 1 tablet (20 mg total) by mouth daily., Disp: 90 tablet, Rfl: 1   PREMARIN vaginal cream, Place 1 application vaginally 2 (two) times a week., Disp: , Rfl: 12  Allergies  Allergen Reactions   Biphosphate    Codeine     headaches   Combigan [Brimonidine Tartrate-Timolol]     drowsy   Sulfa Antibiotics Nausea And Vomiting and Nausea Only   Metronidazole Itching and Rash    I personally reviewed active problem list,  medication list, allergies, family history, social history, health maintenance with the patient/caregiver today.   ROS  Ten systems reviewed and is negative except as mentioned in HPI   Objective  Vitals:   06/01/22 1358  BP: 128/74  Pulse: 76  Resp: 16  SpO2: 98%  Weight: 145 lb (65.8 kg)  Height: '5\' 2"'$  (1.575 m)    Body mass index is 26.52 kg/m.  Physical Exam  Constitutional: Patient appears well-developed and well-nourished.  No distress.  HEENT: head atraumatic, normocephalic, pupils equal and reactive to light, neck supple Cardiovascular: Normal  rate, regular rhythm and normal heart sounds.  No murmur heard. No BLE edema. Pulmonary/Chest: Effort normal and breath sounds normal. No respiratory distress. Abdominal: Soft.  There is no tenderness. Psychiatric: Patient has a normal mood and affect. behavior is normal. Judgment and thought content normal.    PHQ2/9:    06/01/2022    2:03 PM 04/08/2022    1:47 PM 03/30/2022   11:39 AM 12/17/2021    8:54 AM 12/02/2021    2:42 PM  Depression screen PHQ 2/9  Decreased Interest 3 1 0 0 0  Down, Depressed, Hopeless 0 3 0 0 1  PHQ - 2 Score 3 4 0 0 1  Altered sleeping 0 3  0 0  Tired, decreased energy 3 3  0 2  Change in appetite 0 0  0 0  Feeling bad or failure about yourself  0 0  0 0  Trouble concentrating 0 0  0 1  Moving slowly or fidgety/restless 0 0  0 0  Suicidal thoughts 0 0  0 0  PHQ-9 Score 6 10  0 4  Difficult doing work/chores    Not difficult at all     phq 9 is positive   Fall Risk:    06/01/2022    1:57 PM 04/08/2022    1:47 PM 03/30/2022   11:39 AM 12/17/2021    8:54 AM 12/02/2021    2:42 PM  Fall Risk   Falls in the past year? 0 0 0 0 0  Number falls in past yr: 0 0 0 0 0  Injury with Fall? 0 0 0 0 0  Risk for fall due to : No Fall Risks No Fall Risks No Fall Risks No Fall Risks No Fall Risks  Follow up Falls prevention discussed Falls prevention discussed Falls prevention discussed Falls  prevention discussed Falls prevention discussed      Functional Status Survey: Is the patient deaf or have difficulty hearing?: No Does the patient have difficulty seeing, even when wearing glasses/contacts?: No Does the patient have difficulty concentrating, remembering, or making decisions?: No Does the patient have difficulty walking or climbing stairs?: No Does the patient have difficulty dressing or bathing?: No Does the patient have difficulty doing errands alone such as visiting a doctor's office or shopping?: No    Assessment & Plan  1. Major depression, recurrent, chronic (HCC)  - buPROPion (WELLBUTRIN XL) 150 MG 24 hr tablet; Take 1 tablet (150 mg total) by mouth in the morning.  Dispense: 90 tablet; Refill: 0 - citalopram (CELEXA) 10 MG tablet; Take 1 tablet (10 mg total) by mouth daily.  Dispense: 90 tablet; Refill: 0  2. Gait instability  Doing well since she had PT   3. GERD without esophagitis  - omeprazole (PRILOSEC) 20 MG capsule; Take 1 capsule (20 mg total) by mouth daily.  Dispense: 90 capsule; Refill: 0

## 2022-05-30 ENCOUNTER — Other Ambulatory Visit: Payer: Self-pay | Admitting: Family Medicine

## 2022-05-30 DIAGNOSIS — F339 Major depressive disorder, recurrent, unspecified: Secondary | ICD-10-CM

## 2022-06-01 ENCOUNTER — Encounter: Payer: Self-pay | Admitting: Family Medicine

## 2022-06-01 ENCOUNTER — Ambulatory Visit: Payer: Medicare HMO

## 2022-06-01 ENCOUNTER — Ambulatory Visit (INDEPENDENT_AMBULATORY_CARE_PROVIDER_SITE_OTHER): Payer: Medicare HMO | Admitting: Family Medicine

## 2022-06-01 VITALS — BP 128/74 | HR 76 | Resp 16 | Ht 62.0 in | Wt 145.0 lb

## 2022-06-01 DIAGNOSIS — F339 Major depressive disorder, recurrent, unspecified: Secondary | ICD-10-CM | POA: Diagnosis not present

## 2022-06-01 DIAGNOSIS — R2681 Unsteadiness on feet: Secondary | ICD-10-CM | POA: Diagnosis not present

## 2022-06-01 DIAGNOSIS — K219 Gastro-esophageal reflux disease without esophagitis: Secondary | ICD-10-CM | POA: Diagnosis not present

## 2022-06-01 DIAGNOSIS — R69 Illness, unspecified: Secondary | ICD-10-CM | POA: Diagnosis not present

## 2022-06-01 MED ORDER — BUPROPION HCL ER (XL) 150 MG PO TB24: 150.0000 mg | ORAL_TABLET | Freq: Every morning | ORAL | 0 refills | Status: DC

## 2022-06-01 MED ORDER — CITALOPRAM HYDROBROMIDE 10 MG PO TABS: 10.0000 mg | ORAL_TABLET | Freq: Every day | ORAL | 0 refills | Status: DC

## 2022-06-01 MED ORDER — OMEPRAZOLE 20 MG PO CPDR: 20.0000 mg | DELAYED_RELEASE_CAPSULE | Freq: Every day | ORAL | 0 refills | Status: DC

## 2022-06-03 ENCOUNTER — Ambulatory Visit: Payer: Medicare HMO

## 2022-06-03 NOTE — Progress Notes (Signed)
Annual Wellness Visit     Patient: [name], Female    DOB: 01/26/1941, 81 y.o.   MRN: [id number]  Subjective  Chief Complaint  Patient presents with   Medicare Wellness    [name] is a 81 y.o. female who presents today for her Annual Wellness Visit. She reports consuming a general diet. The patient does not participate in regular exercise at present. She generally feels fairly well. She reports sleeping fairly well. She does not have additional problems to discuss today.   HPI  Vision:05/2022  and Dental: No current dental problems and No regular dental care Advised to go back for dental visit    Patient Active Problem List   Diagnosis Date Noted   Menopausal osteoporosis 04/08/2022   B12 deficiency 04/08/2022   History of total left hip replacement 04/08/2022   Gait instability 04/08/2022   Impaired fasting glucose    Overweight (BMI 25.0-29.9) 04/03/2020   S/P right TKA 04/02/2020   Status post total knee replacement, right 04/02/2020   Presence of right artificial knee joint 04/02/2020   Senile purpura ([place]) 12/30/2018   S/P vaginal hysterectomy 05/30/2018   Major depression, recurrent, chronic ([place]) 03/02/2018   Gastroesophageal reflux disease without esophagitis 10/20/2016   Anxiety, generalized 01/28/2016   Primary osteoarthritis of right knee 12/25/2015   Abnormal brain CT 06/20/2015   Dyslipidemia 06/20/2015   Vertigo 06/20/2015   History of hypertension 06/20/2015   Arthralgia of shoulder 06/20/2015   Glaucoma 06/20/2015   Post-menopausal 06/04/2015   Past Medical History:  Diagnosis Date   Anemia    Anxiety    no meds   Arthritis    hands, knees - no meds   Bilateral leg weakness    Complication of anesthesia    Difficult to arouse   Dental bridge present    perm lower front bottom bridge   Depression    no meds   Eyes swollen    and red - was seen by MD for possible allergy to new eye drops   GERD (gastroesophageal  reflux disease)    diet controlled - only takes med prn basis   Glaucoma    Hypercholesteremia    Hypertension    Osteoporosis    SVD (spontaneous vaginal delivery)    x 3 - only one currently living   Vertigo    Past Surgical History:  Procedure Laterality Date   ABDOMINAL HYSTERECTOMY     ANAL FISSURE REPAIR  1970s   for bleeding in 1970s   ANTERIOR AND POSTERIOR REPAIR WITH SACROSPINOUS FIXATION N/A 05/30/2018   Procedure: ANTERIOR AND POSTERIOR REPAIR;  Surgeon: Louretta Shorten, MD;  Location: [facility];  Service: Gynecology;  Laterality: N/A;   bilateral cataracts removed Bilateral    COLONOSCOPY  2012   [facility]   COLONOSCOPY WITH PROPOFOL N/A 09/01/2016   Procedure: COLONOSCOPY WITH PROPOFOL;  Surgeon: Robert Bellow, MD;  Location: [facility];  Service: Endoscopy;  Laterality: N/A;   COLONOSCOPY WITH PROPOFOL N/A 09/10/2021   Procedure: COLONOSCOPY WITH PROPOFOL;  Surgeon: Robert Bellow, MD;  Location: [facility];  Service: Endoscopy;  Laterality: N/A;   EYE SURGERY Bilateral    cataracts removed   KNEE SURGERY Left    SALPINGOOPHORECTOMY Right 05/30/2018   Procedure: SALPINGO OOPHORECTOMY;  Surgeon: Louretta Shorten, MD;  Location: [facility];  Service: Gynecology;  Laterality: Right;   SUBCLAVIAN ANGIOGRAM     TOTAL HIP ARTHROPLASTY Left 01/28/2021   Procedure: TOTAL HIP ARTHROPLASTY  ANTERIOR APPROACH;  Surgeon: Hessie Knows, MD;  Location: [facility];  Service: Orthopedics;  Laterality: Left;   TOTAL KNEE ARTHROPLASTY Right 04/02/2020   Procedure: TOTAL KNEE ARTHROPLASTY;  Surgeon: Paralee Cancel, MD;  Location: [facility];  Service: Orthopedics;  Laterality: Right;  70 mins   UPPER GI ENDOSCOPY  2012   VAGINAL HYSTERECTOMY N/A 05/30/2018   Procedure: HYSTERECTOMY VAGINAL;  Surgeon: Louretta Shorten, MD;  Location: [facility];  Service: Gynecology;  Laterality: N/A;   WISDOM TOOTH EXTRACTION     Social History   Tobacco Use   Smoking status: Never   Smokeless tobacco: Never    Tobacco comments:    smoking cessation materials not required  Vaping Use   Vaping Use: Never used  Substance Use Topics   Alcohol use: Yes    Alcohol/week: 0.0 standard drinks of alcohol    Comment: occasional beer/mixed drink   Drug use: No   Allergies  Allergen Reactions   Biphosphate    Codeine     headaches   Combigan [Brimonidine Tartrate-Timolol]     drowsy   Sulfa Antibiotics Nausea And Vomiting and Nausea Only   Metronidazole Itching and Rash      Medications: Outpatient Medications Prior to Visit  Medication Sig   acetaminophen (TYLENOL) 500 MG tablet Take 500 mg by mouth every 6 (six) hours as needed.   ALPHAGAN P 0.1 % SOLN Place 1 drop into both eyes 3 (three) times daily.    bimatoprost (LUMIGAN) 0.01 % SOLN Place 1 drop into both eyes at bedtime.   buPROPion (WELLBUTRIN XL) 150 MG 24 hr tablet Take 1 tablet (150 mg total) by mouth in the morning.   citalopram (CELEXA) 10 MG tablet Take 1 tablet (10 mg total) by mouth daily.   fluticasone (FLONASE) 50 MCG/ACT nasal spray SPRAY 2 SPRAYS INTO EACH NOSTRIL EVERY DAY   omeprazole (PRILOSEC) 20 MG capsule Take 1 capsule (20 mg total) by mouth daily.   oxybutynin (DITROPAN-XL) 5 MG 24 hr tablet Take 5 mg by mouth daily.   Polyethyl Glycol-Propyl Glycol 0.4-0.3 % SOLN Apply to eye. Using Refresh Mega-3   pravastatin (PRAVACHOL) 20 MG tablet Take 1 tablet (20 mg total) by mouth daily.   PREMARIN vaginal cream Place 1 application vaginally 2 (two) times a week.   [DISCONTINUED] loratadine (CLARITIN) 10 MG tablet Take 1 tablet (10 mg total) by mouth at bedtime.   [DISCONTINUED] famotidine (PEPCID) 20 MG tablet Take 1 tablet (20 mg total) by mouth 2 (two) times daily. (Patient not taking: Reported on 06/04/2022)   No facility-administered medications prior to visit.    Allergies  Allergen Reactions   Biphosphate    Codeine     headaches   Combigan [Brimonidine Tartrate-Timolol]     drowsy   Sulfa Antibiotics Nausea  And Vomiting and Nausea Only   Metronidazole Itching and Rash    Patient Care Team: Steele Sizer, MD as PCP - General (Family Medicine) Dingeldein, Remo Lipps, MD as Consulting Physician (Ophthalmology) Louretta Shorten, MD as Consulting Physician (Obstetrics and Gynecology) Neldon Labella, RN as Case Manager [place], [place], LCSW as Social Worker  ROS  Constitutional: Negative for fever or weight change.  Respiratory: Negative for cough and shortness of breath.   Cardiovascular: Negative for chest pain or palpitations.  Gastrointestinal: Negative for abdominal pain, no bowel changes.  Musculoskeletal: Negative for gait problem or joint swelling.  Skin: Negative for rash.  Neurological: Negative for dizziness or headache.  No other specific complaints  in a complete review of systems (except as listed in HPI above).     Objective  BP 118/76   Pulse 86   Resp 16   Ht '5\' 2"'$  (1.575 m)   Wt 147 lb (66.7 kg)   SpO2 95%   BMI 26.89 kg/m  BP Readings from Last 3 Encounters:  06/04/22 118/76  06/01/22 128/74  04/08/22 132/76      Physical Exam  Constitutional: Patient appears well-developed and well-nourished.  No distress.  HEENT: head atraumatic, normocephalic, pupils equal and reactive to light,  neck supple Cardiovascular: Normal rate, regular rhythm and normal heart sounds.  No murmur heard. No BLE edema. Pulmonary/Chest: Effort normal and breath sounds normal. No respiratory distress. Abdominal: Soft.  There is no tenderness. Psychiatric: Patient has a normal mood and affect. behavior is normal. Judgment and thought content normal.   Most recent functional status assessment:    06/04/2022    9:54 AM  In your present state of health, do you have any difficulty performing the following activities:  Hearing? 0  Vision? 0  Difficulty concentrating or making decisions? 0  Walking or climbing stairs? 0  Dressing or bathing? 0  Doing errands, shopping? 0   Most recent  fall risk assessment:    06/04/2022    9:54 AM  Fall Risk   Falls in the past year? 0  Number falls in past yr: 0  Injury with Fall? 0  Risk for fall due to : No Fall Risks  Follow up Falls prevention discussed    Most recent depression screenings:    06/04/2022   10:00 AM 06/01/2022    2:03 PM  PHQ 2/9 Scores  PHQ - 2 Score 2 3  PHQ- 9 Score 3 6   Most recent cognitive screening:    06/04/2022   10:29 AM  6CIT Screen  What Year? 0 points  What month? 0 points  What time? 0 points  Count back from 20 0 points  Months in reverse 0 points  Repeat phrase 0 points  Total Score 0 points   Most recent Audit-C alcohol use screening    06/04/2022   10:37 AM  Alcohol Use Disorder Test (AUDIT)  1. How often do you have a drink containing alcohol? 1  2. How many drinks containing alcohol do you have on a typical day when you are drinking? 0  3. How often do you have six or more drinks on one occasion? 0  AUDIT-C Score 1   A score of 3 or more in women, and 4 or more in men indicates increased risk for alcohol abuse, EXCEPT if all of the points are from question 1   Vision/Hearing Screen: Hearing Screening   '500Hz'$  '1000Hz'$  '2000Hz'$  '4000Hz'$   Right ear Pass Pass Pass Pass  Left ear Pass Pass Pass Pass    Last CBC Lab Results  Component Value Date   WBC 5.9 05/30/2021   HGB 12.9 05/30/2021   HCT 40.1 05/30/2021   MCV 84.1 05/30/2021   MCH 27.0 05/30/2021   RDW 14.1 05/30/2021   PLT 251 01/75/1025   Last metabolic panel Lab Results  Component Value Date   GLUCOSE 85 02/25/2021   NA 145 02/25/2021   K 4.4 02/25/2021   CL 109 02/25/2021   CO2 29 02/25/2021   BUN 11 02/25/2021   CREATININE 0.67 02/25/2021   GFRNONAA 83 02/25/2021   CALCIUM 9.1 02/25/2021   PROT 6.8 02/25/2021   ALBUMIN 4.2 05/18/2018  LABGLOB 2.3 09/13/2015   AGRATIO 1.8 09/13/2015   BILITOT 0.4 02/25/2021   ALKPHOS 50 05/18/2018   AST 13 02/25/2021   ALT 8 02/25/2021   ANIONGAP 4 (L) 01/31/2021    Last lipids Lab Results  Component Value Date   CHOL 161 05/30/2021   HDL 77 05/30/2021   LDLCALC 64 05/30/2021   TRIG 114 05/30/2021   CHOLHDL 2.1 05/30/2021   Last hemoglobin A1c Lab Results  Component Value Date   HGBA1C 5.4 01/01/2020   Last vitamin D Lab Results  Component Value Date   VD25OH 37 02/25/2021   Last vitamin B12 and Folate Lab Results  Component Value Date   VITAMINB12 404 07/23/2021   FOLATE 13.1 07/23/2021      Assessment & Plan    Exercise Activities and Dietary recommendations  She will go back to the gym Drink 64 oz of water a day  Go to the dentis   Immunization History  Administered Date(s) Administered   Fluad Quad(high Dose 65+) 09/05/2019, 08/26/2020, 07/23/2021   Influenza, High Dose Seasonal PF 12/25/2015, 10/20/2016, 10/15/2017, 12/30/2018   Influenza,inj,Quad PF,6+ Mos 09/04/2014   Moderna Sars-Covid-2 Vaccination 10/27/2020   PFIZER(Purple Top)SARS-COV-2 Vaccination 02/10/2020, 03/01/2020   Pneumococcal Conjugate-13 03/01/2017   Pneumococcal Polysaccharide-23 05/14/2014   Tdap 11/17/2011    Health Maintenance  Topic Date Due   Zoster Vaccines- Shingrix (1 of 2) Never done   COVID-19 Vaccine (4 - Pfizer series) 12/22/2020   TETANUS/TDAP  11/16/2021   INFLUENZA VACCINE  06/16/2022   Pneumonia Vaccine 46+ Years old  Completed   DEXA SCAN  Completed   HPV VACCINES  Aged Out     Discussed health benefits of physical activity, and encouraged her to engage in regular exercise appropriate for her age and condition.    Problem List Items Addressed This Visit     Dyslipidemia   Relevant Orders   Lipid panel   B12 deficiency   Relevant Orders   Vitamin B12   Other Visit Diagnoses     Encounter for Medicare annual wellness exam    -  Primary   Age-related osteoporosis without current pathological fracture       Relevant Orders   VITAMIN D 25 Hydroxy (Vit-D Deficiency, Fractures)   Essential hypertension        Relevant Orders   CBC with Differential/Platelet   COMPLETE METABOLIC PANEL WITH GFR   Hyperglycemia       Relevant Orders   Hemoglobin A1c      She will get shingrix and Tdap at local pharmacy  No follow-ups on file.     Loistine Chance, MD

## 2022-06-04 ENCOUNTER — Ambulatory Visit (INDEPENDENT_AMBULATORY_CARE_PROVIDER_SITE_OTHER): Payer: Medicare HMO | Admitting: Family Medicine

## 2022-06-04 ENCOUNTER — Encounter: Payer: Self-pay | Admitting: Family Medicine

## 2022-06-04 ENCOUNTER — Ambulatory Visit: Payer: Medicare HMO

## 2022-06-04 VITALS — BP 118/76 | HR 86 | Resp 16 | Ht 62.0 in | Wt 147.0 lb

## 2022-06-04 DIAGNOSIS — E785 Hyperlipidemia, unspecified: Secondary | ICD-10-CM

## 2022-06-04 DIAGNOSIS — Z0000 Encounter for general adult medical examination without abnormal findings: Secondary | ICD-10-CM

## 2022-06-04 DIAGNOSIS — R739 Hyperglycemia, unspecified: Secondary | ICD-10-CM | POA: Diagnosis not present

## 2022-06-04 DIAGNOSIS — Z23 Encounter for immunization: Secondary | ICD-10-CM | POA: Diagnosis not present

## 2022-06-04 DIAGNOSIS — H401132 Primary open-angle glaucoma, bilateral, moderate stage: Secondary | ICD-10-CM | POA: Diagnosis not present

## 2022-06-04 DIAGNOSIS — E538 Deficiency of other specified B group vitamins: Secondary | ICD-10-CM

## 2022-06-04 DIAGNOSIS — I10 Essential (primary) hypertension: Secondary | ICD-10-CM

## 2022-06-04 DIAGNOSIS — M810 Age-related osteoporosis without current pathological fracture: Secondary | ICD-10-CM

## 2022-06-04 DIAGNOSIS — H57813 Brow ptosis, bilateral: Secondary | ICD-10-CM | POA: Diagnosis not present

## 2022-06-04 MED ORDER — CYANOCOBALAMIN 1000 MCG/ML IJ SOLN
1000.0000 ug | Freq: Once | INTRAMUSCULAR | Status: AC
  Administered 2022-06-04: 1000 ug via INTRAMUSCULAR

## 2022-06-04 MED ORDER — TETANUS-DIPHTH-ACELL PERTUSSIS 5-2-15.5 LF-MCG/0.5 IM SUSP: 0.5000 mL | Freq: Once | INTRAMUSCULAR | 0 refills | Status: AC

## 2022-06-04 MED ORDER — SHINGRIX 50 MCG/0.5ML IM SUSR: 0.5000 mL | Freq: Once | INTRAMUSCULAR | 0 refills | Status: AC

## 2022-06-04 NOTE — Patient Instructions (Signed)
  [name] , Thank you for taking time to come for your Medicare Wellness Visit. I appreciate your ongoing commitment to your health goals. Please review the following plan we discussed and let me know if I can assist you in the future.   These are the goals we discussed:  Get dental exam Go back to the gym Drink 64 oz of water Get Tdap and shingrix at local pharmacy   This is a list of the screening recommended for you and due dates:  Health Maintenance  Topic Date Due   Zoster (Shingles) Vaccine (1 of 2) Never done   COVID-19 Vaccine (4 - Pfizer series) 12/22/2020   Tetanus Vaccine  11/16/2021   Flu Shot  06/16/2022   Pneumonia Vaccine  Completed   DEXA scan (bone density measurement)  Completed   HPV Vaccine  Aged Out

## 2022-06-04 NOTE — Chronic Care Management (AMB) (Signed)
      06/04/2022 Name: SHAQUAYLA KLIMAS MRN: [id number] DOB: 08/09/1941  Subjective: Faven Watterson Honold is a 81 y.o. year old female who is a primary care patient of Steele Sizer, MD.    Documentation encounter created to complete case transition. Care goals addressed.  The care management team will continue to follow for care coordination.   [facility] [phone]

## 2022-06-05 LAB — CBC WITH DIFFERENTIAL/PLATELET
Absolute Monocytes: 342 cells/uL (ref 200–950)
Basophils Absolute: 11 cells/uL (ref 0–200)
Basophils Relative: 0.2 %
Eosinophils Absolute: 168 cells/uL (ref 15–500)
Eosinophils Relative: 3 %
HCT: 38.5 % (ref 35.0–45.0)
Hemoglobin: 12.7 g/dL (ref 11.7–15.5)
Lymphs Abs: 1898 cells/uL (ref 850–3900)
MCH: 28.7 pg (ref 27.0–33.0)
MCHC: 33 g/dL (ref 32.0–36.0)
MCV: 86.9 fL (ref 80.0–100.0)
MPV: 10.2 fL (ref 7.5–12.5)
Monocytes Relative: 6.1 %
Neutro Abs: 3181 cells/uL (ref 1500–7800)
Neutrophils Relative %: 56.8 %
Platelets: 207 10*3/uL (ref 140–400)
RBC: 4.43 10*6/uL (ref 3.80–5.10)
RDW: 12.4 % (ref 11.0–15.0)
Total Lymphocyte: 33.9 %
WBC: 5.6 10*3/uL (ref 3.8–10.8)

## 2022-06-05 LAB — COMPLETE METABOLIC PANEL WITH GFR
AG Ratio: 1.9 (calc) (ref 1.0–2.5)
ALT: 12 U/L (ref 6–29)
AST: 15 U/L (ref 10–35)
Albumin: 4.4 g/dL (ref 3.6–5.1)
Alkaline phosphatase (APISO): 39 U/L (ref 37–153)
BUN: 16 mg/dL (ref 7–25)
CO2: 28 mmol/L (ref 20–32)
Calcium: 9.3 mg/dL (ref 8.6–10.4)
Chloride: 106 mmol/L (ref 98–110)
Creat: 0.77 mg/dL (ref 0.60–0.95)
Globulin: 2.3 g/dL (calc) (ref 1.9–3.7)
Glucose, Bld: 76 mg/dL (ref 65–99)
Potassium: 4.4 mmol/L (ref 3.5–5.3)
Sodium: 143 mmol/L (ref 135–146)
Total Bilirubin: 0.5 mg/dL (ref 0.2–1.2)
Total Protein: 6.7 g/dL (ref 6.1–8.1)
eGFR: 77 mL/min/{1.73_m2} (ref >=60)

## 2022-06-05 LAB — VITAMIN D 25 HYDROXY (VIT D DEFICIENCY, FRACTURES): Vit D, 25-Hydroxy: 40 ng/mL (ref 30–100)

## 2022-06-05 LAB — HEMOGLOBIN A1C
Hgb A1c MFr Bld: 5.3 % of total Hgb (ref <5.7)
Mean Plasma Glucose: 105 mg/dL
eAG (mmol/L): 5.8 mmol/L

## 2022-06-05 LAB — LIPID PANEL
Cholesterol: 169 mg/dL (ref <200)
HDL: 70 mg/dL (ref >=50)
LDL Cholesterol (Calc): 79 mg/dL (calc)
Non-HDL Cholesterol (Calc): 99 mg/dL (calc) (ref <130)
Total CHOL/HDL Ratio: 2.4 (calc) (ref <5.0)
Triglycerides: 121 mg/dL (ref <150)

## 2022-06-05 LAB — VITAMIN B12: Vitamin B-12: >2000 pg/mL — ABNORMAL HIGH (ref 200–1100)

## 2022-06-22 ENCOUNTER — Ambulatory Visit: Payer: Self-pay

## 2022-06-29 NOTE — Patient Instructions (Signed)
Visit Information Thank you for allowing the Care Management team to participate in your care. It was great speaking with you!  Following are the goals we discussed today:   Goals Addressed             This Visit's Progress    Health Maintenance       Care Coordination Interventions: Reviewed medications. Reports excellent compliance with medications. Denies current concerns regarding medication management or prescription cost.  Reviewed plan for disease management. Reports following recommended treatment plans. Attending provider appointments as scheduled.  AWV up to date. Completed on 06/04/22.          Our next appointment is by telephone on August 20, 2022 at 3 pm. Please call the care guide team at [phone] if you need to cancel or reschedule your appointment.   Mrs. Beadle verbalized understanding of information discussed during the telephonic outreach.  Declined need for mailed information or resources.    A member of the care management team will follow up in October.  [facility] [phone]

## 2022-06-29 NOTE — Patient Outreach (Signed)
  Care Coordination   Initial Visit Note    Name: LUMA CLOPPER MRN: [id number] DOB: 1941/07/06  Addyson Traub Trunnell is a 81 y.o. year old female who sees Steele Sizer, MD for primary care. I spoke with  Rande Brunt Polack by phone today  What matters to the patients health and wellness today?  Health Maintenance    Goals Addressed             This Visit's Progress    Health Maintenance       Care Coordination Interventions: Reviewed medications. Reports excellent compliance with medications. Denies current concerns regarding medication management or prescription cost.  Reviewed plan for disease management. Reports following recommended treatment plans. Attending provider appointments as scheduled.  AWV up to date. Completed on 06/04/22.          SDOH assessments and interventions completed:  Yes  SDOH Interventions Today    Flowsheet Row Most Recent Value  SDOH Interventions   Food Insecurity Interventions Intervention Not Indicated  Transportation Interventions Intervention Not Indicated        Care Coordination Interventions Activated:  Yes  Care Coordination Interventions:  Yes, provided   Follow up plan: Follow up call scheduled for August 20, 2022.    Encounter Outcome:  Pt. Visit Completed   [facility] [phone]

## 2022-07-06 ENCOUNTER — Ambulatory Visit (INDEPENDENT_AMBULATORY_CARE_PROVIDER_SITE_OTHER): Payer: Medicare HMO

## 2022-07-06 DIAGNOSIS — E538 Deficiency of other specified B group vitamins: Secondary | ICD-10-CM | POA: Diagnosis not present

## 2022-07-06 MED ORDER — CYANOCOBALAMIN 1000 MCG/ML IJ SOLN
1000.0000 ug | Freq: Once | INTRAMUSCULAR | Status: AC
  Administered 2022-07-06: 1000 ug via INTRAMUSCULAR

## 2022-07-06 NOTE — Progress Notes (Signed)
Patient tolerated injection well with no adverse reaction immediatly noted.

## 2022-07-07 ENCOUNTER — Other Ambulatory Visit: Payer: Self-pay | Admitting: Family Medicine

## 2022-07-07 DIAGNOSIS — Z1231 Encounter for screening mammogram for malignant neoplasm of breast: Secondary | ICD-10-CM

## 2022-07-09 IMAGING — DX DG HIP (WITH OR WITHOUT PELVIS) 2-3V*L*
2 series · 2 of 2 positions shown · non-contrast
Comparison: Plain films left hip 01/27/2021.

CLINICAL DATA: Intraoperative imaging for left hip replacement. The
patient suffered a left hip fracture in a fall 01/27/2021.

EXAM:
OPERATIVE LEFT HIP (WITH PELVIS IF PERFORMED) 2 VIEWS
TECHNIQUE: Fluoroscopic spot image(s) were submitted for interpretation
post-operatively.

[hip ap]
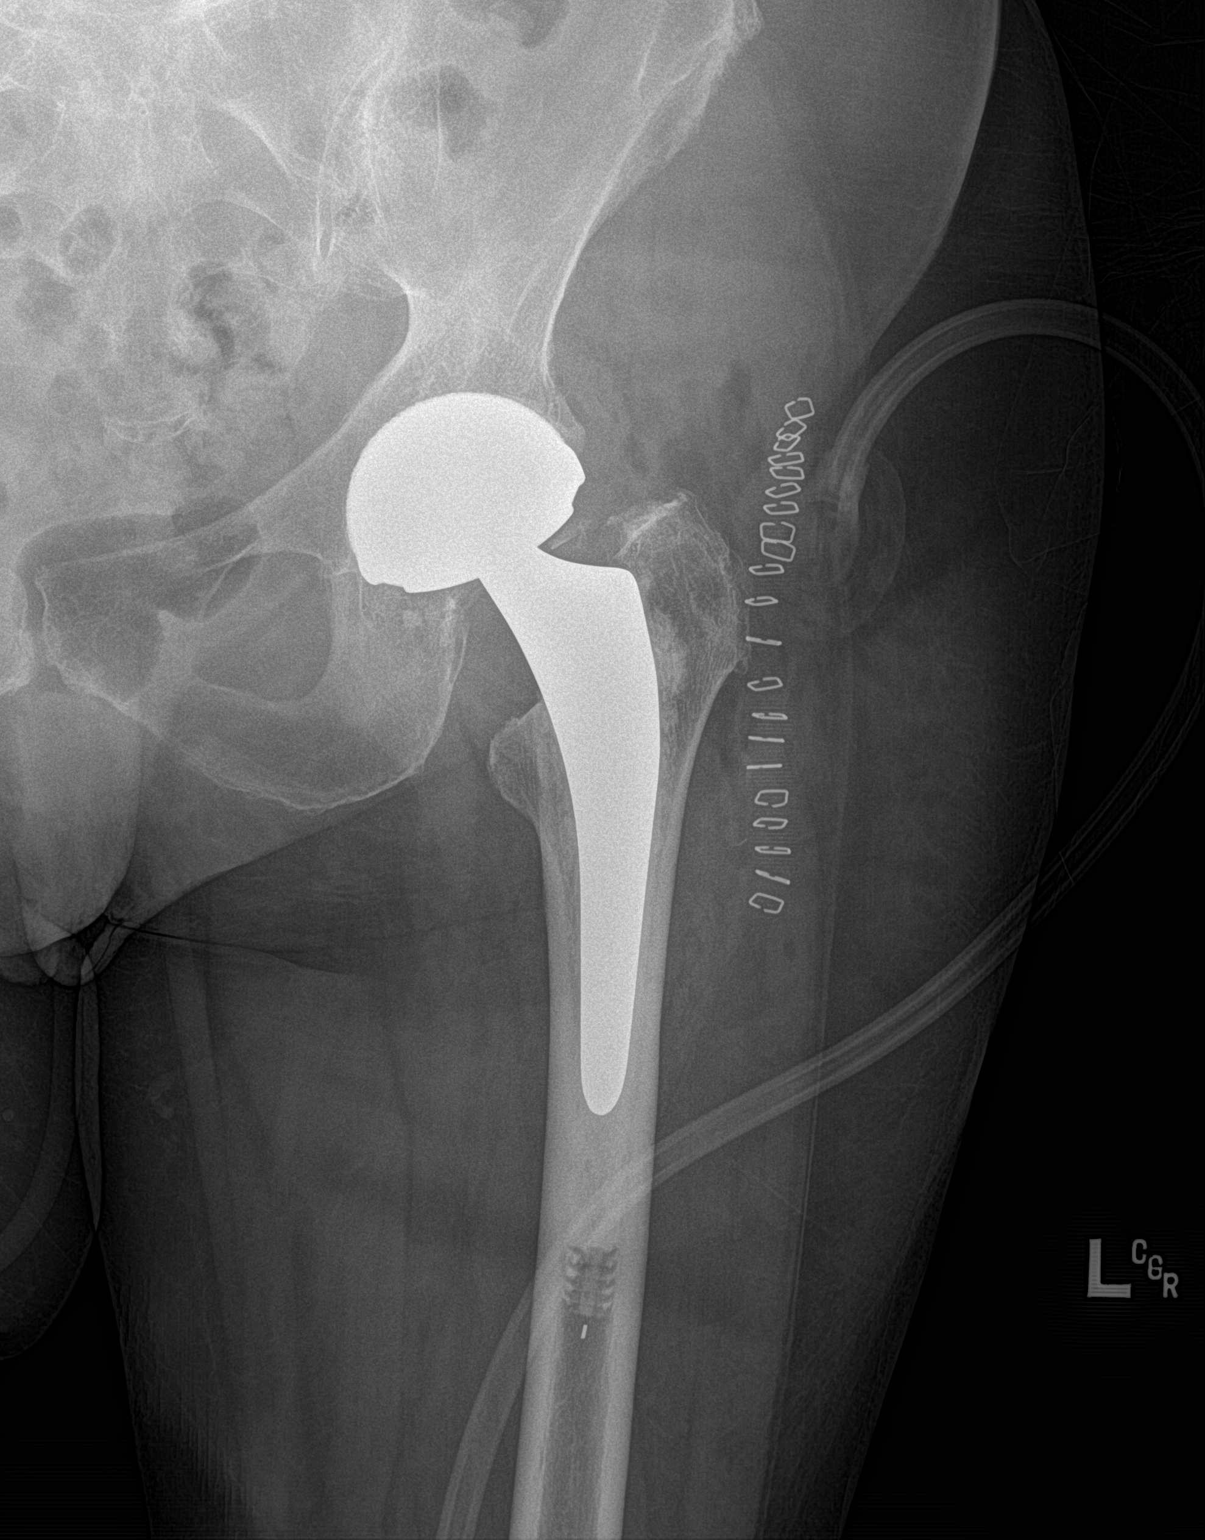

[hip lat]
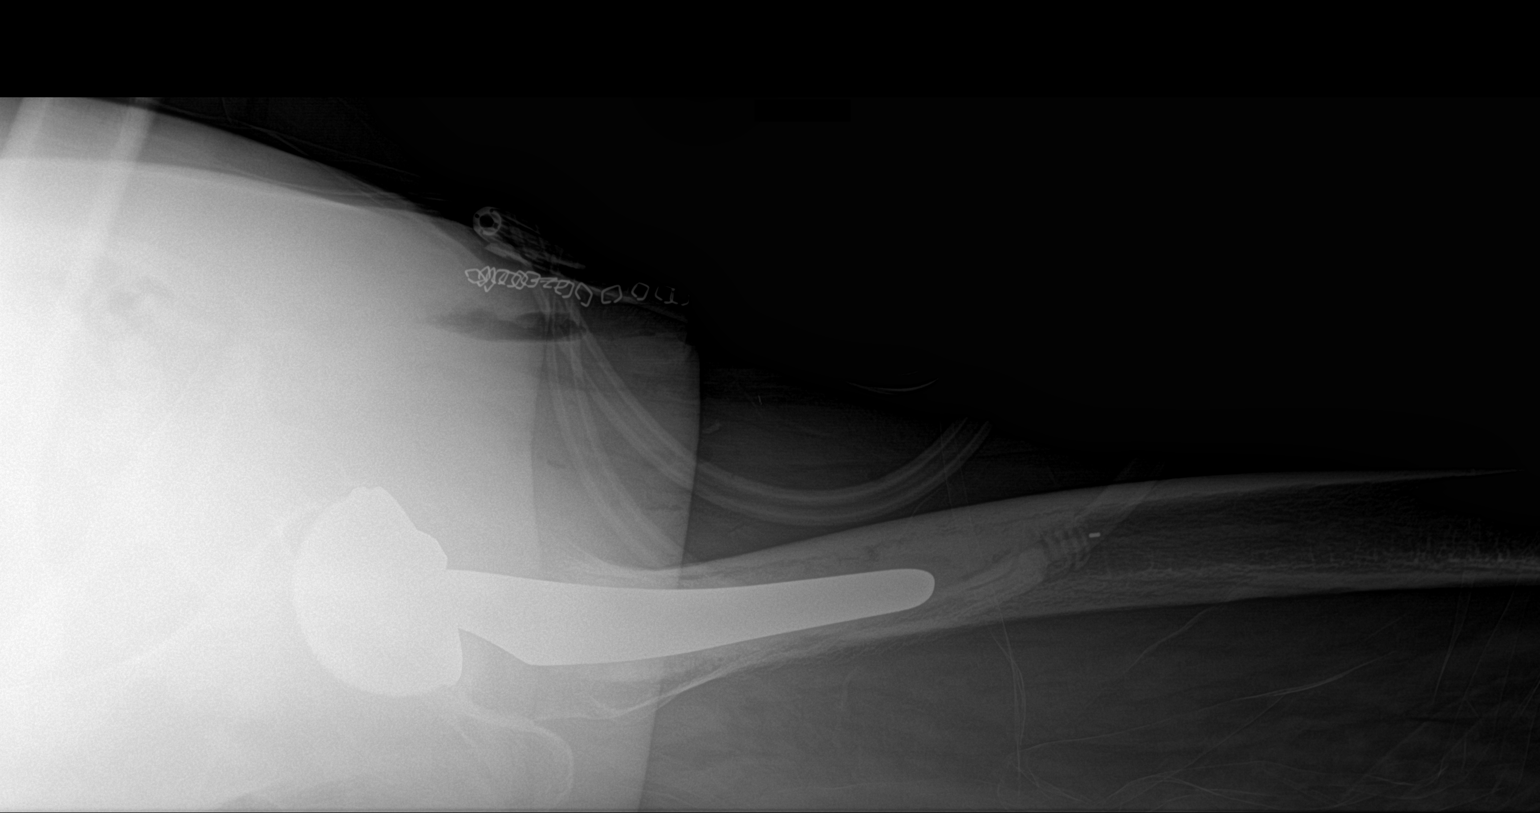

[2 of 2 positions shown; findings below may reference images not displayed]

FINDINGS: Two fluoroscopic spot views are provided. The first image
demonstrates the patient's femoral neck fracture. On the second
image, a left hip arthroplasty is in place. The femoral component is
incompletely visualized. No acute abnormality is seen.
IMPRESSION: Intraoperative imaging for left hip replacement. No acute
abnormality.

## 2022-07-09 IMAGING — XA DG HIP (WITH PELVIS) OPERATIVE*L*
2 series · 2 of 2 positions shown · non-contrast
Comparison: Plain films left hip 01/27/2021.

CLINICAL DATA: Intraoperative imaging for left hip replacement. The
patient suffered a left hip fracture in a fall 01/27/2021.

EXAM:
OPERATIVE LEFT HIP (WITH PELVIS IF PERFORMED) 2 VIEWS
TECHNIQUE: Fluoroscopic spot image(s) were submitted for interpretation
post-operatively.

[Series 3: ortho standard · 1 of 1 slices shown (1 of 2)]
[im 1/1]
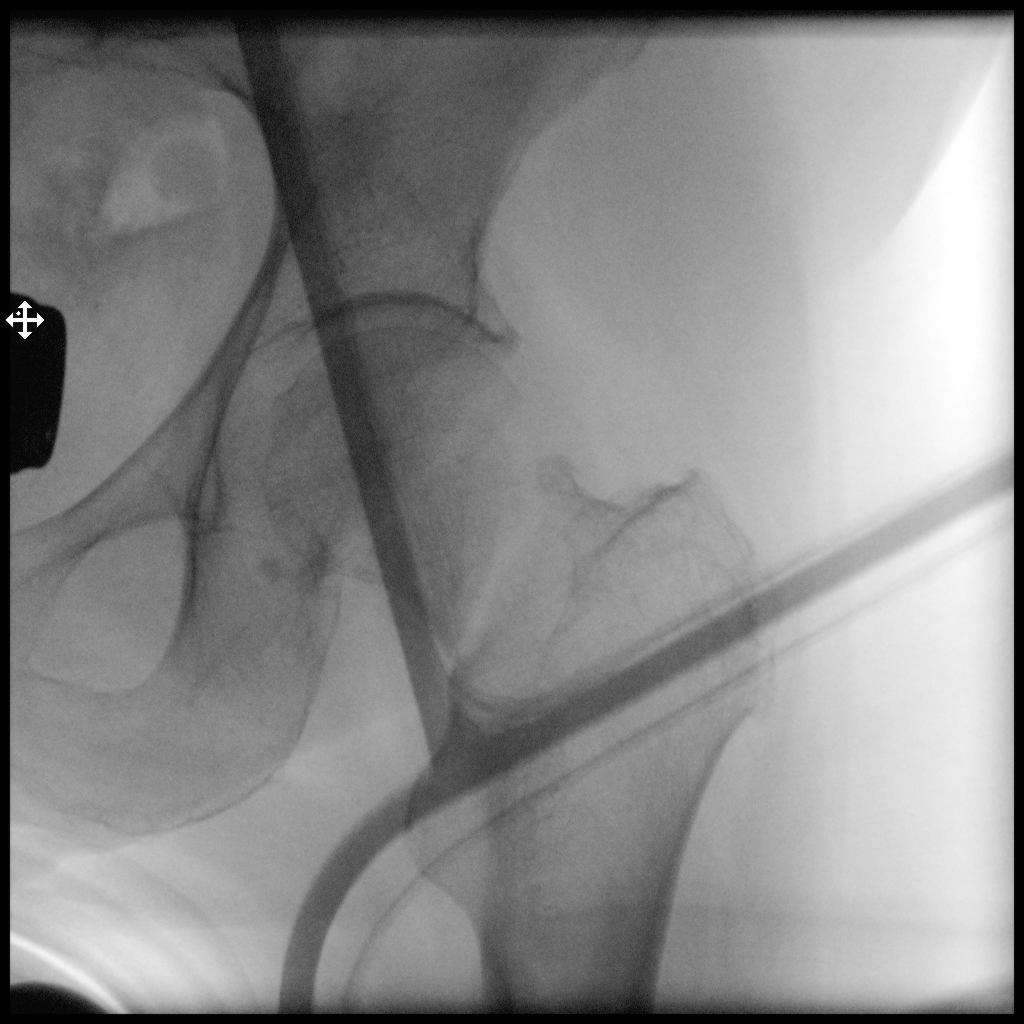

[Series 6: ortho standard · 1 of 1 slices shown (2 of 2)]
[im 1/1]
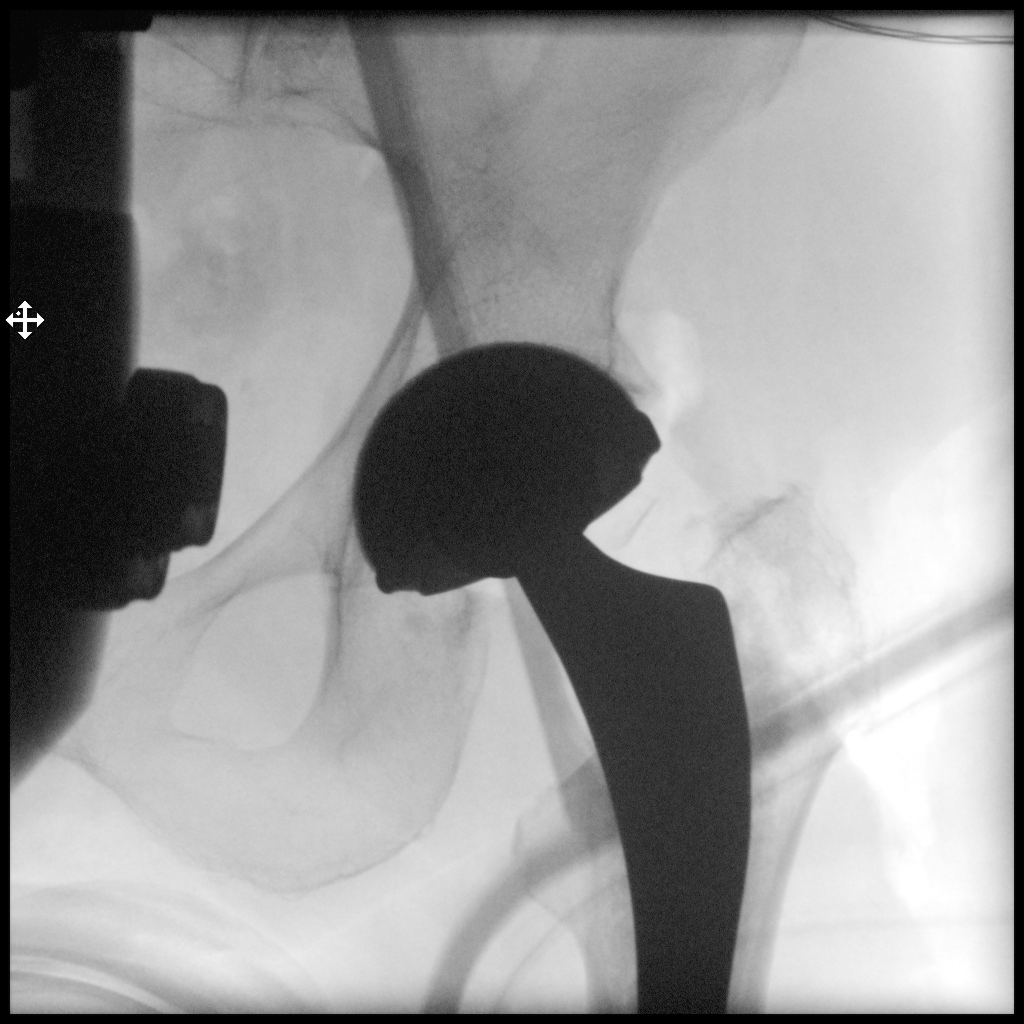

[2 of 2 positions shown; findings below may reference images not displayed]

FINDINGS: Two fluoroscopic spot views are provided. The first image
demonstrates the patient's femoral neck fracture. On the second
image, a left hip arthroplasty is in place. The femoral component is
incompletely visualized. No acute abnormality is seen.
IMPRESSION: Intraoperative imaging for left hip replacement. No acute
abnormality.

## 2022-07-27 ENCOUNTER — Ambulatory Visit
Admission: RE | Admit: 2022-07-27 | Discharge: 2022-07-27 | Disposition: A | Discharge status: Home / Self Care | Payer: Medicare HMO | Source: Ambulatory Visit | Attending: Family Medicine | Admitting: Family Medicine

## 2022-07-27 DIAGNOSIS — Z1231 Encounter for screening mammogram for malignant neoplasm of breast: Secondary | ICD-10-CM | POA: Diagnosis not present

## 2022-08-06 ENCOUNTER — Ambulatory Visit (INDEPENDENT_AMBULATORY_CARE_PROVIDER_SITE_OTHER): Payer: Medicare HMO

## 2022-08-06 DIAGNOSIS — E538 Deficiency of other specified B group vitamins: Secondary | ICD-10-CM

## 2022-08-06 MED ORDER — CYANOCOBALAMIN 1000 MCG/ML IJ SOLN
1000.0000 ug | Freq: Once | INTRAMUSCULAR | Status: AC
  Administered 2022-08-06: 1000 ug via INTRAMUSCULAR

## 2022-08-14 ENCOUNTER — Other Ambulatory Visit: Payer: Self-pay | Admitting: Family Medicine

## 2022-08-14 DIAGNOSIS — K219 Gastro-esophageal reflux disease without esophagitis: Secondary | ICD-10-CM

## 2022-08-14 DIAGNOSIS — F339 Major depressive disorder, recurrent, unspecified: Secondary | ICD-10-CM

## 2022-08-14 DIAGNOSIS — J3489 Other specified disorders of nose and nasal sinuses: Secondary | ICD-10-CM

## 2022-08-20 ENCOUNTER — Ambulatory Visit: Payer: Self-pay

## 2022-08-20 NOTE — Patient Outreach (Signed)
 T DELETE (Optional):[id number]}

## 2022-09-01 NOTE — Progress Notes (Unsigned)
Name: [name]   MRN: [id number]    DOB: 05/27/1941   Date:09/02/2022       Progress Note  Subjective  Chief Complaint  Follow Up  HPI  Depression Major Chronic: she still misses her son , that died at age 82 from [facility] back in 02/08/13.( Her husband died in 02/08/2009  on the same day on October 14 th). She has two grandchildren from him and two other grandchildren from her youngest son. She states she really struggles from October until the end of December. Phq 9  was 10, we tried Celexa 10 mg May 2023  and she states feeling better but still had lack of motivation, we added wellbutrin on her last visit in July and is now able to go to the gym, and feels like it has really helped with motivation, we will adjust dose of celexa and if needed we will go up on dose of wellbutrin next visit   GERD: she was  taking Pepcid and had to clear her throat and GERD was not controlled, since switched to PPI still has to clear her throat but indigestion resolved.   Gait instability: doing better since she had PT and is still doing home exercises , she is still uses her hands to assist when going down a step or stairs.   Senile purpura: she states going on for a long time but stable, reassurance given   Patient Active Problem List   Diagnosis Date Noted   Menopausal osteoporosis 04/08/2022   B12 deficiency 04/08/2022   History of total left hip replacement 04/08/2022   Gait instability 04/08/2022   Impaired fasting glucose    Overweight (BMI 25.0-29.9) 04/03/2020   S/P right TKA 04/02/2020   Status post total knee replacement, right 04/02/2020   Presence of right artificial knee joint 04/02/2020   Senile purpura ([place]) 12/30/2018   S/P vaginal hysterectomy 05/30/2018   Major depression, recurrent, chronic ([place]) 03/02/2018   Gastroesophageal reflux disease without esophagitis 10/20/2016   Anxiety, generalized 09-Feb-2016   Primary osteoarthritis of right knee 12/25/2015   Abnormal brain CT  06/20/2015   Dyslipidemia 06/20/2015   Vertigo 06/20/2015   History of hypertension 06/20/2015   Arthralgia of shoulder 06/20/2015   Glaucoma 06/20/2015   Post-menopausal 06/04/2015    Past Surgical History:  Procedure Laterality Date   ABDOMINAL HYSTERECTOMY     ANAL FISSURE REPAIR  1970s   for bleeding in 1970s   ANTERIOR AND POSTERIOR REPAIR WITH SACROSPINOUS FIXATION N/A 05/30/2018   Procedure: ANTERIOR AND POSTERIOR REPAIR;  Surgeon: Louretta Shorten, MD;  Location: [facility];  Service: Gynecology;  Laterality: N/A;   bilateral cataracts removed Bilateral    COLONOSCOPY  09-Feb-2011   [facility]   COLONOSCOPY WITH PROPOFOL N/A 09/01/2016   Procedure: COLONOSCOPY WITH PROPOFOL;  Surgeon: Robert Bellow, MD;  Location: [facility];  Service: Endoscopy;  Laterality: N/A;   COLONOSCOPY WITH PROPOFOL N/A 09/10/2021   Procedure: COLONOSCOPY WITH PROPOFOL;  Surgeon: Robert Bellow, MD;  Location: [facility];  Service: Endoscopy;  Laterality: N/A;   EYE SURGERY Bilateral    cataracts removed   KNEE SURGERY Left    SALPINGOOPHORECTOMY Right 05/30/2018   Procedure: SALPINGO OOPHORECTOMY;  Surgeon: Louretta Shorten, MD;  Location: [facility];  Service: Gynecology;  Laterality: Right;   SUBCLAVIAN ANGIOGRAM     TOTAL HIP ARTHROPLASTY Left 01/28/2021   Procedure: TOTAL HIP ARTHROPLASTY ANTERIOR APPROACH;  Surgeon: Hessie Knows, MD;  Location: [facility];  Service: Orthopedics;  Laterality: Left;   TOTAL KNEE ARTHROPLASTY Right 04/02/2020   Procedure: TOTAL KNEE ARTHROPLASTY;  Surgeon: Paralee Cancel, MD;  Location: [facility];  Service: Orthopedics;  Laterality: Right;  70 mins   UPPER GI ENDOSCOPY  2012   VAGINAL HYSTERECTOMY N/A 05/30/2018   Procedure: HYSTERECTOMY VAGINAL;  Surgeon: Louretta Shorten, MD;  Location: [facility];  Service: Gynecology;  Laterality: N/A;   WISDOM TOOTH EXTRACTION      Family History  Problem Relation Age of Onset   Cancer Mother 74       colon   Alzheimer's disease Mother     Hypertension Mother    Prostate cancer Father    Hypertension Father    Cerebral aneurysm Father    Diabetes Sister    Depression Sister    Hypertension Sister    Depression Sister    Depression Brother    Cancer Brother        prostate   Cancer Brother    Gallbladder disease Brother    Prostate cancer Brother    Lymphoma Son    Cancer Son 22       lymphoma   Breast cancer Neg Hx     Social History   Tobacco Use   Smoking status: Never   Smokeless tobacco: Never   Tobacco comments:    smoking cessation materials not required  Substance Use Topics   Alcohol use: Yes    Alcohol/week: 0.0 standard drinks of alcohol    Comment: occasional beer/mixed drink     Current Outpatient Medications:    acetaminophen (TYLENOL) 500 MG tablet, Take 500 mg by mouth every 6 (six) hours as needed., Disp: , Rfl:    ALPHAGAN P 0.1 % SOLN, Place 1 drop into both eyes 3 (three) times daily. , Disp: , Rfl:    bimatoprost (LUMIGAN) 0.01 % SOLN, Place 1 drop into both eyes at bedtime., Disp: , Rfl:    buPROPion (WELLBUTRIN XL) 150 MG 24 hr tablet, Take 1 tablet (150 mg total) by mouth in the morning., Disp: 90 tablet, Rfl: 0   citalopram (CELEXA) 10 MG tablet, Take 1 tablet (10 mg total) by mouth daily., Disp: 90 tablet, Rfl: 0   fluticasone (FLONASE) 50 MCG/ACT nasal spray, SPRAY 2 SPRAYS INTO EACH NOSTRIL EVERY DAY, Disp: 48 mL, Rfl: 0   omeprazole (PRILOSEC) 20 MG capsule, Take 1 capsule (20 mg total) by mouth daily., Disp: 90 capsule, Rfl: 0   oxybutynin (DITROPAN-XL) 5 MG 24 hr tablet, Take 5 mg by mouth daily., Disp: , Rfl:    Polyethyl Glycol-Propyl Glycol 0.4-0.3 % SOLN, Apply to eye. Using Refresh Mega-3, Disp: , Rfl:    pravastatin (PRAVACHOL) 20 MG tablet, Take 1 tablet (20 mg total) by mouth daily., Disp: 90 tablet, Rfl: 1   PREMARIN vaginal cream, Place 1 application vaginally 2 (two) times a week., Disp: , Rfl: 12  Allergies  Allergen Reactions   Biphosphate    Codeine      headaches   Combigan [Brimonidine Tartrate-Timolol]     drowsy   Sulfa Antibiotics Nausea And Vomiting and Nausea Only   Metronidazole Itching and Rash    I personally reviewed active problem list, medication list, allergies, family history, social history, health maintenance with the patient/caregiver today.   ROS  Constitutional: Negative for fever or weight change.  Respiratory: Negative for cough and shortness of breath.   Cardiovascular: Negative for chest pain or palpitations.  Gastrointestinal: Negative for abdominal pain, no bowel changes.  Musculoskeletal: Negative for gait problem or joint swelling.  Skin: Negative for rash.  Neurological: Negative for dizziness or headache.  No other specific complaints in a complete review of systems (except as listed in HPI above).   Objective  Vitals:   09/02/22 1451  BP: 122/68  Pulse: 95  Resp: 16  SpO2: 96%  Weight: 144 lb (65.3 kg)  Height: '5\' 2"'$  (1.575 m)    Body mass index is 26.34 kg/m.  Physical Exam  Constitutional: Patient appears well-developed and well-nourished.  No distress.  HEENT: head atraumatic, normocephalic, pupils equal and reactive to light, neck supple Cardiovascular: Normal rate, regular rhythm and normal heart sounds.  No murmur heard. No BLE edema. Pulmonary/Chest: Effort normal and breath sounds normal. No respiratory distress. Abdominal: Soft.  There is no tenderness. Psychiatric: Patient has a normal mood and affect. behavior is normal. Judgment and thought content normal.  Skin: senile purpura right arm    PHQ2/9:    09/02/2022    2:50 PM 06/04/2022   10:00 AM 06/01/2022    2:03 PM 04/08/2022    1:47 PM 03/30/2022   11:39 AM  Depression screen PHQ 2/9  Decreased Interest 0 '1 3 1 '$ 0  Down, Depressed, Hopeless 1 1 0 3 0  PHQ - 2 Score '1 2 3 4 '$ 0  Altered sleeping 0 0 0 3   Tired, decreased energy '1 1 3 3   '$ Change in appetite 0 0 0 0   Feeling bad or failure about yourself  0 0 0 0    Trouble concentrating 0 0 0 0   Moving slowly or fidgety/restless 0 0 0 0   Suicidal thoughts 0 0 0 0   PHQ-9 Score '2 3 6 10     '$ phq 9 is positive   Fall Risk:    09/02/2022    2:50 PM 06/04/2022    9:54 AM 06/01/2022    1:57 PM 04/08/2022    1:47 PM 03/30/2022   11:39 AM  Fall Risk   Falls in the past year? 0 0 0 0 0  Number falls in past yr: 0 0 0 0 0  Injury with Fall? 0 0 0 0 0  Risk for fall due to : No Fall Risks No Fall Risks No Fall Risks No Fall Risks No Fall Risks  Follow up Falls prevention discussed Falls prevention discussed Falls prevention discussed Falls prevention discussed Falls prevention discussed      Functional Status Survey: Is the patient deaf or have difficulty hearing?: Yes Does the patient have difficulty seeing, even when wearing glasses/contacts?: No Does the patient have difficulty concentrating, remembering, or making decisions?: No Does the patient have difficulty walking or climbing stairs?: No Does the patient have difficulty dressing or bathing?: No Does the patient have difficulty doing errands alone such as visiting a doctor's office or shopping?: No    Assessment & Plan  1. Major depression, recurrent, chronic (H[facility])  - buPROPion (WELLBUTRIN XL) 150 MG 24 hr tablet; Take 1 tablet (150 mg total) by mouth in the morning.  Dispense: 90 tablet; Refill: 0 - citalopram (CELEXA) 20 MG tablet; Take 1 tablet (20 mg total) by mouth daily.  Dispense: 90 tablet; Refill: 0  Improving clinically, adjusting dose of celexa today , return in 3 months   2. Dyslipidemia  - pravastatin (PRAVACHOL) 20 MG tablet; Take 1 tablet (20 mg total) by mouth daily.  Dispense: 90 tablet; Refill: 1  3. GERD without esophagitis  -  omeprazole (PRILOSEC) 20 MG capsule; Take 1 capsule (20 mg total) by mouth daily.  Dispense: 90 capsule; Refill: 1  4. Senile purpura ([place])  Reassurance given

## 2022-09-02 ENCOUNTER — Ambulatory Visit (INDEPENDENT_AMBULATORY_CARE_PROVIDER_SITE_OTHER): Payer: Medicare HMO | Admitting: Family Medicine

## 2022-09-02 ENCOUNTER — Encounter: Payer: Self-pay | Admitting: Family Medicine

## 2022-09-02 VITALS — BP 122/68 | HR 95 | Resp 16 | Ht 62.0 in | Wt 144.0 lb

## 2022-09-02 DIAGNOSIS — K219 Gastro-esophageal reflux disease without esophagitis: Secondary | ICD-10-CM | POA: Diagnosis not present

## 2022-09-02 DIAGNOSIS — E785 Hyperlipidemia, unspecified: Secondary | ICD-10-CM | POA: Diagnosis not present

## 2022-09-02 DIAGNOSIS — R69 Illness, unspecified: Secondary | ICD-10-CM | POA: Diagnosis not present

## 2022-09-02 DIAGNOSIS — E538 Deficiency of other specified B group vitamins: Secondary | ICD-10-CM | POA: Diagnosis not present

## 2022-09-02 DIAGNOSIS — F339 Major depressive disorder, recurrent, unspecified: Secondary | ICD-10-CM | POA: Diagnosis not present

## 2022-09-02 DIAGNOSIS — D692 Other nonthrombocytopenic purpura: Secondary | ICD-10-CM | POA: Diagnosis not present

## 2022-09-02 MED ORDER — CITALOPRAM HYDROBROMIDE 20 MG PO TABS: 20.0000 mg | ORAL_TABLET | Freq: Every day | ORAL | 0 refills | Status: DC

## 2022-09-02 MED ORDER — BUPROPION HCL ER (XL) 150 MG PO TB24: 150.0000 mg | ORAL_TABLET | Freq: Every morning | ORAL | 0 refills | Status: DC

## 2022-09-02 MED ORDER — PRAVASTATIN SODIUM 20 MG PO TABS: 20.0000 mg | ORAL_TABLET | Freq: Every day | ORAL | 1 refills | Status: DC

## 2022-09-02 MED ORDER — CYANOCOBALAMIN 1000 MCG/ML IJ SOLN
1000.0000 ug | Freq: Once | INTRAMUSCULAR | Status: AC
  Administered 2022-09-02: 1000 ug via INTRAMUSCULAR

## 2022-09-02 MED ORDER — OMEPRAZOLE 20 MG PO CPDR: 20.0000 mg | DELAYED_RELEASE_CAPSULE | Freq: Every day | ORAL | 1 refills | Status: DC

## 2022-09-02 NOTE — Patient Instructions (Addendum)
You need Tdap and RSV

## 2022-09-03 ENCOUNTER — Other Ambulatory Visit: Payer: Self-pay | Admitting: Family Medicine

## 2022-09-03 DIAGNOSIS — J3489 Other specified disorders of nose and nasal sinuses: Secondary | ICD-10-CM

## 2022-09-04 NOTE — Telephone Encounter (Signed)
Requested Prescriptions  Pending Prescriptions Disp Refills  . fluticasone (FLONASE) 50 MCG/ACT nasal spray [Pharmacy Med Name: FLUTICASONE PROP 50 MCG SPRAY] 48 mL 1    Sig: SPRAY 2 SPRAYS INTO EACH NOSTRIL EVERY DAY     Ear, Nose, and Throat: Nasal Preparations - Corticosteroids Passed - 09/03/2022  5:51 PM      Passed - Valid encounter within last 12 months    Recent Outpatient Visits          2 days ago Senile purpura [facility])   [facility] Steele Sizer, MD   3 months ago Encounter for [employer] annual wellness exam   [facility] Steele Sizer, MD   3 months ago Major depression, recurrent, chronic [facility])   [facility] Steele Sizer, MD   4 months ago Senile purpura [facility])   [facility] Steele Sizer, MD   5 months ago Acute cough   [facility] Delsa Grana, [place]      Future Appointments            In 3 months Ancil Boozer, Drue Stager, MD [facility], [facility]

## 2022-09-07 ENCOUNTER — Ambulatory Visit: Payer: Medicare HMO

## 2022-09-07 DIAGNOSIS — H401132 Primary open-angle glaucoma, bilateral, moderate stage: Secondary | ICD-10-CM | POA: Diagnosis not present

## 2022-09-17 ENCOUNTER — Ambulatory Visit: Payer: Self-pay

## 2022-09-18 ENCOUNTER — Other Ambulatory Visit: Payer: Self-pay

## 2022-09-18 ENCOUNTER — Ambulatory Visit (INDEPENDENT_AMBULATORY_CARE_PROVIDER_SITE_OTHER): Payer: Medicare HMO | Admitting: Internal Medicine

## 2022-09-18 ENCOUNTER — Encounter: Payer: Self-pay | Admitting: Internal Medicine

## 2022-09-18 VITALS — BP 118/72 | HR 68 | Temp 98.6°F | Resp 18 | Ht 62.0 in | Wt 143.5 lb

## 2022-09-18 DIAGNOSIS — J029 Acute pharyngitis, unspecified: Secondary | ICD-10-CM

## 2022-09-18 DIAGNOSIS — J020 Streptococcal pharyngitis: Secondary | ICD-10-CM | POA: Diagnosis not present

## 2022-09-18 DIAGNOSIS — R051 Acute cough: Secondary | ICD-10-CM

## 2022-09-18 LAB — POCT INFLUENZA A/B
Influenza A, POC: NEGATIVE
Influenza B, POC: NEGATIVE

## 2022-09-18 LAB — POCT RAPID STREP A (OFFICE): Rapid Strep A Screen: POSITIVE — AB

## 2022-09-18 MED ORDER — BENZONATATE 100 MG PO CAPS: 100.0000 mg | ORAL_CAPSULE | Freq: Two times a day (BID) | ORAL | 0 refills | Status: DC | PRN

## 2022-09-18 MED ORDER — PENICILLIN V POTASSIUM 500 MG PO TABS: 500.0000 mg | ORAL_TABLET | Freq: Two times a day (BID) | ORAL | 0 refills | Status: AC

## 2022-09-18 NOTE — Patient Instructions (Signed)
It was great seeing you today!  Plan discussed at today's visit: -Tested for flu, COVID and strep today -Cough medication refilled - can stop Mucinex but do Flonase 2 sprays on each side twice a day  Follow up in: as needed or if symptoms worsen or fail to improve  Take care and let us know if you have any questions or concerns prior to your next visit.  Dr. Rosana Berger  Sore Throat A sore throat is pain, burning, irritation, or scratchiness in the throat. When you have a sore throat, you may feel pain or tenderness in your throat when you swallow or talk. Many things can cause a sore throat, including: An infection. Seasonal allergies. Dryness in the air. Irritants, such as smoke or pollution. Radiation treatment for cancer. Gastroesophageal reflux disease (GERD). A tumor. A sore throat is often the first sign of another sickness. It may happen with other symptoms, such as coughing, sneezing, fever, and swollen neck glands. Most sore throats go away without medical treatment. Follow these instructions at home:     Medicines Take over-the-counter and prescription medicines only as told by your health care provider. Children often get sore throats. Do not give your child aspirin because of the association with Reye's syndrome. Use throat sprays to soothe your throat as told by your health care provider. Managing pain To help with pain, try: Sipping warm liquids, such as broth, herbal tea, or warm water. Eating or drinking cold or frozen liquids, such as frozen ice pops. Gargling with a mixture of salt and water 3-4 times a day or as needed. To make salt water, completely dissolve -1 tsp (3-6 g) of salt in 1 cup (237 mL) of warm water. Sucking on hard candy or throat lozenges. Putting a cool-mist humidifier in your bedroom at night to moisten the air. Sitting in the bathroom with the door closed for 5-10 minutes while you run hot water in the shower. General instructions Do not use  any products that contain nicotine or tobacco. These products include cigarettes, chewing tobacco, and vaping devices, such as e-cigarettes. If you need help quitting, ask your health care provider. Rest as needed. Drink enough fluid to keep your urine pale yellow. Wash your hands often with soap and water for at least 20 seconds. If soap and water are not available, use hand sanitizer. Contact a health care provider if: You have a fever for more than 2-3 days. You have symptoms that last for more than 2-3 days. Your throat does not get better within 7 days. You have a fever and your symptoms suddenly get worse. Get help right away if: You have difficulty breathing. You cannot swallow fluids, soft foods, or your saliva. You have increased swelling in your throat or neck. You have persistent nausea and vomiting. These symptoms may represent a serious problem that is an emergency. Do not wait to see if the symptoms will go away. Get medical help right away. Call your local emergency services (911 in the [place] Do not drive yourself to the hospital. Summary A sore throat is pain, burning, irritation, or scratchiness in the throat. Many things can cause a sore throat. Take over-the-counter medicines only as told by your health care provider. Rest as needed. Drink enough fluid to keep your urine pale yellow. Contact a health care provider if your throat does not get better within 7 days. This information is not intended to replace advice given to you by your health care provider. Make sure you  discuss any questions you have with your health care provider. Document Revised: 01/29/2021 Document Reviewed: 01/29/2021 Elsevier Patient Education  [place].

## 2022-09-18 NOTE — Progress Notes (Signed)
Acute Office Visit  Subjective:     [name], female    DOB: 10-Dec-1940, 81 y.o.   MRN: [id number]  Chief Complaint  Patient presents with   Sinusitis    Drainage, congested, cough for 1 week    HPI Patient is in today for sore throat. Symptoms started 3 days ago.   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  HENT:  Positive for sore throat. Negative for congestion, ear pain and sinus pain.   Respiratory:  Positive for cough and sputum production. Negative for shortness of breath and wheezing.   Gastrointestinal:  Negative for abdominal pain, nausea and vomiting.  Neurological:  Positive for headaches.        Objective:    BP 118/72   Pulse 68   Temp 98.6 F (37 C) (Oral)   Resp 18   Ht '5\' 2"'$  (1.575 m)   Wt 143 lb 8 oz (65.1 kg)   SpO2 94%   BMI 26.25 kg/m  BP Readings from Last 3 Encounters:  09/18/22 118/72  09/02/22 122/68  06/04/22 118/76   Wt Readings from Last 3 Encounters:  09/18/22 143 lb 8 oz (65.1 kg)  09/02/22 144 lb (65.3 kg)  06/04/22 147 lb (66.7 kg)      Physical Exam Constitutional:      Appearance: Normal appearance.  HENT:     Head: Normocephalic and atraumatic.     Right Ear: Tympanic membrane, ear canal and external ear normal.     Left Ear: Tympanic membrane, ear canal and external ear normal.     Nose: Nose normal.      Mouth/Throat:     Mouth: Mucous membranes are moist.     Comments: PND present Eyes:     Conjunctiva/sclera: Conjunctivae normal.  Cardiovascular:     Rate and Rhythm: Normal rate and regular rhythm.  Pulmonary:     Effort: Pulmonary effort is normal.     Breath sounds: Normal breath sounds. No wheezing, rhonchi or rales.  Musculoskeletal:     Right lower leg: No edema.     Left lower leg: No edema.  Lymphadenopathy:     Cervical: No cervical adenopathy.  Skin:    General: Skin is warm and dry.  Neurological:     General: No focal deficit present.     Mental Status: She is alert. Mental status is at baseline.  Psychiatric:        Mood and Affect: Mood normal.        Behavior: Behavior normal.     No results found for any visits on 09/18/22.      Assessment & Plan:   1. Strep throat/Sore throat/Acute cough: Rapid strep test positive, will send for culture to confirm. In the meantime treat with penicillin 500 mg BID for 10 days. Will also send cough suppressant and patient will continue nasal sprays. Test for flu and COVID as well today.   -  benzonatate (TESSALON) 100 MG capsule; Take 1 capsule (100 mg total) by mouth 2 (two) times daily as needed for cough.  Dispense: 30 capsule; Refill: 0 - POCT Influenza A/B - POCT rapid strep A - Novel Coronavirus, NAA ([facility]) - Culture, Group A Strep - penicillin v potassium (VEETID) 500 MG tablet; Take 1 tablet (500 mg total) by mouth 2 (two) times daily for 10 days.  Dispense: 20 tablet; Refill: 0   Return if symptoms worsen or fail to improve.  Teodora Medici, DO

## 2022-09-20 LAB — CULTURE, GROUP A STREP
MICRO NUMBER:: 14142293
SPECIMEN QUALITY:: ADEQUATE

## 2022-09-20 LAB — NOVEL CORONAVIRUS, NAA: SARS-CoV-2, NAA: DETECTED — AB

## 2022-09-21 ENCOUNTER — Telehealth (INDEPENDENT_AMBULATORY_CARE_PROVIDER_SITE_OTHER): Payer: Medicare HMO | Admitting: Internal Medicine

## 2022-09-21 ENCOUNTER — Encounter: Payer: Self-pay | Admitting: Internal Medicine

## 2022-09-21 DIAGNOSIS — U071 COVID-19: Secondary | ICD-10-CM

## 2022-09-21 NOTE — Progress Notes (Signed)
Virtual Visit via Telephone Note  I connected with [name] on 09/21/22 at  8:40 AM EST by telephone and verified that I am speaking with the correct person using two identifiers.  Location: Patient: Home Provider: [facility]    I discussed the limitations, risks, security and privacy concerns of performing an evaluation and management service by telephone and the availability of in person appointments. I also discussed with the patient that there may be a patient responsible charge related to this service. The patient expressed understanding and agreed to proceed.  History of Present Illness:  Patient is following up over the phone after a positive COVID test from 09/18/22 when she was seen in the office for URI symptoms.  At the time her rapid strep test was positive but ultimately throat culture was negative for strep.  Her COVID test did come back positive.  Now she has been having symptoms for about 10 days.  Today she states her symptoms overall have been doing better.  Her main symptom that was bothering her previously was sore throat and cough, cough has much improved with the Tessalon Perles and her sore throat has improved with the penicillin as well. Still coughs somewhat but overall better. Does have a new mild headache today but no shortness of breath, wheezing, sinus pain/pressure or fevers. Overall she feels like she is improving but she will continue to stay home from work this week to rest.  She states she does not need anything else today.    Observations/Objective:  General: well sounding, no acute distress Pulm: Occasional dry cough heard over the phone Neuro: Answers all questions appropriately  Assessment and Plan:  1. COVID-19: Called to discussed possible anti-viral therapy but patient states her symptoms are improving.  Concern swab for throat culture was not a good sample, patient will continue penicillin for full 10-day treatment course.  Continue Tessalon Perles  as needed for cough.  No changes made to treatment plan, patient will stay home for another week to rest.  Patient will monitor symptoms and follow-up if her symptoms worsen or fail to improve.   Follow Up Instructions: As needed    I discussed the assessment and treatment plan with the patient. The patient was provided an opportunity to ask questions and all were answered. The patient agreed with the plan and demonstrated an understanding of the instructions.   The patient was advised to call back or seek an in-person evaluation if the symptoms worsen or if the condition fails to improve as anticipated.  I provided 7 minutes of non-face-to-face time during this encounter.   Teodora Medici, DO

## 2022-09-28 ENCOUNTER — Ambulatory Visit: Payer: Self-pay | Admitting: *Deleted

## 2022-09-28 NOTE — Telephone Encounter (Signed)
Summary: sore throat concern   The patient has previously tested positive for COVID 19 and has been out of work for roughly 10 days  The patient is scheduled to return to work tomorrow 09/29/22  The patient shares that they are continuing to experience a sore throat  The patient would like to speak with a member of clinical staff when possible  Please contact further  ----- Message from [employer] sen       Chief Complaint: sore throat only sx after 10 days of covid. Requesting if she should go back to work. Symptoms: had covid with sx 10 days ago still has sore throat from possible nasal drainage. No fever, no cough. Patient works in school system in cafeteria and would like to know if she should stay out longer. Frequency: 10 days  Pertinent Negatives: Patient denies na  Disposition: '[]'$ ED /'[]'$ Urgent Care (no appt availability in office) / '[]'$ Appointment(In office/virtual)/ '[]'$  [facility]/ '[]'$ Home Care/ '[]'$ Refused Recommended Disposition /'[]'$ [facility]/ '[x]'$  Follow-up with PCP Additional Notes:   Seeking advise from PCP if she should go back to work tomorrow due to still having sore throat after having covid 10 days ago . Please advise. Patient would like call back today       Reason for Disposition  [1] Sore throat is the only symptom AND [2] sore throat present < 48 hours  Answer Assessment - Initial Assessment Questions 1. ONSET: "When did the throat start hurting?" (Hours or days ago)      [employer] 10 days ago dx with covid  2. SEVERITY: "How bad is the sore throat?" (Scale 1-10; mild, moderate or severe)   - MILD (1-3):  Doesn't interfere with eating or normal activities.   - MODERATE (4-7): Interferes with eating some solids and normal activities.   - SEVERE (8-10):  Excruciating pain, interferes with most normal activities.   - SEVERE WITH DYSPHAGIA (10): Can't swallow liquids, drooling.     Sore throat due to possible sinus drainage 3. STREP  EXPOSURE: "Has there been any exposure to strep within the past week?" If Yes, ask: "What type of contact occurred?"      na 4.  VIRAL SYMPTOMS: "Are there any symptoms of a cold, such as a runny nose, cough, hoarse voice or red eyes?"      No  5. FEVER: "Do you have a fever?" If Yes, ask: "What is your temperature, how was it measured, and when did it start?"     no 6. PUS ON THE TONSILS: "Is there pus on the tonsils in the back of your throat?"     na 7. OTHER SYMPTOMS: "Do you have any other symptoms?" (e.g., difficulty breathing, headache, rash)     None has had covid x 10 days  8. PREGNANCY: "Is there any chance you are pregnant?" "When was your last menstrual period?"     na  Protocols used: Sore Throat-A-AH

## 2022-09-28 NOTE — Telephone Encounter (Signed)
The patient has previously tested positive for COVID 19 and has been out of work for roughly 10 days   The patient is scheduled to return to work tomorrow 09/29/22   The patient shares that they are continuing to experience a sore throat   The patient would like to speak with a member of clinical staff when possible   Please contact further

## 2022-09-29 ENCOUNTER — Encounter (INDEPENDENT_AMBULATORY_CARE_PROVIDER_SITE_OTHER): Payer: Medicare HMO | Admitting: Internal Medicine

## 2022-09-29 DIAGNOSIS — E538 Deficiency of other specified B group vitamins: Secondary | ICD-10-CM

## 2022-09-29 MED ORDER — CYANOCOBALAMIN 1000 MCG/ML IJ SOLN
1000.0000 ug | Freq: Once | INTRAMUSCULAR | Status: AC
  Administered 2022-09-29: 1000 ug via INTRAMUSCULAR

## 2022-09-29 NOTE — Progress Notes (Signed)
Patient's COVID symptoms resolved. Patient needs Vitamin B12 injection today which was provided.

## 2022-09-29 NOTE — Progress Notes (Deleted)
   Acute Office Visit  Subjective:     [name], female    DOB: October 02, 1941, 81 y.o.   MRN: [id number]  No chief complaint on file.   HPI Patient is in today for continuation of COVID symptoms.  She was found to be COVID-positive on 09/18/2022.  She had a virtual appointment discussing her symptoms, at the time her symptoms had been improving so she opted out of starting the antiviral medication.  Previously she had had a positive rapid strep test in the office and was given penicillin course for 10 days.  She was also given Tessalon Perles for cough.  Today she states that  URI Compliant:   -Worst symptom: -Fever: {Blank single:19197::"yes","no"} -Cough: {Blank single:19197::"yes","no"} -Shortness of breath: {Blank single:19197::"yes","no"} -Wheezing: {Blank single:19197::"yes","no"} -Chest pain: {Blank single:19197::"yes","no","yes, with cough"} -Chest tightness: {Blank single:19197::"yes","no"} -Chest congestion: {Blank single:19197::"yes","no"} -Nasal congestion: {Blank single:19197::"yes","no"} -Runny nose: {Blank single:19197::"yes","no"} -Post nasal drip: {Blank single:19197::"yes","no"} -Sneezing: {Blank single:19197::"yes","no"} -Sore throat: {Blank single:19197::"yes","no"} -Swollen glands: {Blank single:19197::"yes","no"} -Sinus pressure: {Blank single:19197::"yes","no"} -Headache: {Blank single:19197::"yes","no"} -Face pain: {Blank single:19197::"yes","no"} -Toothache: {Blank single:19197::"yes","no"} -Ear pain: {Blank single:19197::"yes","no"} {Blank single:19197::""right","left", "bilateral"} -Ear pressure: {Blank single:19197::"yes","no"} {Blank single:19197::""right","left", "bilateral"} -Eyes red/itching:{Blank single:19197::"yes","no"} -Eye drainage/crusting: {Blank single:19197::"yes","no"}  -Vomiting: {Blank single:19197::"yes","no"} -Rash: {Blank single:19197::"yes","no"} -Fatigue: {Blank single:19197::"yes","no"} -Sick contacts: {Blank  single:19197::"yes","no"} -Strep contacts: {Blank single:19197::"yes","no"}  -Context: {Blank multiple:19196::"better","worse","stable","fluctuating"} -Recurrent sinusitis: {Blank single:19197::"yes","no"} -Relief with OTC cold/cough medications: {Blank single:19197::"yes","no"}  -Treatments attempted: {Blank multiple:19196::"none","cold/sinus","mucinex","anti-histamine","pseudoephedrine","cough syrup","antibiotics"}    ROS      Objective:    There were no vitals taken for this visit. {Vitals History (Optional):23777}  Physical Exam  No results found for any visits on 09/29/22.      Assessment & Plan:   Problem List Items Addressed This Visit   None   No orders of the defined types were placed in this encounter.   No follow-ups on file.  Teodora Medici, DO

## 2022-09-30 ENCOUNTER — Ambulatory Visit: Payer: Medicare HMO

## 2022-10-27 ENCOUNTER — Ambulatory Visit: Payer: Medicare HMO

## 2022-10-28 ENCOUNTER — Ambulatory Visit (INDEPENDENT_AMBULATORY_CARE_PROVIDER_SITE_OTHER): Payer: Medicare HMO

## 2022-10-28 DIAGNOSIS — E538 Deficiency of other specified B group vitamins: Secondary | ICD-10-CM | POA: Diagnosis not present

## 2022-10-28 MED ORDER — CYANOCOBALAMIN 1000 MCG/ML IJ SOLN
1000.0000 ug | Freq: Once | INTRAMUSCULAR | Status: AC
  Administered 2022-10-28: 1000 ug via INTRAMUSCULAR
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
## 2022-11-05 ENCOUNTER — Encounter: Payer: Self-pay | Admitting: Internal Medicine

## 2022-11-05 ENCOUNTER — Ambulatory Visit (INDEPENDENT_AMBULATORY_CARE_PROVIDER_SITE_OTHER): Payer: Medicare HMO | Admitting: Internal Medicine

## 2022-11-05 VITALS — BP 122/74 | HR 91 | Temp 98.1°F | Resp 16 | Ht 62.0 in | Wt 142.5 lb

## 2022-11-05 DIAGNOSIS — S29019A Strain of muscle and tendon of unspecified wall of thorax, initial encounter: Secondary | ICD-10-CM | POA: Diagnosis not present

## 2022-11-05 MED ORDER — NAPROXEN 500 MG PO TABS: 500.0000 mg | ORAL_TABLET | Freq: Two times a day (BID) | ORAL | 0 refills | Status: AC

## 2022-11-05 MED ORDER — TIZANIDINE HCL 4 MG PO TABS: 4.0000 mg | ORAL_TABLET | Freq: Four times a day (QID) | ORAL | 0 refills | Status: DC | PRN

## 2022-11-05 NOTE — Patient Instructions (Addendum)
It was great seeing you today!  Plan discussed at today's visit: -Take Naproxen 500 mg twice a day with food, no other anti-inflammatories, Tylenol ok -Muscle relaxer to take at night - can make you sleepy -Rest, use moist heat and topical medications like Bengay to help the muscle relax   Follow up in: as needed   Take care and let us know if you have any questions or concerns prior to your next visit.  Dr. Rosana Berger  Thoracic Strain A thoracic strain is an injury to the muscles or tendons that attach to the upper back. Tendons are tissues that connect muscle to bone. This injury is sometimes called a mid-back strain. A strain can be mild or very bad. A mild strain may take only 1-2 weeks to heal. A very bad strain involves torn muscles or tendons, so it may take 6-8 weeks to heal. What are the causes? This condition may be caused by: A fall or a hit to the body. Twisting or stretching the back too far. This may happen when doing activities that require a lot of energy, such as lifting heavy objects. In some cases, the cause may not be known. What increases the risk? This injury is more common in: Athletes. People who are very overweight (obese). What are the signs or symptoms? Pain in the middle back, especially with movement. This is the main symptom. Stiffness or limited range of motion. Sudden muscle tightening (spasms). How is this treated? This condition may be treated with: Resting the injured area. Putting heat and cold on the injured area. Medicines for pain and inflammation, such as NSAIDs. Prescription medicine for pain or to relax the muscles. These may be used for a short time if needed. Physical therapy. This will involve doing exercises to stretch and strengthen the middle back. Follow these instructions at home: Managing pain, stiffness, and swelling     If told, put ice on the injured area. Put ice in a plastic bag. Place a towel between your skin and the  bag. Leave the ice on for 20 minutes, 2-3 times a day. If told, put heat on the affected area. Do this as often as told by your doctor. Use the heat source that your doctor recommends, such as a moist heat pack or a heating pad. Place a towel between your skin and the heat source. Leave the heat on for 20-30 minutes. Remove the heat if your skin turns bright red. This is very important if you are unable to feel pain, heat, or cold. You may have a greater risk of getting burned. Activity Rest and return to your normal activities as told by your doctor. Ask your doctor what activities are safe for you. Do exercises as told by your doctor. Medicines Take over-the-counter and prescription medicines only as told by your doctor. Ask your doctor if the medicine prescribed to you: Requires you to avoid driving or using heavy machinery. Can cause trouble pooping (constipation). You may need to take steps to prevent or treat trouble pooping: Drink enough fluid to keep your pee (urine) pale yellow. Take over-the-counter or prescription medicines. Eat foods that are high in fiber. These include beans, whole grains, and fresh fruits and vegetables. Limit foods that are high in fat and processed sugars. These include fried or sweet foods. Injury prevention To prevent a future mid-back injury: Always warm up before physical activity or sports. Cool down and stretch after being active. Use correct form when playing sports and lifting  heavy objects. Bend your knees before you lift heavy objects. Use good posture when sitting and standing. Stay physically fit and maintain a healthy weight. Do at least 150 minutes of moderate-intensity exercise each week, such as brisk walking or water aerobics. Do strength exercises at least 2 times each week.  General instructions Do not use any products that contain nicotine or tobacco. These products include cigarettes, e-cigarettes, and chewing tobacco. If you need  help quitting, ask your doctor. Keep all follow-up visits as told by your doctor. This is important. Contact a doctor if: Your pain is not helped by medicine. Your pain or stiffness is getting worse. You have pain or stiffness in your neck or lower back. Get help right away if you: Have shortness of breath. Have chest pain. Have weakness or loss of feeling (numbness) in your legs or arms. Cannot control when you pee (urinate). Summary A thoracic strain is an injury to the muscles or tendons that attach to the upper back. If told, put ice or heat on the affected area. Rest and return to your normal activities as told by your doctor. Keep all follow-up visits as told by your doctor. This information is not intended to replace advice given to you by your health care provider. Make sure you discuss any questions you have with your health care provider. Document Revised: 09/03/2021 Document Reviewed: 09/03/2021 Elsevier Patient Education  [place].

## 2022-11-25 ENCOUNTER — Ambulatory Visit: Payer: Medicare HMO

## 2022-11-26 DIAGNOSIS — M810 Age-related osteoporosis without current pathological fracture: Secondary | ICD-10-CM | POA: Diagnosis not present

## 2022-11-27 ENCOUNTER — Ambulatory Visit (INDEPENDENT_AMBULATORY_CARE_PROVIDER_SITE_OTHER): Payer: Medicare HMO

## 2022-11-27 DIAGNOSIS — E538 Deficiency of other specified B group vitamins: Secondary | ICD-10-CM

## 2022-11-27 MED ORDER — CYANOCOBALAMIN 1000 MCG/ML IJ SOLN
1000.0000 ug | Freq: Once | INTRAMUSCULAR | Status: AC
  Administered 2022-11-27: 1000 ug via INTRAMUSCULAR

## 2022-12-01 ENCOUNTER — Other Ambulatory Visit: Payer: Self-pay | Admitting: Family Medicine

## 2022-12-01 DIAGNOSIS — F339 Major depressive disorder, recurrent, unspecified: Secondary | ICD-10-CM

## 2022-12-04 NOTE — Progress Notes (Addendum)
Name: [name]   MRN: [id number]    DOB: [birth year]   Date:12/07/2022       Progress Note  Subjective  Chief Complaint  Follow Up  HPI  Depression Major Chronic: she still misses her son , that died at age 83 from [place] back in 02-06-13.( Her husband died in 02-06-09  on the same day on October 14 th). She has two grandchildren from him and two other grandchildren from her youngest son. She states she really struggles from October until the end of December. Phq 9  was 10, we tried Celexa 10 mg May 2023  and she states feeling better but still had lack of motivation, we added wellbutrin on her last visit in July  2023 and is now able to go to the gym, and feels like it has really helped with motivation, we increased celexa to 20 mg in September due to October to Dec being her worse months. Today offered to go down on citalopram now but she wants to stay on current dose   GERD:she is currently taking Omeprazole and states doing better, she states heartburn is only occasional now. She has noticed that chocking sensation has not resolved, and feels like sometimes the food doesn't go down . We will refer her to GI  Gait instability: doing better since she had PT and is still doing home exercises , she is still uses her hands to assist when going down a step or stairs. Denies recent falls.   Dyslipidemia: LDL was down to 79, she is on Pravastatin   Senile purpura: both arms, reassurance given   Osteoporosis: she is under the care of Dr. Gabriel Carina and will have another Reclast infusion - she has an appointment to go back and also recheck bone density   Back pain: she developed acute onset of low back pain in Dec it radiated to thoracic spine and around her ribs, the pain was intense, she had a visit with Dr. Rosana Berger and was given zanaflex but pain is still present although not as intense. We will check x-rays since she has osteoporosis    Patient Active Problem List   Diagnosis Date Noted    Menopausal osteoporosis 04/08/2022   B12 deficiency 04/08/2022   History of total left hip replacement 04/08/2022   Gait instability 04/08/2022   Impaired fasting glucose    Overweight (BMI 25.0-29.9) 04/03/2020   S/P right TKA 04/02/2020   Status post total knee replacement, right 04/02/2020   Presence of right artificial knee joint 04/02/2020   Senile purpura ([place]) 12/30/2018   S/P vaginal hysterectomy 05/30/2018   Major depression, recurrent, chronic ([place]) 03/02/2018   Gastroesophageal reflux disease without esophagitis 10/20/2016   Anxiety, generalized 01/28/2016   Primary osteoarthritis of right knee 12/25/2015   Abnormal brain CT 06/20/2015   Dyslipidemia 06/20/2015   Vertigo 06/20/2015   History of hypertension 06/20/2015   Arthralgia of shoulder 06/20/2015   Glaucoma 06/20/2015   Post-menopausal 06/04/2015    Past Surgical History:  Procedure Laterality Date   ABDOMINAL HYSTERECTOMY     ANAL FISSURE REPAIR  1970s   for bleeding in 1970s   ANTERIOR AND POSTERIOR REPAIR WITH SACROSPINOUS FIXATION N/A 05/30/2018   Procedure: ANTERIOR AND POSTERIOR REPAIR;  Surgeon: Louretta Shorten, MD;  Location: [facility];  Service: Gynecology;  Laterality: N/A;   bilateral cataracts removed Bilateral    COLONOSCOPY  Feb 07, 2011   [facility]   COLONOSCOPY WITH PROPOFOL N/A 09/01/2016   Procedure: COLONOSCOPY WITH PROPOFOL;  Surgeon: Robert Bellow, MD;  Location: [facility] ENDOSCOPY;  Service: Endoscopy;  Laterality: N/A;   COLONOSCOPY WITH PROPOFOL N/A 09/10/2021   Procedure: COLONOSCOPY WITH PROPOFOL;  Surgeon: Robert Bellow, MD;  Location: [facility] ENDOSCOPY;  Service: Endoscopy;  Laterality: N/A;   EYE SURGERY Bilateral    cataracts removed   KNEE SURGERY Left    SALPINGOOPHORECTOMY Right 05/30/2018   Procedure: SALPINGO OOPHORECTOMY;  Surgeon: Louretta Shorten, MD;  Location: [facility];  Service: Gynecology;  Laterality: Right;   SUBCLAVIAN ANGIOGRAM     TOTAL HIP ARTHROPLASTY Left 01/28/2021    Procedure: TOTAL HIP ARTHROPLASTY ANTERIOR APPROACH;  Surgeon: Hessie Knows, MD;  Location: [facility];  Service: Orthopedics;  Laterality: Left;   TOTAL KNEE ARTHROPLASTY Right 04/02/2020   Procedure: TOTAL KNEE ARTHROPLASTY;  Surgeon: Paralee Cancel, MD;  Location: [facility];  Service: Orthopedics;  Laterality: Right;  70 mins   UPPER GI ENDOSCOPY  2012   VAGINAL HYSTERECTOMY N/A 05/30/2018   Procedure: HYSTERECTOMY VAGINAL;  Surgeon: Louretta Shorten, MD;  Location: [facility];  Service: Gynecology;  Laterality: N/A;   WISDOM TOOTH EXTRACTION      Family History  Problem Relation Age of Onset   Cancer Mother 42       colon   Alzheimer's disease Mother    Hypertension Mother    Prostate cancer Father    Hypertension Father    Cerebral aneurysm Father    Diabetes Sister    Depression Sister    Hypertension Sister    Depression Sister    Depression Brother    Cancer Brother        prostate   Cancer Brother    Gallbladder disease Brother    Prostate cancer Brother    Lymphoma Son    Cancer Son 67       lymphoma   Breast cancer Neg Hx     Social History   Tobacco Use   Smoking status: Never   Smokeless tobacco: Never   Tobacco comments:    smoking cessation materials not required  Substance Use Topics   Alcohol use: Yes    Alcohol/week: 0.0 standard drinks of alcohol    Comment: occasional beer/mixed drink     Current Outpatient Medications:    acetaminophen (TYLENOL) 500 MG tablet, Take 500 mg by mouth every 6 (six) hours as needed., Disp: , Rfl:    ALPHAGAN P 0.1 % SOLN, Place 1 drop into both eyes 3 (three) times daily. , Disp: , Rfl:    bimatoprost (LUMIGAN) 0.01 % SOLN, Place 1 drop into both eyes at bedtime., Disp: , Rfl:    buPROPion (WELLBUTRIN XL) 150 MG 24 hr tablet, Take 1 tablet (150 mg total) by mouth in the morning., Disp: 90 tablet, Rfl: 0   citalopram (CELEXA) 20 MG tablet, Take 1 tablet (20 mg total) by mouth daily., Disp: 90 tablet, Rfl: 0   fluticasone  (FLONASE) 50 MCG/ACT nasal spray, SPRAY 2 SPRAYS INTO EACH NOSTRIL EVERY DAY, Disp: 48 mL, Rfl: 1   omeprazole (PRILOSEC) 20 MG capsule, Take 1 capsule (20 mg total) by mouth daily., Disp: 90 capsule, Rfl: 1   Polyethyl Glycol-Propyl Glycol 0.4-0.3 % SOLN, Apply to eye. Using Refresh Mega-3, Disp: , Rfl:    pravastatin (PRAVACHOL) 20 MG tablet, Take 1 tablet (20 mg total) by mouth daily., Disp: 90 tablet, Rfl: 1   PREMARIN vaginal cream, Place 1 application vaginally 2 (two) times a week., Disp: , Rfl: 12   tiZANidine (ZANAFLEX)  4 MG tablet, Take 1 tablet (4 mg total) by mouth every 6 (six) hours as needed for muscle spasms., Disp: 30 tablet, Rfl: 0   oxybutynin (DITROPAN-XL) 5 MG 24 hr tablet, Take 5 mg by mouth daily. (Patient not taking: Reported on 12/07/2022), Disp: , Rfl:   Allergies  Allergen Reactions   Biphosphate    Codeine     headaches   Combigan [Brimonidine Tartrate-Timolol]     drowsy   Sulfa Antibiotics Nausea And Vomiting and Nausea Only   Metronidazole Itching and Rash    I personally reviewed active problem list, medication list, allergies, family history, social history, health maintenance with the patient/caregiver today.   ROS  Ten systems reviewed and is negative except as mentioned in HPI   Objective  Vitals:   12/07/22 1524  BP: 120/72  Pulse: 81  Resp: 14  Temp: 98.1 F (36.7 C)  TempSrc: Oral  SpO2: 96%  Weight: 139 lb 6.4 oz (63.2 kg)  Height: '5\' 2"'$  (1.575 m)    Body mass index is 25.5 kg/m.  Physical Exam  Constitutional: Patient appears well-developed and well-nourished.  No distress.  HEENT: head atraumatic, normocephalic, pupils equal and reactive to light, neck supple Cardiovascular: Normal rate, regular rhythm and normal heart sounds.  No murmur heard. No BLE edema. Pulmonary/Chest: Effort normal and breath sounds normal. No respiratory distress. Abdominal: Soft.  There is no tenderness. Muscular skeletal: no pain during palpation of  spine, negative straight leg raise  Psychiatric: Patient has a normal mood and affect. behavior is normal. Judgment and thought content normal.   Recent Results (from the past 2160 hour(s))  Novel Coronavirus, NAA ([facility])     Status: Abnormal   Collection Time: 09/18/22 12:00 AM   Specimen: Nasopharyngeal(NP) swabs in vial transport medium   Nasopharynge  Previous  Result Value Ref Range   SARS-CoV-2, NAA Detected (A) Not Detected    Comment: Patients who have a positive COVID-19 test result may now have treatment options. Treatment options are available for patients with mild to moderate symptoms and for hospitalized patients. Visit our website at [email or web address] for resources and information. This nucleic acid amplification test was developed and its performance characteristics determined by [employer]. Nucleic acid amplification tests include RT-PCR and TMA. This test has not been FDA cleared or approved. This test has been authorized by FDA under an Emergency Use Authorization (EUA). This test is only authorized for the duration of time the declaration that circumstances exist justifying the authorization of the emergency use of in vitro diagnostic tests for detection of SARS-CoV-2 virus and/or diagnosis of COVID-19 infection under section 564(b)(1) of the Act, 21 U.S.C. 277OEU-2(P) (1), unless the authorization is terminated or revoked sooner. When diagnostic testing is negativ e, the possibility of a false negative result should be considered in the context of a patient's recent exposures and the presence of clinical signs and symptoms consistent with COVID-19. An individual without symptoms of COVID-19 and who is not shedding SARS-CoV-2 virus would expect to have a negative (not detected) result in this assay.   POCT rapid strep A     Status: Abnormal   Collection Time: 09/18/22  9:04 AM  Result Value Ref Range   Rapid Strep A Screen Positive (A)  Negative  POCT Influenza A/B     Status: None   Collection Time: 09/18/22  9:04 AM  Result Value Ref Range   Influenza A, POC Negative Negative   Influenza B, POC Negative Negative  Culture, Group A Strep     Status: None   Collection Time: 09/18/22  9:28 AM   Specimen: Throat  Result Value Ref Range   MICRO NUMBER: [id number]    SPECIMEN QUALITY: Adequate    SOURCE: THROAT    STATUS: FINAL    RESULT: No group A Streptococcus isolated     PHQ2/9:    12/07/2022    3:25 PM 11/05/2022    8:14 AM 09/21/2022    8:47 AM 09/18/2022    8:12 AM 09/02/2022    2:50 PM  Depression screen PHQ 2/9  Decreased Interest 1 0 0 0 0  Down, Depressed, Hopeless 1 0 0 0 1  PHQ - 2 Score 2 0 0 0 1  Altered sleeping 0 0 0  0  Tired, decreased energy 1 0 0  1  Change in appetite 0 0 0  0  Feeling bad or failure about yourself  0 0 0  0  Trouble concentrating 1 0 0  0  Moving slowly or fidgety/restless 0 0 0  0  Suicidal thoughts 0 0 0  0  PHQ-9 Score 4 0 0  2  Difficult doing work/chores Not difficult at all Not difficult at all Not difficult at all      phq 9 is positive   Fall Risk:    12/07/2022    3:25 PM 11/05/2022    8:14 AM 09/21/2022    8:47 AM 09/18/2022    8:11 AM 09/02/2022    2:50 PM  Fall Risk   Falls in the past year? 0 0 0 0 0  Number falls in past yr:  0 0 0 0  Injury with Fall?  0 0 0 0  Risk for fall due to : No Fall Risks    No Fall Risks  Follow up Falls prevention discussed;Education provided;Falls evaluation completed   Falls evaluation completed Falls prevention discussed      Functional Status Survey: Is the patient deaf or have difficulty hearing?: No Does the patient have difficulty seeing, even when wearing glasses/contacts?: No Does the patient have difficulty concentrating, remembering, or making decisions?: No Does the patient have difficulty walking or climbing stairs?: Yes Does the patient have difficulty dressing or bathing?: No Does the patient have  difficulty doing errands alone such as visiting a doctor's office or shopping?: No    Assessment & Plan  1. Thoracic spine pain  - DG Thoracic Spine x-ray  - if negative for fractures we will send her to PT   2. Oropharyngeal dysphagia  - Ambulatory referral to Gastroenterology  3. Major depression, recurrent, chronic (HCC)  - citalopram (CELEXA) 20 MG tablet; Take 1 tablet (20 mg total) by mouth daily.  Dispense: 90 tablet; Refill: 1 - buPROPion (WELLBUTRIN XL) 150 MG 24 hr tablet; Take 1 tablet (150 mg total) by mouth in the morning.  Dispense: 90 tablet; Refill: 1  4. GERD without esophagitis  - omeprazole (PRILOSEC) 40 MG capsule; Take 1 capsule (40 mg total) by mouth daily.  Dispense: 90 capsule; Refill: 0  5. Dyslipidemia  Recheck labs yearly   6. Senile purpura ([place])  Reassurance given  7. Age-related osteoporosis without current pathological fracture   Seen by Dr. Gabriel Carina due for another Reclast infusion

## 2022-12-07 ENCOUNTER — Encounter: Payer: Self-pay | Admitting: Family Medicine

## 2022-12-07 ENCOUNTER — Ambulatory Visit
Admission: RE | Admit: 2022-12-07 | Discharge: 2022-12-07 | Disposition: A | Discharge status: Home / Self Care | Payer: Medicare HMO | Attending: Family Medicine | Admitting: Family Medicine

## 2022-12-07 ENCOUNTER — Ambulatory Visit
Admission: RE | Admit: 2022-12-07 | Discharge: 2022-12-07 | Disposition: A | Discharge status: Home / Self Care | Payer: Medicare HMO | Source: Ambulatory Visit | Attending: Family Medicine | Admitting: Family Medicine

## 2022-12-07 ENCOUNTER — Ambulatory Visit (INDEPENDENT_AMBULATORY_CARE_PROVIDER_SITE_OTHER): Payer: Medicare HMO | Admitting: Family Medicine

## 2022-12-07 VITALS — BP 120/72 | HR 81 | Temp 98.1°F | Resp 14 | Ht 62.0 in | Wt 139.4 lb

## 2022-12-07 DIAGNOSIS — F339 Major depressive disorder, recurrent, unspecified: Secondary | ICD-10-CM

## 2022-12-07 DIAGNOSIS — M810 Age-related osteoporosis without current pathological fracture: Secondary | ICD-10-CM

## 2022-12-07 DIAGNOSIS — K219 Gastro-esophageal reflux disease without esophagitis: Secondary | ICD-10-CM

## 2022-12-07 DIAGNOSIS — G8929 Other chronic pain: Secondary | ICD-10-CM

## 2022-12-07 DIAGNOSIS — M545 Low back pain, unspecified: Secondary | ICD-10-CM | POA: Diagnosis not present

## 2022-12-07 DIAGNOSIS — M546 Pain in thoracic spine: Secondary | ICD-10-CM | POA: Insufficient documentation

## 2022-12-07 DIAGNOSIS — D692 Other nonthrombocytopenic purpura: Secondary | ICD-10-CM

## 2022-12-07 DIAGNOSIS — E785 Hyperlipidemia, unspecified: Secondary | ICD-10-CM

## 2022-12-07 DIAGNOSIS — R1312 Dysphagia, oropharyngeal phase: Secondary | ICD-10-CM

## 2022-12-07 DIAGNOSIS — R69 Illness, unspecified: Secondary | ICD-10-CM | POA: Diagnosis not present

## 2022-12-07 MED ORDER — OMEPRAZOLE 40 MG PO CPDR: 40.0000 mg | DELAYED_RELEASE_CAPSULE | Freq: Every day | ORAL | 0 refills | Status: DC

## 2022-12-07 MED ORDER — BUPROPION HCL ER (XL) 150 MG PO TB24: 150.0000 mg | ORAL_TABLET | Freq: Every morning | ORAL | 1 refills | Status: DC

## 2022-12-07 MED ORDER — CITALOPRAM HYDROBROMIDE 20 MG PO TABS: 20.0000 mg | ORAL_TABLET | Freq: Every day | ORAL | 1 refills | Status: DC

## 2022-12-07 NOTE — Addendum Note (Signed)
Addended by: Loistine Chance on: 12/07/2022 04:31 PM   Modules accepted: Orders

## 2022-12-09 ENCOUNTER — Telehealth: Payer: Self-pay | Admitting: Family Medicine

## 2022-12-09 NOTE — Telephone Encounter (Signed)
Copied from [place] [id number]. Topic: General - Other >> Dec 09, 2022  4:43 PM Sabas Sous wrote: Reason for CRM: Pt called wanting to discuss her recent imaging

## 2022-12-10 NOTE — Telephone Encounter (Signed)
Patient aware

## 2022-12-18 ENCOUNTER — Other Ambulatory Visit: Payer: Self-pay

## 2022-12-21 ENCOUNTER — Encounter: Payer: Self-pay | Admitting: Gastroenterology

## 2022-12-21 ENCOUNTER — Other Ambulatory Visit: Payer: Self-pay

## 2022-12-21 ENCOUNTER — Ambulatory Visit: Payer: Medicare HMO | Admitting: Gastroenterology

## 2022-12-21 VITALS — BP 150/82 | HR 71 | Temp 98.1°F | Ht 62.0 in | Wt 143.0 lb

## 2022-12-21 DIAGNOSIS — R1319 Other dysphagia: Secondary | ICD-10-CM

## 2022-12-21 NOTE — Progress Notes (Signed)
Cephas Darby, MD [address]  [place]  [place], [place] [address]  Main: [phone]  Fax: [phone]    Gastroenterology Consultation  Referring Provider:     Steele Sizer, MD Primary Care Physician:  Steele Sizer, MD Primary Gastroenterologist:  Dr. Cephas Darby Reason for Consultation: Dysphagia        HPI:   [name] is a 82 y.o. female referred by Dr. Steele Sizer, MD  for consultation & management of dysphagia.  Patient reports that she has been having difficulty swallowing both liquids and solids for more than 6 months.  She has history of reflux for which she was taking omeprazole 20 mg once a day, recently increased to 40 mg once a day by Dr. Ancil Boozer about 2 weeks ago.  She reports that her symptoms are persistent, occur few times a week to sometimes do not occur for more than a week.  Because of her age, she wants to undergo upper endoscopy  NSAIDs: None  Antiplts/Anticoagulants/Anti thrombotics: None  GI Procedures:  colonoscopy in 2022 by Dr. Bary Castilla Descending and sigmoid colon diverticulosis 5 mm polyp in the appendiceal orifice, removed with forceps Normal retroflexion in the rectum DIAGNOSIS:  A. APPENDICEAL ORIFICE; COLD BIOPSY:  - SESSILE SERRATED POLYP.  - NEGATIVE FOR DYSPLASIA AND MALIGNANCY.   Past Medical History:  Diagnosis Date   Anemia    Anxiety    no meds   Arthritis    hands, knees - no meds   Bilateral leg weakness    Complication of anesthesia    Difficult to arouse   Dental bridge present    perm lower front bottom bridge   Depression    no meds   Eyes swollen    and red - was seen by MD for possible allergy to new eye drops   GERD (gastroesophageal reflux disease)    diet controlled - only takes med prn basis   Glaucoma    Hypercholesteremia    Hypertension    Osteoporosis    SVD (spontaneous vaginal delivery)    x 3 - only one currently living   Vertigo     Past Surgical History:   Procedure Laterality Date   ABDOMINAL HYSTERECTOMY     ANAL FISSURE REPAIR  1970s   for bleeding in 1970s   ANTERIOR AND POSTERIOR REPAIR WITH SACROSPINOUS FIXATION N/A 05/30/2018   Procedure: ANTERIOR AND POSTERIOR REPAIR;  Surgeon: Louretta Shorten, MD;  Location: [facility];  Service: Gynecology;  Laterality: N/A;   bilateral cataracts removed Bilateral    COLONOSCOPY  2012   [facility]   COLONOSCOPY WITH PROPOFOL N/A 09/01/2016   Procedure: COLONOSCOPY WITH PROPOFOL;  Surgeon: Robert Bellow, MD;  Location: [facility];  Service: Endoscopy;  Laterality: N/A;   COLONOSCOPY WITH PROPOFOL N/A 09/10/2021   Procedure: COLONOSCOPY WITH PROPOFOL;  Surgeon: Robert Bellow, MD;  Location: [facility];  Service: Endoscopy;  Laterality: N/A;   EYE SURGERY Bilateral    cataracts removed   KNEE SURGERY Left    SALPINGOOPHORECTOMY Right 05/30/2018   Procedure: SALPINGO OOPHORECTOMY;  Surgeon: Louretta Shorten, MD;  Location: [facility];  Service: Gynecology;  Laterality: Right;   SUBCLAVIAN ANGIOGRAM     TOTAL HIP ARTHROPLASTY Left 01/28/2021   Procedure: TOTAL HIP ARTHROPLASTY ANTERIOR APPROACH;  Surgeon: Hessie Knows, MD;  Location: [facility];  Service: Orthopedics;  Laterality: Left;   TOTAL KNEE ARTHROPLASTY Right 04/02/2020   Procedure: TOTAL KNEE ARTHROPLASTY;  Surgeon: Alvan Dame,  Rodman Key, MD;  Location: [facility];  Service: Orthopedics;  Laterality: Right;  70 mins   UPPER GI ENDOSCOPY  2012   VAGINAL HYSTERECTOMY N/A 05/30/2018   Procedure: HYSTERECTOMY VAGINAL;  Surgeon: Louretta Shorten, MD;  Location: [facility];  Service: Gynecology;  Laterality: N/A;   WISDOM TOOTH EXTRACTION       Current Outpatient Medications:    acetaminophen (TYLENOL) 500 MG tablet, Take 500 mg by mouth every 6 (six) hours as needed., Disp: , Rfl:    ALPHAGAN P 0.1 % SOLN, Place 1 drop into both eyes 3 (three) times daily. , Disp: , Rfl:    bimatoprost (LUMIGAN) 0.01 % SOLN, Place 1 drop into both eyes at bedtime., Disp: , Rfl:     buPROPion (WELLBUTRIN XL) 150 MG 24 hr tablet, Take 1 tablet (150 mg total) by mouth in the morning., Disp: 90 tablet, Rfl: 1   citalopram (CELEXA) 20 MG tablet, Take 1 tablet (20 mg total) by mouth daily., Disp: 90 tablet, Rfl: 1   fluticasone (FLONASE) 50 MCG/ACT nasal spray, SPRAY 2 SPRAYS INTO EACH NOSTRIL EVERY DAY, Disp: 48 mL, Rfl: 1   latanoprost (XALATAN) 0.005 % ophthalmic solution, 1 drop., Disp: , Rfl:    omeprazole (PRILOSEC) 40 MG capsule, Take 1 capsule (40 mg total) by mouth daily., Disp: 90 capsule, Rfl: 0   oxybutynin (DITROPAN-XL) 5 MG 24 hr tablet, Take 5 mg by mouth daily., Disp: , Rfl:    Polyethyl Glycol-Propyl Glycol 0.4-0.3 % SOLN, Apply to eye. Using Refresh Mega-3, Disp: , Rfl:    pravastatin (PRAVACHOL) 20 MG tablet, Take 1 tablet (20 mg total) by mouth daily., Disp: 90 tablet, Rfl: 1   PREMARIN vaginal cream, Place 1 application vaginally 2 (two) times a week., Disp: , Rfl: 12   tiZANidine (ZANAFLEX) 4 MG tablet, Take 1 tablet (4 mg total) by mouth every 6 (six) hours as needed for muscle spasms., Disp: 30 tablet, Rfl: 0   Family History  Problem Relation Age of Onset   Cancer Mother 67       colon   Alzheimer's disease Mother    Hypertension Mother    Prostate cancer Father    Hypertension Father    Cerebral aneurysm Father    Diabetes Sister    Depression Sister    Hypertension Sister    Depression Sister    Depression Brother    Cancer Brother        prostate   Cancer Brother    Gallbladder disease Brother    Prostate cancer Brother    Lymphoma Son    Cancer Son 35       lymphoma   Breast cancer Neg Hx      Social History   Tobacco Use   Smoking status: Never   Smokeless tobacco: Never   Tobacco comments:    smoking cessation materials not required  Vaping Use   Vaping Use: Never used  Substance Use Topics   Alcohol use: Yes    Alcohol/week: 0.0 standard drinks of alcohol    Comment: occasional beer/mixed drink   Drug use: No     Allergies as of 12/21/2022 - Review Complete 12/21/2022  Allergen Reaction Noted   Biphosphate  02/20/2021   Codeine  06/04/2015   Combigan [brimonidine tartrate-timolol]  03/01/2017   Sulfa antibiotics Nausea And Vomiting and Nausea Only 06/04/2015   Metronidazole Itching and Rash 01/01/2020    Review of Systems:    All systems reviewed and negative except  where noted in HPI.   Physical Exam:  BP (!) 150/82 (BP Location: Left Arm, Patient Position: Sitting, Cuff Size: Normal)   Pulse 71   Temp 98.1 F (36.7 C) (Oral)   Ht '5\' 2"'$  (1.575 m)   Wt 143 lb (64.9 kg)   BMI 26.16 kg/m  No LMP recorded. Patient has had a hysterectomy.  General:   Alert,  Well-developed, well-nourished, pleasant and cooperative in NAD Head:  Normocephalic and atraumatic. Eyes:  Sclera clear, no icterus.   Conjunctiva pink. Ears:  Normal auditory acuity. Nose:  No deformity, discharge, or lesions. Mouth:  No deformity or lesions,oropharynx pink & moist. Neck:  Supple; no masses or thyromegaly. Lungs:  Respirations even and unlabored.  Clear throughout to auscultation.   No wheezes, crackles, or rhonchi. No acute distress. Heart:  Regular rate and rhythm; no murmurs, clicks, rubs, or gallops. Abdomen:  Normal bowel sounds. Soft, non-tender and non-distended without masses, hepatosplenomegaly or hernias noted.  No guarding or rebound tenderness.   Rectal: Not performed Msk:  Symmetrical without gross deformities. Good, equal movement & strength bilaterally. Pulses:  Normal pulses noted. Extremities:  No clubbing or edema.  No cyanosis. Neurologic:  Alert and oriented x3;  grossly normal neurologically. Skin:  Intact without significant lesions or rashes. No jaundice. Psych:  Alert and cooperative. Normal mood and affect.  Imaging Studies: No abdominal imaging  Assessment and Plan:   [name] is a 82 y.o. female with history of chronic GERD, is seen in consultation for 6 months history  of dysphagia to solids and liquids, currently maintained on omeprazole 40 mg daily Discussed about upper endoscopy for further evaluation, if EGD is unremarkable, will take distal and proximal esophageal biopsies and if negative, recommend esophageal manometry  Follow up based on the above workup   Cephas Darby, MD

## 2022-12-23 ENCOUNTER — Ambulatory Visit: Payer: Medicare HMO

## 2022-12-23 DIAGNOSIS — M810 Age-related osteoporosis without current pathological fracture: Secondary | ICD-10-CM | POA: Diagnosis not present

## 2022-12-25 ENCOUNTER — Ambulatory Visit: Payer: Medicare HMO

## 2022-12-28 ENCOUNTER — Ambulatory Visit (INDEPENDENT_AMBULATORY_CARE_PROVIDER_SITE_OTHER): Payer: Medicare HMO

## 2022-12-28 DIAGNOSIS — E538 Deficiency of other specified B group vitamins: Secondary | ICD-10-CM | POA: Diagnosis not present

## 2022-12-28 MED ORDER — CYANOCOBALAMIN 1000 MCG/ML IJ SOLN
1000.0000 ug | Freq: Once | INTRAMUSCULAR | Status: AC
  Administered 2022-12-28: 1000 ug via INTRAMUSCULAR

## 2023-01-04 ENCOUNTER — Telehealth: Payer: Self-pay

## 2023-01-04 NOTE — Telephone Encounter (Signed)
Patient states she has had total knee replacements and before she goes to the dentist she has to take antibiotics. She asked if she needed to do this before her EGD. Informed patient no we do not recommend her to do this before her EGD

## 2023-01-05 ENCOUNTER — Other Ambulatory Visit: Payer: Self-pay

## 2023-01-05 ENCOUNTER — Ambulatory Visit
Admission: RE | Admit: 2023-01-05 | Discharge: 2023-01-05 | Disposition: A | Discharge status: Home / Self Care | Payer: Medicare HMO | Attending: Gastroenterology | Admitting: Gastroenterology

## 2023-01-05 ENCOUNTER — Encounter
Admission: RE | Disposition: A | Discharge status: Home / Self Care | Payer: Self-pay | Source: Home / Self Care | Attending: Gastroenterology

## 2023-01-05 ENCOUNTER — Encounter: Payer: Self-pay | Admitting: Gastroenterology

## 2023-01-05 ENCOUNTER — Ambulatory Visit: Payer: Medicare HMO | Admitting: Certified Registered"

## 2023-01-05 DIAGNOSIS — F419 Anxiety disorder, unspecified: Secondary | ICD-10-CM | POA: Diagnosis not present

## 2023-01-05 DIAGNOSIS — F32A Depression, unspecified: Secondary | ICD-10-CM | POA: Diagnosis not present

## 2023-01-05 DIAGNOSIS — K3189 Other diseases of stomach and duodenum: Secondary | ICD-10-CM | POA: Diagnosis not present

## 2023-01-05 DIAGNOSIS — E785 Hyperlipidemia, unspecified: Secondary | ICD-10-CM | POA: Diagnosis not present

## 2023-01-05 DIAGNOSIS — R1319 Other dysphagia: Secondary | ICD-10-CM | POA: Diagnosis not present

## 2023-01-05 DIAGNOSIS — R1310 Dysphagia, unspecified: Secondary | ICD-10-CM | POA: Insufficient documentation

## 2023-01-05 DIAGNOSIS — I10 Essential (primary) hypertension: Secondary | ICD-10-CM | POA: Diagnosis not present

## 2023-01-05 DIAGNOSIS — K219 Gastro-esophageal reflux disease without esophagitis: Secondary | ICD-10-CM | POA: Insufficient documentation

## 2023-01-05 DIAGNOSIS — K449 Diaphragmatic hernia without obstruction or gangrene: Secondary | ICD-10-CM | POA: Diagnosis not present

## 2023-01-05 DIAGNOSIS — R69 Illness, unspecified: Secondary | ICD-10-CM | POA: Diagnosis not present

## 2023-01-05 HISTORY — PX: ESOPHAGOGASTRODUODENOSCOPY (EGD) WITH PROPOFOL: SHX5813

## 2023-01-05 SURGERY — ESOPHAGOGASTRODUODENOSCOPY (EGD) WITH PROPOFOL
Anesthesia: General

## 2023-01-05 MED ORDER — PROPOFOL 10 MG/ML IV BOLUS
INTRAVENOUS | Status: DC | PRN
  Administered 2023-01-05: 20 mg via INTRAVENOUS
  Administered 2023-01-05: 50 mg via INTRAVENOUS
  Administered 2023-01-05: 10 mg via INTRAVENOUS

## 2023-01-05 MED ORDER — LIDOCAINE HCL (CARDIAC) PF 100 MG/5ML IV SOSY
PREFILLED_SYRINGE | INTRAVENOUS | Status: DC | PRN
  Administered 2023-01-05: 50 mg via INTRAVENOUS

## 2023-01-05 MED ORDER — SODIUM CHLORIDE 0.9 % IV SOLN: INTRAVENOUS | Status: DC

## 2023-01-05 NOTE — Op Note (Signed)
[facility] Gastroenterology Patient Name: [name] Procedure Date: 01/05/2023 9:15 AM MRN: [id number] Account #: [id number] Date of Birth: 22-Sep-1941 Admit Type: Outpatient Age: 82 Room: [facility] ENDO ROOM 3 Gender: Female Note Status: Finalized Instrument Name: Upper Endoscope [id number] Procedure:             Upper GI endoscopy Indications:           Dysphagia, Follow-up of gastro-esophageal reflux                         disease Providers:             Lin Landsman MD, MD Medicines:             General Anesthesia Complications:         No immediate complications. Estimated blood loss: None. Procedure:             Pre-Anesthesia Assessment:                        - Prior to the procedure, a History and Physical was                         performed, and patient medications and allergies were                         reviewed. The patient is competent. The risks and                         benefits of the procedure and the sedation options and                         risks were discussed with the patient. All questions                         were answered and informed consent was obtained.                         Patient identification and proposed procedure were                         verified by the physician, the nurse, the                         anesthesiologist, the anesthetist and the technician                         in the pre-procedure area in the procedure room in the                         endoscopy suite. Mental Status Examination: alert and                         oriented. Airway Examination: normal oropharyngeal                         airway and neck mobility. Respiratory Examination:                         clear to auscultation. CV Examination:  normal.                         Prophylactic Antibiotics: The patient does not require                         prophylactic antibiotics. Prior Anticoagulants: The                          patient has taken no anticoagulant or antiplatelet                         agents. ASA Grade Assessment: III - A patient with                         severe systemic disease. After reviewing the risks and                         benefits, the patient was deemed in satisfactory                         condition to undergo the procedure. The anesthesia                         plan was to use general anesthesia. Immediately prior                         to administration of medications, the patient was                         re-assessed for adequacy to receive sedatives. The                         heart rate, respiratory rate, oxygen saturations,                         blood pressure, adequacy of pulmonary ventilation, and                         response to care were monitored throughout the                         procedure. The physical status of the patient was                         re-assessed after the procedure.                        After obtaining informed consent, the endoscope was                         passed under direct vision. Throughout the procedure,                         the patient's blood pressure, pulse, and oxygen                         saturations were monitored continuously. The Endoscope  was introduced through the mouth, and advanced to the                         second part of duodenum. The upper GI endoscopy was                         accomplished without difficulty. The patient tolerated                         the procedure well. Findings:      The duodenal bulb and second portion of the duodenum were normal.      Diffuse mildly erythematous mucosa without bleeding was found in the       gastric body and in the gastric antrum. Biopsies were taken with a cold       forceps for Helicobacter pylori testing.      The cardia and gastric fundus were normal on retroflexion.      Esophagogastric landmarks were identified: the  gastroesophageal junction       was found at 31 cm from the incisors.      The gastroesophageal junction and examined esophagus were normal.       Biopsies were taken with a cold forceps for histology.      A small hiatal hernia was present. Impression:            - Normal duodenal bulb and second portion of the                         duodenum.                        - Erythematous mucosa in the gastric body and antrum.                         Biopsied.                        - Esophagogastric landmarks identified.                        - Normal gastroesophageal junction and esophagus.                         Biopsied.                        - Small hiatal hernia. Recommendation:        - Await pathology results.                        - Discharge patient to home (with escort).                        - Resume previous diet today.                        - Continue present medications.                        - Follow an antireflux regimen indefinitely. Procedure Code(s):     --- Professional ---  [id number], Esophagogastroduodenoscopy, flexible,                         transoral; with biopsy, single or multiple Diagnosis Code(s):     --- Professional ---                        K31.89, Other diseases of stomach and duodenum                        K44.9, Diaphragmatic hernia without obstruction or                         gangrene                        R13.10, Dysphagia, unspecified                        K21.9, Gastro-esophageal reflux disease without                         esophagitis CPT copyright 2022 American Medical Association. All rights reserved. The codes documented in this report are preliminary and upon coder review may  be revised to meet current compliance requirements. Dr. Ulyess Mort Lin Landsman MD, MD 01/05/2023 9:44:47 AM This report has been signed electronically. Number of Addenda: 0 Note Initiated On: 01/05/2023 9:15 AM Estimated Blood  Loss:  Estimated blood loss: none.      [facility]

## 2023-01-05 NOTE — H&P (Signed)
Cephas Darby, MD [address]  [place]  [place], [place] [address]  Main: [phone]  Fax: [phone] Pager: [phone]  Primary Care Physician:  Steele Sizer, MD Primary Gastroenterologist:  Dr. Cephas Darby  Pre-Procedure History & Physical: HPI:  [name] is a 82 y.o. female is here for an endoscopy.   Past Medical History:  Diagnosis Date   Anemia    Anxiety    no meds   Arthritis    hands, knees - no meds   Bilateral leg weakness    Complication of anesthesia    Difficult to arouse   Dental bridge present    perm lower front bottom bridge   Depression    no meds   Eyes swollen    and red - was seen by MD for possible allergy to new eye drops   GERD (gastroesophageal reflux disease)    diet controlled - only takes med prn basis   Glaucoma    Hypercholesteremia    Hypertension    Osteoporosis    SVD (spontaneous vaginal delivery)    x 3 - only one currently living   Vertigo     Past Surgical History:  Procedure Laterality Date   ABDOMINAL HYSTERECTOMY     ANAL FISSURE REPAIR  1970s   for bleeding in 1970s   ANTERIOR AND POSTERIOR REPAIR WITH SACROSPINOUS FIXATION N/A 05/30/2018   Procedure: ANTERIOR AND POSTERIOR REPAIR;  Surgeon: Louretta Shorten, MD;  Location: [facility];  Service: Gynecology;  Laterality: N/A;   bilateral cataracts removed Bilateral    COLONOSCOPY  2012   [facility]   COLONOSCOPY WITH PROPOFOL N/A 09/01/2016   Procedure: COLONOSCOPY WITH PROPOFOL;  Surgeon: Robert Bellow, MD;  Location: [facility];  Service: Endoscopy;  Laterality: N/A;   COLONOSCOPY WITH PROPOFOL N/A 09/10/2021   Procedure: COLONOSCOPY WITH PROPOFOL;  Surgeon: Robert Bellow, MD;  Location: [facility];  Service: Endoscopy;  Laterality: N/A;   EYE SURGERY Bilateral    cataracts removed   KNEE SURGERY Left    SALPINGOOPHORECTOMY Right 05/30/2018   Procedure: SALPINGO OOPHORECTOMY;  Surgeon: Louretta Shorten, MD;  Location: [facility];   Service: Gynecology;  Laterality: Right;   SUBCLAVIAN ANGIOGRAM     TOTAL HIP ARTHROPLASTY Left 01/28/2021   Procedure: TOTAL HIP ARTHROPLASTY ANTERIOR APPROACH;  Surgeon: Hessie Knows, MD;  Location: [facility];  Service: Orthopedics;  Laterality: Left;   TOTAL KNEE ARTHROPLASTY Right 04/02/2020   Procedure: TOTAL KNEE ARTHROPLASTY;  Surgeon: Paralee Cancel, MD;  Location: [facility];  Service: Orthopedics;  Laterality: Right;  70 mins   UPPER GI ENDOSCOPY  2012   VAGINAL HYSTERECTOMY N/A 05/30/2018   Procedure: HYSTERECTOMY VAGINAL;  Surgeon: Louretta Shorten, MD;  Location: [facility];  Service: Gynecology;  Laterality: N/A;   WISDOM TOOTH EXTRACTION      Prior to Admission medications   Medication Sig Start Date End Date Taking? Authorizing Provider  ALPHAGAN P 0.1 % SOLN Place 1 drop into both eyes 3 (three) times daily.  02/16/17  Yes [provider]  bimatoprost (LUMIGAN) 0.01 % SOLN Place 1 drop into both eyes at bedtime.   Yes [provider]  buPROPion (WELLBUTRIN XL) 150 MG 24 hr tablet Take 1 tablet (150 mg total) by mouth in the morning. 12/07/22  Yes Sowles, Drue Stager, MD  citalopram (CELEXA) 20 MG tablet Take 1 tablet (20 mg total) by mouth daily. 12/07/22  Yes Sowles, Drue Stager, MD  omeprazole (PRILOSEC) 40 MG capsule Take  1 capsule (40 mg total) by mouth daily. 12/07/22  Yes Sowles, Drue Stager, MD  oxybutynin (DITROPAN-XL) 5 MG 24 hr tablet Take 5 mg by mouth daily. 12/03/21  Yes [provider]  pravastatin (PRAVACHOL) 20 MG tablet Take 1 tablet (20 mg total) by mouth daily. 09/02/22  Yes Sowles, Drue Stager, MD  PREMARIN vaginal cream Place 1 application vaginally 2 (two) times a week.   Yes [provider]  acetaminophen (TYLENOL) 500 MG tablet Take 500 mg by mouth every 6 (six) hours as needed.    [provider]  fluticasone (FLONASE) 50 MCG/ACT nasal spray SPRAY 2 SPRAYS INTO EACH NOSTRIL EVERY DAY 09/04/22   Sowles, Drue Stager, MD  latanoprost (XALATAN) 0.005  % ophthalmic solution 1 drop. 12/14/22   [provider]  Polyethyl Glycol-Propyl Glycol 0.4-0.3 % SOLN Apply to eye. Using Refresh Mega-3    [provider]  tiZANidine (ZANAFLEX) 4 MG tablet Take 1 tablet (4 mg total) by mouth every 6 (six) hours as needed for muscle spasms. Patient not taking: Reported on 01/05/2023 11/05/22   Teodora Medici, DO    Allergies as of 12/21/2022 - Review Complete 12/21/2022  Allergen Reaction Noted   Biphosphate  02/20/2021   Codeine  06/04/2015   Combigan [brimonidine tartrate-timolol]  03/01/2017   Sulfa antibiotics Nausea And Vomiting and Nausea Only 06/04/2015   Metronidazole Itching and Rash 01/01/2020    Family History  Problem Relation Age of Onset   Cancer Mother 54       colon   Alzheimer's disease Mother    Hypertension Mother    Prostate cancer Father    Hypertension Father    Cerebral aneurysm Father    Diabetes Sister    Depression Sister    Hypertension Sister    Depression Sister    Depression Brother    Cancer Brother        prostate   Cancer Brother    Gallbladder disease Brother    Prostate cancer Brother    Lymphoma Son    Cancer Son 73       lymphoma   Breast cancer Neg Hx     Social History   Socioeconomic History   Marital status: Widowed    Spouse name: [name]   Number of children: 3   Years of education: Not on file   Highest education level: 12th grade  Occupational History    Employer: [employer]    Comment: retired  Tobacco Use   Smoking status: Never   Smokeless tobacco: Never   Tobacco comments:    smoking cessation materials not required  Vaping Use   Vaping Use: Never used  Substance and Sexual Activity   Alcohol use: Yes    Alcohol/week: 0.0 standard drinks of alcohol    Comment: occasional beer/mixed drink   Drug use: No   Sexual activity: Yes    Partners: Male    Birth control/protection: Post-menopausal  Other Topics Concern   Not on file  Social History Narrative    Pt lives alone. Significant other passed away in 2020-12-10   Social Determinants of Health   Financial Resource Strain: Low Risk  (06/04/2022)   Overall Financial Resource Strain (CARDIA)    Difficulty of Paying Living Expenses: Not very hard  Food Insecurity: No Food Insecurity (06/22/2022)   Hunger Vital Sign    Worried About Running Out of Food in the Last Year: Never true    Ran Out of Food in the Last Year:  Never true  Transportation Needs: No Transportation Needs (06/22/2022)   PRAPARE - Hydrologist (Medical): No    Lack of Transportation (Non-Medical): No  Physical Activity: Insufficiently Active (06/04/2022)   Exercise Vital Sign    Days of Exercise per Week: 2 days    Minutes of Exercise per Session: 30 min  Stress: Stress Concern Present (06/04/2022)   Golf    Feeling of Stress : To some extent  Social Connections: Socially Isolated (06/04/2022)   Social Connection and Isolation Panel [NHANES]    Frequency of Communication with Friends and Family: More than three times a week    Frequency of Social Gatherings with Friends and Family: Twice a week    Attends Religious Services: Never    Marine scientist or Organizations: No    Attends Archivist Meetings: Never    Marital Status: Widowed  Intimate Partner Violence: Not At Risk (06/04/2022)   Humiliation, Afraid, Rape, and Kick questionnaire    Fear of Current or Ex-Partner: No    Emotionally Abused: No    Physically Abused: No    Sexually Abused: No    Review of Systems: See HPI, otherwise negative ROS  Physical Exam: BP (!) 177/78   Pulse 78   Temp 97.7 F (36.5 C) (Temporal)   Resp 19   Ht 5' 2"$  (1.575 m)   Wt 63.5 kg   SpO2 99%   BMI 25.61 kg/m  General:   Alert,  pleasant and cooperative in NAD Head:  Normocephalic and atraumatic. Neck:  Supple; no masses or thyromegaly. Lungs:  Clear  throughout to auscultation.    Heart:  Regular rate and rhythm. Abdomen:  Soft, nontender and nondistended. Normal bowel sounds, without guarding, and without rebound.   Neurologic:  Alert and  oriented x4;  grossly normal neurologically.  Impression/Plan: Chazity Bru Persky is here for an endoscopy to be performed for dysphagia, chronic GERD  Risks, benefits, limitations, and alternatives regarding  endoscopy have been reviewed with the patient.  Questions have been answered.  All parties agreeable.   Sherri Sear, MD  01/05/2023, 9:47 AM

## 2023-01-05 NOTE — Anesthesia Preprocedure Evaluation (Signed)
Anesthesia Evaluation  Patient identified by MRN, date of birth, ID band Patient awake    Reviewed: Allergy & Precautions, NPO status , Patient's Chart, lab work & pertinent test results  History of Anesthesia Complications (+) history of anesthetic complications  Airway Mallampati: II  TM Distance: >3 FB Neck ROM: Full    Dental no notable dental hx. (+) Implants   Pulmonary neg sleep apnea, neg COPD, Not current smoker   Pulmonary exam normal breath sounds clear to auscultation       Cardiovascular hypertension, (-) Past MI and (-) CHF Normal cardiovascular exam(-) dysrhythmias (-) Valvular Problems/Murmurs Rhythm:Regular Rate:Normal - Systolic murmurs Pt reports having been off medication for HTN. States her recent clinic visit, her BP was normal.    Neuro/Psych neg Seizures PSYCHIATRIC DISORDERS Anxiety Depression       GI/Hepatic Neg liver ROS,GERD  Controlled,,  Endo/Other  neg diabetes    Renal/GU negative Renal ROS     Musculoskeletal  (+) Arthritis ,    Abdominal Normal abdominal exam  (+)   Peds  Hematology  (+) Blood dyscrasia, anemia   Anesthesia Other Findings   Reproductive/Obstetrics                              Anesthesia Physical Anesthesia Plan  ASA: 3  Anesthesia Plan: General   Post-op Pain Management: Minimal or no pain anticipated   Induction: Intravenous  PONV Risk Score and Plan: 3 and Treatment may vary due to age or medical condition, Propofol infusion and TIVA  Airway Management Planned: Nasal Cannula and Natural Airway  Additional Equipment: None  Intra-op Plan:   Post-operative Plan:   Informed Consent: I have reviewed the patients History and Physical, chart, labs and discussed the procedure including the risks, benefits and alternatives for the proposed anesthesia with the patient or authorized representative who has indicated his/her  understanding and acceptance.     Dental advisory given  Plan Discussed with: CRNA and Anesthesiologist  Anesthesia Plan Comments: (Discussed risks of anesthesia with patient, including possibility of difficulty with spontaneous ventilation under anesthesia necessitating airway intervention, PONV, and rare risks such as cardiac or respiratory or neurological events, and allergic reactions. Discussed the role of CRNA in patient's perioperative care. Patient understands.)         Anesthesia Quick Evaluation

## 2023-01-05 NOTE — Anesthesia Postprocedure Evaluation (Signed)
Anesthesia Post Note  Patient: [name]  Procedure(s) Performed: ESOPHAGOGASTRODUODENOSCOPY (EGD) WITH PROPOFOL  Patient location during evaluation: Endoscopy Anesthesia Type: General Level of consciousness: awake and alert Pain management: pain level controlled Vital Signs Assessment: post-procedure vital signs reviewed and stable Respiratory status: spontaneous breathing, nonlabored ventilation, respiratory function stable and patient connected to nasal cannula oxygen Cardiovascular status: blood pressure returned to baseline and stable Postop Assessment: no apparent nausea or vomiting Anesthetic complications: no   No notable events documented.   Last Vitals:  Vitals:   01/05/23 1002 01/05/23 1007  BP:  (!) 159/74  Pulse: 74   Resp: (!) 22 (!) 21  Temp:    SpO2: 98%     Last Pain:  Vitals:   01/05/23 0908  TempSrc: Temporal  PainSc: 0-No pain                 Arita Miss

## 2023-01-05 NOTE — Anesthesia Procedure Notes (Signed)
Procedure Name: MAC Date/Time: 01/05/2023 9:34 AM  Performed by: Jerrye Noble, CRNAPre-anesthesia Checklist: Patient identified, Emergency Drugs available, Patient being monitored and Suction available Patient Re-evaluated:Patient Re-evaluated prior to induction Oxygen Delivery Method: Nasal cannula

## 2023-01-05 NOTE — Transfer of Care (Signed)
Immediate Anesthesia Transfer of Care Note  Patient: [name]  Procedure(s) Performed: ESOPHAGOGASTRODUODENOSCOPY (EGD) WITH PROPOFOL  Patient Location: PACU and Endoscopy Unit  Anesthesia Type:General  Level of Consciousness: drowsy and patient cooperative  Airway & Oxygen Therapy: Patient Spontanous Breathing  Post-op Assessment: Report given to RN and Post -op Vital signs reviewed and stable  Post vital signs: Reviewed and stable  Last Vitals:  Vitals Value Taken Time  BP 158/72 01/05/23 0947  Temp    Pulse 77 01/05/23 0948  Resp 19 01/05/23 0948  SpO2 99 % 01/05/23 0948  Vitals shown include unvalidated device data.  Last Pain:  Vitals:   01/05/23 0908  TempSrc: Temporal  PainSc: 0-No pain         Complications: No notable events documented.

## 2023-01-06 ENCOUNTER — Encounter: Payer: Self-pay | Admitting: Gastroenterology

## 2023-01-06 LAB — SURGICAL PATHOLOGY

## 2023-01-11 ENCOUNTER — Telehealth: Payer: Self-pay

## 2023-01-11 DIAGNOSIS — H401132 Primary open-angle glaucoma, bilateral, moderate stage: Secondary | ICD-10-CM | POA: Diagnosis not present

## 2023-01-11 NOTE — Telephone Encounter (Signed)
Patient verbalized understanding of results. She states since she has increased the Pantoprazole she has had no issues. She states she will think about the Manometry test and let us know if she wants it.

## 2023-01-11 NOTE — Telephone Encounter (Signed)
-----   Message from Lin Landsman, MD sent at 01/10/2023  9:25 PM EST ----- Please inform patient that the pathology results from upper endoscopy came back normal.  There was no explanation for difficulty swallowing based on the pathology results.  If patient is agreeable, next step is to evaluate for motility of her esophagus by doing esophageal manometry.  [employer]

## 2023-01-25 ENCOUNTER — Ambulatory Visit (INDEPENDENT_AMBULATORY_CARE_PROVIDER_SITE_OTHER): Payer: Medicare HMO

## 2023-01-25 DIAGNOSIS — E538 Deficiency of other specified B group vitamins: Secondary | ICD-10-CM | POA: Diagnosis not present

## 2023-01-25 MED ORDER — CYANOCOBALAMIN 1000 MCG/ML IJ SOLN
1000.0000 ug | Freq: Once | INTRAMUSCULAR | Status: AC
  Administered 2023-01-25: 1000 ug via INTRAMUSCULAR

## 2023-02-23 ENCOUNTER — Other Ambulatory Visit: Payer: Self-pay | Admitting: Family Medicine

## 2023-02-23 DIAGNOSIS — J3489 Other specified disorders of nose and nasal sinuses: Secondary | ICD-10-CM

## 2023-03-05 DIAGNOSIS — N952 Postmenopausal atrophic vaginitis: Secondary | ICD-10-CM | POA: Diagnosis not present

## 2023-03-05 DIAGNOSIS — Z01419 Encounter for gynecological examination (general) (routine) without abnormal findings: Secondary | ICD-10-CM | POA: Diagnosis not present

## 2023-03-05 DIAGNOSIS — Q794 Prune belly syndrome: Secondary | ICD-10-CM | POA: Diagnosis not present

## 2023-03-05 DIAGNOSIS — Z6826 Body mass index (BMI) 26.0-26.9, adult: Secondary | ICD-10-CM | POA: Diagnosis not present

## 2023-03-08 ENCOUNTER — Ambulatory Visit: Payer: Medicare HMO | Admitting: Family Medicine

## 2023-03-13 ENCOUNTER — Other Ambulatory Visit: Payer: Self-pay | Admitting: Family Medicine

## 2023-03-13 DIAGNOSIS — K219 Gastro-esophageal reflux disease without esophagitis: Secondary | ICD-10-CM

## 2023-03-16 ENCOUNTER — Telehealth: Payer: Self-pay | Admitting: Family Medicine

## 2023-03-16 NOTE — Telephone Encounter (Signed)
Called patient to schedule Medicare Annual Wellness Visit (AWV). Left message for patient to call back and schedule Medicare Annual Wellness Visit (AWV).  Last date of AWV: 06/04/2022  Please schedule an appointment at any time with [facility].  If any questions, please contact me at [phone].  Thank you ,  Randon Goldsmith Care Guide [facility] AWV TEAM Direct Dial: [phone]

## 2023-03-16 NOTE — Telephone Encounter (Signed)
Contacted Correen Bubolz Leisey to schedule their annual wellness visit. Appointment made for 04/15/2023.  [facility] Care Guide [facility] AWV TEAM Direct Dial: [phone]

## 2023-03-23 NOTE — Progress Notes (Signed)
Name: [name]   MRN: [id number]    DOB: 19-Oct-1941   Date:03/24/2023       Progress Note  Subjective  Chief Complaint  Follow Up  HPI  Depression Major Chronic: she still misses her son , that died at age 83 from lymphoma back in 04-09-13.( Her husband died in 2009/04/09  on the same day on October 14 th). She has two grandchildren from him and two other grandchildren from her youngest son. She states she really struggles from October until the end of December. Phq 9  was 10, we tried Celexa 10 mg 04/09/22 and she states feeling better but still had lack of motivation, we added wellbutrin on her last visit in July  2023 and is now able to go to the gym, and feels like it has really helped with motivation, we increased celexa to 20 mg in September due to October to Dec being her worse months. Today offered to go down on citalopram now but she wants to stay on current dose   GERD: she was having dysphagia and choking, but seen by Dr. Allegra Lai and EGD was normal except for mild hiatal hernia, she is taking PPI, does not want to have further testing at this time ( Dr. Allegra Lai advised manometry) , symptoms have improved  Gait instability: doing better since she had PT and is still doing home exercises , she is still uses her hands to assist when going down a step or stairs. No recent falls She has OA on both knees, she had knee replacement on right side but now having more pain on left side and is going back to Ortho   Dyslipidemia: LDL was down to 79, she is on Pravastatin   Senile purpura: both arms, stable and reassurance given   Osteoporosis: she is under the care of Dr. Tedd Sias and will have another Reclast infusion .   Back pain: she developed acute onset of low back pain in Dec it radiated to thoracic spine and around her ribs, the pain was intense, she had a visit with Dr. Caralee Ates and was given zanaflex but pain resolved and is not taking medications anymore   Patient Active Problem List    Diagnosis Date Noted   Esophageal dysphagia 01/05/2023   Hiatal hernia 01/05/2023   Menopausal osteoporosis 04/08/2022   B12 deficiency 04/08/2022   History of total left hip replacement 04/08/2022   Gait instability 04/08/2022   Impaired fasting glucose    Overweight (BMI 25.0-29.9) 04/03/2020   S/P right TKA 04/02/2020   Status post total knee replacement, right 04/02/2020   Presence of right artificial knee joint 04/02/2020   Senile purpura (HCC) 12/30/2018   S/P vaginal hysterectomy 05/30/2018   Major depression, recurrent, chronic (HCC) 03/02/2018   Gastroesophageal reflux disease without esophagitis 10/20/2016   Anxiety, generalized 01/28/2016   Primary osteoarthritis of right knee 12/25/2015   Abnormal brain CT 06/20/2015   Dyslipidemia 06/20/2015   Vertigo 06/20/2015   History of hypertension 06/20/2015   Arthralgia of shoulder 06/20/2015   Glaucoma 06/20/2015   Post-menopausal 06/04/2015    Past Surgical History:  Procedure Laterality Date   ABDOMINAL HYSTERECTOMY     ANAL FISSURE REPAIR  1970s   for bleeding in 1970s   ANTERIOR AND POSTERIOR REPAIR WITH SACROSPINOUS FIXATION N/A 05/30/2018   Procedure: ANTERIOR AND POSTERIOR REPAIR;  Surgeon: Candice Camp, MD;  Location: [facility] ORS;  Service: Gynecology;  Laterality: N/A;   bilateral cataracts removed Bilateral  COLONOSCOPY  2012   [facility]   COLONOSCOPY WITH PROPOFOL N/A 09/01/2016   Procedure: COLONOSCOPY WITH PROPOFOL;  Surgeon: Earline Mayotte, MD;  Location: [facility] ENDOSCOPY;  Service: Endoscopy;  Laterality: N/A;   COLONOSCOPY WITH PROPOFOL N/A 09/10/2021   Procedure: COLONOSCOPY WITH PROPOFOL;  Surgeon: Earline Mayotte, MD;  Location: [facility] ENDOSCOPY;  Service: Endoscopy;  Laterality: N/A;   ESOPHAGOGASTRODUODENOSCOPY (EGD) WITH PROPOFOL N/A 01/05/2023   Procedure: ESOPHAGOGASTRODUODENOSCOPY (EGD) WITH PROPOFOL;  Surgeon: Toney Reil, MD;  Location: [facility] ENDOSCOPY;  Service: Gastroenterology;   Laterality: N/A;   EYE SURGERY Bilateral    cataracts removed   KNEE SURGERY Left    SALPINGOOPHORECTOMY Right 05/30/2018   Procedure: SALPINGO OOPHORECTOMY;  Surgeon: Candice Camp, MD;  Location: [facility];  Service: Gynecology;  Laterality: Right;   SUBCLAVIAN ANGIOGRAM     TOTAL HIP ARTHROPLASTY Left 01/28/2021   Procedure: TOTAL HIP ARTHROPLASTY ANTERIOR APPROACH;  Surgeon: Kennedy Bucker, MD;  Location: [facility] ORS;  Service: Orthopedics;  Laterality: Left;   TOTAL KNEE ARTHROPLASTY Right 04/02/2020   Procedure: TOTAL KNEE ARTHROPLASTY;  Surgeon: Durene Romans, MD;  Location: [facility] ORS;  Service: Orthopedics;  Laterality: Right;  70 mins   UPPER GI ENDOSCOPY  2012   VAGINAL HYSTERECTOMY N/A 05/30/2018   Procedure: HYSTERECTOMY VAGINAL;  Surgeon: Candice Camp, MD;  Location: [facility];  Service: Gynecology;  Laterality: N/A;   WISDOM TOOTH EXTRACTION      Family History  Problem Relation Age of Onset   Cancer Mother 104       colon   Alzheimer's disease Mother    Hypertension Mother    Prostate cancer Father    Hypertension Father    Cerebral aneurysm Father    Diabetes Sister    Depression Sister    Hypertension Sister    Depression Sister    Depression Brother    Cancer Brother        prostate   Cancer Brother    Gallbladder disease Brother    Prostate cancer Brother    Lymphoma Son    Cancer Son 48       lymphoma   Breast cancer Neg Hx     Social History   Tobacco Use   Smoking status: Never   Smokeless tobacco: Never   Tobacco comments:    smoking cessation materials not required  Substance Use Topics   Alcohol use: Yes    Alcohol/week: 0.0 standard drinks of alcohol    Comment: occasional beer/mixed drink     Current Outpatient Medications:    acetaminophen (TYLENOL) 500 MG tablet, Take 500 mg by mouth every 6 (six) hours as needed., Disp: , Rfl:    ALPHAGAN P 0.1 % SOLN, Place 1 drop into both eyes 3 (three) times daily. , Disp: , Rfl:    bimatoprost (LUMIGAN)  0.01 % SOLN, Place 1 drop into both eyes at bedtime., Disp: , Rfl:    buPROPion (WELLBUTRIN XL) 150 MG 24 hr tablet, Take 1 tablet (150 mg total) by mouth in the morning., Disp: 90 tablet, Rfl: 1   citalopram (CELEXA) 20 MG tablet, Take 1 tablet (20 mg total) by mouth daily., Disp: 90 tablet, Rfl: 1   fluticasone (FLONASE) 50 MCG/ACT nasal spray, SPRAY 2 SPRAYS INTO EACH NOSTRIL EVERY DAY, Disp: 48 mL, Rfl: 0   latanoprost (XALATAN) 0.005 % ophthalmic solution, 1 drop., Disp: , Rfl:    omeprazole (PRILOSEC) 40 MG capsule, TAKE 1 CAPSULE (40 MG TOTAL) BY MOUTH DAILY., Disp:  90 capsule, Rfl: 0   oxybutynin (DITROPAN-XL) 5 MG 24 hr tablet, Take 5 mg by mouth daily., Disp: , Rfl:    Polyethyl Glycol-Propyl Glycol 0.4-0.3 % SOLN, Apply to eye. Using Refresh Mega-3, Disp: , Rfl:    pravastatin (PRAVACHOL) 20 MG tablet, Take 1 tablet (20 mg total) by mouth daily., Disp: 90 tablet, Rfl: 1   PREMARIN vaginal cream, Place 1 application vaginally 2 (two) times a week., Disp: , Rfl: 12  Allergies  Allergen Reactions   Biphosphate    Codeine     headaches   Combigan [Brimonidine Tartrate-Timolol]     drowsy   Sulfa Antibiotics Nausea And Vomiting and Nausea Only   Metronidazole Itching and Rash    I personally reviewed active problem list, medication list, allergies, family history, social history, health maintenance with the patient/caregiver today.   ROS  Ten systems reviewed and is negative except as mentioned in HPI   Objective  Vitals:   03/24/23 1013  BP: (!) 140/86  Pulse: 78  Resp: 16  Temp: 98.2 F (36.8 C)  TempSrc: Oral  SpO2: 98%  Weight: 144 lb (65.3 kg)  Height: 5\' 2"  (1.575 m)    Body mass index is 26.34 kg/m.  Physical Exam  Constitutional: Patient appears well-developed and well-nourished. No distress.  HEENT: head atraumatic, normocephalic, pupils equal and reactive to light, neck supple Cardiovascular: Normal rate, regular rhythm and normal heart sounds.  No  murmur heard. No BLE edema. Pulmonary/Chest: Effort normal and breath sounds normal. No respiratory distress. Abdominal: Soft.  There is no tenderness. Muscular skeletal: she has noticed pain with rom of left knee  Psychiatric: Patient has a normal mood and affect. behavior is normal. Judgment and thought content normal.   Recent Results (from the past 2160 hour(s))  Surgical pathology     Status: None   Collection Time: 01/05/23  9:37 AM  Result Value Ref Range   SURGICAL PATHOLOGY      SURGICAL PATHOLOGY CASE: [id number] PATIENT: [facility] Surgical Pathology Report     Specimen Submitted: A. Stomach; cbx B. Esophagus; cbx  Clinical History: Dysphagia R13.19    DIAGNOSIS: A. STOMACH; COLD BIOPSY: - GASTRIC ANTRAL AND OXYNTIC MUCOSA WITH NO SIGNIFICANT HISTOPATHOLOGIC CHANGE. - NEGATIVE FOR H. PYLORI, DYSPLASIA, AND MALIGNANCY.  B. ESOPHAGUS; COLD BIOPSY: - BENIGN SQUAMOUS MUCOSA WITH NO SIGNIFICANT HISTOPATHOLOGIC CHANGE. - NO INCREASE IN INTRAEPITHELIAL EOSINOPHILS (LESS THAN 2 PER HPF). - NEGATIVE FOR DYSPLASIA AND MALIGNANCY.  GROSS DESCRIPTION: A. Labeled: Cbx gastric for gastric erythema Received: Formalin Collection time: 9:37 AM on 01/05/2023 Placed into formalin time: 9:37 AM on 01/05/2023 Tissue fragment(s): Multiple Size: Aggregate, 1.0 x 0.3 x 0.1 cm Description: Pink soft tissue fragments Entirely submitted in 1 cassette.  B. Labeled: Cbx esophageal for dysphagia Received: Formalin Collection time: 9:41 AM on  01/05/2023 Placed into formalin time: 9:41 AM on 01/05/2023 Tissue fragment(s): Multiple Size: Aggregate, 1.1 x 0.4 x 0.1 cm Description: Pale pink soft tissue fragments Entirely submitted in 1 cassette.  CM 01/05/2023  Final Diagnosis performed by Katherine Mantle, MD.   Electronically signed 01/06/2023 10:37:42AM The electronic signature indicates that the named Attending Pathologist has evaluated the specimen Technical component  performed at [facility], [address], [place], [place] [address] Lab: [phone] Dir: Jolene Schimke, MD, MMM  Professional component performed at [facility], [address] [place], [place], [place] [address] Lab: [phone] Dir: Beryle Quant, MD     PHQ2/9:    03/24/2023  10:12 AM 12/07/2022    3:25 PM 11/05/2022    8:14 AM 09/21/2022    8:47 AM 09/18/2022    8:12 AM  Depression screen PHQ 2/9  Decreased Interest 1 1 0 0 0  Down, Depressed, Hopeless 0 1 0 0 0  PHQ - 2 Score 1 2 0 0 0  Altered sleeping 0 0 0 0   Tired, decreased energy 0 1 0 0   Change in appetite 0 0 0 0   Feeling bad or failure about yourself  0 0 0 0   Trouble concentrating 0 1 0 0   Moving slowly or fidgety/restless 0 0 0 0   Suicidal thoughts 0 0 0 0   PHQ-9 Score 1 4 0 0   Difficult doing work/chores  Not difficult at all Not difficult at all Not difficult at all     phq 9 is positive   Fall Risk:    03/24/2023   10:12 AM 12/07/2022    3:25 PM 11/05/2022    8:14 AM 09/21/2022    8:47 AM 09/18/2022    8:11 AM  Fall Risk   Falls in the past year? 0 0 0 0 0  Number falls in past yr:   0 0 0  Injury with Fall?   0 0 0  Risk for fall due to : No Fall Risks No Fall Risks     Follow up Falls prevention discussed Falls prevention discussed;Education provided;Falls evaluation completed   Falls evaluation completed      Functional Status Survey: Is the patient deaf or have difficulty hearing?: No Does the patient have difficulty seeing, even when wearing glasses/contacts?: No Does the patient have difficulty concentrating, remembering, or making decisions?: No Does the patient have difficulty walking or climbing stairs?: No Does the patient have difficulty dressing or bathing?: No Does the patient have difficulty doing errands alone such as visiting a doctor's office or shopping?: No    Assessment & Plan   1. B12 deficiency  - cyanocobalamin (VITAMIN B12) injection 1,000  mcg  2. Dyslipidemia  - pravastatin (PRAVACHOL) 20 MG tablet; Take 1 tablet (20 mg total) by mouth daily.  Dispense: 90 tablet; Refill: 1  3. GERD without esophagitis  On PPI   4. Senile purpura (HCC)  Stable  5. Age-related osteoporosis without current pathological fracture  Keep follow up with Dr. Tedd Sias   6. Chronic midline low back pain without sciatica  Doing better   7. Hiatal hernia  Continue PPI   8. Primary osteoarthritis of both knees  - celecoxib (CELEBREX) 100 MG capsule; Take 1 capsule (100 mg total) by mouth 2 (two) times daily as needed.  Dispense: 60 capsule; Refill: 0  9. History of total left hip replacement

## 2023-03-24 ENCOUNTER — Ambulatory Visit (INDEPENDENT_AMBULATORY_CARE_PROVIDER_SITE_OTHER): Payer: Medicare HMO | Admitting: Family Medicine

## 2023-03-24 ENCOUNTER — Encounter: Payer: Self-pay | Admitting: Family Medicine

## 2023-03-24 VITALS — BP 132/78 | HR 78 | Temp 98.2°F | Resp 16 | Ht 62.0 in | Wt 144.0 lb

## 2023-03-24 DIAGNOSIS — E538 Deficiency of other specified B group vitamins: Secondary | ICD-10-CM

## 2023-03-24 DIAGNOSIS — M545 Low back pain, unspecified: Secondary | ICD-10-CM

## 2023-03-24 DIAGNOSIS — G8929 Other chronic pain: Secondary | ICD-10-CM | POA: Diagnosis not present

## 2023-03-24 DIAGNOSIS — Z96642 Presence of left artificial hip joint: Secondary | ICD-10-CM | POA: Diagnosis not present

## 2023-03-24 DIAGNOSIS — K449 Diaphragmatic hernia without obstruction or gangrene: Secondary | ICD-10-CM | POA: Diagnosis not present

## 2023-03-24 DIAGNOSIS — E785 Hyperlipidemia, unspecified: Secondary | ICD-10-CM | POA: Diagnosis not present

## 2023-03-24 DIAGNOSIS — M170 Bilateral primary osteoarthritis of knee: Secondary | ICD-10-CM | POA: Diagnosis not present

## 2023-03-24 DIAGNOSIS — K219 Gastro-esophageal reflux disease without esophagitis: Secondary | ICD-10-CM | POA: Diagnosis not present

## 2023-03-24 DIAGNOSIS — D692 Other nonthrombocytopenic purpura: Secondary | ICD-10-CM

## 2023-03-24 DIAGNOSIS — M810 Age-related osteoporosis without current pathological fracture: Secondary | ICD-10-CM | POA: Diagnosis not present

## 2023-03-24 MED ORDER — PRAVASTATIN SODIUM 20 MG PO TABS: 20.0000 mg | ORAL_TABLET | Freq: Every day | ORAL | 1 refills | Status: DC

## 2023-03-24 MED ORDER — CYANOCOBALAMIN 1000 MCG/ML IJ SOLN
1000.0000 ug | Freq: Once | INTRAMUSCULAR | Status: AC
  Administered 2023-03-24: 1000 ug via INTRAMUSCULAR

## 2023-03-24 MED ORDER — CELECOXIB 100 MG PO CAPS: 100.0000 mg | ORAL_CAPSULE | Freq: Two times a day (BID) | ORAL | 0 refills | Status: DC | PRN

## 2023-03-26 ENCOUNTER — Emergency Department: Payer: Worker's Compensation

## 2023-03-26 ENCOUNTER — Other Ambulatory Visit: Payer: Self-pay

## 2023-03-26 ENCOUNTER — Encounter: Payer: Self-pay | Admitting: Emergency Medicine

## 2023-03-26 ENCOUNTER — Emergency Department
Admission: EM | Admit: 2023-03-26 | Discharge: 2023-03-26 | Disposition: A | Discharge status: Home / Self Care | Payer: Worker's Compensation | Attending: Emergency Medicine | Admitting: Emergency Medicine

## 2023-03-26 DIAGNOSIS — Y990 Civilian activity done for income or pay: Secondary | ICD-10-CM | POA: Insufficient documentation

## 2023-03-26 DIAGNOSIS — M25569 Pain in unspecified knee: Secondary | ICD-10-CM | POA: Diagnosis not present

## 2023-03-26 DIAGNOSIS — W19XXXA Unspecified fall, initial encounter: Secondary | ICD-10-CM

## 2023-03-26 DIAGNOSIS — S0990XA Unspecified injury of head, initial encounter: Secondary | ICD-10-CM | POA: Diagnosis not present

## 2023-03-26 DIAGNOSIS — W01198A Fall on same level from slipping, tripping and stumbling with subsequent striking against other object, initial encounter: Secondary | ICD-10-CM | POA: Diagnosis not present

## 2023-03-26 NOTE — ED Provider Triage Note (Signed)
Emergency Medicine Provider Triage Evaluation Note  Amiyla Plott Wrightsman , a 82 y.o. female  was evaluated in triage.  Pt complains of fall, head injury and R lower leg. Hit head, no loc. No back/pelvis/hip pain.  Review of Systems  Positive: HA, leg pain Negative: Loc, blurred vision, back pain, hip pain  Physical Exam  BP (!) 189/88   Pulse 75   Temp 97.7 F (36.5 C)   Resp 18   Ht 5\' 2"  (1.575 m)   Wt 65 kg   SpO2 98%   BMI 26.21 kg/m  Gen:   Awake, no distress   Resp:  Normal effort  MSK:   Moves extremities without difficulty  Other:    Medical Decision Making  Medically screening exam initiated at 4:01 PM.  Appropriate orders placed.  Dyemond Worman Bump was informed that the remainder of the evaluation will be completed by another provider, this initial triage assessment does not replace that evaluation, and the importance of remaining in the ED until their evaluation is complete.  CTs  and xrays ordered   Racheal Patches, PA-C 03/26/23 1601

## 2023-03-26 NOTE — ED Notes (Signed)
Pt provided discharge instructions and prescription information. Pt was given the opportunity to ask questions and questions were answered.   

## 2023-03-26 NOTE — ED Triage Notes (Signed)
Pt via POV from [facility]. Pt was at work and had a mechanical fall tripping on a mat. Pt c/o L lower leg pain and head. + head injury. Denies blood thinners. Pt is A&OX4 and NAD. Ambulatory to triage.

## 2023-03-26 NOTE — ED Provider Notes (Signed)
   [facility] Provider Note    Event Date/Time   First MD Initiated Contact with Patient 03/26/23 1648     (approximate)   History   Fall   HPI  [name] is a 82 y.o. female who presents after a fall with a head injury.  No bleeding or laceration.  She reports it was mechanical fall, she tripped over a raised area.  Denies nausea or vomiting.  Not on blood thinners. also some discomfort in her knee     Physical Exam   Triage Vital Signs: ED Triage Vitals  Enc Vitals Group     BP 03/26/23 1553 (!) 189/88     Pulse Rate 03/26/23 1553 75     Resp 03/26/23 1553 18     Temp 03/26/23 1553 97.7 F (36.5 C)     Temp src --      SpO2 03/26/23 1553 98 %     Weight 03/26/23 1551 65 kg (143 lb 4.8 oz)     Height 03/26/23 1551 1.575 m (5\' 2" )     Head Circumference --      Peak Flow --      Pain Score 03/26/23 1551 4     Pain Loc --      Pain Edu? --      Excl. in GC? --     Most recent vital signs: Vitals:   03/26/23 1553 03/26/23 1739  BP: (!) 189/88 (!) 188/82  Pulse: 75 72  Resp: 18 18  Temp: 97.7 F (36.5 C)   SpO2: 98% 100%     General: Awake, no distress.  No evidence of head injury CV:  Good peripheral perfusion.  Resp:  Normal effort.  Abd:  No distention.  Soft, nontender Other:  Ambulating well, normal range of motion of the lower extremities, no pain with axial load. No vertebral tenderness palpation   ED Results / Procedures / Treatments   Labs (all labs ordered are listed, but only abnormal results are displayed) Labs Reviewed - No data to display   EKG     RADIOLOGY CT head cervical spine viewed interpreted by me, no acute abnormality noted, pending radiology review    PROCEDURES:  Critical Care performed:   Procedures   MEDICATIONS ORDERED IN ED: Medications - No data to display   IMPRESSION / MDM / ASSESSMENT AND PLAN / ED COURSE  I reviewed the triage vital signs and the nursing  notes. Patient's presentation is most consistent with acute illness / injury with system symptoms.  Patient presents after a fall with head injury, no abdominal pain, no chest wall pain.  Mechanical fall.  CT imaging is reassuring, x-ray of the knee is unremarkable, she is ambulating well and feels good, appropriate discharge at this time        FINAL CLINICAL IMPRESSION(S) / ED DIAGNOSES   Final diagnoses:  Fall, initial encounter  Injury of head, initial encounter     Rx / DC Orders   ED Discharge Orders     None        Note:  This document was prepared using Dragon voice recognition software and may include unintentional dictation errors.   Jene Every, MD 03/26/23 [phone]

## 2023-03-26 NOTE — ED Triage Notes (Signed)
Brought from [facility]. Fall X30 minutes ago at work. Tripped over rug and hit back of head. C/o dizziness and throbbing headache.   [facility] vitals: 185/77b/p

## 2023-03-30 ENCOUNTER — Other Ambulatory Visit: Payer: Self-pay | Admitting: Family Medicine

## 2023-03-30 DIAGNOSIS — E041 Nontoxic single thyroid nodule: Secondary | ICD-10-CM

## 2023-04-07 DIAGNOSIS — Z96651 Presence of right artificial knee joint: Secondary | ICD-10-CM | POA: Diagnosis not present

## 2023-04-07 DIAGNOSIS — M7541 Impingement syndrome of right shoulder: Secondary | ICD-10-CM | POA: Diagnosis not present

## 2023-04-07 DIAGNOSIS — M1712 Unilateral primary osteoarthritis, left knee: Secondary | ICD-10-CM | POA: Diagnosis not present

## 2023-04-08 ENCOUNTER — Ambulatory Visit
Admission: RE | Admit: 2023-04-08 | Discharge: 2023-04-08 | Disposition: A | Discharge status: Home / Self Care | Payer: Medicare HMO | Source: Ambulatory Visit | Attending: Family Medicine | Admitting: Family Medicine

## 2023-04-08 DIAGNOSIS — E041 Nontoxic single thyroid nodule: Secondary | ICD-10-CM | POA: Insufficient documentation

## 2023-04-09 ENCOUNTER — Encounter: Payer: Self-pay | Admitting: Family Medicine

## 2023-04-09 DIAGNOSIS — E042 Nontoxic multinodular goiter: Secondary | ICD-10-CM | POA: Insufficient documentation

## 2023-04-20 ENCOUNTER — Other Ambulatory Visit: Payer: Self-pay | Admitting: Family Medicine

## 2023-04-20 DIAGNOSIS — M170 Bilateral primary osteoarthritis of knee: Secondary | ICD-10-CM

## 2023-05-12 ENCOUNTER — Encounter: Payer: Medicare HMO | Admitting: Family Medicine

## 2023-05-26 ENCOUNTER — Other Ambulatory Visit: Payer: Self-pay | Admitting: Family Medicine

## 2023-05-26 DIAGNOSIS — J3489 Other specified disorders of nose and nasal sinuses: Secondary | ICD-10-CM

## 2023-05-30 ENCOUNTER — Other Ambulatory Visit: Payer: Self-pay | Admitting: Family Medicine

## 2023-05-30 DIAGNOSIS — F339 Major depressive disorder, recurrent, unspecified: Secondary | ICD-10-CM

## 2023-05-31 ENCOUNTER — Other Ambulatory Visit: Payer: Self-pay

## 2023-05-31 DIAGNOSIS — M810 Age-related osteoporosis without current pathological fracture: Secondary | ICD-10-CM | POA: Diagnosis not present

## 2023-05-31 DIAGNOSIS — F339 Major depressive disorder, recurrent, unspecified: Secondary | ICD-10-CM

## 2023-06-02 DIAGNOSIS — H401132 Primary open-angle glaucoma, bilateral, moderate stage: Secondary | ICD-10-CM | POA: Diagnosis not present

## 2023-06-09 DIAGNOSIS — H401132 Primary open-angle glaucoma, bilateral, moderate stage: Secondary | ICD-10-CM | POA: Diagnosis not present

## 2023-06-10 ENCOUNTER — Ambulatory Visit (INDEPENDENT_AMBULATORY_CARE_PROVIDER_SITE_OTHER): Payer: Medicare HMO

## 2023-06-10 VITALS — Ht 62.0 in | Wt 142.0 lb

## 2023-06-10 DIAGNOSIS — Z0000 Encounter for general adult medical examination without abnormal findings: Secondary | ICD-10-CM | POA: Diagnosis not present

## 2023-06-10 NOTE — Patient Instructions (Addendum)
[name] , Thank you for taking time to come for your Medicare Wellness Visit. I appreciate your ongoing commitment to your health goals. Please review the following plan we discussed and let me know if I can assist you in the future.   Referrals/Orders/Follow-Ups/Clinician Recommendations:   This is a list of the screening recommended for you and due dates:  Health Maintenance  Topic Date Due   COVID-19 Vaccine (4 - 2023-24 season) 06/26/2023*   Flu Shot  06/17/2023   Medicare Annual Wellness Visit  06/09/2024   Pneumonia Vaccine  Completed   DEXA scan (bone density measurement)  Completed   Zoster (Shingles) Vaccine  Completed   HPV Vaccine  Aged Out   DTaP/Tdap/Td vaccine  Discontinued   Hepatitis C Screening  Discontinued  *Topic was postponed. The date shown is not the original due date.    Advanced directives: (Copy Requested) Please bring a copy of your health care power of attorney and living will to the office to be added to your chart at your convenience.  Next Medicare Annual Wellness Visit scheduled for next year: Yes  Preventive Care 42 Years and Older, Female Preventive care refers to lifestyle choices and visits with your health care provider that can promote health and wellness. What does preventive care include? A yearly physical exam. This is also called an annual well check. Dental exams once or twice a year. Routine eye exams. Ask your health care provider how often you should have your eyes checked. Personal lifestyle choices, including: Daily care of your teeth and gums. Regular physical activity. Eating a healthy diet. Avoiding tobacco and drug use. Limiting alcohol use. Practicing safe sex. Taking low-dose aspirin every day. Taking vitamin and mineral supplements as recommended by your health care provider. What happens during an annual well check? The services and screenings done by your health care provider during your annual well check will  depend on your age, overall health, lifestyle risk factors, and family history of disease. Counseling  Your health care provider may ask you questions about your: Alcohol use. Tobacco use. Drug use. Emotional well-being. Home and relationship well-being. Sexual activity. Eating habits. History of falls. Memory and ability to understand (cognition). Work and work Astronomer. Reproductive health. Screening  You may have the following tests or measurements: Height, weight, and BMI. Blood pressure. Lipid and cholesterol levels. These may be checked every 5 years, or more frequently if you are over 76 years old. Skin check. Lung cancer screening. You may have this screening every year starting at age 80 if you have a 30-pack-year history of smoking and currently smoke or have quit within the past 15 years. Fecal occult blood test (FOBT) of the stool. You may have this test every year starting at age 82. Flexible sigmoidoscopy or colonoscopy. You may have a sigmoidoscopy every 5 years or a colonoscopy every 10 years starting at age 60. Hepatitis C blood test. Hepatitis B blood test. Sexually transmitted disease (STD) testing. Diabetes screening. This is done by checking your blood sugar (glucose) after you have not eaten for a while (fasting). You may have this done every 1-3 years. Bone density scan. This is done to screen for osteoporosis. You may have this done starting at age 34. Mammogram. This may be done every 1-2 years. Talk to your health care provider about how often you should have regular mammograms. Talk with your health care provider about your test results, treatment options, and if necessary, the need for more tests.  Vaccines  Your health care provider may recommend certain vaccines, such as: Influenza vaccine. This is recommended every year. Tetanus, diphtheria, and acellular pertussis (Tdap, Td) vaccine. You may need a Td booster every 10 years. Zoster vaccine. You may  need this after age 36. Pneumococcal 13-valent conjugate (PCV13) vaccine. One dose is recommended after age 80. Pneumococcal polysaccharide (PPSV23) vaccine. One dose is recommended after age 52. Talk to your health care provider about which screenings and vaccines you need and how often you need them. This information is not intended to replace advice given to you by your health care provider. Make sure you discuss any questions you have with your health care provider. Document Released: 11/29/2015 Document Revised: 07/22/2016 Document Reviewed: 09/03/2015 Elsevier Interactive Patient Education  2017 [employer].  Fall Prevention in the Home Falls can cause injuries. They can happen to people of all ages. There are many things you can do to make your home safe and to help prevent falls. What can I do on the outside of my home? Regularly fix the edges of walkways and driveways and fix any cracks. Remove anything that might make you trip as you walk through a door, such as a raised step or threshold. Trim any bushes or trees on the path to your home. Use bright outdoor lighting. Clear any walking paths of anything that might make someone trip, such as rocks or tools. Regularly check to see if handrails are loose or broken. Make sure that both sides of any steps have handrails. Any raised decks and porches should have guardrails on the edges. Have any leaves, snow, or ice cleared regularly. Use sand or salt on walking paths during winter. Clean up any spills in your garage right away. This includes oil or grease spills. What can I do in the bathroom? Use night lights. Install grab bars by the toilet and in the tub and shower. Do not use towel bars as grab bars. Use non-skid mats or decals in the tub or shower. If you need to sit down in the shower, use a plastic, non-slip stool. Keep the floor dry. Clean up any water that spills on the floor as soon as it happens. Remove soap buildup in  the tub or shower regularly. Attach bath mats securely with double-sided non-slip rug tape. Do not have throw rugs and other things on the floor that can make you trip. What can I do in the bedroom? Use night lights. Make sure that you have a light by your bed that is easy to reach. Do not use any sheets or blankets that are too big for your bed. They should not hang down onto the floor. Have a firm chair that has side arms. You can use this for support while you get dressed. Do not have throw rugs and other things on the floor that can make you trip. What can I do in the kitchen? Clean up any spills right away. Avoid walking on wet floors. Keep items that you use a lot in easy-to-reach places. If you need to reach something above you, use a strong step stool that has a grab bar. Keep electrical cords out of the way. Do not use floor polish or wax that makes floors slippery. If you must use wax, use non-skid floor wax. Do not have throw rugs and other things on the floor that can make you trip. What can I do with my stairs? Do not leave any items on the stairs. Make sure that  there are handrails on both sides of the stairs and use them. Fix handrails that are broken or loose. Make sure that handrails are as long as the stairways. Check any carpeting to make sure that it is firmly attached to the stairs. Fix any carpet that is loose or worn. Avoid having throw rugs at the top or bottom of the stairs. If you do have throw rugs, attach them to the floor with carpet tape. Make sure that you have a light switch at the top of the stairs and the bottom of the stairs. If you do not have them, ask someone to add them for you. What else can I do to help prevent falls? Wear shoes that: Do not have high heels. Have rubber bottoms. Are comfortable and fit you well. Are closed at the toe. Do not wear sandals. If you use a stepladder: Make sure that it is fully opened. Do not climb a closed  stepladder. Make sure that both sides of the stepladder are locked into place. Ask someone to hold it for you, if possible. Clearly mark and make sure that you can see: Any grab bars or handrails. First and last steps. Where the edge of each step is. Use tools that help you move around (mobility aids) if they are needed. These include: Canes. Walkers. Scooters. Crutches. Turn on the lights when you go into a dark area. Replace any light bulbs as soon as they burn out. Set up your furniture so you have a clear path. Avoid moving your furniture around. If any of your floors are uneven, fix them. If there are any pets around you, be aware of where they are. Review your medicines with your doctor. Some medicines can make you feel dizzy. This can increase your chance of falling. Ask your doctor what other things that you can do to help prevent falls. This information is not intended to replace advice given to you by your health care provider. Make sure you discuss any questions you have with your health care provider. Document Released: 08/29/2009 Document Revised: 04/09/2016 Document Reviewed: 12/07/2014 Elsevier Interactive Patient Education  2017 [employer].

## 2023-06-10 NOTE — Progress Notes (Signed)
Subjective:   [name] is a 82 y.o. female who presents for Medicare Annual (Subsequent) preventive examination.  Visit Complete: Virtual  I connected with  [name] on 06/10/23 by a audio enabled telemedicine application and verified that I am speaking with the correct person using two identifiers.  Patient Location: Home  Provider Location: Home Office  I discussed the limitations of evaluation and management by telemedicine. The patient expressed understanding and agreed to proceed.  Patient Medicare AWV questionnaire was completed by the patient on ; I have confirmed that all information answered by patient is correct and no changes since this date.  Review of Systems     Cardiac Risk Factors include: advanced age (>25men, >59 women);dyslipidemia     Objective:    Today's Vitals   06/10/23 1336  Weight: 142 lb (64.4 kg)  Height: 5\' 2"  (1.575 m)   Body mass index is 25.97 kg/m.     06/10/2023    1:45 PM 03/26/2023    3:52 PM 01/05/2023    9:12 AM 09/10/2021    8:39 AM 04/22/2021   11:52 AM 01/29/2021   11:00 PM 01/27/2021    7:43 AM  Advanced Directives  Does Patient Have a Medical Advance Directive? Yes Yes Yes Yes Yes No No  Type of Estate agent of [place];Living will Healthcare Power of [place];Living will  Living will Healthcare Power of [place];Living will    Copy of Healthcare Power of Attorney in Chart? No - copy requested    No - copy requested    Would patient like information on creating a medical advance directive?      No - Patient declined     Current Medications (verified) Outpatient Encounter Medications as of 06/10/2023  Medication Sig   acetaminophen (TYLENOL) 500 MG tablet Take 500 mg by mouth every 6 (six) hours as needed.   ALPHAGAN P 0.1 % SOLN Place 1 drop into both eyes 3 (three) times daily.    bimatoprost (LUMIGAN) 0.01 % SOLN Place 1 drop into both eyes at bedtime.   buPROPion (WELLBUTRIN XL) 150  MG 24 hr tablet TAKE 1 TABLET (150 MG TOTAL) BY MOUTH IN THE MORNING   celecoxib (CELEBREX) 100 MG capsule TAKE 1 CAPSULE BY MOUTH 2 TIMES DAILY AS NEEDED.   citalopram (CELEXA) 20 MG tablet TAKE 1 TABLET BY MOUTH EVERY DAY   fluticasone (FLONASE) 50 MCG/ACT nasal spray SPRAY 2 SPRAYS INTO EACH NOSTRIL EVERY DAY   latanoprost (XALATAN) 0.005 % ophthalmic solution 1 drop.   omeprazole (PRILOSEC) 40 MG capsule TAKE 1 CAPSULE (40 MG TOTAL) BY MOUTH DAILY.   oxybutynin (DITROPAN-XL) 5 MG 24 hr tablet Take 5 mg by mouth daily.   Polyethyl Glycol-Propyl Glycol 0.4-0.3 % SOLN Apply to eye. Using Refresh Mega-3   pravastatin (PRAVACHOL) 20 MG tablet Take 1 tablet (20 mg total) by mouth daily.   PREMARIN vaginal cream Place 1 application vaginally 2 (two) times a week.   No facility-administered encounter medications on file as of 06/10/2023.    Allergies (verified) Biphosphate, Codeine, Combigan [brimonidine tartrate-timolol], Sulfa antibiotics, and Metronidazole   History: Past Medical History:  Diagnosis Date   Anemia    Anxiety    no meds   Arthritis    hands, knees - no meds   Bilateral leg weakness    Complication of anesthesia    Difficult to arouse   Dental bridge present    perm lower front bottom bridge  Depression    no meds   Eyes swollen    and red - was seen by MD for possible allergy to new eye drops   GERD (gastroesophageal reflux disease)    diet controlled - only takes med prn basis   Glaucoma    Hypercholesteremia    Hypertension    Osteoporosis    SVD (spontaneous vaginal delivery)    x 3 - only one currently living   Vertigo    Past Surgical History:  Procedure Laterality Date   ABDOMINAL HYSTERECTOMY     ANAL FISSURE REPAIR  1970s   for bleeding in 1970s   ANTERIOR AND POSTERIOR REPAIR WITH SACROSPINOUS FIXATION N/A 05/30/2018   Procedure: ANTERIOR AND POSTERIOR REPAIR;  Surgeon: Candice Camp, MD;  Location: [facility];  Service: Gynecology;  Laterality: N/A;    bilateral cataracts removed Bilateral    COLONOSCOPY  2012   [facility]   COLONOSCOPY WITH PROPOFOL N/A 09/01/2016   Procedure: COLONOSCOPY WITH PROPOFOL;  Surgeon: Earline Mayotte, MD;  Location: [facility] END[facility]COPY;  Service: Endoscopy;  Laterality: N/A;   COLONOSCOPY WITH PROPOFOL N/A 09/10/2021   Procedure: COLONOSCOPY WITH PROPOFOL;  Surgeon: Earline Mayotte, MD;  Location: [facility] END[facility]COPY;  Service: Endoscopy;  Laterality: N/A;   ESOPHAGOGASTRODUODENOSCOPY (EGD) WITH PROPOFOL N/A 01/05/2023   Procedure: ESOPHAGOGASTRODUODENOSCOPY (EGD) WITH PROPOFOL;  Surgeon: Toney Reil, MD;  Location: [facility];  Service: Gastroenterology;  Laterality: N/A;   EYE SURGERY Bilateral    cataracts removed   KNEE SURGERY Left    SALPINGOOPHORECTOMY Right 05/30/2018   Procedure: SALPINGO OOPHORECTOMY;  Surgeon: Candice Camp, MD;  Location: [facility];  Service: Gynecology;  Laterality: Right;   SUBCLAVIAN ANGIOGRAM     TOTAL HIP ARTHROPLASTY Left 01/28/2021   Procedure: TOTAL HIP ARTHROPLASTY ANTERIOR APPROACH;  Surgeon: Kennedy Bucker, MD;  Location: [facility];  Service: Orthopedics;  Laterality: Left;   TOTAL KNEE ARTHROPLASTY Right 04/02/2020   Procedure: TOTAL KNEE ARTHROPLASTY;  Surgeon: Durene Romans, MD;  Location: [facility];  Service: Orthopedics;  Laterality: Right;  70 mins   UPPER GI ENDOSCOPY  2012   VAGINAL HYSTERECTOMY N/A 05/30/2018   Procedure: HYSTERECTOMY VAGINAL;  Surgeon: Candice Camp, MD;  Location: [facility];  Service: Gynecology;  Laterality: N/A;   WISDOM TOOTH EXTRACTION     Family History  Problem Relation Age of Onset   Cancer Mother 46       colon   Alzheimer's disease Mother    Hypertension Mother    Prostate cancer Father    Hypertension Father    Cerebral aneurysm Father    Diabetes Sister    Depression Sister    Hypertension Sister    Depression Sister    Depression Brother    Cancer Brother        prostate   Cancer Brother    Gallbladder disease Brother     Prostate cancer Brother    Lymphoma Son    Cancer Son 48       lymphoma   Breast cancer Neg Hx    Social History   Socioeconomic History   Marital status: Widowed    Spouse name: Reita Cliche   Number of children: 3   Years of education: Not on file   Highest education level: 12th grade  Occupational History    Employer: [employer]    Comment: retired  Tobacco Use   Smoking status: Never   Smokeless tobacco: Never   Tobacco comments:    smoking cessation materials  not required  Vaping Use   Vaping status: Never Used  Substance and Sexual Activity   Alcohol use: Yes    Alcohol/week: 0.0 standard drinks of alcohol    Comment: occasional beer/mixed drink   Drug use: No   Sexual activity: Yes    Partners: Male    Birth control/protection: Post-menopausal  Other Topics Concern   Not on file  Social History Narrative   Pt lives alone. Significant other passed away in 2020-12-11  Social Determinants of Health   Financial Resource Strain: Low Risk  (06/10/2023)   Overall Financial Resource Strain (CARDIA)    Difficulty of Paying Living Expenses: Not hard at all  Food Insecurity: No Food Insecurity (06/10/2023)   Hunger Vital Sign    Worried About Running Out of Food in the Last Year: Never true    Ran Out of Food in the Last Year: Never true  Transportation Needs: No Transportation Needs (06/10/2023)   PRAPARE - Administrator, Civil Service (Medical): No    Lack of Transportation (Non-Medical): No  Physical Activity: Insufficiently Active (06/10/2023)   Exercise Vital Sign    Days of Exercise per Week: 2 days    Minutes of Exercise per Session: 30 min  Stress: No Stress Concern Present (06/10/2023)   [employer] of Occupational Health - Occupational Stress Questionnaire    Feeling of Stress : Not at all  Social Connections: Moderately Integrated (06/10/2023)   Social Connection and Isolation Panel [NHANES]    Frequency of Communication with Friends and Family: More  than three times a week    Frequency of Social Gatherings with Friends and Family: More than three times a week    Attends Religious Services: More than 4 times per year    Active Member of [employer] or Organizations: Yes    Attends Banker Meetings: More than 4 times per year    Marital Status: Widowed    Tobacco Counseling Counseling given: Not Answered Tobacco comments: smoking cessation materials not required   Clinical Intake:  Pre-visit preparation completed: No  Pain : No/denies pain     BMI - recorded: 25.97 Nutritional Status: BMI 25 -29 Overweight Nutritional Risks: None Diabetes: No  How often do you need to have someone help you when you read instructions, pamphlets, or other written materials from your doctor or pharmacy?: 1 - Never  Interpreter Needed?: No  Information entered by :: Theresa Mulligan LPN   Activities of Daily Living    06/10/2023    1:43 PM 03/24/2023   10:12 AM  In your present state of health, do you have any difficulty performing the following activities:  Hearing? 0 0  Vision? 0 0  Difficulty concentrating or making decisions? 0 0  Walking or climbing stairs? 0 0  Dressing or bathing? 0 0  Doing errands, shopping? 0 0  Preparing Food and eating ? N   Using the Toilet? N   In the past six months, have you accidently leaked urine? N   Do you have problems with loss of bowel control? N   Managing your Medications? N   Managing your Finances? N   Housekeeping or managing your Housekeeping? N     Patient Care Team: Alba Cory, MD as PCP - General (Family Medicine) Sallee Lange, MD as Consulting Physician (Ophthalmology) Candice Camp, MD as Consulting Physician (Obstetrics and Gynecology)  Indicate any recent Medical Services you may have received from other than [facility] providers in  the past year (date may be approximate).     Assessment:   This is a routine wellness examination for Leeba.  Hearing/Vision  screen Hearing Screening - Comments:: Denies hearing difficulties   Vision Screening - Comments:: Wears rx glasses - up to date with [facility] exams [facility]  Dietary issues and exercise activities discussed:     Goals Addressed               This Visit's Progress     Stay healthy (pt-stated)         Depression Screen    06/10/2023    1:41 PM 03/24/2023   10:12 AM 12/07/2022    3:25 PM 11/05/2022    8:14 AM 09/21/2022    8:47 AM 09/18/2022    8:12 AM 09/02/2022    2:50 PM  PHQ 2/9 Scores  PHQ - 2 Score 0 1 2 0 0 0 1  PHQ- 9 Score  1 4 0 0  2    Fall Risk    06/10/2023    1:44 PM 03/24/2023   10:12 AM 12/07/2022    3:25 PM 11/05/2022    8:14 AM 09/21/2022    8:47 AM  Fall Risk   Falls in the past year? 1 0 0 0 0  Number falls in past yr: 0   0 0  Injury with Fall? 0   0 0  Comment Followed by medical attention      Risk for fall due to : No Fall Risks No Fall Risks No Fall Risks    Follow up Falls prevention discussed Falls prevention discussed Falls prevention discussed;Education provided;Falls evaluation completed      MEDICARE RISK AT HOME:  Medicare Risk at Home - 06/10/23 1349     Any stairs in or around the home? No    If so, are there any without handrails? No    Home free of loose throw rugs in walkways, pet beds, electrical cords, etc? Yes    Adequate lighting in your home to reduce risk of falls? Yes    Life alert? No    Use of a cane, walker or w/c? No    Grab bars in the bathroom? No    Shower chair or bench in shower? No    Elevated toilet seat or a handicapped toilet? No             TIMED UP AND GO:  Was the test performed?  No    Cognitive Function:        06/10/2023    1:46 PM 06/04/2022   10:29 AM 01/25/2020    1:46 PM 02/08/2018    2:20 PM 03/01/2017    2:38 PM  6CIT Screen  What Year? 0 points 0 points 0 points 0 points 0 points  What month? 0 points 0 points 0 points 0 points 0 points  What time? 0 points 0  points 0 points 0 points 0 points  Count back from 20 0 points 0 points 0 points 0 points 0 points  Months in reverse 0 points 0 points 0 points 0 points 0 points  Repeat phrase 0 points 0 points 0 points 0 points 4 points  Total Score 0 points 0 points 0 points 0 points 4 points    Immunizations Immunization History  Administered Date(s) Administered   Fluad Quad(high Dose 65+) 09/05/2019, 08/26/2020, 07/23/2021, 07/18/2022   Influenza, High Dose Seasonal PF 12/25/2015, 10/20/2016, 10/15/2017, 12/30/2018  Influenza,inj,Quad PF,6+ Mos 09/04/2014   Moderna Sars-Covid-2 Vaccination 10/27/2020   PFIZER(Purple Top)SARS-COV-2 Vaccination 02/10/2020, 03/01/2020   Pneumococcal Conjugate-13 03/01/2017   Pneumococcal Polysaccharide-23 05/14/2014   Tdap 11/17/2011   Zoster Recombinant(Shingrix) 07/18/2022, 10/13/2022      Flu Vaccine status: Up to date  Pneumococcal vaccine status: Up to date  Covid-19 vaccine status: Completed vaccines  Qualifies for Shingles Vaccine? Yes   Zostavax completed Yes   Shingrix Completed?: Yes  Screening Tests Health Maintenance  Topic Date Due   COVID-19 Vaccine (4 - 2023-24 season) 06/26/2023 (Originally 07/17/2022)   INFLUENZA VACCINE  06/17/2023   Medicare Annual Wellness (AWV)  06/09/2024   Pneumonia Vaccine 29+ Years old  Completed   DEXA SCAN  Completed   Zoster Vaccines- Shingrix  Completed   HPV VACCINES  Aged Out   DTaP/Tdap/Td  Discontinued   Hepatitis C Screening  Discontinued    Health Maintenance  There are no preventive care reminders to display for this patient.   Colorectal cancer screening: No longer required.   Mammogram status: No longer required due to Age.  Bone Density status: Completed 02/20/21. Results reflect: Bone density results: OSTEOPOROSIS. Repeat every   years.  Lung Cancer Screening: (Low Dose CT Chest recommended if Age 73-80 years, 20 pack-year currently smoking OR have quit w/in 15years.) does not qualify.      Additional Screening:  Hepatitis C Screening: does not qualify; Completed    Vision Screening: Recommended annual ophthalmology exams for early detection of glaucoma and other disorders of the eye. Is the patient up to date with their annual eye exam?  Yes  Who is the provider or what is the name of the office in which the patient attends annual eye exams? [facility] If pt is not established with a provider, would they like to be referred to a provider to establish care? No .   Dental Screening: Recommended annual dental exams for proper oral hygiene    Community Resource Referral / Chronic Care Management:  CRR required this visit?  No   CCM required this visit?  No     Plan:     I have personally reviewed and noted the following in the patient's chart:   Medical and social history Use of alcohol, tobacco or illicit drugs  Current medications and supplements including opioid prescriptions. Patient is not currently taking opioid prescriptions. Functional ability and status Nutritional status Physical activity Advanced directives List of other physicians Hospitalizations, surgeries, and ER visits in previous 12 months Vitals Screenings to include cognitive, depression, and falls Referrals and appointments  In addition, I have reviewed and discussed with patient certain preventive protocols, quality metrics, and best practice recommendations. A written personalized care plan for preventive services as well as general preventive health recommendations were provided to patient.     Tillie Rung, LPN   [id number]   After Visit Summary: (MyChart) Due to this being a telephonic visit, the after visit summary with patients personalized plan was offered to patient via MyChart   Nurse Notes: None

## 2023-06-13 ENCOUNTER — Other Ambulatory Visit: Payer: Self-pay | Admitting: Family Medicine

## 2023-06-13 DIAGNOSIS — K219 Gastro-esophageal reflux disease without esophagitis: Secondary | ICD-10-CM

## 2023-06-14 ENCOUNTER — Other Ambulatory Visit: Payer: Self-pay

## 2023-06-14 DIAGNOSIS — K219 Gastro-esophageal reflux disease without esophagitis: Secondary | ICD-10-CM

## 2023-06-14 DIAGNOSIS — M810 Age-related osteoporosis without current pathological fracture: Secondary | ICD-10-CM | POA: Diagnosis not present

## 2023-06-14 MED ORDER — OMEPRAZOLE 40 MG PO CPDR: 40.0000 mg | DELAYED_RELEASE_CAPSULE | Freq: Every day | ORAL | 0 refills | Status: DC

## 2023-06-24 NOTE — Progress Notes (Signed)
Name: [name]   MRN: [id number]    DOB: 1941-08-26   Date:06/25/2023       Progress Note  Subjective  Chief Complaint  Follow Up  HPI  Depression Major Chronic: she still misses her son , that died at age 83 from lymphoma back in 07/19/13.( Her husband died in Jul 19, 2009  on the same day on October 14 th). She has two grandchildren from him and two other grandchildren from her youngest son. She states she really struggles from October until the end of December. Phq 9  was 10, we tried Celexa 10 mg May 2023  and she states feeling better but still had lack of motivation, we added wellbutrin in July  2023 and she noticed improvement of motivation, we increased celexa to 20 mg in September due to October to Dec being her worse months. Discussed going down on dose during Spring and Summer but she wants to stay on current dose.   GERD: she was having dysphagia and choking, but seen by Dr. Allegra Lai and EGD was normal except for mild hiatal hernia, she is taking PPI, does not want to have further testing at this time ( Dr. Allegra Lai advised manometry) , she notices recurrence of symptoms when she skips medication. Weight is stable   Gait instability: doing better since she had PT and is still doing home exercises , she is still uses her hands to assist when going down a step or stairs.  She has OA on both knees, she had knee replacement on right side , she needs to have left knee replacement but not ready yet. She fell at work in May - she tripped - but went to Urgent care at the time.   Dyslipidemia: LDL was down to 79, she is on Pravastatin , we will recheck labs   Senile purpura: both arms, stable and reassurance given again   Osteoporosis: she is under the care of Dr. Tedd Sias and will have another Reclast infusion .  Last visit with Dr. Tedd Sias was July 2024 , bone density improved on spine but worse on hip, she advised her to continue on Reclast and follow up in 6 months   Back pain: she developed acute  onset of low back pain in Dec 23  it radiated to thoracic spine and around her ribs, the pain was intense, she had a visit with Dr. Caralee Ates and was given zanaflex but pain resolved and is not taking medications anymore , she only takes Celebrex prn now   Patient Active Problem List   Diagnosis Date Noted   Multiple thyroid nodules 04/09/2023   Esophageal dysphagia 01/05/2023   Hiatal hernia 01/05/2023   Menopausal osteoporosis 04/08/2022   B12 deficiency 04/08/2022   History of total left hip replacement 04/08/2022   Gait instability 04/08/2022   Impaired fasting glucose    Overweight (BMI 25.0-29.9) 04/03/2020   S/P right TKA 04/02/2020   Status post total knee replacement, right 04/02/2020   Presence of right artificial knee joint 04/02/2020   Senile purpura (HCC) 12/30/2018   S/P vaginal hysterectomy 05/30/2018   Major depression, recurrent, chronic (HCC) 03/02/2018   Gastroesophageal reflux disease without esophagitis 10/20/2016   Anxiety, generalized 01/28/2016   Primary osteoarthritis of right knee 12/25/2015   Abnormal brain CT 06/20/2015   Dyslipidemia 06/20/2015   Vertigo 06/20/2015   History of hypertension 06/20/2015   Arthralgia of shoulder 06/20/2015   Glaucoma 06/20/2015   Post-menopausal 06/04/2015    Past Surgical History:  Procedure Laterality Date   ABDOMINAL HYSTERECTOMY     ANAL FISSURE REPAIR  1970s   for bleeding in 1970s   ANTERIOR AND POSTERIOR REPAIR WITH SACROSPINOUS FIXATION N/A 05/30/2018   Procedure: ANTERIOR AND POSTERIOR REPAIR;  Surgeon: Candice Camp, MD;  Location: [facility];  Service: Gynecology;  Laterality: N/A;   bilateral cataracts removed Bilateral    COLONOSCOPY  2012   [facility]   COLONOSCOPY WITH PROPOFOL N/A 09/01/2016   Procedure: COLONOSCOPY WITH PROPOFOL;  Surgeon: Earline Mayotte, MD;  Location: [facility] END[facility];  Service: Endoscopy;  Laterality: N/A;   COLONOSCOPY WITH PROPOFOL N/A 09/10/2021   Procedure: COLONOSCOPY WITH PROPOFOL;   Surgeon: Earline Mayotte, MD;  Location: [facility];  Service: Endoscopy;  Laterality: N/A;   ESOPHAGOGASTRODUODENOSCOPY (EGD) WITH PROPOFOL N/A 01/05/2023   Procedure: ESOPHAGOGASTRODUODENOSCOPY (EGD) WITH PROPOFOL;  Surgeon: Toney Reil, MD;  Location: [facility];  Service: Gastroenterology;  Laterality: N/A;   EYE SURGERY Bilateral    cataracts removed   KNEE SURGERY Left    SALPINGOOPHORECTOMY Right 05/30/2018   Procedure: SALPINGO OOPHORECTOMY;  Surgeon: Candice Camp, MD;  Location: [facility];  Service: Gynecology;  Laterality: Right;   SUBCLAVIAN ANGIOGRAM     TOTAL HIP ARTHROPLASTY Left 01/28/2021   Procedure: TOTAL HIP ARTHROPLASTY ANTERIOR APPROACH;  Surgeon: Kennedy Bucker, MD;  Location: [facility];  Service: Orthopedics;  Laterality: Left;   TOTAL KNEE ARTHROPLASTY Right 04/02/2020   Procedure: TOTAL KNEE ARTHROPLASTY;  Surgeon: Durene Romans, MD;  Location: [facility];  Service: Orthopedics;  Laterality: Right;  70 mins   UPPER GI ENDOSCOPY  2012   VAGINAL HYSTERECTOMY N/A 05/30/2018   Procedure: HYSTERECTOMY VAGINAL;  Surgeon: Candice Camp, MD;  Location: [facility];  Service: Gynecology;  Laterality: N/A;   WISDOM TOOTH EXTRACTION      Family History  Problem Relation Age of Onset   Cancer Mother 14       colon   Alzheimer's disease Mother    Hypertension Mother    Prostate cancer Father    Hypertension Father    Cerebral aneurysm Father    Diabetes Sister    Depression Sister    Hypertension Sister    Depression Sister    Depression Brother    Cancer Brother        prostate   Cancer Brother    Gallbladder disease Brother    Prostate cancer Brother    Lymphoma Son    Cancer Son 48       lymphoma   Breast cancer Neg Hx     Social History   Tobacco Use   Smoking status: Never   Smokeless tobacco: Never   Tobacco comments:    smoking cessation materials not required  Substance Use Topics   Alcohol use: Yes    Alcohol/week: 0.0 standard drinks of  alcohol    Comment: occasional beer/mixed drink     Current Outpatient Medications:    acetaminophen (TYLENOL) 500 MG tablet, Take 500 mg by mouth every 6 (six) hours as needed., Disp: , Rfl:    ALPHAGAN P 0.1 % SOLN, Place 1 drop into both eyes 3 (three) times daily. , Disp: , Rfl:    bimatoprost (LUMIGAN) 0.01 % SOLN, Place 1 drop into both eyes at bedtime., Disp: , Rfl:    buPROPion (WELLBUTRIN XL) 150 MG 24 hr tablet, TAKE 1 TABLET (150 MG TOTAL) BY MOUTH IN THE MORNING, Disp: 90 tablet, Rfl: 0   celecoxib (CELEBREX) 100 MG capsule, TAKE 1  CAPSULE BY MOUTH 2 TIMES DAILY AS NEEDED., Disp: 60 capsule, Rfl: 0   citalopram (CELEXA) 20 MG tablet, TAKE 1 TABLET BY MOUTH EVERY DAY, Disp: 90 tablet, Rfl: 0   fluticasone (FLONASE) 50 MCG/ACT nasal spray, SPRAY 2 SPRAYS INTO EACH NOSTRIL EVERY DAY, Disp: 48 mL, Rfl: 0   latanoprost (XALATAN) 0.005 % ophthalmic solution, 1 drop., Disp: , Rfl:    omeprazole (PRILOSEC) 40 MG capsule, Take 1 capsule (40 mg total) by mouth daily., Disp: 90 capsule, Rfl: 0   oxybutynin (DITROPAN-XL) 5 MG 24 hr tablet, Take 5 mg by mouth daily., Disp: , Rfl:    Polyethyl Glycol-Propyl Glycol 0.4-0.3 % SOLN, Apply to eye. Using Refresh Mega-3, Disp: , Rfl:    pravastatin (PRAVACHOL) 20 MG tablet, Take 1 tablet (20 mg total) by mouth daily., Disp: 90 tablet, Rfl: 1   PREMARIN vaginal cream, Place 1 application vaginally 2 (two) times a week., Disp: , Rfl: 12  Allergies  Allergen Reactions   Biphosphate    Codeine     headaches   Combigan [Brimonidine Tartrate-Timolol]     drowsy   Sulfa Antibiotics Nausea And Vomiting and Nausea Only   Metronidazole Itching and Rash    I personally reviewed active problem list, medication list, allergies, family history, social history, health maintenance with the patient/caregiver today.   ROS  Ten systems reviewed and is negative except as mentioned in HPI    Objective  Vitals:   06/25/23 1355  BP: 128/72  Pulse: 94   Resp: 16  SpO2: 96%  Weight: 146 lb (66.2 kg)  Height: 5\' 2"  (1.575 m)    Body mass index is 26.7 kg/m.  Physical Exam  Constitutional: Patient appears well-developed and well-nourished. No distress.  HEENT: head atraumatic, normocephalic, pupils equal and reactive to light, neck supple Cardiovascular: Normal rate, regular rhythm and normal heart sounds.  No murmur heard. No BLE edema. Pulmonary/Chest: Effort normal and breath sounds normal. No respiratory distress. Abdominal: Soft.  There is no tenderness. Psychiatric: Patient has a normal mood and affect. behavior is normal. Judgment and thought content normal.   PHQ2/9:    06/25/2023    1:54 PM 06/10/2023    1:41 PM 03/24/2023   10:12 AM 12/07/2022    3:25 PM 11/05/2022    8:14 AM  Depression screen PHQ 2/9  Decreased Interest 1 0 1 1 0  Down, Depressed, Hopeless 0 0 0 1 0  PHQ - 2 Score 1 0 1 2 0  Altered sleeping 0  0 0 0  Tired, decreased energy 0  0 1 0  Change in appetite 0  0 0 0  Feeling bad or failure about yourself  0  0 0 0  Trouble concentrating 0  0 1 0  Moving slowly or fidgety/restless 0  0 0 0  Suicidal thoughts 0  0 0 0  PHQ-9 Score 1  1 4  0  Difficult doing work/chores    Not difficult at all Not difficult at all    phq 9 is positive   Fall Risk:    06/25/2023    1:54 PM 06/10/2023    1:44 PM 03/24/2023   10:12 AM 12/07/2022    3:25 PM 11/05/2022    8:14 AM  Fall Risk   Falls in the past year? 1 1 0 0 0  Number falls in past yr: 0 0   0  Injury with Fall? 0 0   0  Comment  Followed by  medical attention     Risk for fall due to : No Fall Risks No Fall Risks No Fall Risks No Fall Risks   Follow up Falls prevention discussed Falls prevention discussed Falls prevention discussed Falls prevention discussed;Education provided;Falls evaluation completed       Functional Status Survey: Is the patient deaf or have difficulty hearing?: No Does the patient have difficulty seeing, even when wearing  glasses/contacts?: No Does the patient have difficulty concentrating, remembering, or making decisions?: No Does the patient have difficulty walking or climbing stairs?: No Does the patient have difficulty dressing or bathing?: No Does the patient have difficulty doing errands alone such as visiting a doctor's office or shopping?: No    Assessment & Plan  1. Senile purpura (HCC)  Reassurance given   2. GERD without esophagitis  - omeprazole (PRILOSEC) 40 MG capsule; Take 1 capsule (40 mg total) by mouth daily.  Dispense: 90 capsule; Refill: 1  3. Dyslipidemia  - Lipid panel - pravastatin (PRAVACHOL) 20 MG tablet; Take 1 tablet (20 mg total) by mouth daily.  Dispense: 90 tablet; Refill: 2  4. B12 deficiency  - B12 and Folate Panel  5. Age-related osteoporosis without current pathological fracture  Under the care of Dr. Tedd Sias   6. Major depression, recurrent, chronic (HCC)  - buPROPion (WELLBUTRIN XL) 150 MG 24 hr tablet; Take 1 tablet (150 mg total) by mouth in the morning.  Dispense: 90 tablet; Refill: 1 - citalopram (CELEXA) 20 MG tablet; Take 1 tablet (20 mg total) by mouth daily.  Dispense: 90 tablet; Refill: 1  7. Chronic midline low back pain without sciatica  Stable   8. Primary osteoarthritis of both knees  - celecoxib (CELEBREX) 100 MG capsule; Take 1 capsule (100 mg total) by mouth 2 (two) times daily.  Dispense: 180 capsule; Refill: 0  9. Nasal congestion  - fluticasone (FLONASE) 50 MCG/ACT nasal spray; Place 2 sprays into both nostrils daily.  Dispense: 48 mL; Refill: 0  10. Long-term use of high-risk medication  - COMPLETE METABOLIC PANEL WITH GFR - CBC with Differential/Platelet  11. Osteoporosis, post-menopausal  - Cholecalciferol (VITAMIN D3) 50 MCG (2000 UT) capsule; Take 1 capsule (2,000 Units total) by mouth daily.  Dispense: 100 capsule; Refill: 1

## 2023-06-25 ENCOUNTER — Ambulatory Visit (INDEPENDENT_AMBULATORY_CARE_PROVIDER_SITE_OTHER): Payer: Medicare HMO | Admitting: Family Medicine

## 2023-06-25 ENCOUNTER — Encounter: Payer: Self-pay | Admitting: Family Medicine

## 2023-06-25 VITALS — BP 128/72 | HR 94 | Resp 16 | Ht 62.0 in | Wt 146.0 lb

## 2023-06-25 DIAGNOSIS — D692 Other nonthrombocytopenic purpura: Secondary | ICD-10-CM | POA: Diagnosis not present

## 2023-06-25 DIAGNOSIS — R0981 Nasal congestion: Secondary | ICD-10-CM | POA: Diagnosis not present

## 2023-06-25 DIAGNOSIS — G8929 Other chronic pain: Secondary | ICD-10-CM | POA: Diagnosis not present

## 2023-06-25 DIAGNOSIS — F339 Major depressive disorder, recurrent, unspecified: Secondary | ICD-10-CM | POA: Diagnosis not present

## 2023-06-25 DIAGNOSIS — M170 Bilateral primary osteoarthritis of knee: Secondary | ICD-10-CM

## 2023-06-25 DIAGNOSIS — E538 Deficiency of other specified B group vitamins: Secondary | ICD-10-CM

## 2023-06-25 DIAGNOSIS — E785 Hyperlipidemia, unspecified: Secondary | ICD-10-CM | POA: Diagnosis not present

## 2023-06-25 DIAGNOSIS — Z79899 Other long term (current) drug therapy: Secondary | ICD-10-CM | POA: Diagnosis not present

## 2023-06-25 DIAGNOSIS — M810 Age-related osteoporosis without current pathological fracture: Secondary | ICD-10-CM

## 2023-06-25 DIAGNOSIS — K219 Gastro-esophageal reflux disease without esophagitis: Secondary | ICD-10-CM | POA: Diagnosis not present

## 2023-06-25 DIAGNOSIS — M545 Low back pain, unspecified: Secondary | ICD-10-CM | POA: Diagnosis not present

## 2023-06-25 LAB — CBC WITH DIFFERENTIAL/PLATELET
Absolute Monocytes: 384 cells/uL (ref 200–950)
Basophils Absolute: 18 cells/uL (ref 0–200)
Basophils Relative: 0.3 %
Eosinophils Absolute: 98 cells/uL (ref 15–500)
Eosinophils Relative: 1.6 %
HCT: 36.8 % (ref 35.0–45.0)
Hemoglobin: 11.9 g/dL (ref 11.7–15.5)
Lymphs Abs: 1696 cells/uL (ref 850–3900)
MCH: 28.1 pg (ref 27.0–33.0)
MCHC: 32.3 g/dL (ref 32.0–36.0)
MCV: 86.8 fL (ref 80.0–100.0)
MPV: 10.6 fL (ref 7.5–12.5)
Monocytes Relative: 6.3 %
Neutro Abs: 3904 cells/uL (ref 1500–7800)
Neutrophils Relative %: 64 %
Platelets: 217 10*3/uL (ref 140–400)
RBC: 4.24 10*6/uL (ref 3.80–5.10)
RDW: 13.8 % (ref 11.0–15.0)
Total Lymphocyte: 27.8 %
WBC: 6.1 10*3/uL (ref 3.8–10.8)

## 2023-06-25 MED ORDER — CELECOXIB 100 MG PO CAPS: 100.0000 mg | ORAL_CAPSULE | Freq: Two times a day (BID) | ORAL | 0 refills | Status: DC

## 2023-06-25 MED ORDER — VITAMIN D3 50 MCG (2000 UT) PO CAPS: 2000.0000 [IU] | ORAL_CAPSULE | Freq: Every day | ORAL | 1 refills | Status: AC

## 2023-06-25 MED ORDER — BUPROPION HCL ER (XL) 150 MG PO TB24: 150.0000 mg | ORAL_TABLET | Freq: Every morning | ORAL | 1 refills | Status: DC

## 2023-06-25 MED ORDER — PRAVASTATIN SODIUM 20 MG PO TABS: 20.0000 mg | ORAL_TABLET | Freq: Every day | ORAL | 2 refills | Status: DC

## 2023-06-25 MED ORDER — OMEPRAZOLE 40 MG PO CPDR: 40.0000 mg | DELAYED_RELEASE_CAPSULE | Freq: Every day | ORAL | 1 refills | Status: DC

## 2023-06-25 MED ORDER — CITALOPRAM HYDROBROMIDE 20 MG PO TABS: 20.0000 mg | ORAL_TABLET | Freq: Every day | ORAL | 1 refills | Status: DC

## 2023-06-25 MED ORDER — FLUTICASONE PROPIONATE 50 MCG/ACT NA SUSP: 2.0000 | Freq: Every day | NASAL | 0 refills | Status: DC

## 2023-07-10 DIAGNOSIS — Z8249 Family history of ischemic heart disease and other diseases of the circulatory system: Secondary | ICD-10-CM | POA: Diagnosis not present

## 2023-07-10 DIAGNOSIS — M1990 Unspecified osteoarthritis, unspecified site: Secondary | ICD-10-CM | POA: Diagnosis not present

## 2023-07-10 DIAGNOSIS — Z809 Family history of malignant neoplasm, unspecified: Secondary | ICD-10-CM | POA: Diagnosis not present

## 2023-07-10 DIAGNOSIS — N952 Postmenopausal atrophic vaginitis: Secondary | ICD-10-CM | POA: Diagnosis not present

## 2023-07-10 DIAGNOSIS — K219 Gastro-esophageal reflux disease without esophagitis: Secondary | ICD-10-CM | POA: Diagnosis not present

## 2023-07-10 DIAGNOSIS — M4800 Spinal stenosis, site unspecified: Secondary | ICD-10-CM | POA: Diagnosis not present

## 2023-07-10 DIAGNOSIS — Z008 Encounter for other general examination: Secondary | ICD-10-CM | POA: Diagnosis not present

## 2023-07-10 DIAGNOSIS — I10 Essential (primary) hypertension: Secondary | ICD-10-CM | POA: Diagnosis not present

## 2023-07-10 DIAGNOSIS — M545 Low back pain, unspecified: Secondary | ICD-10-CM | POA: Diagnosis not present

## 2023-07-10 DIAGNOSIS — M810 Age-related osteoporosis without current pathological fracture: Secondary | ICD-10-CM | POA: Diagnosis not present

## 2023-07-10 DIAGNOSIS — H409 Unspecified glaucoma: Secondary | ICD-10-CM | POA: Diagnosis not present

## 2023-07-10 DIAGNOSIS — F325 Major depressive disorder, single episode, in full remission: Secondary | ICD-10-CM | POA: Diagnosis not present

## 2023-07-10 DIAGNOSIS — E785 Hyperlipidemia, unspecified: Secondary | ICD-10-CM | POA: Diagnosis not present

## 2023-07-13 DIAGNOSIS — Z0100 Encounter for examination of eyes and vision without abnormal findings: Secondary | ICD-10-CM | POA: Diagnosis not present

## 2023-08-26 ENCOUNTER — Other Ambulatory Visit: Payer: Self-pay

## 2023-08-26 ENCOUNTER — Ambulatory Visit: Payer: Medicare HMO | Admitting: Nurse Practitioner

## 2023-08-26 ENCOUNTER — Encounter: Payer: Self-pay | Admitting: Nurse Practitioner

## 2023-08-26 VITALS — BP 120/84 | HR 98 | Temp 98.0°F | Ht 62.0 in | Wt 146.4 lb

## 2023-08-26 DIAGNOSIS — J069 Acute upper respiratory infection, unspecified: Secondary | ICD-10-CM | POA: Diagnosis not present

## 2023-08-26 LAB — POCT INFLUENZA A/B
Influenza A, POC: NEGATIVE
Influenza B, POC: NEGATIVE

## 2023-08-26 LAB — POCT RAPID STREP A (OFFICE): Rapid Strep A Screen: NEGATIVE

## 2023-08-26 MED ORDER — BENZONATATE 100 MG PO CAPS: 200.0000 mg | ORAL_CAPSULE | Freq: Two times a day (BID) | ORAL | 0 refills | Status: DC | PRN

## 2023-08-26 MED ORDER — PREDNISONE 10 MG (21) PO TBPK: ORAL_TABLET | ORAL | 0 refills | Status: DC

## 2023-08-26 NOTE — Progress Notes (Signed)
   BP 120/84   Pulse 98   Temp 98 F (36.7 C) (Oral)   Ht 5\' 2"  (1.575 m)   Wt 146 lb 6.4 oz (66.4 kg)   SpO2 99%   BMI 26.78 kg/m    Subjective:    [name], female    DOB: 10-Mar-1941, 82 y.o.   MRN: [id number]  HPI: [name] is a 82 y.o. female  Chief Complaint  Patient presents with   Cough    Congested, headache for 6 days. Covid test negative   URI Compliant: symptoms started Saturday -Fever: no -Cough: yes -Shortness of breath: no -Wheezing: no -Chest congestion: yes -Nasal congestion: yes -Runny nose: no -Post nasal drip: no -Sore throat: yes -Sinus pressure: yes -Headache: yes -Face pain: no -Ear pain:  no -Ear pressure: no -Relief with OTC cold/cough medications: tylenol  Recommend taking zyrtec, flonase, mucinex, vitamin d, vitamin c, and zinc. Push fluids and get rest.    Will get rapid strep, flu and covid pcr. Flu and strep negative.   Will start with prednisone taper and tessalon perls.   Relevant past medical, surgical, family and social history reviewed and updated as indicated. Interim medical history since our last visit reviewed. Allergies and medications reviewed and updated.  Review of Systems  Ten systems reviewed and is negative except as mentioned in HPI       Objective:    BP 120/84   Pulse 98   Temp 98 F (36.7 C) (Oral)   Ht 5\' 2"  (1.575 m)   Wt 146 lb 6.4 oz (66.4 kg)   SpO2 99%   BMI 26.78 kg/m   Wt Readings from Last 3 Encounters:  08/26/23 146 lb 6.4 oz (66.4 kg)  06/25/23 146 lb (66.2 kg)  06/10/23 142 lb (64.4 kg)    Physical Exam  Constitutional: Patient appears well-developed and well-nourished. No distress.  HEENT: head atraumatic, normocephalic, pupils equal and reactive to light, ears Tms clear, neck supple, throat within normal limits, no lymphadenopathy. Cardiovascular: Normal rate, regular rhythm and normal heart sounds.  No murmur heard. No BLE edema. Pulmonary/Chest:  Effort normal and breath sounds normal. No respiratory distress. Abdominal: Soft.  There is no tenderness. Psychiatric: Patient has a normal mood and affect. behavior is normal. Judgment and thought content normal.  Results for orders placed or performed in visit on 08/26/23  POCT Influenza A/B  Result Value Ref Range   Influenza A, POC Negative Negative   Influenza B, POC Negative Negative  POCT rapid strep A  Result Value Ref Range   Rapid Strep A Screen Negative Negative      Assessment & Plan:   Problem List Items Addressed This Visit   None Visit Diagnoses     Viral upper respiratory tract infection    -  Primary   Recommend taking zyrtec, flonase, mucinex, vitamin d, vitamin c, and zinc. Push fluids and get rest.    .  Will start with prednisone taper and tessalon perls.   Relevant Medications   benzonatate (TESSALON) 100 MG capsule   predniSONE (STERAPRED UNI-PAK 21 TAB) 10 MG (21) TBPK tablet   Other Relevant Orders   POCT Influenza A/B (Completed)   POCT rapid strep A (Completed)   Novel Coronavirus, NAA ([facility])        Follow up plan: Return if symptoms worsen or fail to improve.

## 2023-08-28 LAB — NOVEL CORONAVIRUS, NAA: SARS-CoV-2, NAA: NOT DETECTED

## 2023-09-23 ENCOUNTER — Other Ambulatory Visit: Payer: Self-pay

## 2023-09-23 NOTE — Patient Outreach (Signed)
  Care Management   Visit Note  09/23/2023 Name: BRALYN ESPINO MRN: [id number] DOB: 07/20/1941  Subjective: Niasia Lanphear Golembeski is a 82 y.o. year old female who is a primary care patient of Alba Cory, MD. The Care Management team was consulted for assistance.      Brief outreach with Ms. Stierwalt regarding care management needs. Unable to complete outreach today. Anticipates returning to town next week. Will tentatively schedule outreach for 10/01/23.   PLAN Will follow up when Ms. Winget returns next week.   [facility]ina [facility]  [facility] RN Care Manager Direct Dial: [phone] Website: [email or web address]

## 2023-10-01 ENCOUNTER — Other Ambulatory Visit: Payer: Medicare HMO

## 2023-10-04 NOTE — Patient Outreach (Signed)
  Care Management   Visit Note   Name: LEIGHANN FLETES MRN: [id number] DOB: [birth year]  Subjective: [name] is a 82 y.o. year old female who is a primary care patient of Alba Cory, MD. The Care Management team was consulted for assistance.      Engaged with patient via telephone  Assessment:  Outpatient Encounter Medications as of 10/01/2023  Medication Sig Note   ALPHAGAN P 0.1 % SOLN Place 1 drop into both eyes 3 (three) times daily.     benzonatate (TESSALON) 100 MG capsule Take 2 capsules (200 mg total) by mouth 2 (two) times daily as needed for cough.    bimatoprost (LUMIGAN) 0.01 % SOLN Place 1 drop into both eyes at bedtime.    buPROPion (WELLBUTRIN XL) 150 MG 24 hr tablet Take 1 tablet (150 mg total) by mouth in the morning.    celecoxib (CELEBREX) 100 MG capsule Take 1 capsule (100 mg total) by mouth 2 (two) times daily.    Cholecalciferol (VITAMIN D3) 50 MCG (2000 UT) capsule Take 1 capsule (2,000 Units total) by mouth daily.    citalopram (CELEXA) 20 MG tablet Take 1 tablet (20 mg total) by mouth daily.    fluticasone (FLONASE) 50 MCG/ACT nasal spray Place 2 sprays into both nostrils daily.    latanoprost (XALATAN) 0.005 % ophthalmic solution 1 drop.    Polyethyl Glycol-Propyl Glycol 0.4-0.3 % SOLN Apply to eye. Using Refresh Mega-3    pravastatin (PRAVACHOL) 20 MG tablet Take 1 tablet (20 mg total) by mouth daily.    acetaminophen (TYLENOL) 500 MG tablet Take 500 mg by mouth every 6 (six) hours as needed.    omeprazole (PRILOSEC) 40 MG capsule Take 1 capsule (40 mg total) by mouth daily.    oxybutynin (DITROPAN-XL) 5 MG 24 hr tablet Take 5 mg by mouth daily. (Patient not taking: Reported on 10/01/2023) 10/01/2023: Reports not taking   predniSONE (STERAPRED UNI-PAK 21 TAB) 10 MG (21) TBPK tablet Take as directed on package.  (60 mg po on day 1, 50 mg po on day 2...)    PREMARIN vaginal cream Place 1 application vaginally 2 (two) times a week.    No  facility-administered encounter medications on file as of 10/01/2023.

## 2023-10-11 DIAGNOSIS — H401132 Primary open-angle glaucoma, bilateral, moderate stage: Secondary | ICD-10-CM | POA: Diagnosis not present

## 2023-12-07 ENCOUNTER — Other Ambulatory Visit: Payer: Self-pay | Admitting: Family Medicine

## 2023-12-07 DIAGNOSIS — F339 Major depressive disorder, recurrent, unspecified: Secondary | ICD-10-CM

## 2023-12-08 NOTE — Telephone Encounter (Signed)
Requested Prescriptions  Pending Prescriptions Disp Refills   citalopram (CELEXA) 20 MG tablet [Pharmacy Med Name: CITALOPRAM HBR 20 MG TABLET] 90 tablet 1    Sig: TAKE 1 TABLET BY MOUTH EVERY DAY     Psychiatry:  Antidepressants - SSRI Passed - 12/08/2023  7:49 AM      Passed - Completed PHQ-2 or PHQ-9 in the last 360 days      Passed - Valid encounter within last 6 months    Recent Outpatient Visits           3 months ago Viral upper respiratory tract infection   [facility] Berniece Salines, FNP   5 months ago Senile purpura [facility])   [facility] Alba Cory, MD   8 months ago B12 deficiency   [facility] Alba Cory, MD   1 year ago Thoracic spine pain   [facility] Alba Cory, MD   1 year ago Acute thoracic myofascial strain, initial encounter   [facility] Margarita Mail, DO       Future Appointments             In 1 month Carlynn Purl, Danna Hefty, MD [facility]

## 2023-12-09 ENCOUNTER — Other Ambulatory Visit: Payer: Self-pay | Admitting: Family Medicine

## 2023-12-09 DIAGNOSIS — R0981 Nasal congestion: Secondary | ICD-10-CM

## 2023-12-13 ENCOUNTER — Other Ambulatory Visit: Payer: Self-pay | Admitting: Family Medicine

## 2023-12-13 DIAGNOSIS — K219 Gastro-esophageal reflux disease without esophagitis: Secondary | ICD-10-CM

## 2023-12-13 DIAGNOSIS — F339 Major depressive disorder, recurrent, unspecified: Secondary | ICD-10-CM

## 2023-12-20 DIAGNOSIS — M810 Age-related osteoporosis without current pathological fracture: Secondary | ICD-10-CM | POA: Diagnosis not present

## 2023-12-24 DIAGNOSIS — M1712 Unilateral primary osteoarthritis, left knee: Secondary | ICD-10-CM | POA: Diagnosis not present

## 2023-12-27 DIAGNOSIS — M810 Age-related osteoporosis without current pathological fracture: Secondary | ICD-10-CM | POA: Diagnosis not present

## 2023-12-28 ENCOUNTER — Ambulatory Visit (INDEPENDENT_AMBULATORY_CARE_PROVIDER_SITE_OTHER): Payer: Medicare HMO | Admitting: Family Medicine

## 2023-12-28 ENCOUNTER — Encounter: Payer: Self-pay | Admitting: Family Medicine

## 2023-12-28 VITALS — BP 102/64 | HR 89 | Temp 98.2°F | Resp 16 | Ht 62.0 in | Wt 146.3 lb

## 2023-12-28 DIAGNOSIS — Z01818 Encounter for other preprocedural examination: Secondary | ICD-10-CM | POA: Diagnosis not present

## 2023-12-28 DIAGNOSIS — M1712 Unilateral primary osteoarthritis, left knee: Secondary | ICD-10-CM | POA: Diagnosis not present

## 2023-12-28 NOTE — Progress Notes (Addendum)
COVID Vaccine Completed:  Yes  Date of COVID positive in last 90 days:  PCP - Alba Cory, MD Cardiologist -  Julien Nordmann, MD [phone] for chest pain)  Medical clearance in Epic dated 12-28-23  Chest x-ray -  EKG - 12-28-23 Stress Test -  ECHO -  Cardiac Cath -  Pacemaker/ICD device last checked: Spinal Cord Stimulator:  Bowel Prep -   Sleep Study -  CPAP -   Fasting Blood Sugar -  Checks Blood Sugar _____ times a day  Last dose of GLP1 agonist-  N/A GLP1 instructions:  Hold 7 days before surgery    Last dose of SGLT-2 inhibitors-  N/A SGLT-2 instructions:  Hold 3 days before surgery    Blood Thinner Instructions:  Last dose:   Time: Aspirin Instructions: Last Dose:  Activity level:  Can go up a flight of stairs and perform activities of daily living without stopping and without symptoms of chest pain or shortness of breath.  Able to exercise without symptoms  Unable to go up a flight of stairs without symptoms of     Anesthesia review:   Patient denies shortness of breath, fever, cough and chest pain at PAT appointment  Patient verbalized understanding of instructions that were given to them at the PAT appointment. Patient was also instructed that they will need to review over the PAT instructions again at home before surgery.

## 2023-12-28 NOTE — Patient Instructions (Signed)
SURGICAL WAITING ROOM VISITATION Patients having surgery or a procedure may have no more than 2 support people in the waiting area - these visitors may rotate.    Children under the age of 36 must have an adult with them who is not the patient.  Due to an increase in RSV and influenza rates and associated hospitalizations, children ages 50 and under may not visit patients in [facility] hospitals.   If the patient needs to stay at the hospital during part of their recovery, the visitor guidelines for inpatient rooms apply. Pre-op nurse will coordinate an appropriate time for 1 support person to accompany patient in pre-op.  This support person may not rotate.    Please refer to the [facility] website for the visitor guidelines for Inpatients (after your surgery is over and you are in a regular room).       Your procedure is scheduled on: 01-11-24   Report to [facility] Main Entrance    Report to admitting at 1:50 PM   Call this number if you have problems the morning of surgery [phone]   Do not eat food :After Midnight.   After Midnight you may have the following liquids until 1:20 PM/ PM DAY OF SURGERY  Water Non-Citrus Juices (without pulp, NO RED-Apple, White grape, White cranberry) Black Coffee (NO MILK/CREAM OR CREAMERS, sugar ok)  Clear Tea (NO MILK/CREAM OR CREAMERS, sugar ok) regular and decaf                             Plain Jell-O (NO RED)                                           Fruit ices (not with fruit pulp, NO RED)                                     Popsicles (NO RED)                                                               Sports drinks like Gatorade (NO RED)                   The day of surgery:  Drink ONE (1) Pre-Surgery Clear Ensure or G2 by 1:20 PM the morning of surgery. Drink in one sitting. Do not sip.  This drink was given to you during your hospital  pre-op appointment visit. Nothing else to drink after completing the  Pre-Surgery Clear Ensure or G2.          If you have questions, please contact your surgeon's office.   FOLLOW  ANY ADDITIONAL PRE OP INSTRUCTIONS YOU RECEIVED FROM YOUR SURGEON'S OFFICE!!!     Oral Hygiene is also important to reduce your risk of infection.                                    Remember - BRUSH YOUR TEETH THE MORNING OF SURGERY  WITH YOUR REGULAR TOOTHPASTE   Do NOT smoke after Midnight   Take these medicines the morning of surgery with A SIP OF WATER:    Bupropion   Citalopram   Omeprazole   Pravastatin   Flonase nasal spray   Okay to use eyedrops  Stop all vitamins and herbal supplements 7 days before surgery  Bring CPAP mask and tubing day of surgery.                              You may not have any metal on your body including hair pins, jewelry, and body piercing             Do not wear make-up, lotions, powders, perfumes or deodorant  Do not wear nail polish including gel and S&S, artificial/acrylic nails, or any other type of covering on natural nails including finger and toenails. If you have artificial nails, gel coating, etc. that needs to be removed by a nail salon please have this removed prior to surgery or surgery may need to be canceled/ delayed if the surgeon/ anesthesia feels like they are unable to be safely monitored.   Do not shave  48 hours prior to surgery.    Do not bring valuables to the hospital. [place] IS NOT RESPONSIBLE   FOR VALUABLES.   Contacts, dentures or bridgework may not be worn into surgery.   Bring small overnight bag day of surgery.   DO NOT BRING YOUR HOME MEDICATIONS TO THE HOSPITAL. PHARMACY WILL DISPENSE MEDICATIONS LISTED ON YOUR MEDICATION LIST TO YOU DURING YOUR ADMISSION IN THE HOSPITAL!   Special Instructions: Bring a copy of your healthcare power of attorney and living will documents the day of surgery if you haven't scanned them before.              Please read over the following fact sheets you were  given: IF YOU HAVE QUESTIONS ABOUT YOUR PRE-OP INSTRUCTIONS PLEASE CALL [phone] [facility]  If you received a COVID test during your pre-op visit  it is requested that you wear a mask when out in public, stay away from anyone that may not be feeling well and notify your surgeon if you develop symptoms. If you test positive for Covid or have been in contact with anyone that has tested positive in the last 10 days please notify you surgeon.    Pre-operative 5 CHG Bath Instructions   You can play a key role in reducing the risk of infection after surgery. Your skin needs to be as free of germs as possible. You can reduce the number of germs on your skin by washing with CHG (chlorhexidine gluconate) soap before surgery. CHG is an antiseptic soap that kills germs and continues to kill germs even after washing.   DO NOT use if you have an allergy to chlorhexidine/CHG or antibacterial soaps. If your skin becomes reddened or irritated, stop using the CHG and notify one of our RNs at [phone].   Please shower with the CHG soap starting 4 days before surgery using the following schedule:     Please keep in mind the following:  DO NOT shave, including legs and underarms, starting the day of your first shower.   You may shave your face at any point before/day of surgery.  Place clean sheets on your bed the day you start using CHG soap. Use a clean washcloth (not used since being washed) for each shower.  DO NOT sleep with pets once you start using the CHG.   CHG Shower Instructions:  If you choose to wash your hair and private area, wash first with your normal shampoo/soap.  After you use shampoo/soap, rinse your hair and body thoroughly to remove shampoo/soap residue.  Turn the water OFF and apply about 3 tablespoons (45 ml) of CHG soap to a CLEAN washcloth.  Apply CHG soap ONLY FROM YOUR NECK DOWN TO YOUR TOES (washing for 3-5 minutes)  DO NOT use CHG soap on face, private areas, open wounds, or  sores.  Pay special attention to the area where your surgery is being performed.  If you are having back surgery, having someone wash your back for you may be helpful. Wait 2 minutes after CHG soap is applied, then you may rinse off the CHG soap.  Pat dry with a clean towel  Put on clean clothes/pajamas   If you choose to wear lotion, please use ONLY the CHG-compatible lotions on the back of this paper.     Additional instructions for the day of surgery: DO NOT APPLY any lotions, deodorants, cologne, or perfumes.   Put on clean/comfortable clothes.  Brush your teeth.  Ask your nurse before applying any prescription medications to the skin.      CHG Compatible Lotions   Aveeno Moisturizing lotion  Cetaphil Moisturizing Cream  Cetaphil Moisturizing Lotion  Clairol Herbal Essence Moisturizing Lotion, Dry Skin  Clairol Herbal Essence Moisturizing Lotion, Extra Dry Skin  Clairol Herbal Essence Moisturizing Lotion, Normal Skin  Curel Age Defying Therapeutic Moisturizing Lotion with Alpha Hydroxy  Curel Extreme Care Body Lotion  Curel Soothing Hands Moisturizing Hand Lotion  Curel Therapeutic Moisturizing Cream, Fragrance-Free  Curel Therapeutic Moisturizing Lotion, Fragrance-Free  Curel Therapeutic Moisturizing Lotion, Original Formula  Eucerin Daily Replenishing Lotion  Eucerin Dry Skin Therapy Plus Alpha Hydroxy Crme  Eucerin Dry Skin Therapy Plus Alpha Hydroxy Lotion  Eucerin Original Crme  Eucerin Original Lotion  Eucerin Plus Crme Eucerin Plus Lotion  Eucerin TriLipid Replenishing Lotion  Keri Anti-Bacterial Hand Lotion  Keri Deep Conditioning Original Lotion Dry Skin Formula Softly Scented  Keri Deep Conditioning Original Lotion, Fragrance Free Sensitive Skin Formula  Keri Lotion Fast Absorbing Fragrance Free Sensitive Skin Formula  Keri Lotion Fast Absorbing Softly Scented Dry Skin Formula  Keri Original Lotion  Keri Skin Renewal Lotion Keri Silky Smooth Lotion  Keri  Silky Smooth Sensitive Skin Lotion  Nivea Body Creamy Conditioning Oil  Nivea Body Extra Enriched Lotion  Nivea Body Original Lotion  Nivea Body Sheer Moisturizing Lotion Nivea Crme  Nivea Skin Firming Lotion  NutraDerm 30 Skin Lotion  NutraDerm Skin Lotion  NutraDerm Therapeutic Skin Cream  NutraDerm Therapeutic Skin Lotion  ProShield Protective Hand Cream  Provon moisturizing lotion   PATIENT SIGNATURE_________________________________  NURSE SIGNATURE__________________________________  ________________________________________________________________________    Rogelia Mire  An incentive spirometer is a tool that can help keep your lungs clear and active. This tool measures how well you are filling your lungs with each breath. Taking long deep breaths may help reverse or decrease the chance of developing breathing (pulmonary) problems (especially infection) following: A long period of time when you are unable to move or be active. BEFORE THE PROCEDURE  If the spirometer includes an indicator to show your best effort, your nurse or respiratory therapist will set it to a desired goal. If possible, sit up straight or lean slightly forward. Try not to slouch. Hold the incentive spirometer in an upright position. INSTRUCTIONS  FOR USE  Sit on the edge of your bed if possible, or sit up as far as you can in bed or on a chair. Hold the incentive spirometer in an upright position. Breathe out normally. Place the mouthpiece in your mouth and seal your lips tightly around it. Breathe in slowly and as deeply as possible, raising the piston or the ball toward the top of the column. Hold your breath for 3-5 seconds or for as long as possible. Allow the piston or ball to fall to the bottom of the column. Remove the mouthpiece from your mouth and breathe out normally. Rest for a few seconds and repeat Steps 1 through 7 at least 10 times every 1-2 hours when you are awake. Take your time  and take a few normal breaths between deep breaths. The spirometer may include an indicator to show your best effort. Use the indicator as a goal to work toward during each repetition. After each set of 10 deep breaths, practice coughing to be sure your lungs are clear. If you have an incision (the cut made at the time of surgery), support your incision when coughing by placing a pillow or rolled up towels firmly against it. Once you are able to get out of bed, walk around indoors and cough well. You may stop using the incentive spirometer when instructed by your caregiver.  RISKS AND COMPLICATIONS Take your time so you do not get dizzy or light-headed. If you are in pain, you may need to take or ask for pain medication before doing incentive spirometry. It is harder to take a deep breath if you are having pain. AFTER USE Rest and breathe slowly and easily. It can be helpful to keep track of a log of your progress. Your caregiver can provide you with a simple table to help with this. If you are using the spirometer at home, follow these instructions: SEEK MEDICAL CARE IF:  You are having difficultly using the spirometer. You have trouble using the spirometer as often as instructed. Your pain medication is not giving enough relief while using the spirometer. You develop fever of 100.5 F (38.1 C) or higher. SEEK IMMEDIATE MEDICAL CARE IF:  You cough up bloody sputum that had not been present before. You develop fever of 102 F (38.9 C) or greater. You develop worsening pain at or near the incision site. MAKE SURE YOU:  Understand these instructions. Will watch your condition. Will get help right away if you are not doing well or get worse. Document Released: 03/15/2007 Document Revised: 01/25/2012 Document Reviewed: 05/16/2007 [facility] Patient Information 2014 [place], [place].   ________________________________________________________________________

## 2023-12-28 NOTE — Progress Notes (Signed)
Name: [name]   MRN: [id number]    DOB: 02/09/1941   Date:12/28/2023       Progress Note  Subjective  Chief Complaint  Chief Complaint  Patient presents with   Pre-op Exam   HPI   Dr. Charlann Boxer requested a pre op for Total Knee arthroplasty that will be done at [facility] on 01/11/2024  Patient had right knee arthroplasty in the past and now is ready for the left knee. She has daily pain and also effusion, no redness or increase warm. She will have spinal block. She will go home after the procedure with family to assist for the first two weeks  She has good exercise tolerance. She had cold symptoms that started last week but feeling better now. No fever or SOB. She denies chest pain. Had surgeries within the past few years without any complications.   EKG today showed stable PVC's sinus rhythm   Patient Active Problem List   Diagnosis Date Noted   Multiple thyroid nodules 04/09/2023   Esophageal dysphagia 01/05/2023   Hiatal hernia 01/05/2023   Menopausal osteoporosis 04/08/2022   B12 deficiency 04/08/2022   History of total left hip replacement 04/08/2022   Gait instability 04/08/2022   Impaired fasting glucose    Overweight (BMI 25.0-29.9) 04/03/2020   S/P right TKA 04/02/2020   Status post total knee replacement, right 04/02/2020   Presence of right artificial knee joint 04/02/2020   Senile purpura (HCC) 12/30/2018   S/P vaginal hysterectomy 05/30/2018   Major depression, recurrent, chronic (HCC) 03/02/2018   Gastroesophageal reflux disease without esophagitis 10/20/2016   Anxiety, generalized 01/28/2016   Primary osteoarthritis of right knee 12/25/2015   Abnormal brain CT 06/20/2015   Dyslipidemia 06/20/2015   Vertigo 06/20/2015   History of hypertension 06/20/2015   Arthralgia of shoulder 06/20/2015   Glaucoma 06/20/2015   Post-menopausal 06/04/2015    Past Surgical History:  Procedure Laterality Date   ABDOMINAL HYSTERECTOMY     ANAL FISSURE REPAIR  1970s    for bleeding in 1970s   ANTERIOR AND POSTERIOR REPAIR WITH SACROSPINOUS FIXATION N/A 05/30/2018   Procedure: ANTERIOR AND POSTERIOR REPAIR;  Surgeon: Candice Camp, MD;  Location: [facility];  Service: Gynecology;  Laterality: N/A;   bilateral cataracts removed Bilateral    COLONOSCOPY  2012   [facility]   COLONOSCOPY WITH PROPOFOL N/A 09/01/2016   Procedure: COLONOSCOPY WITH PROPOFOL;  Surgeon: Earline Mayotte, MD;  Location: [facility] ENDOSCOPY;  Service: Endoscopy;  Laterality: N/A;   COLONOSCOPY WITH PROPOFOL N/A 09/10/2021   Procedure: COLONOSCOPY WITH PROPOFOL;  Surgeon: Earline Mayotte, MD;  Location: [facility] ENDOSCOPY;  Service: Endoscopy;  Laterality: N/A;   ESOPHAGOGASTRODUODENOSCOPY (EGD) WITH PROPOFOL N/A 01/05/2023   Procedure: ESOPHAGOGASTRODUODENOSCOPY (EGD) WITH PROPOFOL;  Surgeon: Toney Reil, MD;  Location: [facility] ENDOSCOPY;  Service: Gastroenterology;  Laterality: N/A;   EYE SURGERY Bilateral    cataracts removed   KNEE SURGERY Left    SALPINGOOPHORECTOMY Right 05/30/2018   Procedure: SALPINGO OOPHORECTOMY;  Surgeon: Candice Camp, MD;  Location: [facility];  Service: Gynecology;  Laterality: Right;   SUBCLAVIAN ANGIOGRAM     TOTAL HIP ARTHROPLASTY Left 01/28/2021   Procedure: TOTAL HIP ARTHROPLASTY ANTERIOR APPROACH;  Surgeon: Kennedy Bucker, MD;  Location: [facility] ORS;  Service: Orthopedics;  Laterality: Left;   TOTAL KNEE ARTHROPLASTY Right 04/02/2020   Procedure: TOTAL KNEE ARTHROPLASTY;  Surgeon: Durene Romans, MD;  Location: [facility];  Service: Orthopedics;  Laterality: Right;  70 mins   UPPER GI ENDOSCOPY  2012   VAGINAL HYSTERECTOMY N/A 05/30/2018   Procedure: HYSTERECTOMY VAGINAL;  Surgeon: Candice Camp, MD;  Location: [facility];  Service: Gynecology;  Laterality: N/A;   WISDOM TOOTH EXTRACTION      Family History  Problem Relation Age of Onset   Cancer Mother 28       colon   Alzheimer's disease Mother    Hypertension Mother    Prostate cancer Father    Hypertension Father     Cerebral aneurysm Father    Diabetes Sister    Depression Sister    Hypertension Sister    Depression Sister    Depression Brother    Cancer Brother        prostate   Cancer Brother    Gallbladder disease Brother    Prostate cancer Brother    Lymphoma Son    Cancer Son 48       lymphoma   Breast cancer Neg Hx     Social History   Tobacco Use   Smoking status: Never   Smokeless tobacco: Never   Tobacco comments:    smoking cessation materials not required  Substance Use Topics   Alcohol use: Yes    Alcohol/week: 0.0 standard drinks of alcohol    Comment: occasional beer/mixed drink     Current Outpatient Medications:    acetaminophen (TYLENOL) 500 MG tablet, Take 500 mg by mouth every 6 (six) hours as needed., Disp: , Rfl:    ALPHAGAN P 0.1 % SOLN, Place 1 drop into both eyes 3 (three) times daily. , Disp: , Rfl:    benzonatate (TESSALON) 100 MG capsule, Take 2 capsules (200 mg total) by mouth 2 (two) times daily as needed for cough., Disp: 30 capsule, Rfl: 0   bimatoprost (LUMIGAN) 0.01 % SOLN, Place 1 drop into both eyes at bedtime., Disp: , Rfl:    buPROPion (WELLBUTRIN XL) 150 MG 24 hr tablet, TAKE 1 TABLET (150 MG TOTAL) BY MOUTH IN THE MORNING, Disp: 90 tablet, Rfl: 1   celecoxib (CELEBREX) 100 MG capsule, Take 1 capsule (100 mg total) by mouth 2 (two) times daily., Disp: 180 capsule, Rfl: 0   Cholecalciferol (VITAMIN D3) 50 MCG (2000 UT) capsule, Take 1 capsule (2,000 Units total) by mouth daily., Disp: 100 capsule, Rfl: 1   citalopram (CELEXA) 20 MG tablet, TAKE 1 TABLET BY MOUTH EVERY DAY, Disp: 90 tablet, Rfl: 1   fluticasone (FLONASE) 50 MCG/ACT nasal spray, SPRAY 2 SPRAYS INTO EACH NOSTRIL EVERY DAY, Disp: 48 mL, Rfl: 0   latanoprost (XALATAN) 0.005 % ophthalmic solution, 1 drop., Disp: , Rfl:    omeprazole (PRILOSEC) 40 MG capsule, TAKE 1 CAPSULE (40 MG TOTAL) BY MOUTH DAILY., Disp: 90 capsule, Rfl: 1   Polyethyl Glycol-Propyl Glycol 0.4-0.3 % SOLN, Apply to  eye. Using Refresh Mega-3, Disp: , Rfl:    pravastatin (PRAVACHOL) 20 MG tablet, Take 1 tablet (20 mg total) by mouth daily., Disp: 90 tablet, Rfl: 2   predniSONE (STERAPRED UNI-PAK 21 TAB) 10 MG (21) TBPK tablet, Take as directed on package.  (60 mg po on day 1, 50 mg po on day 2...), Disp: 21 tablet, Rfl: 0   PREMARIN vaginal cream, Place 1 application vaginally 2 (two) times a week., Disp: , Rfl: 12   oxybutynin (DITROPAN-XL) 5 MG 24 hr tablet, Take 5 mg by mouth daily. (Patient not taking: Reported on 12/28/2023), Disp: , Rfl:   Allergies  Allergen Reactions   Biphosphate    Codeine  headaches   Combigan [Brimonidine Tartrate-Timolol]     drowsy   Sulfa Antibiotics Nausea And Vomiting and Nausea Only   Metronidazole Itching and Rash    I personally reviewed active problem list, medication list, allergies, family history with the patient/caregiver today.   ROS  Ten systems reviewed and is negative except as mentioned in HPI    Objective  Vitals:   12/28/23 0921  BP: 102/64  Pulse: 89  Resp: 16  Temp: 98.2 F (36.8 C)  TempSrc: Oral  SpO2: 99%  Weight: 146 lb 4.8 oz (66.4 kg)  Height: 5\' 2"  (1.575 m)    Body mass index is 26.76 kg/m.  Physical Exam  Constitutional: Patient appears well-developed and well-nourished.  No distress.  HEENT: head atraumatic, normocephalic, pupils equal and reactive to light, neck supple, throat within normal limits Cardiovascular: Normal rate, regular rhythm and normal heart sounds.  No murmur heard. No BLE edema. Pulmonary/Chest: Effort normal and breath sounds normal. No respiratory distress. Abdominal: Soft.  There is no tenderness. Psychiatric: Patient has a normal mood and affect. behavior is normal. Judgment and thought content normal.    Diabetic Foot Exam:     PHQ2/9:    12/28/2023    9:20 AM 10/01/2023    3:50 PM 08/26/2023    3:16 PM 06/25/2023    1:54 PM 06/10/2023    1:41 PM  Depression screen PHQ 2/9  Decreased  Interest 1 0 0 1 0  Down, Depressed, Hopeless 1 0 0 0 0  PHQ - 2 Score 2 0 0 1 0  Altered sleeping 1   0   Tired, decreased energy 0   0   Change in appetite 0   0   Feeling bad or failure about yourself  0   0   Trouble concentrating 1   0   Moving slowly or fidgety/restless 0   0   Suicidal thoughts 0   0   PHQ-9 Score 4   1   Difficult doing work/chores Somewhat difficult        phq 9 is positive  Fall Risk:    12/28/2023    9:16 AM 10/01/2023    3:48 PM 08/26/2023    3:15 PM 06/25/2023    1:54 PM 06/10/2023    1:44 PM  Fall Risk   Falls in the past year? 0 1 0 1 1  Number falls in past yr: 0 0 0 0 0  Injury with Fall? 0 0 0 0 0  Comment     Followed by medical attention  Risk for fall due to : No Fall Risks Medication side effect;Orthopedic patient No Fall Risks No Fall Risks No Fall Risks  Follow up Falls prevention discussed;Education provided;Falls evaluation completed Falls prevention discussed Falls prevention discussed Falls prevention discussed Falls prevention discussed     Assessment & Plan  1. Pre-op exam (Primary)  - EKG 12-Lead  2. Primary osteoarthritis of left knee  May proceed to surgery , she will have CBC done at hospital  during pre-op

## 2023-12-29 ENCOUNTER — Encounter (HOSPITAL_COMMUNITY)
Admission: RE | Admit: 2023-12-29 | Discharge: 2023-12-29 | Disposition: A | Discharge status: Home / Self Care | Payer: Medicare HMO | Source: Ambulatory Visit | Attending: Orthopedic Surgery | Admitting: Orthopedic Surgery

## 2023-12-29 ENCOUNTER — Other Ambulatory Visit: Payer: Self-pay

## 2023-12-29 ENCOUNTER — Encounter (HOSPITAL_COMMUNITY): Payer: Self-pay

## 2023-12-29 VITALS — BP 151/83 | HR 73 | Temp 97.7°F | Resp 16 | Ht 62.0 in | Wt 145.0 lb

## 2023-12-29 DIAGNOSIS — Z01812 Encounter for preprocedural laboratory examination: Secondary | ICD-10-CM | POA: Diagnosis present

## 2023-12-29 DIAGNOSIS — Z01818 Encounter for other preprocedural examination: Secondary | ICD-10-CM

## 2023-12-29 HISTORY — DX: Migraine, unspecified, not intractable, without status migrainosus: G43.909

## 2023-12-29 LAB — CBC
HCT: 42.3 % (ref 36.0–46.0)
Hemoglobin: 12.9 g/dL (ref 12.0–15.0)
MCH: 27.9 pg (ref 26.0–34.0)
MCHC: 30.5 g/dL (ref 30.0–36.0)
MCV: 91.4 fL (ref 80.0–100.0)
Platelets: 250 10*3/uL (ref 150–400)
RBC: 4.63 MIL/uL (ref 3.87–5.11)
RDW: 13.1 % (ref 11.5–15.5)
WBC: 8.3 10*3/uL (ref 4.0–10.5)
nRBC: 0 % (ref 0.0–0.2)

## 2023-12-29 LAB — SURGICAL PCR SCREEN
MRSA, PCR: NEGATIVE
Staphylococcus aureus: NEGATIVE

## 2024-01-03 ENCOUNTER — Telehealth: Payer: Self-pay | Admitting: Family Medicine

## 2024-01-03 NOTE — Telephone Encounter (Signed)
 Pt notified- verbalized understanding.

## 2024-01-03 NOTE — Telephone Encounter (Signed)
Copied from CRM [id number]. Topic: General - Inquiry >> Jan 03, 2024  2:04 PM Clide Dales wrote: Patient called to advise PCP that she forgot to mention in her visit on 2/11 that when she has her hair colored or uses hair spray that it causes a tingling sensation on the sides of her head. Patient did not know if that was a cause for concern with her having surgery on 2/25. Please advise.

## 2024-01-06 NOTE — H&P (Signed)
TOTAL KNEE ADMISSION H&P  Patient is being admitted for left total knee arthroplasty.  Therapy Plans: outpatient therapy at [facility] Disposition: Home sister then son Planned DVT Prophylaxis: aspirin 81mg  BID DME needed: **Needs walker PCP: Alba Cory - (out on medical leave, will ask another MD there) TXA: IV Allergies: codeine - gave her a headache , sulfa - , metronidazole - itching Anesthesia Concerns: none BMI: 26.9 Last HgbA1c: Not diabetic  Other: - hx of right TKA - did well - oxycodone, robaxin, tylenol, celebrex - No hx of VTE or cancer  Subjective:  Chief Complaint:left knee pain.  HPI: [name], 83 y.o. female, has a history of pain and functional disability in the left knee due to arthritis and has failed non-surgical conservative treatments for greater than 12 weeks to includeNSAID's and/or analgesics, corticosteriod injections, and activity modification.  Onset of symptoms was gradual, starting 2 years ago with gradually worsening course since that time. The patient noted no past surgery on the left knee(s).  Patient currently rates pain in the left knee(s) at 8 out of 10 with activity. Patient has worsening of pain with activity and weight bearing, pain that interferes with activities of daily living, and pain with passive range of motion.  Patient has evidence of joint space narrowing by imaging studies. There is no active infection.  Patient Active Problem List   Diagnosis Date Noted   Multiple thyroid nodules 04/09/2023   Impingement syndrome of right shoulder region 04/07/2023   Esophageal dysphagia 01/05/2023   Hiatal hernia 01/05/2023   Menopausal osteoporosis 04/08/2022   B12 deficiency 04/08/2022   History of total left hip replacement 04/08/2022   Gait instability 04/08/2022   Impaired fasting glucose    Overweight (BMI 25.0-29.9) 04/03/2020   Status post total knee replacement, right 04/02/2020   Presence of right artificial knee joint  04/02/2020   Senile purpura (HCC) 12/30/2018   S/P vaginal hysterectomy 05/30/2018   Major depression, recurrent, chronic (HCC) 03/02/2018   Gastroesophageal reflux disease without esophagitis 10/20/2016   Anxiety, generalized 01/28/2016   Abnormal brain CT 06/20/2015   Dyslipidemia 06/20/2015   Vertigo 06/20/2015   History of hypertension 06/20/2015   Arthralgia of shoulder 06/20/2015   Glaucoma 06/20/2015   Past Medical History:  Diagnosis Date   Anemia    Anxiety    no meds   Arthritis    hands, knees - no meds   Bilateral leg weakness    Complication of anesthesia    Difficult to arouse   Dental bridge present    perm lower front bottom bridge   Depression    no meds   Eyes swollen    and red - was seen by MD for possible allergy to new eye drops   GERD (gastroesophageal reflux disease)    diet controlled - only takes med prn basis   Glaucoma    Hypercholesteremia    Hypertension    Migraine headache    Osteoporosis    SVD (spontaneous vaginal delivery)    x 3 - only one currently living   Vertigo     Past Surgical History:  Procedure Laterality Date   ABDOMINAL HYSTERECTOMY     ANAL FISSURE REPAIR  1970s   for bleeding in 1970s   ANTERIOR AND POSTERIOR REPAIR WITH SACROSPINOUS FIXATION N/A 05/30/2018   Procedure: ANTERIOR AND POSTERIOR REPAIR;  Surgeon: Candice Camp, MD;  Location: [facility] ORS;  Service: Gynecology;  Laterality: N/A;   bilateral cataracts removed Bilateral  COLONOSCOPY  2012   [facility]   COLONOSCOPY WITH PROPOFOL N/A 09/01/2016   Procedure: COLONOSCOPY WITH PROPOFOL;  Surgeon: Earline Mayotte, MD;  Location: [facility] ENDOSCOPY;  Service: Endoscopy;  Laterality: N/A;   COLONOSCOPY WITH PROPOFOL N/A 09/10/2021   Procedure: COLONOSCOPY WITH PROPOFOL;  Surgeon: Earline Mayotte, MD;  Location: [facility] ENDOSCOPY;  Service: Endoscopy;  Laterality: N/A;   ESOPHAGOGASTRODUODENOSCOPY (EGD) WITH PROPOFOL N/A 01/05/2023   Procedure: ESOPHAGOGASTRODUODENOSCOPY  (EGD) WITH PROPOFOL;  Surgeon: Toney Reil, MD;  Location: [facility] ENDOSCOPY;  Service: Gastroenterology;  Laterality: N/A;   EYE SURGERY Bilateral    cataracts removed   KNEE SURGERY Left    SALPINGOOPHORECTOMY Right 05/30/2018   Procedure: SALPINGO OOPHORECTOMY;  Surgeon: Candice Camp, MD;  Location: [facility] ORS;  Service: Gynecology;  Laterality: Right;   SUBCLAVIAN ANGIOGRAM     TOTAL HIP ARTHROPLASTY Left 01/28/2021   Procedure: TOTAL HIP ARTHROPLASTY ANTERIOR APPROACH;  Surgeon: Kennedy Bucker, MD;  Location: [facility] ORS;  Service: Orthopedics;  Laterality: Left;   TOTAL KNEE ARTHROPLASTY Right 04/02/2020   Procedure: TOTAL KNEE ARTHROPLASTY;  Surgeon: Durene Romans, MD;  Location: [facility] ORS;  Service: Orthopedics;  Laterality: Right;  70 mins   UPPER GI ENDOSCOPY  2012   VAGINAL HYSTERECTOMY N/A 05/30/2018   Procedure: HYSTERECTOMY VAGINAL;  Surgeon: Candice Camp, MD;  Location: [facility] ORS;  Service: Gynecology;  Laterality: N/A;   WISDOM TOOTH EXTRACTION      No current facility-administered medications for this encounter.   Current Outpatient Medications  Medication Sig Dispense Refill Last Dose/Taking   acetaminophen (TYLENOL) 500 MG tablet Take 500-1,000 mg by mouth every 6 (six) hours as needed for mild pain (pain score 1-3) or moderate pain (pain score 4-6).   Taking As Needed   brimonidine (ALPHAGAN) 0.2 % ophthalmic solution Place 1 drop into both eyes 3 (three) times daily.   Taking   buPROPion (WELLBUTRIN XL) 150 MG 24 hr tablet TAKE 1 TABLET (150 MG TOTAL) BY MOUTH IN THE MORNING 90 tablet 1 Taking   celecoxib (CELEBREX) 100 MG capsule Take 1 capsule (100 mg total) by mouth 2 (two) times daily. (Patient taking differently: Take 100 mg by mouth daily as needed for mild pain (pain score 1-3) or moderate pain (pain score 4-6).) 180 capsule 0 Taking Differently   Cholecalciferol (VITAMIN D3) 50 MCG (2000 UT) capsule Take 1 capsule (2,000 Units total) by mouth daily. 100 capsule 1 Taking    citalopram (CELEXA) 20 MG tablet TAKE 1 TABLET BY MOUTH EVERY DAY 90 tablet 1 Taking   fluticasone (FLONASE) 50 MCG/ACT nasal spray SPRAY 2 SPRAYS INTO EACH NOSTRIL EVERY DAY (Patient taking differently: Place 2 sprays into both nostrils daily as needed for allergies or rhinitis.) 48 mL 0 Taking Differently   Guaifenesin (MUCINEX MAXIMUM STRENGTH) 1200 MG TB12 Take 1,200 mg by mouth 2 (two) times daily as needed (congestion).   Taking As Needed   latanoprost (XALATAN) 0.005 % ophthalmic solution Place 1 drop into both eyes at bedtime.   Taking   omeprazole (PRILOSEC) 40 MG capsule TAKE 1 CAPSULE (40 MG TOTAL) BY MOUTH DAILY. 90 capsule 1 Taking   Polyvinyl Alcohol-Povidone (REFRESH OP) Place 2 drops into both eyes 3 (three) times daily as needed (dry eye).   Taking As Needed   pravastatin (PRAVACHOL) 20 MG tablet Take 1 tablet (20 mg total) by mouth daily. 90 tablet 2 Taking   vitamin B-12 (CYANOCOBALAMIN) 500 MCG tablet Take 500 mcg by mouth daily.   Taking  ALPHAGAN P 0.1 % SOLN Place 1 drop into both eyes 3 (three) times daily.  (Patient not taking: Reported on 12/29/2023)   Not Taking   PREMARIN vaginal cream Place 1 application vaginally 2 (two) times a week. (Patient not taking: Reported on 12/29/2023)  12 Not Taking   Allergies  Allergen Reactions   Biphosphate     Unknown    Codeine     headaches   Combigan [Brimonidine Tartrate-Timolol]     drowsy   Sulfa Antibiotics Nausea And Vomiting and Nausea Only   Metronidazole Itching and Rash    Social History   Tobacco Use   Smoking status: Never   Smokeless tobacco: Never   Tobacco comments:    smoking cessation materials not required  Substance Use Topics   Alcohol use: Yes    Comment: Rare    Family History  Problem Relation Age of Onset   Cancer Mother 21       colon   Alzheimer's disease Mother    Hypertension Mother    Prostate cancer Father    Hypertension Father    Cerebral aneurysm Father    Diabetes Sister     Depression Sister    Hypertension Sister    Depression Sister    Depression Brother    Cancer Brother        prostate   Cancer Brother    Gallbladder disease Brother    Prostate cancer Brother    Lymphoma Son    Cancer Son 48       lymphoma   Breast cancer Neg Hx      Review of Systems  Constitutional:  Negative for chills and fever.  Respiratory:  Negative for cough and shortness of breath.   Cardiovascular:  Negative for chest pain.  Gastrointestinal:  Negative for nausea and vomiting.  Musculoskeletal:  Positive for arthralgias.     Objective:  Physical Exam Well nourished and well developed. General: Alert and oriented x3, cooperative and pleasant, no acute distress.  Musculoskeletal: Left knee exam: No palpable effusion, warmth erythema Slight flexion contracture associated with her valgus left knee Tenderness more over the lateral and anterior aspect knee Passively correctable valgus with some discomfort No significant lower extremity edema, erythema or calf tenderness  Calves soft and nontender. Motor function intact in LE. Strength 5/5 LE bilaterally. Neuro: Distal pulses 2+. Sensation to light touch intact in LE.  Vital signs in last 24 hours:    Labs:   Estimated body mass index is 26.52 kg/m as calculated from the following:   Height as of 12/29/23: 5\' 2"  (1.575 m).   Weight as of 12/29/23: 65.8 kg.   Imaging Review Plain radiographs demonstrate severe degenerative joint disease of the left knee(s). The overall alignment isneutral. The bone quality appears to be adequate for age and reported activity level.      Assessment/Plan:  End stage arthritis, left knee   The patient history, physical examination, clinical judgment of the provider and imaging studies are consistent with end stage degenerative joint disease of the left knee(s) and total knee arthroplasty is deemed medically necessary. The treatment options including medical management,  injection therapy arthroscopy and arthroplasty were discussed at length. The risks and benefits of total knee arthroplasty were presented and reviewed. The risks due to aseptic loosening, infection, stiffness, patella tracking problems, thromboembolic complications and other imponderables were discussed. The patient acknowledged the explanation, agreed to proceed with the plan and consent was signed. Patient is being admitted  for inpatient treatment for surgery, pain control, PT, OT, prophylactic antibiotics, VTE prophylaxis, progressive ambulation and ADL's and discharge planning. The patient is planning to be discharged  home.     Patient's anticipated LOS is less than 2 midnights, meeting these requirements: - Younger than 41 - Lives within 1 hour of care - Has a competent adult at home to recover with post-op recover - NO history of  - Chronic pain requiring opiods  - Diabetes  - Coronary Artery Disease  - Heart failure  - Heart attack  - Stroke  - DVT/VTE  - Cardiac arrhythmia  - Respiratory Failure/COPD  - Renal failure  - Anemia  - Advanced Liver disease  Rosalene Billings, PA-C Orthopedic Surgery [facility] [place] [phone]

## 2024-01-10 ENCOUNTER — Telehealth (HOSPITAL_COMMUNITY): Payer: Self-pay

## 2024-01-10 NOTE — Telephone Encounter (Signed)
 Patient called back. Verified that surgery is scheduled for 12:35 pm on 01/11/24 with an arrival time of 10AM. Patient verbalized understanding. My Chart message sent per patient request.

## 2024-01-11 ENCOUNTER — Encounter (HOSPITAL_COMMUNITY)
Admission: RE | Disposition: A | Discharge status: Home / Self Care | Payer: Self-pay | Source: Home / Self Care | Attending: Orthopedic Surgery

## 2024-01-11 ENCOUNTER — Other Ambulatory Visit: Payer: Self-pay

## 2024-01-11 ENCOUNTER — Ambulatory Visit (HOSPITAL_COMMUNITY): Payer: Medicare HMO | Admitting: Certified Registered"

## 2024-01-11 ENCOUNTER — Observation Stay (HOSPITAL_COMMUNITY)
Admission: RE | Admit: 2024-01-11 | Discharge: 2024-01-12 | Disposition: A | Discharge status: Home / Self Care | Payer: Medicare HMO | Attending: Orthopedic Surgery | Admitting: Orthopedic Surgery

## 2024-01-11 ENCOUNTER — Encounter (HOSPITAL_COMMUNITY): Payer: Self-pay | Admitting: Orthopedic Surgery

## 2024-01-11 DIAGNOSIS — M1712 Unilateral primary osteoarthritis, left knee: Principal | ICD-10-CM | POA: Insufficient documentation

## 2024-01-11 DIAGNOSIS — I739 Peripheral vascular disease, unspecified: Secondary | ICD-10-CM | POA: Diagnosis not present

## 2024-01-11 DIAGNOSIS — Z96642 Presence of left artificial hip joint: Secondary | ICD-10-CM | POA: Diagnosis not present

## 2024-01-11 DIAGNOSIS — I10 Essential (primary) hypertension: Secondary | ICD-10-CM | POA: Insufficient documentation

## 2024-01-11 DIAGNOSIS — Z79899 Other long term (current) drug therapy: Secondary | ICD-10-CM | POA: Insufficient documentation

## 2024-01-11 DIAGNOSIS — E785 Hyperlipidemia, unspecified: Secondary | ICD-10-CM | POA: Diagnosis not present

## 2024-01-11 DIAGNOSIS — Z96651 Presence of right artificial knee joint: Secondary | ICD-10-CM | POA: Insufficient documentation

## 2024-01-11 DIAGNOSIS — Z96652 Presence of left artificial knee joint: Principal | ICD-10-CM

## 2024-01-11 DIAGNOSIS — M170 Bilateral primary osteoarthritis of knee: Secondary | ICD-10-CM

## 2024-01-11 DIAGNOSIS — G8918 Other acute postprocedural pain: Secondary | ICD-10-CM | POA: Diagnosis not present

## 2024-01-11 HISTORY — PX: TOTAL KNEE ARTHROPLASTY: SHX125

## 2024-01-11 SURGERY — ARTHROPLASTY, KNEE, TOTAL
Anesthesia: Spinal | Site: Knee | Laterality: Left

## 2024-01-11 MED ORDER — METHOCARBAMOL 500 MG PO TABS
500.0000 mg | ORAL_TABLET | Freq: Four times a day (QID) | ORAL | Status: DC | PRN
  Administered 2024-01-11 – 2024-01-12 (×2): 500 mg via ORAL
  Filled 2024-01-11: qty 1

## 2024-01-11 MED ORDER — PANTOPRAZOLE SODIUM 40 MG PO TBEC
40.0000 mg | DELAYED_RELEASE_TABLET | Freq: Every day | ORAL | Status: DC
  Administered 2024-01-12: 40 mg via ORAL
  Filled 2024-01-11: qty 1

## 2024-01-11 MED ORDER — KETOROLAC TROMETHAMINE 30 MG/ML IJ SOLN
INTRAMUSCULAR | Status: DC | PRN
  Administered 2024-01-11: 30 mg

## 2024-01-11 MED ORDER — OXYCODONE HCL 5 MG PO TABS
ORAL_TABLET | ORAL | Status: AC
  Filled 2024-01-11: qty 1

## 2024-01-11 MED ORDER — SODIUM CHLORIDE 0.9% FLUSH
3.0000 mL | INTRAVENOUS | Status: DC | PRN
Start: 2024-01-11 — End: 2024-01-11

## 2024-01-11 MED ORDER — ORAL CARE MOUTH RINSE: 15.0000 mL | Freq: Once | OROMUCOSAL | Status: AC

## 2024-01-11 MED ORDER — TRANEXAMIC ACID-NACL 1000-0.7 MG/100ML-% IV SOLN
INTRAVENOUS | Status: AC
Start: 2024-01-11 — End: 2024-01-12
  Filled 2024-01-11: qty 100

## 2024-01-11 MED ORDER — AMISULPRIDE (ANTIEMETIC) 5 MG/2ML IV SOLN: 10.0000 mg | Freq: Once | INTRAVENOUS | Status: DC | PRN

## 2024-01-11 MED ORDER — BUPROPION HCL ER (XL) 150 MG PO TB24
150.0000 mg | ORAL_TABLET | Freq: Every day | ORAL | Status: DC
Start: 2024-01-12 — End: 2024-01-12
  Administered 2024-01-12: 150 mg via ORAL
  Filled 2024-01-11: qty 1

## 2024-01-11 MED ORDER — CHLORHEXIDINE GLUCONATE 0.12 % MT SOLN
15.0000 mL | Freq: Once | OROMUCOSAL | Status: AC
  Administered 2024-01-11: 15 mL via OROMUCOSAL

## 2024-01-11 MED ORDER — CEFAZOLIN SODIUM-DEXTROSE 2-4 GM/100ML-% IV SOLN
2.0000 g | INTRAVENOUS | Status: AC
  Administered 2024-01-11: 2 g via INTRAVENOUS
  Filled 2024-01-11: qty 100

## 2024-01-11 MED ORDER — ESMOLOL HCL 100 MG/10ML IV SOLN
INTRAVENOUS | Status: DC | PRN
  Administered 2024-01-11: 30 mg via INTRAVENOUS

## 2024-01-11 MED ORDER — KETOROLAC TROMETHAMINE 30 MG/ML IJ SOLN
INTRAMUSCULAR | Status: AC
  Filled 2024-01-11: qty 1

## 2024-01-11 MED ORDER — MENTHOL 3 MG MT LOZG: 1.0000 | LOZENGE | OROMUCOSAL | Status: DC | PRN

## 2024-01-11 MED ORDER — ACETAMINOPHEN 500 MG PO TABS
1000.0000 mg | ORAL_TABLET | Freq: Four times a day (QID) | ORAL | Status: DC
  Administered 2024-01-11 – 2024-01-12 (×4): 1000 mg via ORAL
  Filled 2024-01-11 (×3): qty 2

## 2024-01-11 MED ORDER — BISACODYL 10 MG RE SUPP: 10.0000 mg | Freq: Every day | RECTAL | Status: DC | PRN

## 2024-01-11 MED ORDER — MIDAZOLAM HCL 2 MG/2ML IJ SOLN: 2.0000 mg | Freq: Once | INTRAMUSCULAR | Status: DC

## 2024-01-11 MED ORDER — DEXAMETHASONE SODIUM PHOSPHATE 10 MG/ML IJ SOLN
8.0000 mg | Freq: Once | INTRAMUSCULAR | Status: AC
  Administered 2024-01-11: 10 mg via INTRAVENOUS

## 2024-01-11 MED ORDER — LACTATED RINGERS IV SOLN: INTRAVENOUS | Status: DC

## 2024-01-11 MED ORDER — PHENYLEPHRINE HCL-NACL 20-0.9 MG/250ML-% IV SOLN
INTRAVENOUS | Status: DC | PRN
  Administered 2024-01-11: 50 ug/min via INTRAVENOUS

## 2024-01-11 MED ORDER — SODIUM CHLORIDE 0.9% FLUSH: 3.0000 mL | Freq: Two times a day (BID) | INTRAVENOUS | Status: DC

## 2024-01-11 MED ORDER — SODIUM CHLORIDE 0.9% FLUSH
3.0000 mL | Freq: Two times a day (BID) | INTRAVENOUS | Status: DC
  Administered 2024-01-12: 3 mL via INTRAVENOUS

## 2024-01-11 MED ORDER — POVIDONE-IODINE 10 % EX SWAB: 2.0000 | Freq: Once | CUTANEOUS | Status: DC

## 2024-01-11 MED ORDER — ROPIVACAINE HCL 5 MG/ML IJ SOLN
INTRAMUSCULAR | Status: DC | PRN
  Administered 2024-01-11: 20 mL via PERINEURAL

## 2024-01-11 MED ORDER — METOCLOPRAMIDE HCL 5 MG PO TABS: 5.0000 mg | ORAL_TABLET | Freq: Three times a day (TID) | ORAL | Status: DC | PRN

## 2024-01-11 MED ORDER — METHOCARBAMOL 500 MG PO TABS
ORAL_TABLET | ORAL | Status: AC
  Filled 2024-01-11: qty 1

## 2024-01-11 MED ORDER — SODIUM CHLORIDE 0.9% FLUSH: 3.0000 mL | INTRAVENOUS | Status: DC | PRN

## 2024-01-11 MED ORDER — SODIUM CHLORIDE 0.9 % IR SOLN
Status: DC | PRN
  Administered 2024-01-11: 1000 mL

## 2024-01-11 MED ORDER — FENTANYL CITRATE PF 50 MCG/ML IJ SOSY: 25.0000 ug | PREFILLED_SYRINGE | INTRAMUSCULAR | Status: DC | PRN

## 2024-01-11 MED ORDER — METHOCARBAMOL 1000 MG/10ML IJ SOLN: 500.0000 mg | Freq: Four times a day (QID) | INTRAMUSCULAR | Status: DC | PRN

## 2024-01-11 MED ORDER — 0.9 % SODIUM CHLORIDE (POUR BTL) OPTIME
TOPICAL | Status: DC | PRN
  Administered 2024-01-11: 1000 mL

## 2024-01-11 MED ORDER — SODIUM CHLORIDE (PF) 0.9 % IJ SOLN
INTRAMUSCULAR | Status: AC
  Filled 2024-01-11: qty 30

## 2024-01-11 MED ORDER — STERILE WATER FOR IRRIGATION IR SOLN
Status: DC | PRN
  Administered 2024-01-11: 1000 mL

## 2024-01-11 MED ORDER — BUPIVACAINE IN DEXTROSE 0.75-8.25 % IT SOLN
INTRATHECAL | Status: DC | PRN
  Administered 2024-01-11: 1.6 mL via INTRATHECAL

## 2024-01-11 MED ORDER — TRANEXAMIC ACID-NACL 1000-0.7 MG/100ML-% IV SOLN
1000.0000 mg | Freq: Once | INTRAVENOUS | Status: AC
  Administered 2024-01-11: 1000 mg via INTRAVENOUS

## 2024-01-11 MED ORDER — ALUM & MAG HYDROXIDE-SIMETH 200-200-20 MG/5ML PO SUSP: 30.0000 mL | ORAL | Status: DC | PRN

## 2024-01-11 MED ORDER — ONDANSETRON HCL 4 MG/2ML IJ SOLN: 4.0000 mg | Freq: Four times a day (QID) | INTRAMUSCULAR | Status: DC | PRN

## 2024-01-11 MED ORDER — ONDANSETRON HCL 4 MG PO TABS: 4.0000 mg | ORAL_TABLET | Freq: Four times a day (QID) | ORAL | Status: DC | PRN

## 2024-01-11 MED ORDER — ACETAMINOPHEN 500 MG PO TABS
ORAL_TABLET | ORAL | Status: AC
  Filled 2024-01-11: qty 2

## 2024-01-11 MED ORDER — OXYCODONE HCL 5 MG PO TABS: 10.0000 mg | ORAL_TABLET | ORAL | Status: DC | PRN

## 2024-01-11 MED ORDER — METOPROLOL TARTRATE 5 MG/5ML IV SOLN
INTRAVENOUS | Status: DC | PRN
  Administered 2024-01-11: 3 mg via INTRAVENOUS
  Administered 2024-01-11: 2 mg via INTRAVENOUS

## 2024-01-11 MED ORDER — PHENYLEPHRINE HCL (PRESSORS) 10 MG/ML IV SOLN
INTRAVENOUS | Status: AC
  Filled 2024-01-11: qty 1

## 2024-01-11 MED ORDER — PROPOFOL 500 MG/50ML IV EMUL
INTRAVENOUS | Status: DC | PRN
  Administered 2024-01-11: 100 ug/kg/min via INTRAVENOUS

## 2024-01-11 MED ORDER — POLYETHYLENE GLYCOL 3350 17 G PO PACK
17.0000 g | PACK | Freq: Two times a day (BID) | ORAL | Status: DC
  Filled 2024-01-11: qty 1

## 2024-01-11 MED ORDER — ASPIRIN 81 MG PO CHEW
81.0000 mg | CHEWABLE_TABLET | Freq: Two times a day (BID) | ORAL | Status: DC
  Administered 2024-01-11 – 2024-01-12 (×2): 81 mg via ORAL
  Filled 2024-01-11 (×2): qty 1

## 2024-01-11 MED ORDER — DEXAMETHASONE SODIUM PHOSPHATE 10 MG/ML IJ SOLN
10.0000 mg | Freq: Once | INTRAMUSCULAR | Status: AC
  Administered 2024-01-12: 10 mg via INTRAVENOUS
  Filled 2024-01-11: qty 1

## 2024-01-11 MED ORDER — CELECOXIB 200 MG PO CAPS
200.0000 mg | ORAL_CAPSULE | Freq: Two times a day (BID) | ORAL | Status: DC
  Administered 2024-01-11 – 2024-01-12 (×2): 200 mg via ORAL
  Filled 2024-01-11 (×2): qty 1

## 2024-01-11 MED ORDER — CLONIDINE HCL (ANALGESIA) 100 MCG/ML EP SOLN
EPIDURAL | Status: DC | PRN
  Administered 2024-01-11: 50 ug

## 2024-01-11 MED ORDER — ONDANSETRON HCL 4 MG/2ML IJ SOLN
INTRAMUSCULAR | Status: DC | PRN
  Administered 2024-01-11: 4 mg via INTRAVENOUS

## 2024-01-11 MED ORDER — METOCLOPRAMIDE HCL 5 MG/ML IJ SOLN: 5.0000 mg | Freq: Three times a day (TID) | INTRAMUSCULAR | Status: DC | PRN

## 2024-01-11 MED ORDER — CEFAZOLIN SODIUM-DEXTROSE 2-4 GM/100ML-% IV SOLN
2.0000 g | Freq: Four times a day (QID) | INTRAVENOUS | Status: AC
  Administered 2024-01-11: 2 g via INTRAVENOUS
  Filled 2024-01-11: qty 100

## 2024-01-11 MED ORDER — BRIMONIDINE TARTRATE 0.2 % OP SOLN
1.0000 [drp] | Freq: Three times a day (TID) | OPHTHALMIC | Status: DC
  Administered 2024-01-11 – 2024-01-12 (×2): 1 [drp] via OPHTHALMIC
  Filled 2024-01-11: qty 5

## 2024-01-11 MED ORDER — LATANOPROST 0.005 % OP SOLN
1.0000 [drp] | Freq: Every day | OPHTHALMIC | Status: DC
  Administered 2024-01-11: 1 [drp] via OPHTHALMIC
  Filled 2024-01-11: qty 2.5

## 2024-01-11 MED ORDER — OXYCODONE HCL 5 MG PO TABS
5.0000 mg | ORAL_TABLET | ORAL | Status: DC | PRN
Start: 2024-01-11 — End: 2024-01-12
  Administered 2024-01-11: 5 mg via ORAL
  Administered 2024-01-11 – 2024-01-12 (×4): 10 mg via ORAL
  Filled 2024-01-11 (×4): qty 2

## 2024-01-11 MED ORDER — SENNA 8.6 MG PO TABS: 2.0000 | ORAL_TABLET | Freq: Every day | ORAL | Status: DC

## 2024-01-11 MED ORDER — CEFAZOLIN SODIUM-DEXTROSE 2-4 GM/100ML-% IV SOLN
INTRAVENOUS | Status: AC
  Administered 2024-01-11: 2 g via INTRAVENOUS
  Filled 2024-01-11: qty 100

## 2024-01-11 MED ORDER — PRAVASTATIN SODIUM 20 MG PO TABS
20.0000 mg | ORAL_TABLET | Freq: Every day | ORAL | Status: DC
  Administered 2024-01-12: 20 mg via ORAL
  Filled 2024-01-11: qty 1

## 2024-01-11 MED ORDER — DIPHENHYDRAMINE HCL 12.5 MG/5ML PO ELIX: 12.5000 mg | ORAL_SOLUTION | ORAL | Status: DC | PRN

## 2024-01-11 MED ORDER — BUPIVACAINE-EPINEPHRINE (PF) 0.25% -1:200000 IJ SOLN
INTRAMUSCULAR | Status: AC
  Filled 2024-01-11: qty 30

## 2024-01-11 MED ORDER — TRANEXAMIC ACID-NACL 1000-0.7 MG/100ML-% IV SOLN
1000.0000 mg | INTRAVENOUS | Status: AC
  Administered 2024-01-11: 1000 mg via INTRAVENOUS
  Filled 2024-01-11: qty 100

## 2024-01-11 MED ORDER — FENTANYL CITRATE PF 50 MCG/ML IJ SOSY
100.0000 ug | PREFILLED_SYRINGE | Freq: Once | INTRAMUSCULAR | Status: AC
  Administered 2024-01-11: 50 ug via INTRAVENOUS
  Filled 2024-01-11: qty 2

## 2024-01-11 MED ORDER — SODIUM CHLORIDE (PF) 0.9 % IJ SOLN
INTRAMUSCULAR | Status: DC | PRN
  Administered 2024-01-11: 30 mL

## 2024-01-11 MED ORDER — BUPIVACAINE-EPINEPHRINE (PF) 0.25% -1:200000 IJ SOLN
INTRAMUSCULAR | Status: DC | PRN
  Administered 2024-01-11: 30 mL

## 2024-01-11 MED ORDER — CITALOPRAM HYDROBROMIDE 20 MG PO TABS
20.0000 mg | ORAL_TABLET | Freq: Every day | ORAL | Status: DC
  Administered 2024-01-12: 20 mg via ORAL
  Filled 2024-01-11: qty 1

## 2024-01-11 MED ORDER — PHENOL 1.4 % MT LIQD: 1.0000 | OROMUCOSAL | Status: DC | PRN

## 2024-01-11 MED ORDER — HYDROMORPHONE HCL 1 MG/ML IJ SOLN: 0.5000 mg | INTRAMUSCULAR | Status: DC | PRN

## 2024-01-11 SURGICAL SUPPLY — 44 items
ATTUNE MED ANAT PAT 35 KNEE (Knees) IMPLANT
ATTUNE PSFEM LTSZ4 NARCEM KNEE (Femur) IMPLANT
ATTUNE PSRP INSR SZ4 8 KNEE (Insert) IMPLANT
BAG COUNTER SPONGE SURGICOUNT (BAG) IMPLANT
BAG ZIPLOCK 12X15 (MISCELLANEOUS) IMPLANT
BASEPLATE TIBIAL ROTATING SZ 4 (Knees) IMPLANT
BLADE SAW SGTL 13.0X1.19X90.0M (BLADE) ×1 IMPLANT
BNDG ELASTIC 6INX 5YD STR LF (GAUZE/BANDAGES/DRESSINGS) ×1 IMPLANT
BOWL SMART MIX CTS (DISPOSABLE) ×1 IMPLANT
CEMENT HV SMART SET (Cement) ×2 IMPLANT
COVER SURGICAL LIGHT HANDLE (MISCELLANEOUS) ×1 IMPLANT
CUFF TRNQT CYL 34X4.125X (TOURNIQUET CUFF) ×1 IMPLANT
DERMABOND ADVANCED .7 DNX12 (GAUZE/BANDAGES/DRESSINGS) ×1 IMPLANT
DRAPE U-SHAPE 47X51 STRL (DRAPES) ×1 IMPLANT
DRESSING AQUACEL AG SP 3.5X10 (GAUZE/BANDAGES/DRESSINGS) ×1 IMPLANT
DRSG AQUACEL AG SP 3.5X10 (GAUZE/BANDAGES/DRESSINGS) ×1 IMPLANT
DURAPREP 26ML APPLICATOR (WOUND CARE) ×2 IMPLANT
ELECT REM PT RETURN 15FT ADLT (MISCELLANEOUS) ×1 IMPLANT
GLOVE BIO SURGEON STRL SZ 6 (GLOVE) ×1 IMPLANT
GLOVE BIOGEL PI IND STRL 6.5 (GLOVE) ×1 IMPLANT
GLOVE BIOGEL PI IND STRL 7.5 (GLOVE) ×1 IMPLANT
GLOVE ORTHO TXT STRL SZ7.5 (GLOVE) ×2 IMPLANT
GOWN STRL REUS W/ TWL LRG LVL3 (GOWN DISPOSABLE) ×2 IMPLANT
HOLDER FOLEY CATH W/STRAP (MISCELLANEOUS) IMPLANT
KIT TURNOVER KIT A (KITS) IMPLANT
MANIFOLD NEPTUNE II (INSTRUMENTS) ×1 IMPLANT
NDL SAFETY ECLIPSE 18X1.5 (NEEDLE) IMPLANT
NS IRRIG 1000ML POUR BTL (IV SOLUTION) ×1 IMPLANT
PACK TOTAL KNEE CUSTOM (KITS) ×1 IMPLANT
PIN FIX SIGMA LCS THRD HI (PIN) IMPLANT
PROTECTOR NERVE ULNAR (MISCELLANEOUS) ×1 IMPLANT
SET HNDPC FAN SPRY TIP SCT (DISPOSABLE) ×1 IMPLANT
SET PAD KNEE POSITIONER (MISCELLANEOUS) ×1 IMPLANT
SPIKE FLUID TRANSFER (MISCELLANEOUS) ×2 IMPLANT
SUT MNCRL AB 4-0 PS2 18 (SUTURE) ×1 IMPLANT
SUT STRATAFIX PDS+ 0 24IN (SUTURE) ×1 IMPLANT
SUT VIC AB 1 CT1 36 (SUTURE) ×1 IMPLANT
SUT VIC AB 2-0 CT1 TAPERPNT 27 (SUTURE) ×2 IMPLANT
SYR 3ML LL SCALE MARK (SYRINGE) ×1 IMPLANT
TOWEL GREEN STERILE FF (TOWEL DISPOSABLE) ×1 IMPLANT
TRAY FOLEY MTR SLVR 16FR STAT (SET/KITS/TRAYS/PACK) ×1 IMPLANT
TUBE SUCTION HIGH CAP CLEAR NV (SUCTIONS) ×1 IMPLANT
WATER STERILE IRR 1000ML POUR (IV SOLUTION) ×2 IMPLANT
WRAP KNEE MAXI GEL POST OP (GAUZE/BANDAGES/DRESSINGS) ×1 IMPLANT

## 2024-01-11 NOTE — Interval H&P Note (Signed)
 History and Physical Interval Note:  01/11/2024 11:06 AM  [name]  has presented today for surgery, with the diagnosis of Left  knee osteoarthritis.  The various methods of treatment have been discussed with the patient and family. After consideration of risks, benefits and other options for treatment, the patient has consented to  Procedure(s) with comments: TOTAL KNEE ARTHROPLASTY (Left) - 70 mins as a surgical intervention.  The patient's history has been reviewed, patient examined, no change in status, stable for surgery.  I have reviewed the patient's chart and labs.  Questions were answered to the patient's satisfaction.     Shelda Pal

## 2024-01-11 NOTE — Transfer of Care (Signed)
 Immediate Anesthesia Transfer of Care Note  Patient: [name]  Procedure(s) Performed: TOTAL KNEE ARTHROPLASTY (Left: Knee)  Patient Location: PACU  Anesthesia Type:MAC combined with regional for post-op pain  Level of Consciousness: awake, alert , oriented, and patient cooperative  Airway & Oxygen Therapy: Patient Spontanous Breathing and Patient connected to face mask oxygen  Post-op Assessment: Report given to RN and Post -op Vital signs reviewed and stable  Post vital signs: stable  Last Vitals:  Vitals Value Taken Time  BP 139/78 01/11/24 1439  Temp    Pulse 68 01/11/24 1441  Resp 21 01/11/24 1441  SpO2 100 % 01/11/24 1441  Vitals shown include unfiled device data.  Last Pain:  Vitals:   01/11/24 1018  TempSrc: Oral  PainSc:          Complications: No notable events documented.

## 2024-01-11 NOTE — Anesthesia Preprocedure Evaluation (Signed)
 Anesthesia Evaluation  Patient identified by MRN, date of birth, ID band Patient awake    Reviewed: Allergy & Precautions, NPO status , Patient's Chart, lab work & pertinent test results  Airway Mallampati: II  TM Distance: >3 FB Neck ROM: Full    Dental  (+) Dental Advisory Given   Pulmonary neg pulmonary ROS   breath sounds clear to auscultation       Cardiovascular hypertension, + Peripheral Vascular Disease   Rhythm:Regular Rate:Normal     Neuro/Psych  Headaches    GI/Hepatic Neg liver ROS, hiatal hernia,GERD  ,,  Endo/Other  negative endocrine ROS    Renal/GU negative Renal ROS     Musculoskeletal  (+) Arthritis ,    Abdominal   Peds  Hematology negative hematology ROS (+)   Anesthesia Other Findings   Reproductive/Obstetrics                             Anesthesia Physical Anesthesia Plan  ASA: 2  Anesthesia Plan: Spinal   Post-op Pain Management: Regional block* and Ofirmev IV (intra-op)*   Induction:   PONV Risk Score and Plan: 2 and Propofol infusion, Dexamethasone, Ondansetron and Treatment may vary due to age or medical condition  Airway Management Planned: Natural Airway and Simple Face Mask  Additional Equipment:   Intra-op Plan:   Post-operative Plan:   Informed Consent: I have reviewed the patients History and Physical, chart, labs and discussed the procedure including the risks, benefits and alternatives for the proposed anesthesia with the patient or authorized representative who has indicated his/her understanding and acceptance.       Plan Discussed with: CRNA  Anesthesia Plan Comments:        Anesthesia Quick Evaluation

## 2024-01-11 NOTE — Care Plan (Addendum)
 Ortho Bundle Case Management Note  Patient Details  Name: LAVERN CRIMI MRN: [id number] Date of Birth: 31-Jul-1941  L TKA on 01-10-23 DCP:  Home with sister DME:  RW ordered through [employer] PT:  Roseanne Reno PT [facility] [place])                   DME Arranged:  Dan Humphreys rolling DME Agency:  [employer]  HH Arranged:  NA HH Agency:  NA  Additional Comments: Please contact me with any questions of if this plan should need to change.  Despina Pole, CCM, [facility] [phone] 01/11/2024, 11:04 AM

## 2024-01-11 NOTE — Anesthesia Procedure Notes (Signed)
 Spinal  Patient location during procedure: OR Start time: 01/11/2024 12:25 PM End time: 01/11/2024 12:31 PM Reason for block: surgical anesthesia Staffing Performed: anesthesiologist  Anesthesiologist: Marcene Duos, MD Performed by: Marcene Duos, MD Authorized by: Marcene Duos, MD   Preanesthetic Checklist Completed: patient identified, IV checked, site marked, risks and benefits discussed, surgical consent, monitors and equipment checked, pre-op evaluation and timeout performed Spinal Block Patient position: sitting Prep: DuraPrep Patient monitoring: heart rate, cardiac monitor, continuous pulse ox and blood pressure Approach: midline Location: L3-4 Injection technique: single-shot Needle Needle type: Pencan  Needle gauge: 24 G Needle length: 9 cm Assessment Sensory level: T4 Events: CSF return

## 2024-01-11 NOTE — Op Note (Signed)
 NAME:  [name]                      MEDICAL RECORD NO.:  [id number]                             FACILITY:  [facility]      PHYSICIAN:  Madlyn Frankel. Charlann Boxer, M.D.  DATE OF BIRTH:  1941-06-23      DATE OF PROCEDURE:  01/11/2024                                     OPERATIVE REPORT         PREOPERATIVE DIAGNOSIS:  Left knee osteoarthritis.      POSTOPERATIVE DIAGNOSIS:  Left knee osteoarthritis.      FINDINGS:  The patient was noted to have complete loss of cartilage and   bone-on-bone arthritis with associated osteophytes in the lateral and patellofemoral compartments of   the knee with a significant synovitis and associated effusion.  The patient had failed months of conservative treatment including medications, injection therapy, activity modification.     PROCEDURE:  Left total knee replacement.      COMPONENTS USED:  DePuy Attune rotating platform posterior stabilized knee   system, a size 4N femur, 4 tibia, size 8 mm PS AOX insert, and 35 anatomic patellar   button.      SURGEON:  Madlyn Frankel. Charlann Boxer, M.D.      ASSISTANT:  Rosalene Billings, PA-C.      ANESTHESIA:  Regional and Spinal.      SPECIMENS:  None.      COMPLICATION:  None.      DRAINS:  None.  EBL: <150 cc      TOURNIQUET TIME:  29 min at 225 mmHg     The patient was stable to the recovery room.      INDICATION FOR PROCEDURE:  [name] is a 83 y.o. female patient of   mine.  The patient had been seen, evaluated, and treated for months conservatively in the   office with medication, activity modification, and injections.  The patient had   radiographic changes of bone-on-bone arthritis with endplate sclerosis and osteophytes noted.  Based on the radiographic changes and failed conservative measures, the patient   decided to proceed with definitive treatment, total knee replacement.  Risks of infection, DVT, component failure, need for revision surgery, neurovascular injury were reviewed in the office  setting.  The postop course was reviewed stressing the efforts to maximize post-operative satisfaction and function.  Consent was obtained for benefit of pain   relief.      PROCEDURE IN DETAIL:  The patient was brought to the operative theater.   Once adequate anesthesia, preoperative antibiotics, 2 gm of Ancef,1 gm of Tranexamic Acid, and 10 mg of Decadron administered, the patient was positioned supine with a left thigh tourniquet placed.  The  left lower extremity was prepped and draped in sterile fashion.  A time-   out was performed identifying the patient, planned procedure, and the appropriate extremity.      The left lower extremity was placed in the [facility] leg holder.  The leg was   exsanguinated, tourniquet elevated to 225 mmHg.  A midline incision was   made followed by median parapatellar arthrotomy.  Following initial   exposure, attention was  first directed to the patella.  Precut   measurement was noted to be 24 mm.  I resected down to 14 mm and used a   35 anatomic patellar button to restore patellar height as well as cover the cut surface.      The lug holes were drilled and a metal shim was placed to protect the   patella from retractors and saw blade during the procedure.      At this point, attention was now directed to the femur.  The femoral   canal was opened with a drill, irrigated to try to prevent fat emboli.  An   intramedullary rod was passed at 3 degrees valgus, 12 mm of bone was   resected off the distal femur.  Following this resection, the tibia was   subluxated anteriorly.  Using the extramedullary guide, 2 mm of bone was resected off   the proximal lateral tibia.  We confirmed the gap would be   stable medially and laterally with a size 6 spacer block as well as confirmed that the tibial cut was perpendicular in the coronal plane, checking with an alignment rod.      Once this was done, I sized the femur to be a size 4 in the anterior-   posterior  dimension, chose a narrow component based on medial and   lateral dimension.  The size 4 rotation block was then pinned in   position anterior referenced using the C-clamp to set rotation.  The   anterior, posterior, and  chamfer cuts were made without difficulty nor   notching making certain that I was along the anterior cortex to help   with flexion gap stability.      The final box cut was made off the lateral aspect of distal femur.      At this point, the tibia was sized to be a size 4.  The size 4 tray was   then pinned in position through the medial third of the tubercle,   drilled, and keel punched.  Trial reduction was now carried with a 4 femur,  4 tibia, a size 8 mm PS insert, and the 35 anatomic patella botton.  The knee was brought to full extension with good flexion stability with the patella   tracking through the trochlea without application of pressure.  Given   all these findings the trial components removed.  Final components were   opened and cement was mixed.  The knee was irrigated with normal saline solution and pulse lavage.  The synovial lining was   then injected with 30 cc of 0.25% Marcaine with epinephrine, 1 cc of Toradol and 30 cc of NS for a total of 61 cc.     Final implants were then cemented onto cleaned and dried cut surfaces of bone with the knee brought to extension with a size 8 mm PS trial insert.      Once the cement had fully cured, excess cement was removed   throughout the knee.  I confirmed that I was satisfied with the range of   motion and stability, and the final size 8 mm PS AOX insert was chosen.  It was   placed into the knee.      The tourniquet had been let down at 29 minutes.  No significant   hemostasis was required.  The extensor mechanism was then reapproximated using #1 Vicryl and #1 Stratafix sutures with the knee   in flexion.  The  remaining wound was closed with 2-0 Vicryl and running 4-0 Monocryl.   The knee was cleaned,  dried, dressed sterilely using Dermabond and   Aquacel dressing.  The patient was then   brought to recovery room in stable condition, tolerating the procedure   well.   Please note that Physician Assistant, Rosalene Billings, PA-C was present for the entirety of the case, and was utilized for pre-operative positioning, peri-operative retractor management, general facilitation of the procedure and for primary wound closure at the end of the case.              Madlyn Frankel Charlann Boxer, M.D.    01/11/2024 11:07 AM

## 2024-01-11 NOTE — Anesthesia Procedure Notes (Signed)
 Anesthesia Regional Block: Adductor canal block   Pre-Anesthetic Checklist: , timeout performed,  Correct Patient, Correct Site, Correct Laterality,  Correct Procedure, Correct Position, site marked,  Risks and benefits discussed,  Surgical consent,  Pre-op evaluation,  At surgeon's request and post-op pain management  Laterality: Left  Prep: chloraprep       Needles:  Injection technique: Single-shot  Needle Type: Echogenic Needle     Needle Length: 9cm  Needle Gauge: 21     Additional Needles:   Procedures:,,,, ultrasound used (permanent image in chart),,    Narrative:  Start time: 01/11/2024 11:35 AM End time: 01/11/2024 11:41 AM Injection made incrementally with aspirations every 5 mL.  Performed by: Personally  Anesthesiologist: Marcene Duos, MD

## 2024-01-11 NOTE — Anesthesia Postprocedure Evaluation (Signed)
 Anesthesia Post Note  Patient: [name]  Procedure(s) Performed: TOTAL KNEE ARTHROPLASTY (Left: Knee)     Patient location during evaluation: PACU Anesthesia Type: Spinal Level of consciousness: awake and alert Pain management: pain level controlled Vital Signs Assessment: post-procedure vital signs reviewed and stable Respiratory status: spontaneous breathing and respiratory function stable Cardiovascular status: blood pressure returned to baseline and stable Postop Assessment: spinal receding Anesthetic complications: no  No notable events documented.  Last Vitals:  Vitals:   01/11/24 1630 01/11/24 1700  BP: 131/71 (!) 156/68  Pulse: 64 67  Resp: 13   Temp:    SpO2: 91% 97%    Last Pain:  Vitals:   01/11/24 1630  TempSrc:   PainSc: 0-No pain                 Kennieth Rad

## 2024-01-11 NOTE — Discharge Instructions (Signed)

## 2024-01-12 ENCOUNTER — Encounter (HOSPITAL_COMMUNITY): Payer: Self-pay | Admitting: Orthopedic Surgery

## 2024-01-12 ENCOUNTER — Other Ambulatory Visit: Payer: Self-pay | Admitting: Cardiology

## 2024-01-12 ENCOUNTER — Observation Stay (INDEPENDENT_AMBULATORY_CARE_PROVIDER_SITE_OTHER): Payer: Medicare HMO

## 2024-01-12 DIAGNOSIS — I479 Paroxysmal tachycardia, unspecified: Secondary | ICD-10-CM

## 2024-01-12 DIAGNOSIS — R000 Tachycardia, unspecified: Secondary | ICD-10-CM | POA: Diagnosis not present

## 2024-01-12 DIAGNOSIS — M1712 Unilateral primary osteoarthritis, left knee: Secondary | ICD-10-CM | POA: Diagnosis not present

## 2024-01-12 DIAGNOSIS — Z96652 Presence of left artificial knee joint: Secondary | ICD-10-CM | POA: Diagnosis not present

## 2024-01-12 LAB — CBC
HCT: 33.8 % — ABNORMAL LOW (ref 36.0–46.0)
Hemoglobin: 10.7 g/dL — ABNORMAL LOW (ref 12.0–15.0)
MCH: 28.5 pg (ref 26.0–34.0)
MCHC: 31.7 g/dL (ref 30.0–36.0)
MCV: 90.1 fL (ref 80.0–100.0)
Platelets: 221 10*3/uL (ref 150–400)
RBC: 3.75 MIL/uL — ABNORMAL LOW (ref 3.87–5.11)
RDW: 13.2 % (ref 11.5–15.5)
WBC: 10.1 10*3/uL (ref 4.0–10.5)
nRBC: 0 % (ref 0.0–0.2)

## 2024-01-12 LAB — BASIC METABOLIC PANEL
Anion gap: 9 (ref 5–15)
BUN: 16 mg/dL (ref 8–23)
CO2: 24 mmol/L (ref 22–32)
Calcium: 8.1 mg/dL — ABNORMAL LOW (ref 8.9–10.3)
Chloride: 104 mmol/L (ref 98–111)
Creatinine, Ser: 0.59 mg/dL (ref 0.44–1.00)
GFR, Estimated: >60 mL/min (ref >60)
Glucose, Bld: 136 mg/dL — ABNORMAL HIGH (ref 70–99)
Potassium: 3.9 mmol/L (ref 3.5–5.1)
Sodium: 137 mmol/L (ref 135–145)

## 2024-01-12 MED ORDER — SENNA 8.6 MG PO TABS: 2.0000 | ORAL_TABLET | Freq: Every day | ORAL | 0 refills | Status: AC

## 2024-01-12 MED ORDER — ASPIRIN 81 MG PO CHEW: 81.0000 mg | CHEWABLE_TABLET | Freq: Two times a day (BID) | ORAL | 0 refills | Status: AC

## 2024-01-12 MED ORDER — METHOCARBAMOL 500 MG PO TABS: 500.0000 mg | ORAL_TABLET | Freq: Four times a day (QID) | ORAL | 2 refills | Status: DC | PRN

## 2024-01-12 MED ORDER — POLYETHYLENE GLYCOL 3350 17 G PO PACK: 17.0000 g | PACK | Freq: Two times a day (BID) | ORAL | 0 refills | Status: DC

## 2024-01-12 MED ORDER — OXYCODONE HCL 5 MG PO TABS: 5.0000 mg | ORAL_TABLET | ORAL | 0 refills | Status: DC | PRN

## 2024-01-12 MED ORDER — CELECOXIB 100 MG PO CAPS: 100.0000 mg | ORAL_CAPSULE | Freq: Two times a day (BID) | ORAL | 0 refills | Status: DC

## 2024-01-12 NOTE — Plan of Care (Signed)
   Problem: Coping: Goal: Level of anxiety will decrease Outcome: Progressing   Problem: Pain Managment: Goal: General experience of comfort will improve and/or be controlled Outcome: Progressing   Problem: Safety: Goal: Ability to remain free from injury will improve Outcome: Progressing

## 2024-01-12 NOTE — Progress Notes (Signed)
 Physical Therapy Treatment Patient Details Name: [name] MRN: [id number] DOB: 30-Dec-1940 Today's Date: 01/12/2024   History of Present Illness 83 y.o. female s/p L TKA on 01/11/24. pt developed intraop tachycardia, cardiology consulted 01/12/2024 at request of Dr. Charlann Boxer. PMH: osteoarthritis, esophageal dysphagia, hiatal hernia, depression, GERD, anxiety, hyperlipidemia, L THA.    PT Comments  Pt is progressing very well, meeting goals and ready to d/c with sister assisting as needed from PT standpoint.     If plan is discharge home, recommend the following: A little help with walking and/or transfers;A little help with bathing/dressing/bathroom;Help with stairs or ramp for entrance;Assist for transportation;Assistance with cooking/housework   Can travel by private vehicle        Equipment Recommendations  None recommended by PT    Recommendations for Other Services       Precautions / Restrictions Precautions Precautions: Fall;Knee Recall of Precautions/Restrictions: Intact Restrictions Weight Bearing Restrictions Per Provider Order: No LLE Weight Bearing Per Provider Order: Weight bearing as tolerated     Mobility  Bed Mobility Overal bed mobility: Needs Assistance Bed Mobility: Supine to Sit, Sit to Supine     Supine to sit: Supervision Sit to supine: Supervision   General bed mobility comments: for safety, incr time, no physical assist    Transfers Overall transfer level: Needs assistance Equipment used: Rolling walker (2 wheels) Transfers: Sit to/from Stand Sit to Stand: Contact guard assist           General transfer comment: cues for hand placement; STS from bed and toilet    Ambulation/Gait Ambulation/Gait assistance: Supervision, Contact guard assist Gait Distance (Feet): 55 Feet Assistive device: Rolling walker (2 wheels) Gait Pattern/deviations: Step-to pattern, Step-through pattern, Decreased stance time - left       General Gait  Details: cues for sequence and RW position, steady gait with RW, no LOB or knee instability   Stairs Stairs: Yes Stairs assistance: Supervision, Contact guard assist Stair Management: Two rails, Step to pattern, Forwards Number of Stairs: 3 General stair comments: cues for sequence and technique. good stability; reviewed with pt sister   Wheelchair Mobility     Tilt Bed    Modified Rankin (Stroke Patients Only)       Balance                                            Communication Communication Communication: No apparent difficulties  Cognition Arousal: Alert Behavior During Therapy: WFL for tasks assessed/performed   PT - Cognitive impairments: No apparent impairments                                Cueing Cueing Techniques: Verbal cues  Exercises Total Joint Exercises Ankle Circles/Pumps: AROM, Both, 10 reps Quad Sets: AROM, Both, 5 reps Heel Slides: AAROM, AROM, Left, 10 reps Straight Leg Raises: AROM, AAROM, Strengthening, Left, 5 reps    General Comments        Pertinent Vitals/Pain Pain Assessment Pain Assessment: 0-10 Pain Score: 5  Pain Location: L knee Pain Descriptors / Indicators: Sore Pain Intervention(s): Limited activity within patient's tolerance, Monitored during session, Repositioned, Premedicated before session, Patient requesting pain meds-RN notified    Home Living Family/patient expects to be discharged to:: Private residence Living Arrangements: Alone Available Help at Discharge: Available 24 hours/day;Family Type of  Home: House Home Access: Stairs to enter Entrance Stairs-Rails: Right;Left;Can reach both Secretary/administrator of Steps: 3   Home Layout: One level   Additional Comments: sister staying    Prior Function            PT Goals (current goals can now be found in the care plan section) Acute Rehab PT Goals Patient Stated Goal: less pain with moving PT Goal Formulation: With  patient Time For Goal Achievement: 01/20/24 Potential to Achieve Goals: Good Progress towards PT goals: Progressing toward goals    Frequency    7X/week      PT Plan      Co-evaluation              AM-PAC PT "6 Clicks" Mobility   Outcome Measure  Help needed turning from your back to your side while in a flat bed without using bedrails?: A Little Help needed moving from lying on your back to sitting on the side of a flat bed without using bedrails?: None Help needed moving to and from a bed to a chair (including a wheelchair)?: A Little Help needed standing up from a chair using your arms (e.g., wheelchair or bedside chair)?: A Little Help needed to walk in hospital room?: A Little Help needed climbing 3-5 steps with a railing? : A Little 6 Click Score: 19    End of Session Equipment Utilized During Treatment: Gait belt Activity Tolerance: Patient tolerated treatment well Patient left: with call bell/phone within reach;in bed;with family/visitor present;with bed alarm set Nurse Communication: Mobility status PT Visit Diagnosis: Other abnormalities of gait and mobility (R26.89);Difficulty in walking, not elsewhere classified (R26.2)     Time: 1610-9604 PT Time Calculation (min) (ACUTE ONLY): 13 min  Charges:    $Gait Training: 8-22 mins PT General Charges $$ ACUTE PT VISIT: 1 Visit                     [name], PT  Acute Rehab Dept ([facility]) [phone]  01/12/2024    [facility] 01/12/2024, 2:04 PM

## 2024-01-12 NOTE — Evaluation (Signed)
 Physical Therapy Evaluation Patient Details Name: [name] MRN: [id number] DOB: November 30, 1940 Today's Date: 01/12/2024  History of Present Illness  83 y.o. female s/p L TKA on 01/11/24. pt developed intraop tachycardia, cardiology consulted 01/12/2024 at request of Dr. Charlann Boxer. PMH: osteoarthritis, esophageal dysphagia, hiatal hernia, depression, GERD, anxiety, hyperlipidemia, L THA.  Clinical Impression  Pt is s/p TKA resulting in the deficits listed below (see PT Problem List).  Pt doing very well, amb ~ 120' with RW, CGA. Sister supportive and present for eval. Will see again this pm  and pt is hopeful to d/c home later today   Pt will benefit from acute skilled PT to increase their independence and safety with mobility to allow discharge.          If plan is discharge home, recommend the following: A little help with walking and/or transfers;A little help with bathing/dressing/bathroom;Help with stairs or ramp for entrance;Assist for transportation;Assistance with cooking/housework   Can travel by private vehicle        Equipment Recommendations None recommended by PT  Recommendations for Other Services       Functional Status Assessment Patient has had a recent decline in their functional status and demonstrates the ability to make significant improvements in function in a reasonable and predictable amount of time.     Precautions / Restrictions Precautions Precautions: Fall;Knee Restrictions Weight Bearing Restrictions Per Provider Order: No LLE Weight Bearing Per Provider Order: Weight bearing as tolerated      Mobility  Bed Mobility Overal bed mobility: Needs Assistance Bed Mobility: Supine to Sit     Supine to sit: Contact guard     General bed mobility comments: for safety    Transfers Overall transfer level: Needs assistance Equipment used: Rolling walker (2 wheels) Transfers: Sit to/from Stand Sit to Stand: Contact guard assist            General transfer comment: cues for hand placement    Ambulation/Gait Ambulation/Gait assistance: Contact guard assist Gait Distance (Feet): 120 Feet Assistive device: Rolling walker (2 wheels) Gait Pattern/deviations: Step-to pattern, Step-through pattern, Decreased stance time - left       General Gait Details: cues for sequence and RW position  Stairs            Wheelchair Mobility     Tilt Bed    Modified Rankin (Stroke Patients Only)       Balance                                             Pertinent Vitals/Pain Pain Assessment Pain Assessment: 0-10 Pain Score: 4  Pain Location: L knee Pain Descriptors / Indicators: Sore Pain Intervention(s): Limited activity within patient's tolerance, Monitored during session, Premedicated before session, Repositioned    Home Living Family/patient expects to be discharged to:: Private residence Living Arrangements: Alone Available Help at Discharge: Available 24 hours/day;Family Type of Home: House Home Access: Stairs to enter Entrance Stairs-Rails: Right;Left;Can reach both Secretary/administrator of Steps: 3   Home Layout: One level   Additional Comments: sister staying    Prior Function Prior Level of Function : Independent/Modified Independent                     Extremity/Trunk Assessment   Upper Extremity Assessment Upper Extremity Assessment: Overall WFL for tasks assessed    Lower Extremity Assessment  Lower Extremity Assessment: LLE deficits/detail LLE Deficits / Details: ankle WFL, knee extension and hip flexion 3/5; AAROM knee ~12 to 65 degrees       Communication        Cognition Arousal: Alert Behavior During Therapy: WFL for tasks assessed/performed   PT - Cognitive impairments: No apparent impairments                                 Cueing       General Comments      Exercises Total Joint Exercises Ankle Circles/Pumps: AROM, Both, 10  reps Quad Sets: AROM, Both, 5 reps   Assessment/Plan    PT Assessment Patient needs continued PT services  PT Problem List Decreased strength;Decreased activity tolerance;Decreased range of motion;Decreased mobility;Decreased knowledge of use of DME;Pain       PT Treatment Interventions DME instruction;Gait training;Functional mobility training;Therapeutic activities;Patient/family education;Stair training;Therapeutic exercise    PT Goals (Current goals can be found in the Care Plan section)  Acute Rehab PT Goals Patient Stated Goal: less pain with moving PT Goal Formulation: With patient Time For Goal Achievement: 01/20/24 Potential to Achieve Goals: Good    Frequency 7X/week     Co-evaluation               AM-PAC PT "6 Clicks" Mobility  Outcome Measure Help needed turning from your back to your side while in a flat bed without using bedrails?: A Little Help needed moving from lying on your back to sitting on the side of a flat bed without using bedrails?: A Little Help needed moving to and from a bed to a chair (including a wheelchair)?: A Little Help needed standing up from a chair using your arms (e.g., wheelchair or bedside chair)?: A Little Help needed to walk in hospital room?: A Little Help needed climbing 3-5 steps with a railing? : A Little 6 Click Score: 18    End of Session Equipment Utilized During Treatment: Gait belt Activity Tolerance: Patient tolerated treatment well Patient left: with call bell/phone within reach;in bed;with family/visitor present;with bed alarm set   PT Visit Diagnosis: Other abnormalities of gait and mobility (R26.89);Difficulty in walking, not elsewhere classified (R26.2)    Time: 1027-2536 PT Time Calculation (min) (ACUTE ONLY): 23 min   Charges:   PT Evaluation $PT Eval Low Complexity: 1 Low PT Treatments $Gait Training: 8-22 mins PT General Charges $$ ACUTE PT VISIT: 1 Visit         Bridgit Eynon, PT  Acute Rehab Dept  ([facility]) [phone]  01/12/2024   [facility] 01/12/2024, 1:01 PM

## 2024-01-12 NOTE — Care Management Obs Status (Signed)
 MEDICARE OBSERVATION STATUS NOTIFICATION   Patient Details  Name: [name] MRN: [id number] Date of Birth: 1941-05-15   Medicare Observation Status Notification Given:  Yes    Howell Rucks, RN 01/12/2024, 9:40 AM

## 2024-01-12 NOTE — TOC Transition Note (Signed)
 Transition of Care [facility]) - Discharge Note   Patient Details  Name: [name] MRN: [id number] Date of Birth: November 20, 1940  [facility] CM/SW Contact:  Howell Rucks, RN Phone Number: 01/12/2024, 11:02 AM   Clinical Narrative:  Met with pt at bedside to review dc therapy plans and home DME needs, pt confirmed OPPT at [facility], [employer] to provide RW, pt requesting [facility], [employer] with review for private purchase. No TOC needs.      Final next level of care: OP Rehab Barriers to Discharge: No Barriers Identified   Patient Goals and CMS Choice Patient states their goals for this hospitalization and ongoing recovery are:: return home          Discharge Placement                       Discharge Plan and Services Additional resources added to the After Visit Summary for                  DME Arranged: Walker rolling DME Agency: [employer]       HH Arranged: NA HH Agency: NA        Social Drivers of Health (SDOH) Interventions SDOH Screenings   Food Insecurity: No Food Insecurity (01/12/2024)  Housing: Low Risk  (01/12/2024)  Transportation Needs: No Transportation Needs (01/12/2024)  Utilities: Not At Risk (01/12/2024)  Alcohol Screen: Low Risk  (08/26/2023)  Depression (PHQ2-9): Low Risk  (12/28/2023)  Financial Resource Strain: Low Risk  (10/01/2023)  Physical Activity: Insufficiently Active (06/10/2023)  Social Connections: Moderately Integrated (01/12/2024)  Stress: No Stress Concern Present (10/01/2023)  Tobacco Use: Low Risk  (01/11/2024)  Health Literacy: Adequate Health Literacy (10/01/2023)     Readmission Risk Interventions     No data to display

## 2024-01-12 NOTE — Progress Notes (Signed)
 [name], female   DOB: [birth year], 83 y.o.   MRN: [id number] Subjective: 1 Day Post-Op Procedure(s) (LRB): TOTAL KNEE ARTHROPLASTY (Left)    Patient reports pain as mild at this point.  No events recorded overnight particularly as it relates to her peri-operative tachycardia.  Reviewed tachycardia episode in OR and discussed that we have asked Cardiology to provide insight and follow up recommendations today. Sister with her in the room.   Objective:   VITALS:   Vitals:   01/12/24 0146 01/12/24 0520  BP: 106/69 133/65  Pulse: 76 71  Resp: 17 16  Temp: 98.2 F (36.8 C) 97.7 F (36.5 C)  SpO2: 92% 95%    Neurovascular intact Incision: dressing C/D/I Regular rate and rhythm this morning to palpation  LABS Recent Labs    01/12/24 0347  HGB 10.7*  HCT 33.8*  WBC 10.1  PLT 221    Recent Labs    01/12/24 0347  NA 137  K 3.9  BUN 16  CREATININE 0.59  GLUCOSE 136*    No results for input(s): "LABPT", "INR" in the last 72 hours.   Assessment/Plan: 1 Day Post-Op Procedure(s) (LRB): TOTAL KNEE ARTHROPLASTY (Left)   Advance diet Up with therapy Reviewed goals Cardiology to see her today to provide insight and follow up plans RTC in 2 weeks Home today provided she does well with therapy and no specific work up from Cardiology

## 2024-01-12 NOTE — Progress Notes (Unsigned)
 Enrolled for [employer] to mail a ZIO XT long term holter monitor to the patients address on file.   Dr. Carolan Clines to read.

## 2024-01-12 NOTE — Consult Note (Signed)
 Cardiology Consultation   Patient ID: TAWNY RASPBERRY MRN: [id number]; DOB: [birth year]  Admit date: 01/11/2024 Date of Consult: 01/12/2024  PCP:  Alba Cory, MD   [facility] Providers Cardiologist:  None     Patient Profile:   [name] is a 83 y.o. female with a hx of osteoarthritis, esophageal dysphagia, hiatal hernia, depression, GERD, anxiety, hyperlipidemia who is being seen 01/12/2024 for the evaluation of tachycardia during surgery at the request of Dr. Charlann Boxer.  History of Present Illness:   [name] is an 83 year old female with above medical history.  Per chart review, she does not follow with a cardiologist and does not seem to have any cardiac history.  She has been followed by [facility] for osteoarthritis.  She failed nonsurgical conservative treatments and was set up for a left total knee replacement  She presented to [facility] on 2/25 for scheduled knee surgery.  During surgery, patient reportedly became tachycardic to the 140s.  EKG strip reportedly showed atrial fibrillation vs SVT.  There are no tracings in the chart available for review.  EKG from 12/28/2023 showed sinus rhythm with occasional PVCs, heart rate 76 bpm.  On interview, patient denies any cardiac history.  Reports that over 30 years ago, she saw cardiologist, completed a stress test and wore a cardiac monitor.  Both studies were reportedly normal.  She denies recent episodes of chest pain, shortness of breath, palpitations, tachycardia, syncope, near syncope, dizziness.  Past Medical History:  Diagnosis Date   Anemia    Anxiety    no meds   Arthritis    hands, knees - no meds   Bilateral leg weakness    Complication of anesthesia    Difficult to arouse   Dental bridge present    perm lower front bottom bridge   Depression    no meds   Eyes swollen    and red - was seen by MD for possible allergy to new eye drops   GERD (gastroesophageal reflux  disease)    diet controlled - only takes med prn basis   Glaucoma    Hypercholesteremia    Hypertension    Migraine headache    Osteoporosis    SVD (spontaneous vaginal delivery)    x 3 - only one currently living   Vertigo     Past Surgical History:  Procedure Laterality Date   ABDOMINAL HYSTERECTOMY     ANAL FISSURE REPAIR  1970s   for bleeding in 1970s   ANTERIOR AND POSTERIOR REPAIR WITH SACROSPINOUS FIXATION N/A 05/30/2018   Procedure: ANTERIOR AND POSTERIOR REPAIR;  Surgeon: Candice Camp, MD;  Location: [facility];  Service: Gynecology;  Laterality: N/A;   bilateral cataracts removed Bilateral    COLONOSCOPY  2012   [facility]   COLONOSCOPY WITH PROPOFOL N/A 09/01/2016   Procedure: COLONOSCOPY WITH PROPOFOL;  Surgeon: Earline Mayotte, MD;  Location: [facility] END[facility];  Service: Endoscopy;  Laterality: N/A;   COLONOSCOPY WITH PROPOFOL N/A 09/10/2021   Procedure: COLONOSCOPY WITH PROPOFOL;  Surgeon: Earline Mayotte, MD;  Location: [facility];  Service: Endoscopy;  Laterality: N/A;   ESOPHAGOGASTRODUODENOSCOPY (EGD) WITH PROPOFOL N/A 01/05/2023   Procedure: ESOPHAGOGASTRODUODENOSCOPY (EGD) WITH PROPOFOL;  Surgeon: Toney Reil, MD;  Location: [facility] END[facility]COPY;  Service: Gastroenterology;  Laterality: N/A;   EYE SURGERY Bilateral    cataracts removed   KNEE SURGERY Left    SALPINGOOPHORECTOMY Right 05/30/2018   Procedure: SALPINGO OOPHORECTOMY;  Surgeon: Candice Camp, MD;  Location: [facility];  Service: Gynecology;  Laterality: Right;   SUBCLAVIAN ANGIOGRAM     TOTAL HIP ARTHROPLASTY Left 01/28/2021   Procedure: TOTAL HIP ARTHROPLASTY ANTERIOR APPROACH;  Surgeon: Kennedy Bucker, MD;  Location: [facility];  Service: Orthopedics;  Laterality: Left;   TOTAL KNEE ARTHROPLASTY Right 04/02/2020   Procedure: TOTAL KNEE ARTHROPLASTY;  Surgeon: Durene Romans, MD;  Location: [facility];  Service: Orthopedics;  Laterality: Right;  70 mins   UPPER GI ENDOSCOPY  2012   VAGINAL HYSTERECTOMY N/A  05/30/2018   Procedure: HYSTERECTOMY VAGINAL;  Surgeon: Candice Camp, MD;  Location: [facility];  Service: Gynecology;  Laterality: N/A;   WISDOM TOOTH EXTRACTION       Home Medications:  Prior to Admission medications   Medication Sig Start Date End Date Taking? Authorizing Provider  acetaminophen (TYLENOL) 500 MG tablet Take 500-1,000 mg by mouth every 6 (six) hours as needed for mild pain (pain score 1-3) or moderate pain (pain score 4-6).   Yes [provider]  brimonidine (ALPHAGAN) 0.2 % ophthalmic solution Place 1 drop into both eyes 3 (three) times daily.   Yes [provider]  buPROPion (WELLBUTRIN XL) 150 MG 24 hr tablet TAKE 1 TABLET (150 MG TOTAL) BY MOUTH IN THE MORNING 12/13/23  Yes Sowles, Danna Hefty, MD  celecoxib (CELEBREX) 100 MG capsule Take 1 capsule (100 mg total) by mouth 2 (two) times daily. Patient taking differently: Take 100 mg by mouth daily as needed for mild pain (pain score 1-3) or moderate pain (pain score 4-6). 06/25/23  Yes Sowles, Danna Hefty, MD  Cholecalciferol (VITAMIN D3) 50 MCG (2000 UT) capsule Take 1 capsule (2,000 Units total) by mouth daily. 06/25/23  Yes Sowles, Danna Hefty, MD  citalopram (CELEXA) 20 MG tablet TAKE 1 TABLET BY MOUTH EVERY DAY 12/08/23  Yes Sowles, Danna Hefty, MD  fluticasone (FLONASE) 50 MCG/ACT nasal spray SPRAY 2 SPRAYS INTO EACH NOSTRIL EVERY DAY Patient taking differently: Place 2 sprays into both nostrils daily as needed for allergies or rhinitis. 12/09/23  Yes Sowles, Danna Hefty, MD  Guaifenesin [facility] MAXIMUM STRENGTH) 1200 MG TB12 Take 1,200 mg by mouth 2 (two) times daily as needed (congestion).   Yes [provider]  latanoprost (XALATAN) 0.005 % ophthalmic solution Place 1 drop into both eyes at bedtime. 12/14/22  Yes [provider]  omeprazole (PRILOSEC) 40 MG capsule TAKE 1 CAPSULE (40 MG TOTAL) BY MOUTH DAILY. 12/13/23  Yes Sowles, Danna Hefty, MD  Polyvinyl Alcohol-Povidone (REFRESH OP) Place 2 drops into both eyes 3  (three) times daily as needed (dry eye).   Yes [provider]  pravastatin (PRAVACHOL) 20 MG tablet Take 1 tablet (20 mg total) by mouth daily. 06/25/23  Yes Sowles, Danna Hefty, MD  vitamin B-12 (CYANOCOBALAMIN) 500 MCG tablet Take 500 mcg by mouth daily.   Yes [provider]  ALPHAGAN P 0.1 % SOLN Place 1 drop into both eyes 3 (three) times daily.  Patient not taking: Reported on 12/29/2023 02/16/17   [provider]  PREMARIN vaginal cream Place 1 application vaginally 2 (two) times a week. Patient not taking: Reported on 12/29/2023    [provider]    Inpatient Medications: Scheduled Meds:  acetaminophen  1,000 mg Oral Q6H   aspirin  81 mg Oral BID   brimonidine  1 drop Both Eyes TID   buPROPion  150 mg Oral Daily   celecoxib  200 mg Oral BID   citalopram  20 mg Oral Daily   dexamethasone (DECADRON) injection  10  mg Intravenous Once   latanoprost  1 drop Both Eyes QHS   pantoprazole  40 mg Oral Daily   polyethylene glycol  17 g Oral BID   pravastatin  20 mg Oral Daily   senna  2 tablet Oral QHS   sodium chloride flush  3-10 mL Intravenous Q12H   Continuous Infusions:  PRN Meds: alum & mag hydroxide-simeth, bisacodyl, diphenhydrAMINE, HYDROmorphone (DILAUDID) injection, menthol-cetylpyridinium **OR** phenol, methocarbamol **OR** methocarbamol (ROBAXIN) injection, metoCLOPramide **OR** metoCLOPramide (REGLAN) injection, ondansetron **OR** ondansetron (ZOFRAN) IV, oxyCODONE, oxyCODONE, sodium chloride flush  Allergies:    Allergies  Allergen Reactions   Biphosphate     Unknown    Codeine     headaches   Combigan [Brimonidine Tartrate-Timolol]     drowsy   Sulfa Antibiotics Nausea And Vomiting and Nausea Only   Metronidazole Itching and Rash    Social History:   Social History   Socioeconomic History   Marital status: Widowed    Spouse name: Reita Cliche   Number of children: 3   Years of education: Not on file   Highest education level: 12th  grade  Occupational History    Employer: [employer]    Comment: retired  Tobacco Use   Smoking status: Never   Smokeless tobacco: Never   Tobacco comments:    smoking cessation materials not required  Vaping Use   Vaping status: Never Used  Substance and Sexual Activity   Alcohol use: Yes    Comment: Rare   Drug use: No   Sexual activity: Yes    Partners: Male    Birth control/protection: Surgical  Other Topics Concern   Not on file  Social History Narrative   Pt lives alone. Significant other passed away in Nov 27, 2020  Social Drivers of Health   Financial Resource Strain: Low Risk  (10/01/2023)   Overall Financial Resource Strain (CARDIA)    Difficulty of Paying Living Expenses: Not very hard  Food Insecurity: No Food Insecurity (01/12/2024)   Hunger Vital Sign    Worried About Running Out of Food in the Last Year: Never true    Ran Out of Food in the Last Year: Never true  Transportation Needs: No Transportation Needs (01/12/2024)   PRAPARE - Administrator, Civil Service (Medical): No    Lack of Transportation (Non-Medical): No  Physical Activity: Insufficiently Active (06/10/2023)   Exercise Vital Sign    Days of Exercise per Week: 2 days    Minutes of Exercise per Session: 30 min  Stress: No Stress Concern Present (10/01/2023)   [employer] of Occupational Health - Occupational Stress Questionnaire    Feeling of Stress : Not at all  Social Connections: Moderately Integrated (01/12/2024)   Social Connection and Isolation Panel [NHANES]    Frequency of Communication with Friends and Family: More than three times a week    Frequency of Social Gatherings with Friends and Family: More than three times a week    Attends Religious Services: 1 to 4 times per year    Active Member of [employer] or Organizations: Yes    Attends Banker Meetings: More than 4 times per year    Marital Status: Widowed  Intimate Partner Violence: Not At Risk (01/12/2024)    Humiliation, Afraid, Rape, and Kick questionnaire    Fear of Current or Ex-Partner: No    Emotionally Abused: No    Physically Abused: No    Sexually Abused: No    Family History:  Family History  Problem Relation Age of Onset   Cancer Mother 96       colon   Alzheimer's disease Mother    Hypertension Mother    Prostate cancer Father    Hypertension Father    Cerebral aneurysm Father    Diabetes Sister    Depression Sister    Hypertension Sister    Depression Sister    Depression Brother    Cancer Brother        prostate   Cancer Brother    Gallbladder disease Brother    Prostate cancer Brother    Lymphoma Son    Cancer Son 48       lymphoma   Breast cancer Neg Hx      ROS:  Please see the history of present illness.   All other ROS reviewed and negative.     Physical Exam/Data:   Vitals:   01/11/24 2015 01/11/24 2223 01/12/24 0146 01/12/24 0520  BP: (!) 142/61 (!) 141/78 106/69 133/65  Pulse: 72 79 76 71  Resp: 16 16 17 16   Temp: 97.7 F (36.5 C)  98.2 F (36.8 C) 97.7 F (36.5 C)  TempSrc: Oral  Oral Oral  SpO2: 94% 95% 92% 95%  Weight:      Height:        Intake/Output Summary (Last 24 hours) at 01/12/2024 0746 Last data filed at 01/12/2024 0520 Gross per 24 hour  Intake 900 ml  Output 2200 ml  Net -1300 ml      01/11/2024   10:05 AM 12/29/2023    1:17 PM 12/28/2023    9:21 AM  Last 3 Weights  Weight (lbs) 145 lb 145 lb 146 lb 4.8 oz  Weight (kg) 65.772 kg 65.772 kg 66.361 kg     Body mass index is 26.52 kg/m.  General:  Well nourished, well developed, in no acute distress. Sitting upright in the bed  HEENT: normal Neck: no JVD Vascular: Radial pulses 2+ bilaterally Cardiac:  normal S1, S2; RRR; no murmur   Lungs:  clear to auscultation bilaterally, no wheezing, rhonchi or rales. Normal WOB on room air  Abd: soft, nontender Ext: no edema in BLE Musculoskeletal: Left knee is in a splint and wrapped  Skin: warm and dry  Neuro:  no focal  abnormalities noted Psych:  Normal affect   EKG:  The EKG was personally reviewed and demonstrates:  EKG from 2/11 showed sinus rhythm with occasional PVCs, heart rate 76 bpm. Telemetry:  Telemetry was personally reviewed and demonstrates:  NSR, HR in the 60s-70s. Rare PVCs   Relevant CV Studies:   Laboratory Data:  High Sensitivity Troponin:  No results for input(s): "TROPONINIHS" in the last 720 hours.   Chemistry Recent Labs  Lab 01/12/24 0347  NA 137  K 3.9  CL 104  CO2 24  GLUCOSE 136*  BUN 16  CREATININE 0.59  CALCIUM 8.1*  GFRNONAA >60  ANIONGAP 9    No results for input(s): "PROT", "ALBUMIN", "AST", "ALT", "ALKPHOS", "BILITOT" in the last 168 hours. Lipids No results for input(s): "CHOL", "TRIG", "HDL", "LABVLDL", "LDLCALC", "CHOLHDL" in the last 168 hours.  Hematology Recent Labs  Lab 01/12/24 0347  WBC 10.1  RBC 3.75*  HGB 10.7*  HCT 33.8*  MCV 90.1  MCH 28.5  MCHC 31.7  RDW 13.2  PLT 221   Thyroid No results for input(s): "TSH", "FREET4" in the last 168 hours.  BNPNo results for input(s): "BNP", "PROBNP" in the last 168  hours.  DDimer No results for input(s): "DDIMER" in the last 168 hours.   Radiology/Studies:  No results found.   Assessment and Plan:   Tachycardia  -While undergoing left total knee replacement yesterday, patient became tachycardic with heart rate to the 140s.  EKG strip reportedly showed SVT versus atrial fibrillation.  There are no tracings available for review in the chart  -Patient denies personal cardiac history.  Denies episodes of palpitations, chest pain, shortness of breath, syncope, near syncope, dizziness.  Denies fluttering in her chest or feelings of racing heartbeat. -Per telemetry, patient has been maintaining normal sinus rhythm with heart rate in the 60s.  Only rare PVCs noted -Anticipate cardiac monitor at discharge with outpatient follow up  - Dr. Wyline Mood to see later this AM   HLD  - Lipid panel from 06/2023  showed LDL 76, HDL 74, triglycerides 94, total cholesterol 168  - continue pravastatin 20 mg daily    Risk Assessment/Risk Scores:   For questions or updates, please contact [facility] Please consult [email or web address] for contact info under    Signed, Jonita Albee, PA-C  01/12/2024 7:46 AM

## 2024-01-14 DIAGNOSIS — M1712 Unilateral primary osteoarthritis, left knee: Secondary | ICD-10-CM | POA: Diagnosis not present

## 2024-01-14 DIAGNOSIS — M25562 Pain in left knee: Secondary | ICD-10-CM | POA: Diagnosis not present

## 2024-01-18 DIAGNOSIS — M25562 Pain in left knee: Secondary | ICD-10-CM | POA: Diagnosis not present

## 2024-01-18 DIAGNOSIS — M1712 Unilateral primary osteoarthritis, left knee: Secondary | ICD-10-CM | POA: Diagnosis not present

## 2024-01-18 NOTE — Discharge Summary (Signed)
 [name] MRN: [id number] DOB/AGE: [birth year] 83 y.o.  Admit date: 01/11/2024 Discharge date: 01/12/2024  Admission Diagnoses:  Left knee osteoarthritis  Discharge Diagnoses:  Principal Problem:   S/P total knee arthroplasty, left   Past Medical History:  Diagnosis Date   Anemia    Anxiety    no meds   Arthritis    hands, knees - no meds   Bilateral leg weakness    Complication of anesthesia    Difficult to arouse   Dental bridge present    perm lower front bottom bridge   Depression    no meds   Eyes swollen    and red - was seen by MD for possible allergy to new eye drops   GERD (gastroesophageal reflux disease)    diet controlled - only takes med prn basis   Glaucoma    Hypercholesteremia    Hypertension    Migraine headache    Osteoporosis    SVD (spontaneous vaginal delivery)    x 3 - only one currently living   Vertigo     Surgeries: Procedure(s): TOTAL KNEE ARTHROPLASTY on 01/11/2024   Consultants:   Discharged Condition: Improved  Hospital Course: [name] is an 83 y.o. female who was admitted 01/11/2024 for operative treatment ofS/P total knee arthroplasty, left. Patient has severe unremitting pain that affects sleep, daily activities, and work/hobbies. After pre-op clearance the patient was taken to the operating room on 01/11/2024 and underwent  Procedure(s): TOTAL KNEE ARTHROPLASTY.    Patient was given perioperative antibiotics:  Anti-infectives (From admission, onward)    Start     Dose/Rate Route Frequency Ordered Stop   01/11/24 1900  ceFAZolin (ANCEF) IVPB 2g/100 mL premix        2 g 200 mL/hr over 30 Minutes Intravenous Every 6 hours 01/11/24 1734 01/12/24 0006   01/11/24 1000  ceFAZolin (ANCEF) IVPB 2g/100 mL premix        2 g 200 mL/hr over 30 Minutes Intravenous On call to O.R. 01/11/24 0959 01/11/24 1245        Patient was given sequential compression devices, early ambulation, and chemoprophylaxis to  prevent DVT. Patient worked with PT and was meeting their goals regarding safe ambulation and transfers.  Patient benefited maximally from hospital stay and there were no complications.    Recent vital signs: No data found.   Recent laboratory studies: No results for input(s): "WBC", "HGB", "HCT", "PLT", "NA", "K", "CL", "CO2", "BUN", "CREATININE", "GLUCOSE", "INR", "CALCIUM" in the last 72 hours.  Invalid input(s): "PT", "2"   Discharge Medications:   Allergies as of 01/12/2024       Reactions   Biphosphate    Unknown    Codeine    headaches   Combigan [brimonidine Tartrate-timolol]    drowsy   Sulfa Antibiotics Nausea And Vomiting, Nausea Only   Metronidazole Itching, Rash        Medication List     STOP taking these medications    Premarin vaginal cream Generic drug: conjugated estrogens       TAKE these medications    acetaminophen 500 MG tablet Commonly known as: TYLENOL Take 500-1,000 mg by mouth every 6 (six) hours as needed for mild pain (pain score 1-3) or moderate pain (pain score 4-6).   aspirin 81 MG chewable tablet Chew 1 tablet (81 mg total) by mouth 2 (two) times daily for 28 days.   brimonidine 0.2 % ophthalmic solution Commonly known as: ALPHAGAN Place 1  drop into both eyes 3 (three) times daily. What changed: Another medication with the same name was removed. Continue taking this medication, and follow the directions you see here.   buPROPion 150 MG 24 hr tablet Commonly known as: WELLBUTRIN XL TAKE 1 TABLET (150 MG TOTAL) BY MOUTH IN THE MORNING   celecoxib 100 MG capsule Commonly known as: CELEBREX Take 1 capsule (100 mg total) by mouth 2 (two) times daily. What changed:  when to take this reasons to take this   citalopram 20 MG tablet Commonly known as: CELEXA TAKE 1 TABLET BY MOUTH EVERY DAY   fluticasone 50 MCG/ACT nasal spray Commonly known as: FLONASE SPRAY 2 SPRAYS INTO EACH NOSTRIL EVERY DAY What changed: See the new  instructions.   latanoprost 0.005 % ophthalmic solution Commonly known as: XALATAN Place 1 drop into both eyes at bedtime.   methocarbamol 500 MG tablet Commonly known as: ROBAXIN Take 1 tablet (500 mg total) by mouth every 6 (six) hours as needed for muscle spasms.   Mucinex Maximum Strength 1200 MG Tb12 Generic drug: Guaifenesin Take 1,200 mg by mouth 2 (two) times daily as needed (congestion).   omeprazole 40 MG capsule Commonly known as: PRILOSEC TAKE 1 CAPSULE (40 MG TOTAL) BY MOUTH DAILY.   oxyCODONE 5 MG immediate release tablet Commonly known as: Oxy IR/ROXICODONE Take 1 tablet (5 mg total) by mouth every 4 (four) hours as needed for severe pain (pain score 7-10).   polyethylene glycol 17 g packet Commonly known as: MIRALAX / GLYCOLAX Take 17 g by mouth 2 (two) times daily.   pravastatin 20 MG tablet Commonly known as: PRAVACHOL Take 1 tablet (20 mg total) by mouth daily.   REFRESH OP Place 2 drops into both eyes 3 (three) times daily as needed (dry eye).   senna 8.6 MG Tabs tablet Commonly known as: SENOKOT Take 2 tablets (17.2 mg total) by mouth at bedtime for 14 days.   vitamin B-12 500 MCG tablet Commonly known as: CYANOCOBALAMIN Take 500 mcg by mouth daily.   Vitamin D3 50 MCG (2000 UT) capsule Take 1 capsule (2,000 Units total) by mouth daily.               Discharge Care Instructions  (From admission, onward)           Start     Ordered   01/12/24 0000  Change dressing       Comments: Maintain surgical dressing until follow up in the clinic. If the edges start to pull up, may reinforce with tape. If the dressing is no longer working, may remove and cover with gauze and tape, but must keep the area dry and clean.  Call with any questions or concerns.   01/12/24 0835            Diagnostic Studies: No results found.  Disposition: Discharge disposition: 01-Home or Self Care       Discharge Instructions     Call MD / Call 911    Complete by: As directed    If you experience chest pain or shortness of breath, CALL 911 and be transported to the hospital emergency room.  If you develope a fever above 101 F, pus (white drainage) or increased drainage or redness at the wound, or calf pain, call your surgeon's office.   Change dressing   Complete by: As directed    Maintain surgical dressing until follow up in the clinic. If the edges start to pull up, may  reinforce with tape. If the dressing is no longer working, may remove and cover with gauze and tape, but must keep the area dry and clean.  Call with any questions or concerns.   Constipation Prevention   Complete by: As directed    Drink plenty of fluids.  Prune juice may be helpful.  You may use a stool softener, such as Colace (over the counter) 100 mg twice a day.  Use MiraLax (over the counter) for constipation as needed.   Diet - low sodium heart healthy   Complete by: As directed    Increase activity slowly as tolerated   Complete by: As directed    Weight bearing as tolerated with assist device (walker, cane, etc) as directed, use it as long as suggested by your surgeon or therapist, typically at least 4-6 weeks.   Post-operative opioid taper instructions:   Complete by: As directed    POST-OPERATIVE OPIOID TAPER INSTRUCTIONS: It is important to wean off of your opioid medication as soon as possible. If you do not need pain medication after your surgery it is ok to stop day one. Opioids include: Codeine, Hydrocodone(Norco, Vicodin), Oxycodone(Percocet, oxycontin) and hydromorphone amongst others.  Long term and even short term use of opiods can cause: Increased pain response Dependence Constipation Depression Respiratory depression And more.  Withdrawal symptoms can include Flu like symptoms Nausea, vomiting And more Techniques to manage these symptoms Hydrate well Eat regular healthy meals Stay active Use relaxation techniques(deep breathing,  meditating, yoga) Do Not substitute Alcohol to help with tapering If you have been on opioids for less than two weeks and do not have pain than it is ok to stop all together.  Plan to wean off of opioids This plan should start within one week post op of your joint replacement. Maintain the same interval or time between taking each dose and first decrease the dose.  Cut the total daily intake of opioids by one tablet each day Next start to increase the time between doses. The last dose that should be eliminated is the evening dose.      TED hose   Complete by: As directed    Use stockings (TED hose) for 2 weeks on both leg(s).  You may remove them at night for sleeping.        Follow-up Information     Cassandria Anger, PA-C. Go on 01/26/2024.   Specialty: Orthopedic Surgery Why: You are scheduled for a follow up appointment on 01-26-24 at 11:00 am. Contact information: [address] [place] [address] [phone]                  Signed: Cassandria Anger 01/18/2024, 4:14 PM

## 2024-01-21 DIAGNOSIS — M1712 Unilateral primary osteoarthritis, left knee: Secondary | ICD-10-CM | POA: Diagnosis not present

## 2024-01-21 DIAGNOSIS — M25562 Pain in left knee: Secondary | ICD-10-CM | POA: Diagnosis not present

## 2024-01-25 ENCOUNTER — Ambulatory Visit (INDEPENDENT_AMBULATORY_CARE_PROVIDER_SITE_OTHER): Payer: Medicare HMO | Admitting: Family Medicine

## 2024-01-25 ENCOUNTER — Encounter: Payer: Self-pay | Admitting: Family Medicine

## 2024-01-25 VITALS — BP 122/78 | HR 77 | Resp 16 | Ht 62.0 in | Wt 143.5 lb

## 2024-01-25 DIAGNOSIS — G8929 Other chronic pain: Secondary | ICD-10-CM

## 2024-01-25 DIAGNOSIS — D62 Acute posthemorrhagic anemia: Secondary | ICD-10-CM

## 2024-01-25 DIAGNOSIS — R739 Hyperglycemia, unspecified: Secondary | ICD-10-CM | POA: Diagnosis not present

## 2024-01-25 DIAGNOSIS — M810 Age-related osteoporosis without current pathological fracture: Secondary | ICD-10-CM

## 2024-01-25 DIAGNOSIS — Z96652 Presence of left artificial knee joint: Secondary | ICD-10-CM | POA: Diagnosis not present

## 2024-01-25 DIAGNOSIS — F339 Major depressive disorder, recurrent, unspecified: Secondary | ICD-10-CM | POA: Diagnosis not present

## 2024-01-25 DIAGNOSIS — I479 Paroxysmal tachycardia, unspecified: Secondary | ICD-10-CM

## 2024-01-25 DIAGNOSIS — E785 Hyperlipidemia, unspecified: Secondary | ICD-10-CM

## 2024-01-25 DIAGNOSIS — K219 Gastro-esophageal reflux disease without esophagitis: Secondary | ICD-10-CM

## 2024-01-25 DIAGNOSIS — M545 Low back pain, unspecified: Secondary | ICD-10-CM | POA: Diagnosis not present

## 2024-01-25 MED ORDER — CITALOPRAM HYDROBROMIDE 10 MG PO TABS: 10.0000 mg | ORAL_TABLET | Freq: Every day | ORAL | 1 refills | Status: DC

## 2024-01-25 NOTE — Progress Notes (Addendum)
 Name: [name]   MRN: [id number]    DOB: [birth year]   Date:01/25/2024       Progress Note  Subjective  Chief Complaint  Chief Complaint  Patient presents with   Medical Management of Chronic Issues   HPI   Depression Major Chronic: she still misses her son , that died at age 84 from lymphoma back in 02-11-2013.( Her husband died in 02/11/2009  on the same day on October 14 th). She has two grandchildren from him and two other grandchildren from her youngest son. She states she really struggles from October until the end of December. Phq 9  was 10, we tried Celexa 10 mg May 2023  and she states feeling better but still had lack of motivation, we added wellbutrin in July  2023 and she noticed improvement of motivation, we increased celexa to 20 mg during winter months when symptoms are worse. She wants to go down on the dose around Easter   Paroxysmal Tachycardia: during her recent hospital stay, she is wearing a zio patch, no chest pain or sob. Denies palpitation    GERD: she was having dysphagia and choking, but seen by Dr. Allegra Lai and EGD was normal except for mild hiatal hernia, she is taking PPI, does not want to have further testing at this time ( Dr. Allegra Lai advised manometry) , she notices recurrence of symptoms when she skips medication. Stable  Anemia due to blood loss after surgery , advised to start ferrous sulfate 325 mg daily with a little bit of orange juice    Gait instability: she had left hip, right knee and now left replaced, getting PT, at home does not use walker, but has been using when going out the house  Dyslipidemia: LDL is at goal down to 76 , continue pravastatin    Senile purpura: both arms, stable and reassurance given again   History of left knee total arthroplasty on Feb 25 th and is doing well however today she forgot to take Tylenol at lunch and the pain is intense right now She is going to PT. Pain right now is 7/10    Osteoporosis: she is under the care of Dr.  Tedd Sias and will have another Reclast infusion .  Last visit with Dr. Tedd Sias was July 2024 , bone density improved on spine but worse on hip. She will go back for another infusion soon   Chronic back pain: no radiculitis, she is doing well at this time. She has robaxin at home    Patient Active Problem List   Diagnosis Date Noted   S/P total knee arthroplasty, left 01/11/2024   Multiple thyroid nodules 04/09/2023   Impingement syndrome of right shoulder region 04/07/2023   Esophageal dysphagia 01/05/2023   Hiatal hernia 01/05/2023   Menopausal osteoporosis 04/08/2022   B12 deficiency 04/08/2022   History of total left hip replacement 04/08/2022   Gait instability 04/08/2022   Impaired fasting glucose    Overweight (BMI 25.0-29.9) 04/03/2020   Status post total knee replacement, right 04/02/2020   Presence of right artificial knee joint 04/02/2020   Senile purpura (HCC) 12/30/2018   S/P vaginal hysterectomy 05/30/2018   Major depression, recurrent, chronic (HCC) 03/02/2018   Gastroesophageal reflux disease without esophagitis 10/20/2016   Anxiety, generalized 01/28/2016   Abnormal brain CT 06/20/2015   Dyslipidemia 06/20/2015   Vertigo 06/20/2015   History of hypertension 06/20/2015   Arthralgia of shoulder 06/20/2015   Glaucoma 06/20/2015    Past Surgical History:  Procedure Laterality Date   ABDOMINAL HYSTERECTOMY     ANAL FISSURE REPAIR  1970s   for bleeding in 1970s   ANTERIOR AND POSTERIOR REPAIR WITH SACROSPINOUS FIXATION N/A 05/30/2018   Procedure: ANTERIOR AND POSTERIOR REPAIR;  Surgeon: Candice Camp, MD;  Location: [facility];  Service: Gynecology;  Laterality: N/A;   bilateral cataracts removed Bilateral    COLONOSCOPY  2012   [facility]   COLONOSCOPY WITH PROPOFOL N/A 09/01/2016   Procedure: COLONOSCOPY WITH PROPOFOL;  Surgeon: Earline Mayotte, MD;  Location: [facility] END[facility];  Service: Endoscopy;  Laterality: N/A;   COLONOSCOPY WITH PROPOFOL N/A 09/10/2021   Procedure:  COLONOSCOPY WITH PROPOFOL;  Surgeon: Earline Mayotte, MD;  Location: [facility];  Service: Endoscopy;  Laterality: N/A;   ESOPHAGOGASTRODUODENOSCOPY (EGD) WITH PROPOFOL N/A 01/05/2023   Procedure: ESOPHAGOGASTRODUODENOSCOPY (EGD) WITH PROPOFOL;  Surgeon: Toney Reil, MD;  Location[facility];  Service: Gastroenterology;  Laterality: N/A;   EYE SURGERY Bilateral    cataracts removed   KNEE SURGERY Left    SALPINGOOPHORECTOMY Right 05/30/2018   Procedure: SALPINGO OOPHORECTOMY;  Surgeon: Candice Camp, MD;  Location: [facility];  Service: Gynecology;  Laterality: Right;   SUBCLAVIAN ANGIOGRAM     TOTAL HIP ARTHROPLASTY Left 01/28/2021   Procedure: TOTAL HIP ARTHROPLASTY ANTERIOR APPROACH;  Surgeon: Kennedy Bucker, MD;  Location: [facility];  Service: Orthopedics;  Laterality: Left;   TOTAL KNEE ARTHROPLASTY Right 04/02/2020   Procedure: TOTAL KNEE ARTHROPLASTY;  Surgeon: Durene Romans, MD;  Location: [facility];  Service: Orthopedics;  Laterality: Right;  70 mins   TOTAL KNEE ARTHROPLASTY Left 01/11/2024   Procedure: TOTAL KNEE ARTHROPLASTY;  Surgeon: Durene Romans, MD;  Location: [facility];  Service: Orthopedics;  Laterality: Left;  70 mins   UPPER GI ENDOSCOPY  2012   VAGINAL HYSTERECTOMY N/A 05/30/2018   Procedure: HYSTERECTOMY VAGINAL;  Surgeon: Candice Camp, MD;  Location: [facility];  Service: Gynecology;  Laterality: N/A;   WISDOM TOOTH EXTRACTION      Family History  Problem Relation Age of Onset   Cancer Mother 13       colon   Alzheimer's disease Mother    Hypertension Mother    Prostate cancer Father    Hypertension Father    Cerebral aneurysm Father    Diabetes Sister    Depression Sister    Hypertension Sister    Depression Sister    Depression Brother    Cancer Brother        prostate   Cancer Brother    Gallbladder disease Brother    Prostate cancer Brother    Lymphoma Son    Cancer Son 48       lymphoma   Breast cancer Neg Hx     Social History   Tobacco Use    Smoking status: Never   Smokeless tobacco: Never   Tobacco comments:    smoking cessation materials not required  Substance Use Topics   Alcohol use: Yes    Comment: Rare     Current Outpatient Medications:    acetaminophen (TYLENOL) 500 MG tablet, Take 500-1,000 mg by mouth every 6 (six) hours as needed for mild pain (pain score 1-3) or moderate pain (pain score 4-6)., Disp: , Rfl:    aspirin 81 MG chewable tablet, Chew 1 tablet (81 mg total) by mouth 2 (two) times daily for 28 days., Disp: 56 tablet, Rfl: 0   brimonidine (ALPHAGAN) 0.2 % ophthalmic solution, Place 1 drop into both eyes 3 (three) times  daily., Disp: , Rfl:    buPROPion (WELLBUTRIN XL) 150 MG 24 hr tablet, TAKE 1 TABLET (150 MG TOTAL) BY MOUTH IN THE MORNING, Disp: 90 tablet, Rfl: 1   celecoxib (CELEBREX) 100 MG capsule, Take 1 capsule (100 mg total) by mouth 2 (two) times daily., Disp: 60 capsule, Rfl: 0   Cholecalciferol (VITAMIN D3) 50 MCG (2000 UT) capsule, Take 1 capsule (2,000 Units total) by mouth daily., Disp: 100 capsule, Rfl: 1   citalopram (CELEXA) 20 MG tablet, TAKE 1 TABLET BY MOUTH EVERY DAY, Disp: 90 tablet, Rfl: 1   fluticasone (FLONASE) 50 MCG/ACT nasal spray, SPRAY 2 SPRAYS INTO EACH NOSTRIL EVERY DAY (Patient taking differently: Place 2 sprays into both nostrils daily as needed for allergies or rhinitis.), Disp: 48 mL, Rfl: 0   Guaifenesin (MUCINEX MAXIMUM STRENGTH) 1200 MG TB12, Take 1,200 mg by mouth 2 (two) times daily as needed (congestion)., Disp: , Rfl:    latanoprost (XALATAN) 0.005 % ophthalmic solution, Place 1 drop into both eyes at bedtime., Disp: , Rfl:    methocarbamol (ROBAXIN) 500 MG tablet, Take 1 tablet (500 mg total) by mouth every 6 (six) hours as needed for muscle spasms., Disp: 40 tablet, Rfl: 2   omeprazole (PRILOSEC) 40 MG capsule, TAKE 1 CAPSULE (40 MG TOTAL) BY MOUTH DAILY., Disp: 90 capsule, Rfl: 1   oxyCODONE (OXY IR/ROXICODONE) 5 MG immediate release tablet, Take 1 tablet (5 mg  total) by mouth every 4 (four) hours as needed for severe pain (pain score 7-10)., Disp: 42 tablet, Rfl: 0   polyethylene glycol (MIRALAX / GLYCOLAX) 17 g packet, Take 17 g by mouth 2 (two) times daily., Disp: 14 each, Rfl: 0   Polyvinyl Alcohol-Povidone (REFRESH OP), Place 2 drops into both eyes 3 (three) times daily as needed (dry eye)., Disp: , Rfl:    pravastatin (PRAVACHOL) 20 MG tablet, Take 1 tablet (20 mg total) by mouth daily., Disp: 90 tablet, Rfl: 2   senna (SENOKOT) 8.6 MG TABS tablet, Take 2 tablets (17.2 mg total) by mouth at bedtime for 14 days., Disp: 28 tablet, Rfl: 0   vitamin B-12 (CYANOCOBALAMIN) 500 MCG tablet, Take 500 mcg by mouth daily., Disp: , Rfl:   Allergies  Allergen Reactions   Biphosphate     Unknown    Codeine     headaches   Combigan [Brimonidine Tartrate-Timolol]     drowsy   Sulfa Antibiotics Nausea And Vomiting and Nausea Only   Metronidazole Itching and Rash    I personally reviewed active problem list, medication list, allergies, family history with the patient/caregiver today.   ROS  Ten systems reviewed and is negative except as mentioned in HPI    Objective  Vitals:   01/25/24 1525  BP: 122/78  Pulse: 77  Resp: 16  Weight: 143 lb 8 oz (65.1 kg)  Height: 5\' 2"  (1.575 m)    Body mass index is 26.25 kg/m.  Physical Exam  Constitutional: Patient appears well-developed and well-nourished.  No distress.  HEENT: head atraumatic, normocephalic, pupils equal and reactive to light, neck supple Cardiovascular: Normal rate, regular rhythm and normal heart sounds.  No murmur heard. No BLE edema. Pulmonary/Chest: Effort normal and breath sounds normal. No respiratory distress. Abdominal: Soft.  There is no tenderness. Muscular skeletal: flexion is not at 90 degrees yet, using  a walker today  Psychiatric: Patient has a normal mood and affect. behavior is normal. Judgment and thought content normal.   Recent Results (from the past 2160  hours)  Surgical pcr screen     Status: None   Collection Time: 12/29/23  2:13 PM   Specimen: Nasal Mucosa; Nasal Swab  Result Value Ref Range   MRSA, PCR NEGATIVE NEGATIVE   Staphylococcus aureus NEGATIVE NEGATIVE    Comment: (NOTE) The Xpert SA Assay (FDA approved for NASAL specimens in patients 46 years of age and older), is one component of a comprehensive surveillance program. It is not intended to diagnose infection nor to guide or monitor treatment. Performed at [facility], [address] [place], [place] [address]   CBC per protocol     Status: None   Collection Time: 12/29/23  2:13 PM  Result Value Ref Range   WBC 8.3 4.0 - 10.5 K/uL   RBC 4.63 3.87 - 5.11 MIL/uL   Hemoglobin 12.9 12.0 - 15.0 g/dL   HCT 29.5 62.1 - 30.8 %   MCV 91.4 80.0 - 100.0 fL   MCH 27.9 26.0 - 34.0 pg   MCHC 30.5 30.0 - 36.0 g/dL   RDW 65.7 84.6 - 96.2 %   Platelets 250 150 - 400 K/uL   nRBC 0.0 0.0 - 0.2 %    Comment: Performed at [facility], [address], [place], [place] [address]  CBC     Status: Abnormal   Collection Time: 01/12/24  3:47 AM  Result Value Ref Range   WBC 10.1 4.0 - 10.5 K/uL   RBC 3.75 (L) 3.87 - 5.11 MIL/uL   Hemoglobin 10.7 (L) 12.0 - 15.0 g/dL   HCT 13.2 (L) 44.0 - 10.2 %   MCV 90.1 80.0 - 100.0 fL   MCH 28.5 26.0 - 34.0 pg   MCHC 31.7 30.0 - 36.0 g/dL   RDW 72.5 36.6 - 44.0 %   Platelets 221 150 - 400 K/uL   nRBC 0.0 0.0 - 0.2 %    Comment: Performed at [facility], [address], [place], [place] [address]  Basic metabolic panel     Status: Abnormal   Collection Time: 01/12/24  3:47 AM  Result Value Ref Range   Sodium 137 135 - 145 mmol/L   Potassium 3.9 3.5 - 5.1 mmol/L   Chloride 104 98 - 111 mmol/L   CO2 24 22 - 32 mmol/L   Glucose, Bld 136 (H) 70 - 99 mg/dL    Comment: Glucose reference range applies only to samples taken after fasting for at least 8 hours.   BUN 16 8 - 23 mg/dL    Creatinine, Ser 5.95 0.44 - 1.00 mg/dL   Calcium 8.1 (L) 8.9 - 10.3 mg/dL   GFR, Estimated >63 >87 mL/min    Comment: (NOTE) Calculated using the CKD-EPI Creatinine Equation (2021)    Anion gap 9 5 - 15    Comment: Performed at [facility], [address], [place], [place] [address]    Diabetic Foot Exam:     PHQ2/9:    01/25/2024    3:24 PM 12/28/2023    9:20 AM 10/01/2023    3:50 PM 08/26/2023    3:16 PM 06/25/2023    1:54 PM  Depression screen PHQ 2/9  Decreased Interest 0 1 0 0 1  Down, Depressed, Hopeless 0 1 0 0 0  PHQ - 2 Score 0 2 0 0 1  Altered sleeping 0 1   0  Tired, decreased energy 0 0   0  Change in appetite 0 0   0  Feeling bad or failure about yourself  0 0   0  Trouble concentrating 0 1   0  Moving slowly or fidgety/restless 0 0   0  Suicidal thoughts 0 0   0  PHQ-9 Score 0 4   1  Difficult doing work/chores Not difficult at all Somewhat difficult       phq 9 is negative  Fall Risk:    12/28/2023    9:16 AM 10/01/2023    3:48 PM 08/26/2023    3:15 PM 06/25/2023    1:54 PM 06/10/2023    1:44 PM  Fall Risk   Falls in the past year? 0 1 0 1 1  Number falls in past yr: 0 0 0 0 0  Injury with Fall? 0 0 0 0 0  Comment     Followed by medical attention  Risk for fall due to : No Fall Risks Medication side effect;Orthopedic patient No Fall Risks No Fall Risks No Fall Risks  Follow up Falls prevention discussed;Education provided;Falls evaluation completed Falls prevention discussed Falls prevention discussed Falls prevention discussed Falls prevention discussed     Assessment & Plan  1. Major depression, recurrent, chronic (HCC) (Primary)  - citalopram (CELEXA) 10 MG tablet; Take 1 tablet (10 mg total) by mouth daily.  Dispense: 90 tablet; Refill: 1  2. Age-related osteoporosis without current pathological fracture  Keep visits with Endo  3. Chronic midline low back pain without sciatica  May take Robaxin  4.  Dyslipidemia  Continue statin therapy   5. Hyperglycemia  Discussed low carbohydrate diet  6. GERD without esophagitis  Doing well on medication   7. S/P total knee arthroplasty, left  Keep PT and follow up with Ortho   8. Paroxysmal tachycardia (HCC)  Wearing Zio patch    9. Anemia due to blood loss, acute   Recheck labs next visit, take ferrous sulfate 325 mg daily

## 2024-01-26 DIAGNOSIS — M1712 Unilateral primary osteoarthritis, left knee: Secondary | ICD-10-CM | POA: Diagnosis not present

## 2024-01-26 DIAGNOSIS — M25562 Pain in left knee: Secondary | ICD-10-CM | POA: Diagnosis not present

## 2024-01-28 DIAGNOSIS — M1712 Unilateral primary osteoarthritis, left knee: Secondary | ICD-10-CM | POA: Diagnosis not present

## 2024-01-28 DIAGNOSIS — M25562 Pain in left knee: Secondary | ICD-10-CM | POA: Diagnosis not present

## 2024-01-31 DIAGNOSIS — M1712 Unilateral primary osteoarthritis, left knee: Secondary | ICD-10-CM | POA: Diagnosis not present

## 2024-01-31 DIAGNOSIS — M25562 Pain in left knee: Secondary | ICD-10-CM | POA: Diagnosis not present

## 2024-02-02 DIAGNOSIS — M25562 Pain in left knee: Secondary | ICD-10-CM | POA: Diagnosis not present

## 2024-02-02 DIAGNOSIS — M1712 Unilateral primary osteoarthritis, left knee: Secondary | ICD-10-CM | POA: Diagnosis not present

## 2024-02-02 NOTE — Progress Notes (Deleted)
  Cardiology Office Note:  .   Date:  02/02/2024  ID:  [name], DOB 03-Apr-1941, MRN [id number] PCP: Alba Cory, MD  [facility] Providers Cardiologist:  Maisie Fus, MD   History of Present Illness: .   [name] is a 83 y.o. female with a hx of osteoarthritis, esophageal dysphagia, hiatal hernia, depression, GERD, anxiety, hyperlipidemia. She recently established care with cardiology while admitted after a knee replacement, presents today for a hospital follow up appointment   She presented to [facility] on 2/25 for scheduled knee surgery.  During surgery, patient reportedly became tachycardic to the 140s.  EKG strip reportedly showed atrial fibrillation vs SVT.  There are no tracings in the chart available for review.  EKG from 12/28/2023 showed sinus rhythm with occasional PVCs, heart rate 76 bpm. Cardiology was consulted. On interview, patient denied having any cardiac history.  Reported that over 30 years ago, she saw cardiologist, completed a stress test and wore a cardiac monitor.  Both studies were reportedly normal.  She denied recent episodes of chest pain, shortness of breath, palpitations, tachycardia, syncope, near syncope, dizziness. Telemetry did not show any abnormal rhythms. She was discharged with a cardiac monitor that showed ***  Tachycardia  -While undergoing left total knee replacement  on 2/25, patient became tachycardic with heart rate to the 140s.  EKG strip reportedly showed SVT versus atrial fibrillation.  There are no tracings available for review in the chart. Patient denied personal cardiac history.  Denied having any episodes of palpitations, chest pain, shortness of breath, syncope, near syncope, dizziness.  Denied  fluttering in her chest or feelings of racing heartbeat. - On telemetry that admission,  patient maintained normal sinus rhythm with heart rate in the 60s.  Only rare PVCs noted -  HLD  - Lipid panel from  06/2023 showed LDL 76, HDL 74, triglycerides 94, total cholesterol 168  - continue pravastatin 20 mg daily   ROS: ***  Studies Reviewed: .        *** Risk Assessment/Calculations:   {Does this patient have ATRIAL FIBRILLATION?[phone]} No BP recorded.  {Refresh Note OR Click here to enter BP  :1}***       Physical Exam:   VS:  There were no vitals taken for this visit.   Wt Readings from Last 3 Encounters:  01/25/24 143 lb 8 oz (65.1 kg)  01/11/24 145 lb (65.8 kg)  12/29/23 145 lb (65.8 kg)    GEN: Well nourished, well developed in no acute distress NECK: No JVD; No carotid bruits CARDIAC: ***RRR, no murmurs, rubs, gallops RESPIRATORY:  Clear to auscultation without rales, wheezing or rhonchi  ABDOMEN: Soft, non-tender, non-distended EXTREMITIES:  No edema; No deformity   ASSESSMENT AND PLAN: .   ***    {Are you ordering a CV Procedure (e.g. stress test, cath, DCCV, TEE, etc)?   Press F2        :[id number]}  Dispo: ***  Signed, Jonita Albee, PA-C

## 2024-02-07 DIAGNOSIS — H401132 Primary open-angle glaucoma, bilateral, moderate stage: Secondary | ICD-10-CM | POA: Diagnosis not present

## 2024-02-08 DIAGNOSIS — M1712 Unilateral primary osteoarthritis, left knee: Secondary | ICD-10-CM | POA: Diagnosis not present

## 2024-02-08 DIAGNOSIS — M25562 Pain in left knee: Secondary | ICD-10-CM | POA: Diagnosis not present

## 2024-02-11 ENCOUNTER — Ambulatory Visit: Payer: Medicare HMO | Admitting: Cardiology

## 2024-02-11 DIAGNOSIS — M1712 Unilateral primary osteoarthritis, left knee: Secondary | ICD-10-CM | POA: Diagnosis not present

## 2024-02-11 DIAGNOSIS — M25562 Pain in left knee: Secondary | ICD-10-CM | POA: Diagnosis not present

## 2024-02-14 ENCOUNTER — Encounter (HOSPITAL_BASED_OUTPATIENT_CLINIC_OR_DEPARTMENT_OTHER): Payer: Self-pay

## 2024-02-14 NOTE — Progress Notes (Deleted)
   Cardiology Office Note    Date:  02/14/2024  ID:  JEILANI GRUPE, DOB 10/24/1941, MRN [id number] PCP:  Alba Cory, MD  Cardiologist:  Maisie Fus, MD  Electrophysiologist:  None   Chief Complaint: ***  History of Present Illness: .    [name] is a 83 y.o. female with visit-pertinent history of osteoarthritis, esophageal dysphagia, hiatal hernia, depression, GERD, anxiety, hyperlipidemia. She recently established care with cardiology while admitted after a knee replacement, presents today for a hospital follow up appointment   She presented to [facility] on 2/25 for scheduled knee surgery.  During surgery, patient reportedly became tachycardic to the 140s.  EKG strip reportedly showed atrial fibrillation vs SVT.  There are no tracings in the chart available for review.  EKG from 12/28/2023 showed sinus rhythm with occasional PVCs, heart rate 76 bpm. Cardiology was consulted. On interview, patient denied having any cardiac history.  Reported that over 30 years ago, she saw cardiologist, completed a stress test and wore a cardiac monitor.  Both studies were reportedly normal.  She denied recent episodes of chest pain, shortness of breath, palpitations, tachycardia, syncope, near syncope, dizziness. Telemetry did not show any abnormal rhythms. She was discharged with a cardiac monitor that showed ***  Tachycardia  -While undergoing left total knee replacement  on 2/25, patient became tachycardic with heart rate to the 140s.  EKG strip reportedly showed SVT versus atrial fibrillation.  There are no tracings available for review in the chart. Patient denied personal cardiac history.  Denied having any episodes of palpitations, chest pain, shortness of breath, syncope, near syncope, dizziness.  Denied  fluttering in her chest or feelings of racing heartbeat. - On telemetry that admission,  patient maintained normal sinus rhythm with heart rate in the 60s.  Only rare PVCs  noted -  HLD  - Lipid panel from 06/2023 showed LDL 76, HDL 74, triglycerides 94, total cholesterol 168  - continue pravastatin 20 mg daily   Labwork independently reviewed:   ROS: .   *** denies chest pain, shortness of breath, lower extremity edema, fatigue, palpitations, melena, hematuria, hemoptysis, diaphoresis, weakness, presyncope, syncope, orthopnea, and PND.  All other systems are reviewed and otherwise negative.  Studies Reviewed: Marland Kitchen    EKG:  EKG is ordered today, personally reviewed, demonstrating ***     CV Studies: Cardiac studies reviewed are outlined and summarized above. Otherwise please see EMR for full report.      Current Reported Medications:.    No outpatient medications have been marked as taking for the 02/16/24 encounter (Appointment) with Rip Harbour, NP.    Physical Exam:    VS:  There were no vitals taken for this visit.   Wt Readings from Last 3 Encounters:  01/25/24 143 lb 8 oz (65.1 kg)  01/11/24 145 lb (65.8 kg)  12/29/23 145 lb (65.8 kg)    GEN: Well nourished, well developed in no acute distress NECK: No JVD; No carotid bruits CARDIAC: ***RRR, no murmurs, rubs, gallops RESPIRATORY:  Clear to auscultation without rales, wheezing or rhonchi  ABDOMEN: Soft, non-tender, non-distended EXTREMITIES:  No edema; No acute deformity     Asessement and Plan:.     ***     Disposition: F/u with ***  Signed, Rip Harbour, NP

## 2024-02-15 DIAGNOSIS — M1712 Unilateral primary osteoarthritis, left knee: Secondary | ICD-10-CM | POA: Diagnosis not present

## 2024-02-15 DIAGNOSIS — M25562 Pain in left knee: Secondary | ICD-10-CM | POA: Diagnosis not present

## 2024-02-16 ENCOUNTER — Ambulatory Visit: Admitting: Cardiology

## 2024-02-18 ENCOUNTER — Telehealth: Payer: Self-pay | Admitting: *Deleted

## 2024-02-18 ENCOUNTER — Encounter: Payer: Self-pay | Admitting: Internal Medicine

## 2024-02-18 ENCOUNTER — Other Ambulatory Visit (HOSPITAL_COMMUNITY): Payer: Self-pay

## 2024-02-18 ENCOUNTER — Other Ambulatory Visit: Payer: Self-pay

## 2024-02-18 DIAGNOSIS — M1712 Unilateral primary osteoarthritis, left knee: Secondary | ICD-10-CM | POA: Diagnosis not present

## 2024-02-18 DIAGNOSIS — I479 Paroxysmal tachycardia, unspecified: Secondary | ICD-10-CM | POA: Diagnosis not present

## 2024-02-18 DIAGNOSIS — M25562 Pain in left knee: Secondary | ICD-10-CM | POA: Diagnosis not present

## 2024-02-18 MED ORDER — DILTIAZEM HCL 30 MG PO TABS
30.0000 mg | ORAL_TABLET | Freq: Four times a day (QID) | ORAL | 1 refills | Status: DC
  Filled 2024-02-18: qty 120, 30d supply, fill #0

## 2024-02-18 MED ORDER — APIXABAN 5 MG PO TABS
5.0000 mg | ORAL_TABLET | Freq: Two times a day (BID) | ORAL | 3 refills | Status: DC
  Filled 2024-02-18: qty 180, 90d supply, fill #0

## 2024-02-18 NOTE — Telephone Encounter (Signed)
   Cardiac Monitor Alert  Date of alert:  02/18/2024   Patient Name: [name]  DOB: 12-04-1940  MRN: [id number]   [name] Cardiologist: Maisie Fus, MD   [facility] EP:  None    Monitor Information: Cardiac Event Monitor [[employer]]  Reason:  Paroxysmal tachycardia  Ordering provider:  Laural Benes, [place]   Alert Atrial Fibrillation/Flutter This is the 1st alert for this rhythm.   The patient is not currently on anticoagulation.  Next Cardiology Appointment   Date:  03/03/2024  Provider:  Shelva Majestic, NP    Plan:  DOD for am Wyline Mood, MD) was notified; she is aware and will get to this as soon as she is able to. Dr. Antoine Poche is DOD for the rest of the day today.    Candace Cruise [place], [place]  02/18/2024 12:21 PM

## 2024-02-18 NOTE — Telephone Encounter (Signed)
 Her ziopatch demonstrated atrial fibrillation.  She has a low burden. We talked about starting Eliquis as well as diltiazem as needed for palpitations.  She is asymptomatic

## 2024-02-21 ENCOUNTER — Other Ambulatory Visit (HOSPITAL_COMMUNITY): Payer: Self-pay

## 2024-02-21 ENCOUNTER — Other Ambulatory Visit: Payer: Self-pay

## 2024-02-22 DIAGNOSIS — M1712 Unilateral primary osteoarthritis, left knee: Secondary | ICD-10-CM | POA: Diagnosis not present

## 2024-02-22 DIAGNOSIS — M25562 Pain in left knee: Secondary | ICD-10-CM | POA: Diagnosis not present

## 2024-02-25 DIAGNOSIS — M1712 Unilateral primary osteoarthritis, left knee: Secondary | ICD-10-CM | POA: Diagnosis not present

## 2024-02-25 DIAGNOSIS — M25562 Pain in left knee: Secondary | ICD-10-CM | POA: Diagnosis not present

## 2024-02-28 ENCOUNTER — Other Ambulatory Visit (HOSPITAL_COMMUNITY): Payer: Self-pay

## 2024-02-28 ENCOUNTER — Ambulatory Visit: Payer: Self-pay

## 2024-02-28 NOTE — Telephone Encounter (Signed)
 Copied from CRM [id number]. Topic: Appointments - Appointment Scheduling >> Feb 28, 2024  9:49 AM Donnise Galen B wrote: Patient/patient representative is calling to schedule an appointment. [name] is currently having a headache that has been ongoing for 3 days. States that it is causing her extreme pain at times and is not sure what to do to help it.   Chief Complaint: Headache  Symptoms: Headache Frequency: Intermittent  Disposition: [] ED /[] Urgent Care (no appt availability in office) / [x] Appointment(In office/virtual)/ []  [facility]/ [] Home Care/ [] Refused Recommended Disposition /[] [facility]/ []  Follow-up with PCP Additional Notes: Patient reports experiencing a headache for the last 3-4 days. She states her headache is resolved with Tylenol but will return once it's worn off. She reports a history of migraines but states she has not had one in years. Appointment made for tomorrow for evaluation and treatment.      Reason for Disposition  [1] MILD-MODERATE headache AND [2] present > 72 hours  Answer Assessment - Initial Assessment Questions 1. LOCATION: "Where does it hurt?"      Right side by temple  2. ONSET: "When did the headache start?" (Minutes, hours or days)      3-4 days 3. PATTERN: "Does the pain come and go, or has it been constant since it started?"     Intermittent   4. SEVERITY: "How bad is the pain?" and "What does it keep you from doing?"  (e.g., Scale 1-10; mild, moderate, or severe)   - MILD (1-3): doesn't interfere with normal activities    - MODERATE (4-7): interferes with normal activities or awakens from sleep    - SEVERE (8-10): excruciating pain, unable to do any normal activities        Moderate  5. RECURRENT SYMPTOM: "Have you ever had headaches before?" If Yes, ask: "When was the last time?" and "What happened that time?"      Yes 6. CAUSE: "What do you think is causing the headache?"     Unsure  7. MIGRAINE: "Have you been  diagnosed with migraine headaches?" If Yes, ask: "Is this headache similar?"      Yes 8. HEAD INJURY: "Has there been any recent injury to the head?"      No 9. OTHER SYMPTOMS: "Do you have any other symptoms?" (fever, stiff neck, eye pain, sore throat, cold symptoms)     No  Protocols used: Headache-A-AH

## 2024-02-29 ENCOUNTER — Ambulatory Visit (INDEPENDENT_AMBULATORY_CARE_PROVIDER_SITE_OTHER): Admitting: Internal Medicine

## 2024-02-29 ENCOUNTER — Encounter: Payer: Self-pay | Admitting: Internal Medicine

## 2024-02-29 ENCOUNTER — Other Ambulatory Visit: Payer: Self-pay

## 2024-02-29 VITALS — BP 134/82 | HR 94 | Temp 98.2°F | Resp 16 | Ht 62.0 in | Wt 144.2 lb

## 2024-02-29 DIAGNOSIS — G43011 Migraine without aura, intractable, with status migrainosus: Secondary | ICD-10-CM

## 2024-02-29 MED ORDER — KETOROLAC TROMETHAMINE 60 MG/2ML IM SOLN
60.0000 mg | Freq: Once | INTRAMUSCULAR | Status: AC
  Administered 2024-02-29: 60 mg via INTRAMUSCULAR

## 2024-02-29 NOTE — Progress Notes (Signed)
 Acute Office Visit  Subjective:     [name], female    DOB: [birth year], 83 y.o.   MRN: [id number]  Chief Complaint  Patient presents with   Headache    For 4 days    Headache    Patient is in today for headaches x 4 days.   Discussed the use of AI scribe software for clinical note transcription with the patient, who gave verbal consent to proceed.  History of Present Illness The patient, with a history of atrial fibrillation (AFib) and recent knee surgery, presents with headaches of four days duration. The headaches are unilateral and reminiscent of previous migraines. The pain is initially mild, but intensifies over time. Accompanying symptoms include weakness and nausea. The patient manages the pain with Tylenol, but is concerned about overuse.  The patient also reports feeling weak and tired, which she attributes to her AFib. She is scheduled to see a cardiologist for further management of her AFib, which was discovered during her recent knee surgery. She is currently taking diltiazem for rate control and is expected to start Eliquis, an anticoagulant, after her cardiology appointment.  The patient also has a history of anemia and is taking iron supplements. She has stopped taking the supplements temporarily due to concerns about side effects, but plans to resume them after confirming with her doctor.    Review of Systems  Constitutional:  Positive for malaise/fatigue.  Neurological:  Positive for headaches.        Objective:    BP 134/82 (Cuff Size: Normal)   Pulse 94   Temp 98.2 F (36.8 C) (Oral)   Resp 16   Ht 5\' 2"  (1.575 m)   Wt 144 lb 3.2 oz (65.4 kg)   SpO2 96%   BMI 26.37 kg/m  BP Readings from Last 3 Encounters:  02/29/24 134/82  01/25/24 122/78  01/12/24 (!) 112/57   Wt Readings from Last 3 Encounters:  02/29/24 144 lb 3.2 oz (65.4 kg)  01/25/24 143 lb 8 oz (65.1 kg)  01/11/24 145 lb (65.8 kg)      Physical  Exam Constitutional:      Appearance: Normal appearance.  HENT:     Head: Normocephalic and atraumatic.     Mouth/Throat:     Mouth: Mucous membranes are moist.     Pharynx: Oropharynx is clear.  Eyes:     Extraocular Movements: Extraocular movements intact.     Conjunctiva/sclera: Conjunctivae normal.     Pupils: Pupils are equal, round, and reactive to light.  Cardiovascular:     Rate and Rhythm: Normal rate and regular rhythm.  Pulmonary:     Effort: Pulmonary effort is normal.     Breath sounds: Normal breath sounds.  Skin:    General: Skin is warm and dry.  Neurological:     General: No focal deficit present.     Mental Status: She is alert. Mental status is at baseline.  Psychiatric:        Mood and Affect: Mood normal.        Behavior: Behavior normal.     No results found for any visits on 02/29/24.      Assessment & Plan:   Assessment & Plan Migraine Headaches Unilateral headaches consistent with migraines, persistent every morning, improving throughout the day. Tylenol used for pain relief. Toradol injection offered to interrupt cycle, safe as not on Eliquis yet. - Administer Toradol injection to interrupt migraine cycle. - Continue Tylenol for pain  management. - Avoid anti-inflammatory medications once on Eliquis.  Atrial Fibrillation (AFib) Diagnosed with AFib, confirmed by heart monitor. Asymptomatic for palpitations or dyspnea. Emphasized rate and rhythm control and anticoagulation to prevent stroke. Eliquis planned for future management, pending cost resolution. - Discuss cost concerns and medication options with cardiologist on Friday. - Initiate Eliquis for stroke prevention once affordable options are identified. - Ensure rate and rhythm control with appropriate medications.  Anemia Anemia post-knee surgery blood loss. Iron supplements advised despite side effect concerns. - Resume iron supplementation as previously prescribed.  General Health  Maintenance Advised on hydration post-surgery and avoiding medications that may interact with future anticoagulation therapy. - Increase water intake to prevent dehydration. - Avoid Celebrex and other NSAIDs once on Eliquis.   Return if symptoms worsen or fail to improve.  Rockney Cid, DO

## 2024-03-01 NOTE — Progress Notes (Unsigned)
 Cardiology Office Note    Date:  03/04/2024  ID:  [name], DOB [birth year], MRN [id number] PCP:  Arleen Lacer, MD  Cardiologist:  Bridgette Campus, MD  Electrophysiologist:  None   Chief Complaint: Follow up for atrial fibrillation   History of Present Illness: .    [name] is a 83 y.o. female with visit-pertinent history of osteoarthritis, esophageal dysphagia, hiatal hernia, depression, GERD, anxiety, hyperlipidemia. She recently established care with cardiology while admitted after a knee replacement, presents today for a hospital follow up appointment   She presented to [facility] on 2/25 for scheduled knee surgery.  During surgery, patient reportedly became tachycardic to the 140s.  EKG strip reportedly showed atrial fibrillation vs SVT.  There are no tracings in the chart available for review.  EKG from 12/28/2023 showed sinus rhythm with occasional PVCs, heart rate 76 bpm.  Cardiology was consulted. On interview, patient denied having any cardiac history.  Reported that over 30 years ago, she saw cardiologist, completed a stress test and wore a cardiac monitor.  Both studies were reportedly normal.  She denied recent episodes of chest pain, shortness of breath, palpitations, tachycardia, syncope, near syncope, dizziness. Telemetry did not show any abnormal rhythms. She was discharged with a cardiac monitor that showed an average heart rate 82 bpm ranging from 59 to 229 bpm.  Predominant underlying rhythm was sinus rhythm.  Atrial fibrillation occurred with a less than 1% burden, ranging from 110 to 229 bpm, average 151 bpm, longest episode lasting 16 minutes and 6 seconds with an average rate of 139 bpm.  Rare ectopy.  Patient was started on Eliquis  5 mg twice daily and as needed diltiazem .  Today she presents for follow-up.  She reports that she has been doing very well overall.  She denies any chest pain, shortness of breath, lower extremity edema,  orthopnea or PND.  Reviewed patient's cardiac monitor results, patient denies any feelings of atrial fibrillation.  She denies any palpitations or increased heart rates.  She denies any presyncope or syncope.  She does note concerns regarding cost of Eliquis , notes she is unable to afford and has not yet started on the medication.  ROS: .   Today she denies chest pain, shortness of breath, lower extremity edema, fatigue, palpitations, melena, hematuria, hemoptysis, diaphoresis, weakness, presyncope, syncope, orthopnea, and PND.  All other systems are reviewed and otherwise negative. Studies Reviewed: Aaron Aas   EKG:  EKG is not ordered today.  CV Studies: Cardiac studies reviewed are outlined and summarized above. Otherwise please see EMR for full report. Cardiac Studies & Procedures   ______________________________________________________________________________________________        Carles Cheadle  LONG TERM MONITOR (3-14 DAYS) 02/18/2024  Narrative <1 % burden of atrial fibrillation. Patch Wear Time:  13 days and 23 hours (2025-03-09T14:14:13-0400 to 2025-03-23T13:23:54-0400)  Patient had a min HR of 59 bpm, max HR of 229 bpm, and avg HR of 82 bpm. Predominant underlying rhythm was Sinus Rhythm. Atrial Fibrillation occurred (<1% burden), ranging from 110-229 bpm (avg of 151 bpm), the longest lasting 16 mins 6 secs with an avg rate of 139 bpm. Isolated SVEs were rare (<1.0%), SVE Couplets were rare (<1.0%), and SVE Triplets were rare (<1.0%). Isolated VEs were occasional (2.8%, 45402), VE Couplets were rare (<1.0%, 758), and VE Triplets were rare (<1.0%, 5). Ventricular Bigeminy and Trigeminy were present. MD notification criteria for Rapid Atrial Fibrillation met - report posted prior to notification (MS).  ______________________________________________________________________________________________       Current Reported Medications:.    Current Meds  Medication Sig   acetaminophen   (TYLENOL ) 500 MG tablet Take 500-1,000 mg by mouth every 6 (six) hours as needed for mild pain (pain score 1-3) or moderate pain (pain score 4-6).   apixaban  (ELIQUIS ) 5 MG TABS tablet Take 1 tablet (5 mg total) by mouth 2 (two) times daily.   apixaban  (ELIQUIS ) 5 MG TABS tablet Take 1 tablet (5 mg total) by mouth 2 (two) times daily.   brimonidine  (ALPHAGAN ) 0.2 % ophthalmic solution Place 1 drop into both eyes 3 (three) times daily.   buPROPion  (WELLBUTRIN  XL) 150 MG 24 hr tablet TAKE 1 TABLET (150 MG TOTAL) BY MOUTH IN THE MORNING   Cholecalciferol (VITAMIN D3) 50 MCG (2000 UT) capsule Take 1 capsule (2,000 Units total) by mouth daily.   citalopram  (CELEXA ) 10 MG tablet Take 1 tablet (10 mg total) by mouth daily.   fluticasone  (FLONASE ) 50 MCG/ACT nasal spray SPRAY 2 SPRAYS INTO EACH NOSTRIL EVERY DAY (Patient taking differently: Place 2 sprays into both nostrils daily as needed for allergies or rhinitis.)   Guaifenesin  (MUCINEX  MAXIMUM STRENGTH) 1200 MG TB12 Take 1,200 mg by mouth 2 (two) times daily as needed (congestion).   latanoprost  (XALATAN ) 0.005 % ophthalmic solution Place 1 drop into both eyes at bedtime.   omeprazole  (PRILOSEC) 40 MG capsule TAKE 1 CAPSULE (40 MG TOTAL) BY MOUTH DAILY.   polyethylene glycol (MIRALAX  / GLYCOLAX ) 17 g packet Take 17 g by mouth 2 (two) times daily.   Polyvinyl Alcohol-Povidone (REFRESH OP) Place 2 drops into both eyes 3 (three) times daily as needed (dry eye).   pravastatin  (PRAVACHOL ) 20 MG tablet Take 1 tablet (20 mg total) by mouth daily.   vitamin B-12 (CYANOCOBALAMIN ) 500 MCG tablet Take 500 mcg by mouth daily.   [DISCONTINUED] diltiazem  (CARDIZEM ) 30 MG tablet Take 1 tablet (30 mg total) by mouth 4 (four) times daily.   Physical Exam:    VS:  BP 124/78   Pulse 64   Ht 5' 2.5" (1.588 m)   Wt 144 lb 9.6 oz (65.6 kg)   SpO2 92%   BMI 26.03 kg/m    Wt Readings from Last 3 Encounters:  03/03/24 144 lb 9.6 oz (65.6 kg)  02/29/24 144 lb 3.2 oz  (65.4 kg)  01/25/24 143 lb 8 oz (65.1 kg)    GEN: Well nourished, well developed in no acute distress NECK: No JVD; No carotid bruits CARDIAC: RRR, no murmurs, rubs, gallops RESPIRATORY:  Clear to auscultation without rales, wheezing or rhonchi  ABDOMEN: Soft, non-tender, non-distended EXTREMITIES:  No edema; No acute deformity     Asessement and Plan:.    Atrial fibrillation/Tachycardia:  While undergoing left total knee replacement  on 2/25, patient became tachycardic with heart rate to the 140s.  EKG strip reportedly showed SVT versus atrial fibrillation.  There are no tracings available for review in the chart. Patient denied personal cardiac history.  Denied having any episodes of palpitations, chest pain, shortness of breath, syncope, near syncope, dizziness.  Denied  fluttering in her chest or feelings of racing heartbeat.  On telemetry that admission,  patient maintained normal sinus rhythm with heart rate in the 60s.  Only rare PVCs noted.  Cardiac monitor indicated a less than 1% burden of atrial fibrillation.  Patient started on Eliquis  and as needed diltiazem . Today she reports that she she has been doing well, denies any chest pain, shortness of  breath, palpitations or feelings of atrial fibrillation.  Patient was not aware she was in atrial fibrillation.  She has not yet started on Eliquis  due to cost, patient to explore options, she is considering applying for assistance, will notify the office if assistance is needed with paperwork.  Patient notes that warfarin would not be an option for her.  Samples provided and 30-day co-pay card provided.  Patient will reach out to her insurance agency.  Continue Eliquis  5 mg twice daily and as needed diltiazem . Check echocardiogram.   HLD: Lipid panel from 06/2023 showed LDL 76, HDL 74, triglycerides 94, total cholesterol 168. Continue pravastatin  20 mg daily.    Disposition: F/u with Bradley Handyside, NP in two months.   Signed, Makhari Dovidio D Tsuyako Jolley, NP

## 2024-03-02 DIAGNOSIS — M25562 Pain in left knee: Secondary | ICD-10-CM | POA: Diagnosis not present

## 2024-03-02 DIAGNOSIS — M1712 Unilateral primary osteoarthritis, left knee: Secondary | ICD-10-CM | POA: Diagnosis not present

## 2024-03-03 ENCOUNTER — Ambulatory Visit: Attending: Cardiology | Admitting: Cardiology

## 2024-03-03 ENCOUNTER — Encounter: Payer: Self-pay | Admitting: Cardiology

## 2024-03-03 VITALS — BP 124/78 | HR 64 | Ht 62.5 in | Wt 144.6 lb

## 2024-03-03 DIAGNOSIS — I479 Paroxysmal tachycardia, unspecified: Secondary | ICD-10-CM | POA: Diagnosis not present

## 2024-03-03 DIAGNOSIS — I480 Paroxysmal atrial fibrillation: Secondary | ICD-10-CM

## 2024-03-03 DIAGNOSIS — E785 Hyperlipidemia, unspecified: Secondary | ICD-10-CM | POA: Diagnosis not present

## 2024-03-03 MED ORDER — APIXABAN 5 MG PO TABS: 5.0000 mg | ORAL_TABLET | Freq: Two times a day (BID) | ORAL | Status: DC

## 2024-03-03 MED ORDER — DILTIAZEM HCL 30 MG PO TABS: ORAL_TABLET | ORAL | 1 refills | Status: DC

## 2024-03-03 NOTE — Patient Instructions (Addendum)
 Medication Instructions:  Check on low income subsidy for the Eliquis , If denied please reach out to office. *If you need a refill on your cardiac medications before your next appointment, please call your pharmacy*  Lab Work: No labs If you have labs (blood work) drawn today and your tests are completely normal, you will receive your results only by: MyChart Message (if you have MyChart) OR A paper copy in the mail If you have any lab test that is abnormal or we need to change your treatment, we will call you to review the results.  Testing/Procedures: Your physician has requested that you have an echocardiogram. Echocardiography is a painless test that uses sound waves to create images of your heart. It provides your doctor with information about the size and shape of your heart and how well your heart's chambers and valves are working. This procedure takes approximately one hour. There are no restrictions for this procedure. Please do NOT wear cologne, perfume, aftershave, or lotions (deodorant is allowed). Please arrive 15 minutes prior to your appointment time.  Please note: We ask at that you not bring children with you during ultrasound (echo/ vascular) testing. Due to room size and safety concerns, children are not allowed in the ultrasound rooms during exams. Our front office staff cannot provide observation of children in our lobby area while testing is being conducted. An adult accompanying a patient to their appointment will only be allowed in the ultrasound room at the discretion of the ultrasound technician under special circumstances. We apologize for any inconvenience.  Follow-Up: At [facility], you and your health needs are our priority.  As part of our continuing mission to provide you with exceptional heart care, our providers are all part of one team.  This team includes your primary Cardiologist (physician) and Advanced Practice Providers or APPs (Physician  Assistants and Nurse Practitioners) who all work together to provide you with the care you need, when you need it.  Your next appointment:   2 month(s)  Provider:   Katlyn West, NP    We recommend signing up for the patient portal called "MyChart".  Sign up information is provided on this After Visit Summary.  MyChart is used to connect with patients for Virtual Visits (Telemedicine).  Patients are able to view lab/test results, encounter notes, upcoming appointments, etc.  Non-urgent messages can be sent to your provider as well.   To learn more about what you can do with MyChart, go to [email or web address].   Other Instructions:   1st Floor: - Lobby - Registration  - Pharmacy  - Lab - Cafe  2nd Floor: - PV Lab - Diagnostic Testing (echo, CT, nuclear med)  3rd Floor: - Vacant  4th Floor: - TCTS (cardiothoracic surgery) - AFib Clinic - Structural Heart Clinic - Vascular Surgery  - Vascular Ultrasound  5th Floor: - [facility] (general and EP) - Clinical Pharmacy for coumadin, hypertension, lipid, weight-loss medications, and med management appointments    Valet parking services will be available as well.

## 2024-03-04 ENCOUNTER — Encounter: Payer: Self-pay | Admitting: Cardiology

## 2024-03-08 DIAGNOSIS — Z471 Aftercare following joint replacement surgery: Secondary | ICD-10-CM | POA: Diagnosis not present

## 2024-03-08 DIAGNOSIS — Z96652 Presence of left artificial knee joint: Secondary | ICD-10-CM | POA: Diagnosis not present

## 2024-03-08 DIAGNOSIS — M810 Age-related osteoporosis without current pathological fracture: Secondary | ICD-10-CM | POA: Diagnosis not present

## 2024-03-09 ENCOUNTER — Other Ambulatory Visit: Payer: Self-pay | Admitting: Family Medicine

## 2024-03-09 DIAGNOSIS — R0981 Nasal congestion: Secondary | ICD-10-CM

## 2024-03-10 DIAGNOSIS — M1712 Unilateral primary osteoarthritis, left knee: Secondary | ICD-10-CM | POA: Diagnosis not present

## 2024-03-10 DIAGNOSIS — M25562 Pain in left knee: Secondary | ICD-10-CM | POA: Diagnosis not present

## 2024-03-14 DIAGNOSIS — M25562 Pain in left knee: Secondary | ICD-10-CM | POA: Diagnosis not present

## 2024-03-14 DIAGNOSIS — M1712 Unilateral primary osteoarthritis, left knee: Secondary | ICD-10-CM | POA: Diagnosis not present

## 2024-03-23 ENCOUNTER — Telehealth: Payer: Self-pay | Admitting: Cardiology

## 2024-03-23 ENCOUNTER — Other Ambulatory Visit: Payer: Self-pay | Admitting: *Deleted

## 2024-03-23 DIAGNOSIS — I480 Paroxysmal atrial fibrillation: Secondary | ICD-10-CM

## 2024-03-23 MED ORDER — APIXABAN 5 MG PO TABS: 5.0000 mg | ORAL_TABLET | Freq: Two times a day (BID) | ORAL | 1 refills | Status: DC

## 2024-03-23 NOTE — Telephone Encounter (Signed)
 Eliquis  5mg  refill request received. Patient is 83 years old, weight-65.6kg, Crea-0.59 on 01/12/24, Diagnosis-Afib, and last seen by Katlyn West on 03/03/24. Dose is appropriate based on dosing criteria. Will send in refill to requested pharmacy.

## 2024-03-23 NOTE — Telephone Encounter (Signed)
*  STAT* If patient is at the pharmacy, call can be transferred to refill team.   1. Which medications need to be refilled? (please list name of each medication and dose if known) new prescription for Eliquis    2. Would you like to learn more about the convenience, safety, & potential cost savings by using the [facility]?     3. Are you open to using the [facility] (Type [facility].    4. Which pharmacy/location (including street and city if local pharmacy) is medication to be sent to?[facility] [address] [place]   5. Do they need a 30 day or 90 day supply? 30 days and refills

## 2024-03-28 ENCOUNTER — Other Ambulatory Visit: Payer: Self-pay | Admitting: Cardiology

## 2024-03-29 ENCOUNTER — Encounter: Payer: Self-pay | Admitting: Family Medicine

## 2024-03-29 ENCOUNTER — Ambulatory Visit (INDEPENDENT_AMBULATORY_CARE_PROVIDER_SITE_OTHER): Admitting: Family Medicine

## 2024-03-29 VITALS — BP 136/80 | HR 104 | Resp 18 | Ht 62.5 in | Wt 144.2 lb

## 2024-03-29 DIAGNOSIS — E041 Nontoxic single thyroid nodule: Secondary | ICD-10-CM

## 2024-03-29 DIAGNOSIS — E559 Vitamin D deficiency, unspecified: Secondary | ICD-10-CM

## 2024-03-29 DIAGNOSIS — D62 Acute posthemorrhagic anemia: Secondary | ICD-10-CM | POA: Diagnosis not present

## 2024-03-29 DIAGNOSIS — F339 Major depressive disorder, recurrent, unspecified: Secondary | ICD-10-CM

## 2024-03-29 DIAGNOSIS — R5383 Other fatigue: Secondary | ICD-10-CM

## 2024-03-29 DIAGNOSIS — E538 Deficiency of other specified B group vitamins: Secondary | ICD-10-CM | POA: Diagnosis not present

## 2024-03-29 LAB — CBC WITH DIFFERENTIAL/PLATELET
Absolute Lymphocytes: 1890 {cells}/uL (ref 850–3900)
Absolute Monocytes: 336 {cells}/uL (ref 200–950)
Basophils Absolute: 18 {cells}/uL (ref 0–200)
Basophils Relative: 0.3 %
Eosinophils Absolute: 168 {cells}/uL (ref 15–500)
Eosinophils Relative: 2.8 %
HCT: 42.1 % (ref 35.0–45.0)
Hemoglobin: 13.3 g/dL (ref 11.7–15.5)
MCH: 27.1 pg (ref 27.0–33.0)
MCHC: 31.6 g/dL — ABNORMAL LOW (ref 32.0–36.0)
MCV: 85.7 fL (ref 80.0–100.0)
MPV: 10.8 fL (ref 7.5–12.5)
Monocytes Relative: 5.6 %
Neutro Abs: 3588 {cells}/uL (ref 1500–7800)
Neutrophils Relative %: 59.8 %
Platelets: 269 10*3/uL (ref 140–400)
RBC: 4.91 10*6/uL (ref 3.80–5.10)
RDW: 12.7 % (ref 11.0–15.0)
Total Lymphocyte: 31.5 %
WBC: 6 10*3/uL (ref 3.8–10.8)

## 2024-03-29 LAB — COMPREHENSIVE METABOLIC PANEL WITH GFR
AG Ratio: 1.7 (calc) (ref 1.0–2.5)
ALT: 11 U/L (ref 6–29)
AST: 17 U/L (ref 10–35)
Albumin: 4.5 g/dL (ref 3.6–5.1)
Alkaline phosphatase (APISO): 56 U/L (ref 37–153)
BUN/Creatinine Ratio: 16 (calc) (ref 6–22)
BUN: 16 mg/dL (ref 7–25)
CO2: 30 mmol/L (ref 20–32)
Calcium: 9.3 mg/dL (ref 8.6–10.4)
Chloride: 105 mmol/L (ref 98–110)
Creat: 0.97 mg/dL — ABNORMAL HIGH (ref 0.60–0.95)
Globulin: 2.7 g/dL (ref 1.9–3.7)
Glucose, Bld: 115 mg/dL — ABNORMAL HIGH (ref 65–99)
Potassium: 4.3 mmol/L (ref 3.5–5.3)
Sodium: 142 mmol/L (ref 135–146)
Total Bilirubin: 0.4 mg/dL (ref 0.2–1.2)
Total Protein: 7.2 g/dL (ref 6.1–8.1)
eGFR: 58 mL/min/{1.73_m2} — ABNORMAL LOW (ref >=60)

## 2024-03-29 LAB — B12 AND FOLATE PANEL
Folate: 7.2 ng/mL
Vitamin B-12: 390 pg/mL (ref 200–1100)

## 2024-03-29 LAB — IRON,TIBC AND FERRITIN PANEL
%SAT: 22 % (ref 16–45)
Ferritin: 44 ng/mL (ref 16–288)
Iron: 82 ug/dL (ref 45–160)
TIBC: 373 ug/dL (ref 250–450)

## 2024-03-29 LAB — TSH: TSH: 2.62 m[IU]/L (ref 0.40–4.50)

## 2024-03-29 LAB — VITAMIN D 25 HYDROXY (VIT D DEFICIENCY, FRACTURES): Vit D, 25-Hydroxy: 38 ng/mL (ref 30–100)

## 2024-03-29 NOTE — Progress Notes (Signed)
 Name: [name]   MRN: [id number]    DOB: 02-Aug-1941   Date:03/29/2024       Progress Note  Subjective  Chief Complaint  Chief Complaint  Patient presents with   Fatigue   Medication Problem    Pt states need to d/c citalopram     Discussed the use of AI scribe software for clinical note transcription with the patient, who gave verbal consent to proceed.  History of Present Illness [name] [name] "[name]" is an 83 year old female with atrial fibrillation and depression who presents with fatigue and medication concerns.  She underwent a left knee replacement on January 11, 2024. During her hospital stay, she experienced atrial fibrillation with a heart rate reaching 140 bpm. She was subsequently placed on Eliquis , after her hospitalization. She also has a prescription for Cardizem  (diltiazem ), but she has not used it as she has not felt symptomatic.  She has a history of depression and has been on citalopram  for a long time. Recently, she stopped taking citalopram  due to concerns about interactions with Eliquis . She feels 'completely like no motivation' and 'weak' since stopping the medication, which she did two days ago. She describes feeling drained and lacking energy, particularly after activities like showering.  She reports feeling fatigued for about a month, attributing it initially to recovery from her knee surgery. She has not been taking her vitamins, including B12 and vitamin D , for several weeks. She has a history of low B12 levels and anemia with a hemoglobin level of 10.7 post-surgery. No recent blood work has been done to check her current status.  No shortness of breath, palpitations, or burning during urination. She feels tired and drained.    Patient Active Problem List   Diagnosis Date Noted   Paroxysmal tachycardia (HCC) 01/25/2024   S/P total knee arthroplasty, left 01/11/2024   Multiple thyroid  nodules 04/09/2023   Impingement syndrome of right  shoulder region 04/07/2023   Esophageal dysphagia 01/05/2023   Hiatal hernia 01/05/2023   Menopausal osteoporosis 04/08/2022   B12 deficiency 04/08/2022   History of total left hip replacement 04/08/2022   Gait instability 04/08/2022   Impaired fasting glucose    Overweight (BMI 25.0-29.9) 04/03/2020   Status post total knee replacement, right 04/02/2020   Presence of right artificial knee joint 04/02/2020   Senile purpura (HCC) 12/30/2018   S/P vaginal hysterectomy 05/30/2018   Major depression, recurrent, chronic (HCC) 03/02/2018   GERD without esophagitis 10/20/2016   Anxiety, generalized 01/28/2016   Abnormal brain CT 06/20/2015   Dyslipidemia 06/20/2015   Vertigo 06/20/2015   History of hypertension 06/20/2015   Arthralgia of shoulder 06/20/2015   Glaucoma 06/20/2015    Social History   Tobacco Use   Smoking status: Never   Smokeless tobacco: Never   Tobacco comments:    smoking cessation materials not required  Substance Use Topics   Alcohol use: Yes    Comment: Rare     Current Outpatient Medications:    acetaminophen  (TYLENOL ) 500 MG tablet, Take 500-1,000 mg by mouth every 6 (six) hours as needed for mild pain (pain score 1-3) or moderate pain (pain score 4-6)., Disp: , Rfl:    apixaban  (ELIQUIS ) 5 MG TABS tablet, Take 1 tablet (5 mg total) by mouth 2 (two) times daily., Disp: , Rfl:    apixaban  (ELIQUIS ) 5 MG TABS tablet, Take 1 tablet (5 mg total) by mouth 2 (two) times daily., Disp: 180 tablet, Rfl: 1   brimonidine  (ALPHAGAN ) 0.2 %  ophthalmic solution, Place 1 drop into both eyes 3 (three) times daily., Disp: , Rfl:    buPROPion  (WELLBUTRIN  XL) 150 MG 24 hr tablet, TAKE 1 TABLET (150 MG TOTAL) BY MOUTH IN THE MORNING, Disp: 90 tablet, Rfl: 1   Cholecalciferol (VITAMIN D3) 50 MCG (2000 UT) capsule, Take 1 capsule (2,000 Units total) by mouth daily., Disp: 100 capsule, Rfl: 1   citalopram  (CELEXA ) 10 MG tablet, Take 1 tablet (10 mg total) by mouth daily., Disp:  90 tablet, Rfl: 1   diltiazem  (CARDIZEM ) 30 MG tablet, TAKE 1 TABLET 4 TIMES A DAY AS NEEDED FOR PALPITATIONS OR SUSTAINED HEART RATE GREATER THEN 110., Disp: 360 tablet, Rfl: 3   fluticasone  (FLONASE ) 50 MCG/ACT nasal spray, Place 2 sprays into both nostrils daily as needed for allergies or rhinitis., Disp: 48 mL, Rfl: 0   Guaifenesin  (MUCINEX  MAXIMUM STRENGTH) 1200 MG TB12, Take 1,200 mg by mouth 2 (two) times daily as needed (congestion)., Disp: , Rfl:    latanoprost  (XALATAN ) 0.005 % ophthalmic solution, Place 1 drop into both eyes at bedtime., Disp: , Rfl:    omeprazole  (PRILOSEC) 40 MG capsule, TAKE 1 CAPSULE (40 MG TOTAL) BY MOUTH DAILY., Disp: 90 capsule, Rfl: 1   polyethylene glycol (MIRALAX  / GLYCOLAX ) 17 g packet, Take 17 g by mouth 2 (two) times daily., Disp: 14 each, Rfl: 0   Polyvinyl Alcohol-Povidone (REFRESH OP), Place 2 drops into both eyes 3 (three) times daily as needed (dry eye)., Disp: , Rfl:    pravastatin  (PRAVACHOL ) 20 MG tablet, Take 1 tablet (20 mg total) by mouth daily., Disp: 90 tablet, Rfl: 2   vitamin B-12 (CYANOCOBALAMIN ) 500 MCG tablet, Take 500 mcg by mouth daily., Disp: , Rfl:    methocarbamol  (ROBAXIN ) 500 MG tablet, Take 1 tablet (500 mg total) by mouth every 6 (six) hours as needed for muscle spasms. (Patient not taking: Reported on 03/29/2024), Disp: 40 tablet, Rfl: 2  Allergies  Allergen Reactions   Biphosphate     Unknown    Codeine     headaches   Combigan [Brimonidine  Tartrate-Timolol]     drowsy   Sulfa Antibiotics Nausea And Vomiting and Nausea Only   Metronidazole Itching and Rash    ROS  Ten systems reviewed and is negative except as mentioned in HPI    Objective  Vitals:   03/29/24 1450 03/29/24 1451  BP: 136/80   Pulse: (!) 122 (!) 104  Resp: 18   SpO2: 94%   Weight: 144 lb 3.2 oz (65.4 kg)   Height: 5' 2.5" (1.588 m)     Body mass index is 25.95 kg/m.  Physical Exam Constitutional: Patient appears well-developed and  well-nourished.  No distress.  HEENT: head atraumatic, normocephalic, pupils equal and reactive to light,  neck supple limits Cardiovascular: Normal rate, regular rhythm and normal heart sounds.  No murmur heard. No BLE edema. Pulmonary/Chest: Effort normal and breath sounds normal. No respiratory distress. Abdominal: Soft.  There is no tenderness. Psychiatric: Patient has a normal mood and affect. behavior is normal. Judgment and thought content normal.     Recent Results (from the past 2160 hours)  CBC     Status: Abnormal   Collection Time: 01/12/24  3:47 AM  Result Value Ref Range   WBC 10.1 4.0 - 10.5 K/uL   RBC 3.75 (L) 3.87 - 5.11 MIL/uL   Hemoglobin 10.7 (L) 12.0 - 15.0 g/dL   HCT 09.8 (L) 11.9 - 14.7 %   MCV 90.1 80.0 -  100.0 fL   MCH 28.5 26.0 - 34.0 pg   MCHC 31.7 30.0 - 36.0 g/dL   RDW 16.1 09.6 - 04.5 %   Platelets 221 150 - 400 K/uL   nRBC 0.0 0.0 - 0.2 %    Comment: Performed at [facility], [address] [place], [place] [address]  Basic metabolic panel     Status: Abnormal   Collection Time: 01/12/24  3:47 AM  Result Value Ref Range   Sodium 137 135 - 145 mmol/L   Potassium 3.9 3.5 - 5.1 mmol/L   Chloride 104 98 - 111 mmol/L   CO2 24 22 - 32 mmol/L   Glucose, Bld 136 (H) 70 - 99 mg/dL    Comment: Glucose reference range applies only to samples taken after fasting for at least 8 hours.   BUN 16 8 - 23 mg/dL   Creatinine, Ser 1.91 0.44 - 1.00 mg/dL   Calcium  8.1 (L) 8.9 - 10.3 mg/dL   GFR, Estimated >47 >82 mL/min    Comment: (NOTE) Calculated using the CKD-EPI Creatinine Equation (2021)    Anion gap 9 5 - 15    Comment: Performed at [facility], [address] [place], [place] [address]      Assessment & Plan Paroxysmal Atrial Fibrillation Confirmed paroxysmal AFib post-surgery, currently asymptomatic with sinus rhythm and occasional PVCs. Anticoagulation for thromboembolic prevention. - Continue Eliquis  5 mg  twice daily. - Use diltiazem  as needed for symptomatic tachycardia.  Anemia due to blood loss Post-surgical anemia likely from blood loss and vitamin cessation, contributing to fatigue and weakness. - Order CBC to reassess hemoglobin. - Evaluate iron storage levels.  B12 deficiency Low B12 levels likely contributing to fatigue and weakness. - Assess B12 levels. - Consider B12 injections if low.  Depression Chronic depression exacerbated by citalopram  cessation, causing withdrawal and worsening symptoms. Restarting citalopram  necessary, monitor for bleeding due to Eliquis  interaction. - Restart citalopram  immediately and continue daily. - Monitor for bleeding symptoms. - Evaluate folate and vitamin D  levels. - Assess thyroid  function.

## 2024-03-30 ENCOUNTER — Ambulatory Visit: Payer: Self-pay | Admitting: Family Medicine

## 2024-04-04 ENCOUNTER — Ambulatory Visit: Payer: Self-pay | Admitting: Cardiology

## 2024-04-04 ENCOUNTER — Ambulatory Visit (HOSPITAL_COMMUNITY)
Admission: RE | Admit: 2024-04-04 | Discharge: 2024-04-04 | Disposition: A | Discharge status: Home / Self Care | Source: Ambulatory Visit | Attending: Cardiology | Admitting: Cardiology

## 2024-04-04 DIAGNOSIS — I479 Paroxysmal tachycardia, unspecified: Secondary | ICD-10-CM | POA: Diagnosis not present

## 2024-04-04 DIAGNOSIS — I480 Paroxysmal atrial fibrillation: Secondary | ICD-10-CM | POA: Insufficient documentation

## 2024-04-04 DIAGNOSIS — E785 Hyperlipidemia, unspecified: Secondary | ICD-10-CM | POA: Diagnosis not present

## 2024-04-04 LAB — ECHOCARDIOGRAM COMPLETE
Area-P 1/2: 3.03 cm2
S' Lateral: 3.2 cm

## 2024-04-05 NOTE — Telephone Encounter (Signed)
 Called patient advised of below they verbalized understanding No questions at this time.

## 2024-04-05 NOTE — Telephone Encounter (Signed)
-----   Message from Lorrin Rotter sent at 04/04/2024  5:24 PM EDT ----- Please let [name] know that her echocardiogram indicated normal heart squeeze and function. There was some mild stiffening of the left lower chamber of the heart, this is common with aging and history of hypertension. There were no significant valvular abnormalities.  Overall good result.  Continue current medications and follow-up as planned.

## 2024-04-08 ENCOUNTER — Other Ambulatory Visit: Payer: Self-pay | Admitting: Family Medicine

## 2024-04-08 DIAGNOSIS — M170 Bilateral primary osteoarthritis of knee: Secondary | ICD-10-CM

## 2024-04-23 NOTE — Progress Notes (Unsigned)
 Cardiology Office Note    Date:  04/23/2024  ID:  [name], DOB 1941-02-23, MRN [id number] PCP:  Arleen Lacer, MD  Cardiologist:  Bridgette Campus, MD (Inactive)  Electrophysiologist:  None   Chief Complaint: ***  History of Present Illness: .    [name] is a 83 y.o. female with visit-pertinent history of osteoarthritis, esophageal dysphagia, hiatal hernia, depression, GERD, anxiety, hyperlipidemia. She recently established care with cardiology while admitted after a knee replacement, presents today for a hospital follow up appointment   She presented to [facility] on 2/25 for scheduled knee surgery.  During surgery, patient reportedly became tachycardic to the 140s.  EKG strip reportedly showed atrial fibrillation vs SVT.  There are no tracings in the chart available for review.  EKG from 12/28/2023 showed sinus rhythm with occasional PVCs, heart rate 76 bpm.  Cardiology was consulted. On interview, patient denied having any cardiac history.  Reported that over 30 years ago, she saw cardiologist, completed a stress test and wore a cardiac monitor.  Both studies were reportedly normal.  She denied recent episodes of chest pain, shortness of breath, palpitations, tachycardia, syncope, near syncope, dizziness. Telemetry did not show any abnormal rhythms. She was discharged with a cardiac monitor that showed an average heart rate 82 bpm ranging from 59 to 229 bpm.  Predominant underlying rhythm was sinus rhythm.  Atrial fibrillation occurred with a less than 1% burden, ranging from 110 to 229 bpm, average 151 bpm, longest episode lasting 16 minutes and 6 seconds with an average rate of 139 bpm.  Rare ectopy.  Patient was started on Eliquis  5 mg twice daily and as needed diltiazem .  Patient was last seen in clinic on 03/03/2024.  She reports that she is doing very well overall.  She denies any feelings of atrial fibrillation.  She reports that she had not yet started on  Eliquis  given concerns regarding cost.  Reviewed other methods of anticoagulation, patient declined any other forms.  She reported that she was going to reach out to her insurance company regarding further options regarding cost.  Today she presents for follow-up.  She reports that she  Atrial fibrillation/tachycardia: While undergoing left total knee replacement  on 2/25, patient became tachycardic with heart rate to the 140s.  EKG strip reportedly showed SVT versus atrial fibrillation.  There are no tracings available for review in the chart. Patient denied personal cardiac history.  Denied having any episodes of palpitations, chest pain, shortness of breath, syncope, near syncope, dizziness.  Denied  fluttering in her chest or feelings of racing heartbeat.  On telemetry that admission,  patient maintained normal sinus rhythm with heart rate in the 60s.  Only rare PVCs noted.  Cardiac monitor indicated a less than 1% burden of atrial fibrillation.  Patient started on Eliquis  and as needed diltiazem .  Today she reports that she  HLD: Lipid panel from 06/2023 showed LDL 76, HDL 74, triglycerides 94, total cholesterol 168. Continue pravastatin  20 mg daily.   Labwork independently reviewed:   ROS: .   *** denies chest pain, shortness of breath, lower extremity edema, fatigue, palpitations, melena, hematuria, hemoptysis, diaphoresis, weakness, presyncope, syncope, orthopnea, and PND.  All other systems are reviewed and otherwise negative.  Studies Reviewed: Aaron Aas    EKG:  EKG is ordered today, personally reviewed, demonstrating ***     CV Studies: Cardiac studies reviewed are outlined and summarized above. Otherwise please see EMR for full report. Cardiac Studies &  Procedures   ______________________________________________________________________________________________     ECHOCARDIOGRAM  ECHOCARDIOGRAM COMPLETE 04/04/2024  Narrative ECHOCARDIOGRAM REPORT    Patient Name:   [name]  [facility] Date of Exam: 04/04/2024 Medical Rec #:  [id number]          Height:       62.5 in Accession #:    [id number]         Weight:       144.2 lb Date of Birth:  [birth year]         BSA:          1.674 m Patient Age:    82 years           BP:           136/80 mmHg Patient Gender: F                  HR:           73 bpm. Exam Location:  [address]  Procedure: 2D Echo, Cardiac Doppler, Color Doppler and 3D Echo (Both Spectral and Color Flow Doppler were utilized during procedure).  Indications:    Atrial Fibrillation I48.0  History:        Patient has no prior history of Echocardiogram examinations. Risk Factors:Hypertension.  Sonographer:    Joleen Navy RDCS Referring Phys: [id number] Tomica Arseneault D Thadd Apuzzo  IMPRESSIONS   1. Left ventricular ejection fraction, by estimation, is 55 to 60%. Left ventricular ejection fraction by 3D volume is 56 %. The left ventricle has normal function. The left ventricle has no regional wall motion abnormalities. Left ventricular diastolic parameters are consistent with Grade I diastolic dysfunction (impaired relaxation). 2. Right ventricular systolic function is normal. The right ventricular size is normal. Tricuspid regurgitation signal is inadequate for assessing PA pressure. 3. The mitral valve is grossly normal. Trivial mitral valve regurgitation. 4. The aortic valve is tricuspid. Aortic valve regurgitation is not visualized. No aortic stenosis is present. 5. The inferior vena cava is normal in size with greater than 50% respiratory variability, suggesting right atrial pressure of 3 mmHg. 6. Rhythm strip during this exam demonstrates normal sinus rhythm.  Comparison(s): No prior Echocardiogram.  FINDINGS Left Ventricle: Left ventricular ejection fraction, by estimation, is 55 to 60%. Left ventricular ejection fraction by 3D volume is 56 %. The left ventricle has normal function. The left ventricle has no regional wall motion abnormalities. The  left ventricular internal cavity size was normal in size. There is no left ventricular hypertrophy. Left ventricular diastolic parameters are consistent with Grade I diastolic dysfunction (impaired relaxation). Indeterminate filling pressures.  Right Ventricle: The right ventricular size is normal. No increase in right ventricular wall thickness. Right ventricular systolic function is normal. Tricuspid regurgitation signal is inadequate for assessing PA pressure.  Left Atrium: Left atrial size was normal in size.  Right Atrium: Right atrial size was normal in size.  Pericardium: There is no evidence of pericardial effusion.  Mitral Valve: The mitral valve is grossly normal. Trivial mitral valve regurgitation.  Tricuspid Valve: The tricuspid valve is grossly normal. Tricuspid valve regurgitation is trivial.  Aortic Valve: The aortic valve is tricuspid. Aortic valve regurgitation is not visualized. No aortic stenosis is present.  Pulmonic Valve: The pulmonic valve was normal in structure. Pulmonic valve regurgitation is not visualized.  Aorta: The aortic root and ascending aorta are structurally normal, with no evidence of dilitation.  Venous: The inferior vena cava is normal in size with greater than 50% respiratory variability, suggesting  right atrial pressure of 3 mmHg.  IAS/Shunts: No atrial level shunt detected by color flow Doppler.  EKG: Rhythm strip during this exam demonstrates normal sinus rhythm.  Additional Comments: 3D was performed not requiring image post processing on an independent workstation and was normal.   LEFT VENTRICLE PLAX 2D LVIDd:         4.50 cm         Diastology LVIDs:         3.20 cm         LV e' medial:    8.38 cm/s LV PW:         1.00 cm         LV E/e' medial:  7.3 LV IVS:        0.70 cm         LV e' lateral:   9.79 cm/s LVOT diam:     1.80 cm         LV E/e' lateral: 6.3 LV SV:         54 LV SV Index:   32 LVOT Area:     2.54 cm        3D  Volume EF LV 3D EF:    Left ventricul ar ejection fraction by 3D volume is 56 %.  3D Volume EF: 3D EF:        56 % LV EDV:       112 ml LV ESV:       49 ml LV SV:        62 ml  RIGHT VENTRICLE RV Basal diam:  3.80 cm RV Mid diam:    3.10 cm RV S prime:     14.90 cm/s TAPSE (M-mode): 2.4 cm  LEFT ATRIUM             Index        RIGHT ATRIUM           Index LA diam:        4.00 cm 2.39 cm/m   RA Area:     14.10 cm LA Vol (A2C):   36.5 ml 21.81 ml/m  RA Volume:   35.30 ml  21.09 ml/m LA Vol (A4C):   37.3 ml 22.29 ml/m LA Biplane Vol: 37.5 ml 22.41 ml/m AORTIC VALVE LVOT Vmax:   98.50 cm/s LVOT Vmean:  69.200 cm/s LVOT VTI:    0.213 m  AORTA Ao Root diam: 2.80 cm Ao Asc diam:  3.50 cm  MITRAL VALVE MV Area (PHT): 3.03 cm    SHUNTS MV Decel Time: 250 msec    Systemic VTI:  0.21 m MV E velocity: 61.30 cm/s  Systemic Diam: 1.80 cm MV A velocity: 99.00 cm/s MV E/A ratio:  0.62  Dinah Franco MD Electronically signed by Dinah Franco MD Signature Date/Time: 04/04/2024/4:01:03 PM    Final    MONITORS  LONG TERM MONITOR (3-14 DAYS) 02/18/2024  Narrative <1 % burden of atrial fibrillation. Patch Wear Time:  13 days and 23 hours (2025-03-09T14:14:13-0400 to 2025-03-23T13:23:54-0400)  Patient had a min HR of 59 bpm, max HR of 229 bpm, and avg HR of 82 bpm. Predominant underlying rhythm was Sinus Rhythm. Atrial Fibrillation occurred (<1% burden), ranging from 110-229 bpm (avg of 151 bpm), the longest lasting 16 mins 6 secs with an avg rate of 139 bpm. Isolated SVEs were rare (<1.0%), SVE Couplets were rare (<1.0%), and SVE Triplets were rare (<1.0%). Isolated VEs were occasional (2.8%, [id number]), VE Couplets were rare (<1.0%,  758), and VE Triplets were rare (<1.0%, 5). Ventricular Bigeminy and Trigeminy were present. MD notification criteria for Rapid Atrial Fibrillation met - report posted prior to notification (MS).        ______________________________________________________________________________________________       Current Reported Medications:.    No outpatient medications have been marked as taking for the 04/25/24 encounter (Appointment) with Aryon Nham D, NP.    Physical Exam:    VS:  There were no vitals taken for this visit.   Wt Readings from Last 3 Encounters:  03/29/24 144 lb 3.2 oz (65.4 kg)  03/03/24 144 lb 9.6 oz (65.6 kg)  02/29/24 144 lb 3.2 oz (65.4 kg)    GEN: Well nourished, well developed in no acute distress NECK: No JVD; No carotid bruits CARDIAC: ***RRR, no murmurs, rubs, gallops RESPIRATORY:  Clear to auscultation without rales, wheezing or rhonchi  ABDOMEN: Soft, non-tender, non-distended EXTREMITIES:  No edema; No acute deformity     Asessement and Plan:.     ***     Disposition: F/u with ***  Signed, Mansoor Hillyard D Kelon Easom, NP

## 2024-04-25 ENCOUNTER — Telehealth: Payer: Self-pay

## 2024-04-25 ENCOUNTER — Other Ambulatory Visit (HOSPITAL_COMMUNITY): Payer: Self-pay

## 2024-04-25 ENCOUNTER — Ambulatory Visit: Attending: Cardiology | Admitting: Cardiology

## 2024-04-25 ENCOUNTER — Encounter: Payer: Self-pay | Admitting: Cardiology

## 2024-04-25 VITALS — BP 128/64 | HR 67 | Ht 62.5 in | Wt 149.2 lb

## 2024-04-25 DIAGNOSIS — I480 Paroxysmal atrial fibrillation: Secondary | ICD-10-CM

## 2024-04-25 DIAGNOSIS — E785 Hyperlipidemia, unspecified: Secondary | ICD-10-CM | POA: Diagnosis not present

## 2024-04-25 DIAGNOSIS — I479 Paroxysmal tachycardia, unspecified: Secondary | ICD-10-CM

## 2024-04-25 MED ORDER — APIXABAN 5 MG PO TABS: 5.0000 mg | ORAL_TABLET | Freq: Two times a day (BID) | ORAL | Status: DC

## 2024-04-25 NOTE — Telephone Encounter (Addendum)
 Cost concern on Eliquis . 5 mg claim was paid by pharmacy so can't see cost on that test claim. 2.5 mg Eliquis  should be same tier on plan. 30 days is $152.71 and 90 days is $458.28.  Likely this patient would benefit from calling her insurance and setting up a medicare payment plan (won't qualify for PAP without paying some into deductible first).

## 2024-04-25 NOTE — Progress Notes (Signed)
 Heart and Vascular Care Navigation  04/25/2024  [name] 1940/11/18 [id number]  Reason for Referral: financial concerns, Eliquis  Patient is participating in a Managed Medicaid Plan:No, [facility]   Engaged with patient face to face for initial visit for Heart and Vascular Care Coordination.                                                                                                   Assessment:                                     LCSW met with pt at [facility] today. Introduced self, role, reason for visit. Provided my card and encouraged pt to keep that as needed. Pt resides alone, has supportive sister and adult son. 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 She enjoys her work, hasn't been able to do it due to a knee issue since earlier this year and now school is out for summer. Pt responsible for her mortgage, car payments and other payments. She currently receives [employer] but when she is not working/receiving additional income it can be tight covering bills and she has had to receive assistance from her family. We discussed that there are limited long term financial supports for mortage/utilities but there are ways to help with easing costs of living. I will send her some community resources in the mail for her to review to see if she is eligible for SNAP benefits to help with food costs, as well as information about additional agencies that may have emergency funds as needed.   Pt has applied for LIS/Extra Help program to help with pharmacy costs in April with assistance from her insurance rep but still hasn't received any updates per her report. Confirmed she had number for social security so she can call and see if they have processed her application/any determination at this time. If not eligible (perhaps due to additional income this year) we discussed that pt should speak with SHIIP counselors at the [facility] for  assistance or her insurance rep about Medicare payment plan option. She will not be eligible for manufacturers assistance until she has also met 3% OOP expenses (she has not spent anything on her Eliquis  thus far). If not willing/able to do Medicare payment plan then she will need to speak with her care team about options for other medications.    No additional questions for me today. Will send resources and f/u in the next few weeks to see if any updates.   HRT/VAS Care Coordination     Patients Home Cardiology Office --  [facility]   Outpatient Care Team Social Worker   Social Worker Name: Nathen Balder, [place], [phone]   Living arrangements for the past 2 months Single Family Home   Lives with: Self   Patient Current Insurance Coverage Managed Medicare   Patient Has Concern With Paying Medical Bills No   Does Patient Have Prescription Coverage? Yes  Home Assistive Devices/Equipment Blood pressure cuff   DME Agency [employer]   HH Agency NA       Social History:                                                                             SDOH Screenings   Food Insecurity: Food Insecurity Present (04/25/2024)  Housing: Low Risk  (04/25/2024)  Transportation Needs: No Transportation Needs (04/25/2024)  Utilities: Not At Risk (04/25/2024)  Alcohol Screen: Low Risk  (08/26/2023)  Depression (PHQ2-9): Low Risk  (03/29/2024)  Financial Resource Strain: Low Risk  (04/25/2024)  Physical Activity: Insufficiently Active (06/10/2023)  Social Connections: Moderately Integrated (01/12/2024)  Stress: Stress Concern Present (04/25/2024)  Tobacco Use: Low Risk  (04/25/2024)  Health Literacy: Adequate Health Literacy (04/25/2024)    SDOH Interventions: Financial Resources:  Surveyor, quantity Strain Interventions: Programmer, applications Provided DSS for financial assistance and TEFL teacher as needed for referrals and community assistance  Food Insecurity:  Food Insecurity Interventions: [employer] Provided- sent card for [employer] assistance should pt be interested in applying for food assistance  Housing Insecurity:  Housing Interventions: [employer] Provided- pt family currently assisting, will Engineer, petroleum and [employer]; we discussed that there are not long term mortgage assistance options at this time  Transportation:   Transportation Interventions: Intervention Not Indicated, Patient Resources (Friends/Family)     Other Care Navigation Interventions:     Provided Pharmacy assistance resources  Pt shared she applied for LIS/Extra Help but that was in April and she still hasn't heard back- if continues to be out of work may also qualify for [employer]. LCSW discussed how to contact social security to get letter, if not eligible still will not be eligible for PAP until she spends 3% OOP; therefore discussed having   Patient expressed Mental Health concerns Yes, Referred to:  (general stress) pt currently managed by PCP for mental health medications; at this time remain available should pt have additional resources they are interested in for support   Follow-up plan:   LCSW mailed pt [employer] with [employer] contact information, food pantry list, [employer] card from [employer] to access benefits and a list of resources from [employer]. Was given my card at appt and plans to call Social Security to see if any information about her Extra Help application is available, will f/u to see what the outcome is and answer any additional questions.

## 2024-04-25 NOTE — Patient Instructions (Addendum)
 Medication Instructions:  No changes *If you need a refill on your cardiac medications before your next appointment, please call your pharmacy*  Lab Work: No labs  Testing/Procedures: No testing  Follow-Up: At [facility], you and your health needs are our priority.  As part of our continuing mission to provide you with exceptional heart care, our providers are all part of one team.  This team includes your primary Cardiologist (physician) and Advanced Practice Providers or APPs (Physician Assistants and Nurse Practitioners) who all work together to provide you with the care you need, when you need it.  Your next appointment:   4-5 month(s)  Provider:   Sammy Crisp MD or Belva Boyden MD  We recommend signing up for the patient portal called "MyChart".  Sign up information is provided on this After Visit Summary.  MyChart is used to connect with patients for Virtual Visits (Telemedicine).  Patients are able to view lab/test results, encounter notes, upcoming appointments, etc.  Non-urgent messages can be sent to your provider as well.   To learn more about what you can do with MyChart, go to [email or web address].   Other Instructions: Social worker will follow up

## 2024-05-02 ENCOUNTER — Other Ambulatory Visit (HOSPITAL_COMMUNITY): Payer: Self-pay

## 2024-05-02 ENCOUNTER — Telehealth: Payer: Self-pay | Admitting: Licensed Clinical Social Worker

## 2024-05-02 NOTE — Telephone Encounter (Signed)
 H&V Care Navigation CSW Progress Note  Clinical Social Worker contacted patient by phone to f/u on Extra Help application and check if interested in any additional resources. Pt reached at [phone]. She shares that she called Social Security and her application is still pending for Extra Help/LIS. Pt shares she is going out of town and needs more samples.   I explained that I could route this to the provider to discuss next steps- inquired if she was aware of if she met her deductible If it would lower her costs. She is not sure, encouraged her to contact her [facility] rep/whoever has been helping her with her insurance and see if it may bring those costs down. Pt also has been advised to inquire about Medicare Payment Plan which can be set up through her [facility]. Pt does not meet Medicaid criteria at this time so unfortunately those are her options other than pursuing other medication options. Pt not keen on alternatives and requests samples again until her application is processed.   Patient is participating in a Managed Medicaid Plan:  No, Aetna Medicare  SDOH Screenings   Food Insecurity: Food Insecurity Present (04/25/2024)  Housing: Low Risk  (04/25/2024)  Transportation Needs: No Transportation Needs (04/25/2024)  Utilities: Not At Risk (04/25/2024)  Alcohol Screen: Low Risk  (08/26/2023)  Depression (PHQ2-9): Low Risk  (03/29/2024)  Financial Resource Strain: Low Risk  (04/25/2024)  Physical Activity: Insufficiently Active (06/10/2023)  Social Connections: Moderately Integrated (01/12/2024)  Stress: Stress Concern Present (04/25/2024)  Tobacco Use: Low Risk  (04/25/2024)  Health Literacy: Adequate Health Literacy (04/25/2024)    Nathen Balder, MSW, LCSW Clinical Social Worker II [facility] Heart/Vascular Care Navigation  [phone]- work cell phone (preferred)

## 2024-05-03 ENCOUNTER — Other Ambulatory Visit: Payer: Self-pay

## 2024-05-03 ENCOUNTER — Telehealth: Payer: Self-pay

## 2024-05-03 ENCOUNTER — Other Ambulatory Visit (HOSPITAL_COMMUNITY): Payer: Self-pay

## 2024-05-03 MED ORDER — APIXABAN 5 MG PO TABS: 5.0000 mg | ORAL_TABLET | Freq: Two times a day (BID) | ORAL | 0 refills | Status: DC

## 2024-05-03 NOTE — Telephone Encounter (Signed)
 Called Mrs. [name] Mirza to again discuss her anticoagulation.  She reports that she believes she received a call from Social Security this morning and has possibly been approved for LIS.  However she notes that she tried to call the number back and did not receive an answer. Again provided phone number initially provided this morning from clinical social work and encouraged patient to call regarding status of application.  Patient reports that she had only called her insurance provider this morning regarding a Medicare placement plan and has not yet had a response.  Patient has been provided again with samples, discussed with patient that if she is unable to afford Eliquis  and is denied LIS that we would have to consider transitioning to Coumadin as we are unable to provide any further samples.  Patient stated  she is not interested in discussing Coumadin at this time.  Patient states that she will notify the office of her LIS status and if she is able to afford Eliquis  with the payment plan.  All patient questions and concerns were addressed, she was appreciative of call.

## 2024-05-03 NOTE — Addendum Note (Signed)
 Addended by: Tomie Foyer A on: 05/03/2024 12:05 PM   Modules accepted: Orders

## 2024-05-03 NOTE — Telephone Encounter (Signed)
 H&V Care Navigation CSW Progress Note  Clinical Social Worker received a call from pt to share that she thinks she received a call back from social security regarding her application but isn't sure. I shared that she should call [phone] directly rather than calling the number that pops up to prevent any scams/fraud. She has been in touch with her insurance agent to discuss Medicare Payment Plan and her deductible. Still interested in samples, will likely have her son pick up.   I will route this request to appropriate team, also has been previously routed to provider.   Patient is participating in a Managed Medicaid Plan:  No, Aetna Medicare  SDOH Screenings   Food Insecurity: Food Insecurity Present (04/25/2024)  Housing: Low Risk  (04/25/2024)  Transportation Needs: No Transportation Needs (04/25/2024)  Utilities: Not At Risk (04/25/2024)  Alcohol Screen: Low Risk  (08/26/2023)  Depression (PHQ2-9): Low Risk  (03/29/2024)  Financial Resource Strain: Low Risk  (04/25/2024)  Physical Activity: Insufficiently Active (06/10/2023)  Social Connections: Moderately Integrated (01/12/2024)  Stress: Stress Concern Present (04/25/2024)  Tobacco Use: Low Risk  (04/25/2024)  Health Literacy: Adequate Health Literacy (04/25/2024)   Nathen Balder, MSW, LCSW Clinical Social Worker II [facility] Heart/Vascular Care Navigation  [phone]- work cell phone (preferred)

## 2024-05-03 NOTE — Telephone Encounter (Signed)
 Pharmacy Patient Advocate Encounter  Insurance verification completed.   The patient is insured through [employer] test claim for [employer]. Currently a quantity of 60 is a 30 day supply and the co-pay is NA . DRUG IS NON FORM ON PLAN.   Plan prefers Eliquis  or Xarelto

## 2024-05-03 NOTE — Telephone Encounter (Signed)
 Pharmacy Patient Advocate Encounter  Insurance verification completed.   The patient is insured through [employer] claim for XARELTO. Currently a quantity of 30 is a 30 day supply and the co-pay is NA . DUR REJECT CAN'T OVERRIDE.    INSURANCE PAID A DOAC CLAIM ON 04/23/24, TOO SOON TO RUN EVEN WITH DUR TD OVERRIDE. Xarelto is the same tier on plan so cost will be similar to Eliquis .

## 2024-05-31 ENCOUNTER — Other Ambulatory Visit: Payer: Self-pay | Admitting: Family Medicine

## 2024-05-31 DIAGNOSIS — E785 Hyperlipidemia, unspecified: Secondary | ICD-10-CM

## 2024-06-12 DIAGNOSIS — H401132 Primary open-angle glaucoma, bilateral, moderate stage: Secondary | ICD-10-CM | POA: Diagnosis not present

## 2024-06-15 ENCOUNTER — Ambulatory Visit: Payer: Medicare HMO

## 2024-06-15 DIAGNOSIS — Z0000 Encounter for general adult medical examination without abnormal findings: Secondary | ICD-10-CM | POA: Diagnosis not present

## 2024-06-15 NOTE — Progress Notes (Signed)
 Subjective:   [name] is a 83 y.o. who presents for a Medicare Wellness preventive visit.  As a reminder, Annual Wellness Visits don't include a physical exam, and some assessments may be limited, especially if this visit is performed virtually. We may recommend an in-person follow-up visit with your provider if needed.  Visit Complete: Virtual I connected with  [name] on 06/15/24 by a audio enabled telemedicine application and verified that I am speaking with the correct person using two identifiers.  Patient Location: Home  Provider Location: Home Office  I discussed the limitations of evaluation and management by telemedicine. The patient expressed understanding and agreed to proceed.  Vital Signs: Because this visit was a virtual/telehealth visit, some criteria may be missing or patient reported. Any vitals not documented were not able to be obtained and vitals that have been documented are patient reported.  VideoDeclined- This patient declined Librarian, academic. Therefore the visit was completed with audio only.  Persons Participating in Visit: Patient.  AWV Questionnaire: No: Patient Medicare AWV questionnaire was not completed prior to this visit.  Cardiac Risk Factors include: advanced age (>48men, >75 women);dyslipidemia;sedentary lifestyle     Objective:    There were no vitals filed for this visit. There is no height or weight on file to calculate BMI.     06/15/2024   12:52 PM 01/11/2024   10:01 AM 12/29/2023    1:59 PM 06/10/2023    1:45 PM 03/26/2023    3:52 PM 01/05/2023    9:12 AM 09/10/2021    8:39 AM  Advanced Directives  Does Patient Have a Medical Advance Directive? No Yes Yes Yes Yes Yes Yes  Type of Furniture conservator/restorer;Living will Healthcare Power of [place];Living will Healthcare Power of [place];Living will Healthcare Power of [place];Living will  Living will  Does  patient want to make changes to medical advance directive?  No - Patient declined       Copy of Healthcare Power of Attorney in Chart?  No - copy requested No - copy requested No - copy requested     Would patient like information on creating a medical advance directive? No - Patient declined          Current Medications (verified) Outpatient Encounter Medications as of 06/15/2024  Medication Sig   acetaminophen  (TYLENOL ) 500 MG tablet Take 500-1,000 mg by mouth every 6 (six) hours as needed for mild pain (pain score 1-3) or moderate pain (pain score 4-6).   apixaban  (ELIQUIS ) 5 MG TABS tablet Take 1 tablet (5 mg total) by mouth 2 (two) times daily.   brimonidine  (ALPHAGAN ) 0.2 % ophthalmic solution Place 1 drop into both eyes 3 (three) times daily.   buPROPion  (WELLBUTRIN  XL) 150 MG 24 hr tablet TAKE 1 TABLET (150 MG TOTAL) BY MOUTH IN THE MORNING   citalopram  (CELEXA ) 10 MG tablet Take 1 tablet (10 mg total) by mouth daily.   diltiazem  (CARDIZEM ) 30 MG tablet TAKE 1 TABLET 4 TIMES A DAY AS NEEDED FOR PALPITATIONS OR SUSTAINED HEART RATE GREATER THEN 110.   fluticasone  (FLONASE ) 50 MCG/ACT nasal spray Place 2 sprays into both nostrils daily as needed for allergies or rhinitis.   latanoprost  (XALATAN ) 0.005 % ophthalmic solution Place 1 drop into both eyes at bedtime.   omeprazole  (PRILOSEC) 40 MG capsule TAKE 1 CAPSULE (40 MG TOTAL) BY MOUTH DAILY.   Polyvinyl Alcohol-Povidone (REFRESH OP) Place 2 drops into both eyes 3 (  three) times daily as needed (dry eye).   pravastatin  (PRAVACHOL ) 20 MG tablet TAKE 1 TABLET BY MOUTH EVERY DAY   vitamin B-12 (CYANOCOBALAMIN ) 500 MCG tablet Take 500 mcg by mouth daily.   apixaban  (ELIQUIS ) 5 MG TABS tablet Take 1 tablet (5 mg total) by mouth 2 (two) times daily.   apixaban  (ELIQUIS ) 5 MG TABS tablet Take 1 tablet (5 mg total) by mouth 2 (two) times daily.   Cholecalciferol (VITAMIN D3) 50 MCG (2000 UT) capsule Take 1 capsule (2,000 Units total) by mouth  daily. (Patient not taking: Reported on 06/15/2024)   [DISCONTINUED] Guaifenesin  (MUCINEX  MAXIMUM STRENGTH) 1200 MG TB12 Take 1,200 mg by mouth 2 (two) times daily as needed (congestion).   [DISCONTINUED] polyethylene glycol (MIRALAX  / GLYCOLAX ) 17 g packet Take 17 g by mouth 2 (two) times daily.   No facility-administered encounter medications on file as of 06/15/2024.    Allergies (verified) Biphosphate, Codeine, Combigan [brimonidine  tartrate-timolol], Sulfa antibiotics, and Metronidazole   History: Past Medical History:  Diagnosis Date   Anemia    Anxiety    no meds   Arthritis    hands, knees - no meds   Bilateral leg weakness    Complication of anesthesia    Difficult to arouse   Dental bridge present    perm lower front bottom bridge   Depression    no meds   Eyes swollen    and red - was seen by MD for possible allergy to new eye drops   GERD (gastroesophageal reflux disease)    diet controlled - only takes med prn basis   Glaucoma    Hypercholesteremia    Hypertension    Migraine headache    Osteoporosis    SVD (spontaneous vaginal delivery)    x 3 - only one currently living   Vertigo    Past Surgical History:  Procedure Laterality Date   ABDOMINAL HYSTERECTOMY     ANAL FISSURE REPAIR  1970s   for bleeding in 1970s   ANTERIOR AND POSTERIOR REPAIR WITH SACROSPINOUS FIXATION N/A 05/30/2018   Procedure: ANTERIOR AND POSTERIOR REPAIR;  Surgeon: Marget Lenis, MD;  Location: [facility];  Service: Gynecology;  Laterality: N/A;   bilateral cataracts removed Bilateral    COLONOSCOPY  2012   [facility]   COLONOSCOPY WITH PROPOFOL  N/A 09/01/2016   Procedure: COLONOSCOPY WITH PROPOFOL ;  Surgeon: Reyes LELON Cota, MD;  Location: [facility];  Service: Endoscopy;  Laterality: N/A;   COLONOSCOPY WITH PROPOFOL  N/A 09/10/2021   Procedure: COLONOSCOPY WITH PROPOFOL ;  Surgeon: Cota Reyes LELON, MD;  Location: [facility];  Service: Endoscopy;  Laterality: N/A;    ESOPHAGOGASTRODUODENOSCOPY (EGD) WITH PROPOFOL  N/A 01/05/2023   Procedure: ESOPHAGOGASTRODUODENOSCOPY (EGD) WITH PROPOFOL ;  Surgeon: Unk Corinn Skiff, MD;  Location: [facility];  Service: Gastroenterology;  Laterality: N/A;   EYE SURGERY Bilateral    cataracts removed   KNEE SURGERY Left    SALPINGOOPHORECTOMY Right 05/30/2018   Procedure: SALPINGO OOPHORECTOMY;  Surgeon: Marget Lenis, MD;  Location: [facility];  Service: Gynecology;  Laterality: Right;   SUBCLAVIAN ANGIOGRAM     TOTAL HIP ARTHROPLASTY Left 01/28/2021   Procedure: TOTAL HIP ARTHROPLASTY ANTERIOR APPROACH;  Surgeon: Kathlynn Sharper, MD;  Location: [facility];  Service: Orthopedics;  Laterality: Left;   TOTAL KNEE ARTHROPLASTY Right 04/02/2020   Procedure: TOTAL KNEE ARTHROPLASTY;  Surgeon: Ernie Cough, MD;  Location: [facility];  Service: Orthopedics;  Laterality: Right;  70 mins   TOTAL KNEE ARTHROPLASTY Left 01/11/2024  Procedure: TOTAL KNEE ARTHROPLASTY;  Surgeon: Ernie Cough, MD;  Location: [facility];  Service: Orthopedics;  Laterality: Left;  70 mins   UPPER GI ENDOSCOPY  2012   VAGINAL HYSTERECTOMY N/A 05/30/2018   Procedure: HYSTERECTOMY VAGINAL;  Surgeon: Marget Lenis, MD;  Location: [facility];  Service: Gynecology;  Laterality: N/A;   WISDOM TOOTH EXTRACTION     Family History  Problem Relation Age of Onset   Cancer Mother 10       colon   Alzheimer's disease Mother    Hypertension Mother    Prostate cancer Father    Hypertension Father    Cerebral aneurysm Father    Diabetes Sister    Depression Sister    Hypertension Sister    Depression Sister    Depression Brother    Cancer Brother        prostate   Cancer Brother    Gallbladder disease Brother    Prostate cancer Brother    Lymphoma Son    Cancer Son 48       lymphoma   Breast cancer Neg Hx    Social History   Socioeconomic History   Marital status: Widowed    Spouse name: Beryl   Number of children: 3   Years of education: Not on file   Highest  education level: 12th grade  Occupational History    Employer: [employer]    Comment: retired  Tobacco Use   Smoking status: Never   Smokeless tobacco: Never   Tobacco comments:    smoking cessation materials not required  Vaping Use   Vaping status: Never Used  Substance and Sexual Activity   Alcohol use: Yes    Comment: Rare   Drug use: No   Sexual activity: Yes    Partners: Male    Birth control/protection: Surgical  Other Topics Concern   Not on file  Social History Narrative   Pt lives alone. Significant other passed away in 2020/11/22  Social Drivers of Health   Financial Resource Strain: Low Risk  (06/15/2024)   Overall Financial Resource Strain (CARDIA)    Difficulty of Paying Living Expenses: Not hard at all  Food Insecurity: No Food Insecurity (06/15/2024)   Hunger Vital Sign    Worried About Running Out of Food in the Last Year: Never true    Ran Out of Food in the Last Year: Never true  Recent Concern: Food Insecurity - Food Insecurity Present (04/25/2024)   Hunger Vital Sign    Worried About Running Out of Food in the Last Year: Sometimes true    Ran Out of Food in the Last Year: Never true  Transportation Needs: No Transportation Needs (06/15/2024)   PRAPARE - Administrator, Civil Service (Medical): No    Lack of Transportation (Non-Medical): No  Physical Activity: Insufficiently Active (06/15/2024)   Exercise Vital Sign    Days of Exercise per Week: 2 days    Minutes of Exercise per Session: 30 min  Stress: No Stress Concern Present (06/15/2024)   [employer] of Occupational Health - Occupational Stress Questionnaire    Feeling of Stress: Only a little  Recent Concern: Stress - Stress Concern Present (04/25/2024)   [employer] of Occupational Health - Occupational Stress Questionnaire    Feeling of Stress : To some extent  Social Connections: Socially Isolated (06/15/2024)   Social Connection and Isolation Panel    Frequency of  Communication with Friends and Family: More than three  times a week    Frequency of Social Gatherings with Friends and Family: More than three times a week    Attends Religious Services: Never    Database administrator or Organizations: No    Attends Banker Meetings: Never    Marital Status: Widowed    Tobacco Counseling Counseling given: Not Answered Tobacco comments: smoking cessation materials not required    Clinical Intake:  Pre-visit preparation completed: Yes  Pain : No/denies pain     BMI - recorded: 26.8 Nutritional Status: BMI 25 -29 Overweight Nutritional Risks: None Diabetes: No  Lab Results  Component Value Date   HGBA1C 5.3 06/04/2022   HGBA1C 5.4 01/01/2020   HGBA1C 5.4 06/30/2019     How often do you need to have someone help you when you read instructions, pamphlets, or other written materials from your doctor or pharmacy?: 1 - Never  Interpreter Needed?: No  Information entered by :: JHONNIE DAS, LPN   Activities of Daily Living    06/15/2024   12:53 PM 01/12/2024   12:00 AM  In your present state of health, do you have any difficulty performing the following activities:  Hearing? 0 1  Vision? 0 0  Difficulty concentrating or making decisions? 0 0  Walking or climbing stairs? 0   Dressing or bathing? 0   Doing errands, shopping? 0 0  Preparing Food and eating ? N   Using the Toilet? N   In the past six months, have you accidently leaked urine? Y   Do you have problems with loss of bowel control? N   Managing your Medications? N   Managing your Finances? N   Housekeeping or managing your Housekeeping? N     Patient Care Team: Sowles, Krichna, MD as PCP - General (Family Medicine) Alvan Ronal BRAVO, MD (Inactive) as PCP - Cardiology (Cardiology) Lenn Standing, MD as Consulting Physician (Ophthalmology) Marget Lenis, MD as Consulting Physician (Obstetrics and Gynecology)  I have updated your Care Teams any recent  Medical Services you may have received from other providers in the past year.     Assessment:   This is a routine wellness examination for [name].  Hearing/Vision screen Hearing Screening - Comments:: NO AIDS Vision Screening - Comments:: WEARS GLASSES ALL DAY- DR.DINGELDEIN- APPT THIS WEEK   Goals Addressed             This Visit's Progress    DIET - EAT MORE FRUITS AND VEGETABLES         Depression Screen     06/15/2024   12:50 PM 03/29/2024    2:49 PM 01/25/2024    3:24 PM 12/28/2023    9:20 AM 10/01/2023    3:50 PM 08/26/2023    3:16 PM 06/25/2023    1:54 PM  PHQ 2/9 Scores  PHQ - 2 Score 0 0 0 2 0 0 1  PHQ- 9 Score 0 0 0 4   1    Fall Risk     06/15/2024   12:53 PM 03/29/2024    2:45 PM 12/28/2023    9:16 AM 10/01/2023    3:48 PM 08/26/2023    3:15 PM  Fall Risk   Falls in the past year? 0 0 0 1 0  Number falls in past yr: 0 0 0 0 0  Injury with Fall? 0 0 0 0 0  Risk for fall due to : No Fall Risks No Fall Risks No Fall Risks Medication side effect;Orthopedic patient  No Fall Risks  Follow up Falls evaluation completed;Falls prevention discussed Falls prevention discussed;Education provided;Falls evaluation completed Falls prevention discussed;Education provided;Falls evaluation completed Falls prevention discussed Falls prevention discussed    MEDICARE RISK AT HOME:  Medicare Risk at Home Any stairs in or around the home?: Yes If so, are there any without handrails?: No Home free of loose throw rugs in walkways, pet beds, electrical cords, etc?: Yes Adequate lighting in your home to reduce risk of falls?: Yes Life alert?: No Use of a cane, walker or w/c?: No Grab bars in the bathroom?: No Shower chair or bench in shower?: No Elevated toilet seat or a handicapped toilet?: Yes  TIMED UP AND GO:  Was the test performed?  No  Cognitive Function: 6CIT completed        06/15/2024   12:56 PM 06/10/2023    1:46 PM 06/04/2022   10:29 AM 01/25/2020    1:46 PM  02/08/2018    2:20 PM  6CIT Screen  What Year? 0 points 0 points 0 points 0 points 0 points  What month? 0 points 0 points 0 points 0 points 0 points  What time? 0 points 0 points 0 points 0 points 0 points  Count back from 20 0 points 0 points 0 points 0 points 0 points  Months in reverse 0 points 0 points 0 points 0 points 0 points  Repeat phrase 2 points 0 points 0 points 0 points 0 points  Total Score 2 points 0 points 0 points 0 points 0 points    Immunizations Immunization History  Administered Date(s) Administered   Fluad Quad(high Dose 65+) 09/05/2019, 08/26/2020, 07/23/2021, 07/18/2022   Influenza, High Dose Seasonal PF 12/25/2015, 10/20/2016, 10/15/2017, 12/30/2018   Influenza,inj,Quad PF,6+ Mos 09/04/2014   Moderna Sars-Covid-2 Vaccination 10/27/2020   PFIZER(Purple Top)SARS-COV-2 Vaccination 02/10/2020, 03/01/2020   Pneumococcal Conjugate-13 03/01/2017   Pneumococcal Polysaccharide-23 05/14/2014   Tdap 11/17/2011   Zoster Recombinant(Shingrix ) 07/18/2022, 10/13/2022    Screening Tests Health Maintenance  Topic Date Due   COVID-19 Vaccine (4 - 2024-25 season) 07/18/2023   DTaP/Tdap/Td (2 - Td or Tdap) 12/27/2024 (Originally 11/16/2021)   INFLUENZA VACCINE  06/16/2024   Medicare Annual Wellness (AWV)  06/15/2025   Pneumococcal Vaccine: 50+ Years  Completed   DEXA SCAN  Completed   Zoster Vaccines- Shingrix   Completed   Hepatitis B Vaccines  Aged Out   HPV VACCINES  Aged Out   Meningococcal B Vaccine  Aged Out   Hepatitis C Screening  Discontinued    Health Maintenance  Health Maintenance Due  Topic Date Due   COVID-19 Vaccine (4 - 2024-25 season) 07/18/2023   Health Maintenance Items Addressed: Mammogram scheduled FOR OCTOBER; DECLINES BDS & COVID SHOTS; NEED PNA & TDAP  Additional Screening:  Vision Screening: Recommended annual ophthalmology exams for early detection of glaucoma and other disorders of the eye. Would you like a referral to an eye doctor?  No    Dental Screening: Recommended annual dental exams for proper oral hygiene  Community Resource Referral / Chronic Care Management: CRR required this visit?  No   CCM required this visit?  No   Plan:    I have personally reviewed and noted the following in the patient's chart:   Medical and social history Use of alcohol, tobacco or illicit drugs  Current medications and supplements including opioid prescriptions. Patient is not currently taking opioid prescriptions. Functional ability and status Nutritional status Physical activity Advanced directives List of other physicians Hospitalizations, surgeries,  and ER visits in previous 12 months Vitals Screenings to include cognitive, depression, and falls Referrals and appointments  In addition, I have reviewed and discussed with patient certain preventive protocols, quality metrics, and best practice recommendations. A written personalized care plan for preventive services as well as general preventive health recommendations were provided to patient.   Jhonnie GORMAN Das, LPN   [id number]   After Visit Summary: (MyChart) Due to this being a telephonic visit, the after visit summary with patients personalized plan was offered to patient via MyChart   Notes: Nothing significant to report at this time.

## 2024-06-15 NOTE — Patient Instructions (Signed)
 [name] , Thank you for taking time out of your busy schedule to complete your Annual Wellness Visit with me. I enjoyed our conversation and look forward to speaking with you again next year. I, as well as your care team,  appreciate your ongoing commitment to your health goals. Please review the following plan we discussed and let me know if I can assist you in the future.   Follow up Visits: 06/21/25 @ 11:30 AM BY PHONE We will see or speak with you next year for your Next Medicare AWV with our clinical staff Have you seen your provider in the last 6 months (3 months if uncontrolled diabetes)? Yes  Clinician Recommendations:  Aim for 30 minutes of exercise or brisk walking, 6-8 glasses of water , and 5 servings of fruits and vegetables each day. TAKE CARE!      This is a list of the screenings recommended for you:  Health Maintenance  Topic Date Due   COVID-19 Vaccine (4 - 2024-25 season) 07/18/2023   DTaP/Tdap/Td vaccine (2 - Td or Tdap) 12/27/2024*   Flu Shot  06/16/2024   Medicare Annual Wellness Visit  06/15/2025   Pneumococcal Vaccine for age over 4  Completed   DEXA scan (bone density measurement)  Completed   Zoster (Shingles) Vaccine  Completed   Hepatitis B Vaccine  Aged Out   HPV Vaccine  Aged Out   Meningitis B Vaccine  Aged Out   Hepatitis C Screening  Discontinued  *Topic was postponed. The date shown is not the original due date.    Advanced directives: (ACP Link)Information on Advanced Care Planning can be found at [place]  Secretary of [facility] Advance Health Care Directives Advance Health Care Directives. [email or web address]  Advance Care Planning is important because it:  [x]  Makes sure you receive the medical care that is consistent with your values, goals, and preferences  [x]  It provides guidance to your family and loved ones and reduces their decisional burden about whether or not they are making the right decisions based on your wishes.  Follow the link  provided in your after visit summary or read over the paperwork we have mailed to you to help you started getting your Advance Directives in place. If you need assistance in completing these, please reach out to us  so that we can help you!

## 2024-06-17 ENCOUNTER — Other Ambulatory Visit: Payer: Self-pay | Admitting: Family Medicine

## 2024-06-17 DIAGNOSIS — R0981 Nasal congestion: Secondary | ICD-10-CM

## 2024-07-04 DIAGNOSIS — M810 Age-related osteoporosis without current pathological fracture: Secondary | ICD-10-CM | POA: Diagnosis not present

## 2024-07-11 DIAGNOSIS — Z1331 Encounter for screening for depression: Secondary | ICD-10-CM | POA: Diagnosis not present

## 2024-07-11 DIAGNOSIS — M810 Age-related osteoporosis without current pathological fracture: Secondary | ICD-10-CM | POA: Diagnosis not present

## 2024-07-28 ENCOUNTER — Ambulatory Visit: Admitting: Family Medicine

## 2024-07-31 ENCOUNTER — Encounter: Payer: Self-pay | Admitting: Family Medicine

## 2024-07-31 ENCOUNTER — Ambulatory Visit (INDEPENDENT_AMBULATORY_CARE_PROVIDER_SITE_OTHER): Admitting: Family Medicine

## 2024-07-31 VITALS — BP 134/74 | HR 96 | Resp 16 | Ht 62.5 in | Wt 147.2 lb

## 2024-07-31 DIAGNOSIS — Z96653 Presence of artificial knee joint, bilateral: Secondary | ICD-10-CM | POA: Insufficient documentation

## 2024-07-31 DIAGNOSIS — E538 Deficiency of other specified B group vitamins: Secondary | ICD-10-CM | POA: Diagnosis not present

## 2024-07-31 DIAGNOSIS — M810 Age-related osteoporosis without current pathological fracture: Secondary | ICD-10-CM | POA: Diagnosis not present

## 2024-07-31 DIAGNOSIS — K219 Gastro-esophageal reflux disease without esophagitis: Secondary | ICD-10-CM | POA: Diagnosis not present

## 2024-07-31 DIAGNOSIS — Z23 Encounter for immunization: Secondary | ICD-10-CM | POA: Diagnosis not present

## 2024-07-31 DIAGNOSIS — E785 Hyperlipidemia, unspecified: Secondary | ICD-10-CM

## 2024-07-31 DIAGNOSIS — E559 Vitamin D deficiency, unspecified: Secondary | ICD-10-CM | POA: Diagnosis not present

## 2024-07-31 DIAGNOSIS — I480 Paroxysmal atrial fibrillation: Secondary | ICD-10-CM | POA: Diagnosis not present

## 2024-07-31 DIAGNOSIS — F339 Major depressive disorder, recurrent, unspecified: Secondary | ICD-10-CM

## 2024-07-31 NOTE — Progress Notes (Signed)
 Name: [name]   MRN: [id number]    DOB: 04/13/1941   Date:07/31/2024       Progress Note  Subjective  Chief Complaint  Chief Complaint  Patient presents with   Medical Management of Chronic Issues   Discussed the use of AI scribe software for clinical note transcription with the patient, who gave verbal consent to proceed.  History of Present Illness [name] is an 83 year old female with atrial fibrillation who presents for a regular follow-up.  She has been experiencing ongoing fatigue since her knee surgery in February 2025, which she attributes to the  diagnosis  of atrial fibrillation. She feels good at times but experiences significant tiredness during activities, necessitating breaks. 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 No shortness of breath is noted.  Her atrial fibrillation was identified during her knee surgery when her heart rate increased, and telemetry confirmed an episode with a heart rate as high as 229 bpm. She is currently on Eliquis  5 mg twice daily and diltiazem  as needed. She has been approved for an assistance program to manage the cost of Eliquis . No significant bleeding issues are reported, although she occasionally experiences gum bleeding after brushing her teeth. She also notes occasional leg swelling, which she attributes to certain types of shoes.  She has Major depression that is chronic and recurrent She takes Wellbutrin  150 mg and citalopram  10 mg for depression and reports that her mood is generally stable, although she anticipates increased emotional difficulty as October approaches due to personal loss.   She takes pravastatin  for high cholesterol and reports her reflux is well-managed.  She has undergone bilateral knee replacements and a left hip replacement. She is currently taking vitamin D  and B12 supplements, although she had previously forgotten to take B12. Her vitamin D  levels  have improved, but her B12 levels were noted to be low in recent blood work.  She reports occasional pain at the back of her head, particularly when leaning back on a chair, which she describes as a headache-like sensation. No dizziness or other associated symptoms.    Patient Active Problem List   Diagnosis Date Noted   Paroxysmal tachycardia (HCC) 01/25/2024   S/P total knee arthroplasty, left 01/11/2024   Multiple thyroid  nodules 04/09/2023   Impingement syndrome of right shoulder region 04/07/2023   Esophageal dysphagia 01/05/2023   Hiatal hernia 01/05/2023   Menopausal osteoporosis 04/08/2022   B12 deficiency 04/08/2022   History of total left hip replacement 04/08/2022   Gait instability 04/08/2022   Impaired fasting glucose    Overweight (BMI 25.0-29.9) 04/03/2020   Status post total knee replacement, right 04/02/2020   Presence of right artificial knee joint 04/02/2020   Senile purpura (HCC) 12/30/2018   S/P vaginal hysterectomy 05/30/2018   Major depression, recurrent, chronic (HCC) 03/02/2018   GERD without esophagitis 10/20/2016   Anxiety, generalized 01/28/2016   Abnormal brain CT 06/20/2015   Dyslipidemia 06/20/2015   Vertigo 06/20/2015   History of hypertension 06/20/2015   Arthralgia of shoulder 06/20/2015   Glaucoma 06/20/2015    Past Surgical History:  Procedure Laterality Date   ABDOMINAL HYSTERECTOMY     ANAL FISSURE REPAIR  1970s   for bleeding in 1970s   ANTERIOR AND POSTERIOR REPAIR WITH SACROSPINOUS FIXATION N/A 05/30/2018   Procedure: ANTERIOR AND POSTERIOR REPAIR;  Surgeon: Marget Lenis, MD;  Location: [facility];  Service: Gynecology;  Laterality: N/A;   bilateral  cataracts removed Bilateral    COLONOSCOPY  2012   [facility]   COLONOSCOPY WITH PROPOFOL  N/A 09/01/2016   Procedure: COLONOSCOPY WITH PROPOFOL ;  Surgeon: Reyes LELON Cota, MD;  Location: [facility];  Service: Endoscopy;  Laterality: N/A;   COLONOSCOPY WITH PROPOFOL  N/A 09/10/2021    Procedure: COLONOSCOPY WITH PROPOFOL ;  Surgeon: Cota Reyes LELON, MD;  Location: [facility];  Service: Endoscopy;  Laterality: N/A;   ESOPHAGOGASTRODUODENOSCOPY (EGD) WITH PROPOFOL  N/A 01/05/2023   Procedure: ESOPHAGOGASTRODUODENOSCOPY (EGD) WITH PROPOFOL ;  Surgeon: Unk Corinn Skiff, MD;  Location: [facility] END[facility]COPY;  Service: Gastroenterology;  Laterality: N/A;   EYE SURGERY Bilateral    cataracts removed   KNEE SURGERY Left    SALPINGOOPHORECTOMY Right 05/30/2018   Procedure: SALPINGO OOPHORECTOMY;  Surgeon: Marget Lenis, MD;  Location: [facility];  Service: Gynecology;  Laterality: Right;   SUBCLAVIAN ANGIOGRAM     TOTAL HIP ARTHROPLASTY Left 01/28/2021   Procedure: TOTAL HIP ARTHROPLASTY ANTERIOR APPROACH;  Surgeon: Kathlynn Sharper, MD;  Location: [facility];  Service: Orthopedics;  Laterality: Left;   TOTAL KNEE ARTHROPLASTY Right 04/02/2020   Procedure: TOTAL KNEE ARTHROPLASTY;  Surgeon: Ernie Cough, MD;  Location: [facility];  Service: Orthopedics;  Laterality: Right;  70 mins   TOTAL KNEE ARTHROPLASTY Left 01/11/2024   Procedure: TOTAL KNEE ARTHROPLASTY;  Surgeon: Ernie Cough, MD;  [facility]: [facility];  Service: Orthopedics;  Laterality: Left;  70 mins   UPPER GI ENDOSCOPY  2012   VAGINAL HYSTERECTOMY N/A 05/30/2018   Procedure: HYSTERECTOMY VAGINAL;  Surgeon: Marget Lenis, MD;  [facility];  Service: Gynecology;  Laterality: N/A;   WISDOM TOOTH EXTRACTION      Family History  Problem Relation Age of Onset   Cancer Mother 44       colon   Alzheimer's disease Mother    Hypertension Mother    Prostate cancer Father    Hypertension Father    Cerebral aneurysm Father    Diabetes Sister    Depression Sister    Hypertension Sister    Depression Sister    Depression Brother    Cancer Brother        prostate   Cancer Brother    Gallbladder disease Brother    Prostate cancer Brother    Lymphoma Son    Cancer Son 48       lymphoma   Breast cancer Neg Hx     Social History    Tobacco Use   Smoking status: Never   Smokeless tobacco: Never   Tobacco comments:    smoking cessation materials not required  Substance Use Topics   Alcohol use: Yes    Comment: Rare     Current Outpatient Medications:    acetaminophen  (TYLENOL ) 500 MG tablet, Take 500-1,000 mg by mouth every 6 (six) hours as needed for mild pain (pain score 1-3) or moderate pain (pain score 4-6)., Disp: , Rfl:    apixaban  (ELIQUIS ) 5 MG TABS tablet, Take 1 tablet (5 mg total) by mouth 2 (two) times daily., Disp: 180 tablet, Rfl: 1   brimonidine  (ALPHAGAN ) 0.2 % ophthalmic solution, Place 1 drop into both eyes 3 (three) times daily., Disp: , Rfl:    buPROPion  (WELLBUTRIN  XL) 150 MG 24 hr tablet, TAKE 1 TABLET (150 MG TOTAL) BY MOUTH IN THE MORNING, Disp: 90 tablet, Rfl: 1   citalopram  (CELEXA ) 10 MG tablet, Take 1 tablet (10 mg total) by mouth daily., Disp: 90 tablet, Rfl: 1   diltiazem  (CARDIZEM ) 30 MG tablet, TAKE  1 TABLET 4 TIMES A DAY AS NEEDED FOR PALPITATIONS OR SUSTAINED HEART RATE GREATER THEN 110., Disp: 360 tablet, Rfl: 3   fluticasone  (FLONASE ) 50 MCG/ACT nasal spray, PLACE 2 SPRAYS INTO BOTH NOSTRILS DAILY AS NEEDED FOR ALLERGIES OR RHINITIS., Disp: 48 mL, Rfl: 0   latanoprost  (XALATAN ) 0.005 % ophthalmic solution, Place 1 drop into both eyes at bedtime., Disp: , Rfl:    omeprazole  (PRILOSEC) 40 MG capsule, TAKE 1 CAPSULE (40 MG TOTAL) BY MOUTH DAILY., Disp: 90 capsule, Rfl: 1   Polyvinyl Alcohol-Povidone (REFRESH OP), Place 2 drops into both eyes 3 (three) times daily as needed (dry eye)., Disp: , Rfl:    pravastatin  (PRAVACHOL ) 20 MG tablet, TAKE 1 TABLET BY MOUTH EVERY DAY, Disp: 90 tablet, Rfl: 0   vitamin B-12 (CYANOCOBALAMIN ) 500 MCG tablet, Take 500 mcg by mouth daily., Disp: , Rfl:    apixaban  (ELIQUIS ) 5 MG TABS tablet, Take 1 tablet (5 mg total) by mouth 2 (two) times daily., Disp: , Rfl:    apixaban  (ELIQUIS ) 5 MG TABS tablet, Take 1 tablet (5 mg total) by mouth 2 (two) times  daily., Disp: 28 tablet, Rfl: 0   Cholecalciferol (VITAMIN D3) 50 MCG (2000 UT) capsule, Take 1 capsule (2,000 Units total) by mouth daily. (Patient not taking: Reported on 07/31/2024), Disp: 100 capsule, Rfl: 1  Allergies  Allergen Reactions   Biphosphate     Unknown    Codeine     headaches   Combigan [Brimonidine  Tartrate-Timolol]     drowsy   Sulfa Antibiotics Nausea And Vomiting and Nausea Only   Metronidazole Itching and Rash    I personally reviewed active problem list, medication list, allergies, family history with the patient/caregiver today.   ROS  Ten systems reviewed and is negative except as mentioned in HPI    Objective Physical Exam CONSTITUTIONAL: Patient appears well-developed and well-nourished.  No distress. HEENT: Head atraumatic, normocephalic, neck supple. CARDIOVASCULAR: Normal rate, irregular rhythm and normal heart sounds.  No murmur heard. No BLE edema. PULMONARY: Effort normal and breath sounds normal. No respiratory distress. PSYCHIATRIC: Patient has a normal mood and affect. behavior is normal. Judgment and thought content normal.  Vitals:   07/31/24 1450  BP: 134/74  Pulse: 96  Resp: 16  SpO2: 95%  Weight: 147 lb 3.2 oz (66.8 kg)  Height: 5' 2.5 (1.588 m)    Body mass index is 26.49 kg/m.    PHQ2/9:    07/31/2024    2:47 PM 06/15/2024   12:50 PM 03/29/2024    2:49 PM 01/25/2024    3:24 PM 12/28/2023    9:20 AM  Depression screen PHQ 2/9  Decreased Interest 0 0 0 0 1  Down, Depressed, Hopeless 0 0 0 0 1  PHQ - 2 Score 0 0 0 0 2  Altered sleeping 0 0 0 0 1  Tired, decreased energy 0 0 0 0 0  Change in appetite 0 0 0 0 0  Feeling bad or failure about yourself  0 0 0 0 0  Trouble concentrating 0 0 0 0 1  Moving slowly or fidgety/restless 0 0 0 0 0  Suicidal thoughts 0 0 0 0 0  PHQ-9 Score 0 0 0 0 4  Difficult doing work/chores Not difficult at all Not difficult at all Not difficult at all Not difficult at all Somewhat difficult     phq 9 is negative  Fall Risk:    07/31/2024    2:47 PM 06/15/2024  12:53 PM 03/29/2024    2:45 PM 12/28/2023    9:16 AM 10/01/2023    3:48 PM  Fall Risk   Falls in the past year? 0 0 0 0 1  Number falls in past yr: 0 0 0 0 0  Injury with Fall? 0 0 0 0 0  Risk for fall due to : No Fall Risks No Fall Risks No Fall Risks No Fall Risks Medication side effect;Orthopedic patient  Follow up Falls evaluation completed Falls evaluation completed;Falls prevention discussed Falls prevention discussed;Education provided;Falls evaluation completed Falls prevention discussed;Education provided;Falls evaluation completed Falls prevention discussed      Assessment & Plan Paroxysmal atrial fibrillation Asymptomatic on Eliquis  and diltiazem . Occasional gum bleeding likely from brushing. - Continue Eliquis  5 mg twice daily. - Use diltiazem  as needed. - Advise gentle brushing to minimize gum bleeding.  Fatigue Intermittent fatigue likely multifactorial, improves with rest. - Encourage pacing of activities and taking breaks as needed.  Bilateral knee osteoarthritis, status post bilateral knee replacement Avoids heavy lifting. - Continue current activity modifications to avoid heavy lifting.  Major depressive disorder, recurrent Anticipates distress in October. - Continue Wellbutrin  150 mg daily. - Continue citalopram  10 mg daily. - Monitor emotional state, especially as October approaches.  Age-related osteoporosis Managed with Reclast . Vitamin D  supplementation ongoing. - Continue Reclast  therapy as scheduled. - Increase vitamin D  supplementation to 1000 IU daily.  Vitamin B12 deficiency Currently taking B12 supplements. Recent trend of decreasing B12 levels. - Continue B12 supplementation. - Recheck B12 levels at next visit.  Vitamin D  deficiency Levels have improved with supplementation. - Increase vitamin D  supplementation to 1000 IU daily.  Hyperlipidemia Managed with  pravastatin . - Continue pravastatin  as prescribed.  Gastroesophageal reflux disease -doing well at this time  Occipital headache with pressure Likely due to posture or reclining. - Advise avoiding reclining positions that apply pressure to the back of the head.

## 2024-09-11 ENCOUNTER — Telehealth: Payer: Self-pay

## 2024-09-11 DIAGNOSIS — F339 Major depressive disorder, recurrent, unspecified: Secondary | ICD-10-CM

## 2024-09-11 MED ORDER — BUPROPION HCL ER (XL) 150 MG PO TB24: 150.0000 mg | ORAL_TABLET | Freq: Every morning | ORAL | 0 refills | Status: DC

## 2024-09-11 NOTE — Telephone Encounter (Signed)
 Refill request on buPROPion  (WELLBUTRIN  XL) 150 MG 24 hr tablet

## 2024-09-13 ENCOUNTER — Other Ambulatory Visit: Payer: Self-pay | Admitting: Family Medicine

## 2024-09-13 DIAGNOSIS — F339 Major depressive disorder, recurrent, unspecified: Secondary | ICD-10-CM

## 2024-09-13 NOTE — Telephone Encounter (Unsigned)
 Copied from CRM [id number]. Topic: Clinical - Medication Refill >> Sep 13, 2024  4:23 PM Shanda MATSU wrote: Medication: citalopram  (CELEXA ) 10 MG tablet  Has the patient contacted their pharmacy? Yes, pharmacy referred patient to provider (Agent: If no, request that the patient contact the pharmacy for the refill. If patient does not wish to contact the pharmacy document the reason why and proceed with request.) (Agent: If yes, when and what did the pharmacy advise?)  This is the patient's preferred pharmacy:  [id number] [place], [place] - [address] [address] Phone: [phone] Fax: [phone]  Is this the correct pharmacy for this prescription? Yes If no, delete pharmacy and type the correct one.   Has the prescription been filled recently? No  Is the patient out of the medication? Yes  Has the patient been seen for an appointment in the last year OR does the patient have an upcoming appointment? Yes  Can we respond through MyChart? Yes  Agent: Please be advised that Rx refills may take up to 3 business days. We ask that you follow-up with your pharmacy.

## 2024-09-15 MED ORDER — CITALOPRAM HYDROBROMIDE 10 MG PO TABS: 10.0000 mg | ORAL_TABLET | Freq: Every day | ORAL | 0 refills | Status: DC

## 2024-09-15 NOTE — Telephone Encounter (Signed)
 Requested Prescriptions  Pending Prescriptions Disp Refills   citalopram  (CELEXA ) 10 MG tablet 90 tablet 0    Sig: Take 1 tablet (10 mg total) by mouth daily.     Psychiatry:  Antidepressants - SSRI Passed - 09/15/2024 12:43 PM      Passed - Completed PHQ-2 or PHQ-9 in the last 360 days      Passed - Valid encounter within last 6 months    Recent Outpatient Visits           1 month ago Major depression, recurrent, chronic   [facility] Glenard Mire, MD   5 months ago Major depression, recurrent, chronic   [facility] Vandiver, Mire, MD   6 months ago Intractable migraine without aura and with status migrainosus   [facility] Bernardo Fend, DO   7 months ago Major depression, recurrent, chronic   [facility] Glenard Mire, MD   8 months ago Pre-op exam   [facility] Glenard Mire, MD       Future Appointments             In 2 weeks Gollan, Timothy J, MD [facility]

## 2024-09-17 ENCOUNTER — Other Ambulatory Visit: Payer: Self-pay | Admitting: Family Medicine

## 2024-09-17 DIAGNOSIS — K219 Gastro-esophageal reflux disease without esophagitis: Secondary | ICD-10-CM

## 2024-09-19 ENCOUNTER — Other Ambulatory Visit: Payer: Self-pay | Admitting: Family Medicine

## 2024-09-19 DIAGNOSIS — R0981 Nasal congestion: Secondary | ICD-10-CM

## 2024-09-25 ENCOUNTER — Other Ambulatory Visit: Payer: Self-pay

## 2024-09-25 ENCOUNTER — Telehealth: Payer: Self-pay | Admitting: Family Medicine

## 2024-09-25 DIAGNOSIS — E785 Hyperlipidemia, unspecified: Secondary | ICD-10-CM

## 2024-09-25 MED ORDER — PRAVASTATIN SODIUM 20 MG PO TABS: 20.0000 mg | ORAL_TABLET | Freq: Every day | ORAL | 1 refills | Status: AC

## 2024-09-25 NOTE — Telephone Encounter (Signed)
 Refills sent

## 2024-09-25 NOTE — Telephone Encounter (Signed)
 pravastatin  (PRAVACHOL ) 20 MG tablet   [facility] [address] [place]

## 2024-09-25 NOTE — Telephone Encounter (Signed)
 Can we please schedule pt around 01/29/24 for a follow up, then will give refills

## 2024-09-25 NOTE — Telephone Encounter (Signed)
 Appt sch'd for 3/17 with Dr Glenard

## 2024-10-02 NOTE — Progress Notes (Unsigned)
 Cardiology Office Note  Date:  10/03/2024   ID:  [name], DOB [birth year], MRN [id number]  PCP:  Catherine Mire, MD   Chief Complaint  Patient presents with   4-5 month follow up     Transfer care to [facility] from [facility] for A-Fib. Patient denies chest pain or shortness of breath.     HPI:  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[employer], substitute, works 2 to 3 days a week  Prior imaging reviewed Echo May 2025 Normal left and right ventricular size and function No significant valvular heart disease, no indication of elevated pressures  Afib during knee surgery 2/25  Zio monitor April 2025 Less than 1% A-fib burden otherwise normal sinus rhythm  Chronic leg weakness, no regular exercise Hydrated, drinking 60oz a day  Lab work reviewed TSH 3 Total chol 143, LDL 63 HBA1c 5.3  Family history Dad 94 yo, renal failure Mom dementia 54  EKG personally reviewed by myself on todays visit EKG Interpretation Date/Time:  Tuesday October 03 2024 15:49:01 EST Ventricular Rate:  84 PR Interval:  140 QRS Duration:  76 QT Interval:  392 QTC Calculation: 463 R Axis:   -23  Text Interpretation: Normal sinus rhythm When compared with ECG of 27-Jan-2021 08:06, No significant change was found Confirmed by Perla Lye [phone]) on 10/03/2024 3:53:27 PM    PMH:   has a past medical history of Anemia, Anxiety, Arthritis, Bilateral leg weakness, Complication of anesthesia, Dental bridge present, Depression, Eyes swollen, GERD (gastroesophageal reflux disease), Glaucoma, Hypercholesteremia, Hypertension, Migraine headache, Osteoporosis, SVD (spontaneous vaginal delivery),  and Vertigo.  PSH:    Past Surgical History:  Procedure Laterality Date   ABDOMINAL HYSTERECTOMY     ANAL FISSURE REPAIR  1970s   for bleeding in 1970s   ANTERIOR AND POSTERIOR REPAIR WITH SACROSPINOUS FIXATION N/A 05/30/2018   Procedure: ANTERIOR AND POSTERIOR REPAIR;  Surgeon: Catherine Lenis, MD;  Location: [facility];  Service: Gynecology;  Laterality: N/A;   bilateral cataracts removed Bilateral    COLONOSCOPY  2012   [facility]   COLONOSCOPY WITH PROPOFOL  N/A 09/01/2016   Procedure: COLONOSCOPY WITH PROPOFOL ;  Surgeon: Catherine LELON Cota, MD;  Location: [facility];  Service: Endoscopy;  Laterality: N/A;   COLONOSCOPY WITH PROPOFOL  N/A 09/10/2021   Procedure: COLONOSCOPY WITH PROPOFOL ;  Surgeon: Hawkins Catherine LELON, MD;  Location: [facility];  Service: Endoscopy;  Laterality: N/A;   ESOPHAGOGASTRODUODENOSCOPY (EGD) WITH PROPOFOL  N/A 01/05/2023   Procedure: ESOPHAGOGASTRODUODENOSCOPY (EGD) WITH PROPOFOL ;  Surgeon: Catherine Corinn Skiff, MD;  Location: [facility];  Service: Gastroenterology;  Laterality: N/A;   EYE SURGERY Bilateral    cataracts removed   KNEE SURGERY Left    SALPINGOOPHORECTOMY Right 05/30/2018   Procedure: SALPINGO OOPHORECTOMY;  Surgeon: Catherine Lenis, MD;  [facility];  Service: Gynecology;  Laterality: Right;   SUBCLAVIAN ANGIOGRAM     TOTAL HIP ARTHROPLASTY Left 01/28/2021   Procedure: TOTAL HIP ARTHROPLASTY ANTERIOR APPROACH;  Surgeon: Catherine Sharper, MD;  Location: [facility];  Service: Orthopedics;  Laterality: Left;   TOTAL KNEE ARTHROPLASTY Right 04/02/2020   Procedure: TOTAL KNEE ARTHROPLASTY;  Surgeon: Catherine Cough, MD;  [facility];  Service: Orthopedics;  Laterality: Right;  70 mins   TOTAL KNEE ARTHROPLASTY Left 01/11/2024   Procedure: TOTAL KNEE ARTHROPLASTY;  Surgeon: Catherine Cough, MD;  [facility];  Service: Orthopedics;  Laterality: Left;  70 mins   UPPER GI ENDOSCOPY  2012   VAGINAL HYSTERECTOMY N/A 05/30/2018   Procedure: HYSTERECTOMY  VAGINAL;  Surgeon: Catherine Lenis, MD;  [facility];  Service: Gynecology;  Laterality: N/A;   WISDOM TOOTH EXTRACTION      Current Outpatient Medications  Medication Sig Dispense Refill   acetaminophen  (TYLENOL ) 500 MG tablet Take 500-1,000 mg by mouth every 6 (six) hours as needed for mild pain (pain score 1-3) or moderate pain (pain score 4-6).     apixaban  (ELIQUIS ) 5 MG TABS tablet Take 1 tablet (5 mg total) by mouth 2 (two) times daily.     brimonidine  (ALPHAGAN ) 0.2 % ophthalmic solution Place 1 drop into both eyes 3 (three) times daily.     buPROPion  (WELLBUTRIN  XL) 150 MG 24 hr tablet Take 1 tablet (150 mg total) by mouth in the morning. 90 tablet 0   Cholecalciferol (VITAMIN D3) 50 MCG (2000 UT) capsule Take 1 capsule (2,000 Units total) by mouth daily. 100 capsule 1   citalopram  (CELEXA ) 10 MG tablet Take 1 tablet (10 mg total) by mouth daily. 90 tablet 0   diltiazem  (CARDIZEM ) 30 MG tablet TAKE 1 TABLET 4 TIMES A DAY AS NEEDED FOR PALPITATIONS OR SUSTAINED HEART RATE GREATER THEN 110. 360 tablet 3   fluticasone  (FLONASE ) 50 MCG/ACT nasal spray PLACE 2 SPRAYS INTO BOTH NOSTRILS DAILY AS NEEDED FOR ALLERGIES OR RHINITIS. 48 mL 0   latanoprost  (XALATAN ) 0.005 % ophthalmic solution Place 1 drop into both eyes at bedtime.     omeprazole  (PRILOSEC) 40 MG capsule TAKE 1 CAPSULE (40 MG TOTAL) BY MOUTH DAILY. 90 capsule 0   Polyvinyl Alcohol-Povidone (REFRESH OP) Place 2 drops into both eyes 3 (three) times daily as needed (dry eye).     pravastatin  (PRAVACHOL ) 20 MG tablet Take 1 tablet (20 mg total) by mouth daily. 90 tablet 1   vitamin B-12 (CYANOCOBALAMIN ) 500 MCG tablet Take 500 mcg by mouth daily.     No current facility-administered medications for this visit.     Allergies:   Biphosphate, Bisphosphonates, Brimonidine , Codeine, Combigan [brimonidine  tartrate-timolol], Sulfa antibiotics, and Metronidazole   Social History:  The patient  reports that she has never smoked. She has  never used smokeless tobacco. She reports current alcohol use. She reports that she does not use drugs.   Family History:   family history includes Alzheimer's disease in her mother; Cancer in her brother and brother; Cancer (age of onset: 78) in her son; Cancer (age of onset: 15) in her mother; Cerebral aneurysm in her father; Depression in her brother, sister, and sister; Diabetes in her sister; Gallbladder disease in her brother; Hypertension in her father, mother, and sister; Lymphoma in her son; Prostate cancer in her brother and father.    Review of Systems: Review of Systems  Constitutional: Negative.   HENT: Negative.    Respiratory: Negative.    Cardiovascular: Negative.   Gastrointestinal: Negative.   Musculoskeletal: Negative.   Neurological: Negative.   Psychiatric/Behavioral: Negative.    All other systems reviewed and are negative.    PHYSICAL EXAM: VS:  BP (!) 142/90 (BP Location: Left Arm, Patient Position: Sitting, Cuff Size: Normal)   Pulse 84   Ht 5' 2 (1.575 m)   Wt 151 lb (68.5 kg)   SpO2 96%  BMI 27.62 kg/m  , BMI Body mass index is 27.62 kg/m. Constitutional:  oriented to person, place, and time. No distress.  HENT:  Head: Grossly normal Eyes:  no discharge. No scleral icterus.  Neck: No JVD, no carotid bruits  Cardiovascular: Regular rate and rhythm, no murmurs appreciated Pulmonary/Chest: Clear to auscultation bilaterally, no wheezes or rales Abdominal: Soft.  no distension.  no tenderness.  Musculoskeletal: Normal range of motion Neurological:  normal muscle tone. Coordination normal. No atrophy Skin: Skin warm and dry Psychiatric: normal affect, pleasant   Recent Labs: 03/29/2024: ALT 11; BUN 16; Creat 0.97; Hemoglobin 13.3; Platelets 269; Potassium 4.3; Sodium 142; TSH 2.62    Lipid Panel Lab Results  Component Value Date   CHOL 168 06/25/2023   HDL 74 06/25/2023   LDLCALC 76 06/25/2023   TRIG 94 06/25/2023      Wt Readings from  Last 3 Encounters:  10/03/24 151 lb (68.5 kg)  07/31/24 147 lb 3.2 oz (66.8 kg)  04/25/24 149 lb 3.2 oz (67.7 kg)        ASSESSMENT AND PLAN:  Problem List Items Addressed This Visit       Cardiology Problems   Paroxysmal tachycardia (HCC)   Relevant Orders   EKG 12-Lead (Completed)     Other   Dyslipidemia   Other Visit Diagnoses       PAF (paroxysmal atrial fibrillation) (HCC)    -  Primary   Relevant Orders   EKG 12-Lead (Completed)      Paroxysmal atrial fibrillation Rare episodes, less than 1% on Zio monitor Monitor results pulled up and reviewed with her, 2 short episodes over 14 days Discussed instruments to track heart rate at home including pulse oximeter, her blood pressure cuff Denies significant tachycardia symptoms -Has not had to take diltiazem  as needed - She will continue on Eliquis  5 twice daily  GERD On PPI, symptoms stable  Hyperlipidemia Tolerating pravastatin     Signed, Velinda Lunger, M.D., Ph.D. [facility] [place], [place] [phone]

## 2024-10-03 ENCOUNTER — Encounter: Payer: Self-pay | Admitting: Cardiovascular Disease

## 2024-10-03 ENCOUNTER — Ambulatory Visit: Attending: Cardiovascular Disease | Admitting: Cardiovascular Disease

## 2024-10-03 VITALS — BP 142/90 | HR 84 | Ht 62.0 in | Wt 151.0 lb

## 2024-10-03 DIAGNOSIS — I480 Paroxysmal atrial fibrillation: Secondary | ICD-10-CM | POA: Diagnosis not present

## 2024-10-03 DIAGNOSIS — I479 Paroxysmal tachycardia, unspecified: Secondary | ICD-10-CM | POA: Diagnosis not present

## 2024-10-03 DIAGNOSIS — E785 Hyperlipidemia, unspecified: Secondary | ICD-10-CM | POA: Diagnosis not present

## 2024-10-03 NOTE — Patient Instructions (Addendum)
 Think about buying a pulse oximeter  Goal blood pressure on the top:115 to 135 On the bottom: 60 to 85  Normal pulse 60 to 90  If >100, could be atrial fibrillation  Medication Instructions:  No changes  If you need a refill on your cardiac medications before your next appointment, please call your pharmacy.   Lab work: No new labs needed  Testing/Procedures: No new testing needed  Follow-Up: At [facility], you and your health needs are our priority.  As part of our continuing mission to provide you with exceptional heart care, we have created designated Provider Care Teams.  These Care Teams include your primary Cardiologist (physician) and Advanced Practice Providers (APPs -  Physician Assistants and Nurse Practitioners) who all work together to provide you with the care you need, when you need it.  You will need a follow up appointment in 12 months  Providers on your designated Care Team:   Lonni Meager, NP Bernardino Bring, PA-C [place]ence [place], [place]  COVID-19 Vaccine Information can be found at: [email or web address] For questions related to vaccine distribution or appointments, please email [email or web address] or call [phone].

## 2024-10-05 ENCOUNTER — Other Ambulatory Visit: Payer: Self-pay | Admitting: Cardiology

## 2024-10-05 MED ORDER — APIXABAN 5 MG PO TABS: 5.0000 mg | ORAL_TABLET | Freq: Two times a day (BID) | ORAL | 5 refills | Status: AC

## 2024-10-05 NOTE — Telephone Encounter (Signed)
 Prescription refill request for Eliquis  received. Indication:afib Last office visit:11/25 Scr: 0.8  8/25 Age:83 Weight:68.5  kg  Prescription refilled

## 2024-11-21 ENCOUNTER — Telehealth: Payer: Self-pay | Admitting: Cardiovascular Disease

## 2024-11-21 NOTE — Telephone Encounter (Signed)
"  ° °  Pre-operative Risk Assessment    Patient Name: [name]  DOB: 08-09-1941 MRN: [id number]   Date of last office visit: 10/03/24 Date of next office visit: Not yet scheduled  Request for Surgical Clearance    Procedure:  Dental Crowns and Buildup  Date of Surgery:  Clearance TBD                                Surgeon:  Dr. Juliene Shelter Surgeon's Group or Practice Name:  [facility] Phone number:  [phone]  Fax number:  [phone]   Type of Clearance Requested:   - Pharmacy:  Hold Apixaban  (Eliquis )     Type of Anesthesia:  Septo   Additional requests/questions:  Caller Sandor) wants to know how long patient will need to hold Eliquis  prior to procedure.    Signed, Jasmin B Wilson   11/21/2024, 11:37 AM   "

## 2024-11-21 NOTE — Telephone Encounter (Signed)
 Left a message for the DDS office to call back and confirm the dental procedure to be done at this time.

## 2024-11-22 NOTE — Telephone Encounter (Signed)
"  ° °  Patient Name: [name]  DOB: Dec 01, 1940 MRN: [id number]  Primary Cardiologist: Alvan Ronal FORBES, MD (Inactive)  Chart reviewed as part of pre-operative protocol coverage.   Simple dental extractions (i.e. 1-2 teeth) and crowns are considered low risk procedures per guidelines and generally do not require any specific cardiac clearance. It is also generally accepted that for simple extractions and crowns, there is no need to interrupt blood thinner therapy.    SBE prophylaxis is not required for the patient from a cardiac standpoint.  I will route this recommendation to the requesting party via Epic fax function and remove from pre-op pool.  Please call with questions.  Rollo FABIENE Louder, PA-C 11/22/2024, 12:22 PM  "

## 2024-11-22 NOTE — Telephone Encounter (Signed)
 I confirmed with dental office staff Shay, the procedure is for 2 dental crowns, the build up is product that may be used in the process for the crowns.

## 2024-12-07 ENCOUNTER — Other Ambulatory Visit: Payer: Self-pay | Admitting: Family Medicine

## 2024-12-07 DIAGNOSIS — F339 Major depressive disorder, recurrent, unspecified: Secondary | ICD-10-CM

## 2024-12-07 NOTE — Telephone Encounter (Signed)
 Requested Prescriptions  Pending Prescriptions Disp Refills   buPROPion  (WELLBUTRIN  XL) 150 MG 24 hr tablet [Pharmacy Med Name: BUPROPION  HCL XL 150 MG TABLET] 90 tablet 0    Sig: TAKE 1 TABLET (150 MG TOTAL) BY MOUTH IN THE MORNING     Psychiatry: Antidepressants - bupropion  Failed - 12/07/2024 12:19 PM      Failed - Cr in normal range and within 360 days    Creat  Date Value Ref Range Status  03/29/2024 0.97 (H) 0.60 - 0.95 mg/dL Final         Failed - Last BP in normal range    BP Readings from Last 1 Encounters:  10/03/24 (!) 142/90         Passed - AST in normal range and within 360 days    AST  Date Value Ref Range Status  03/29/2024 17 10 - 35 U/L Final         Passed - ALT in normal range and within 360 days    ALT  Date Value Ref Range Status  03/29/2024 11 6 - 29 U/L Final         Passed - Completed PHQ-2 or PHQ-9 in the last 360 days      Passed - Valid encounter within last 6 months    Recent Outpatient Visits           4 months ago Major depression, recurrent, chronic   [place] [facility] Glenard Mire, MD   8 months ago Major depression, recurrent, chronic   [place] [facility] [place], Mire, MD   9 months ago Intractable migraine without aura and with status migrainosus   [facility] Bernardo Fend, DO   10 months ago Major depression, recurrent, chronic   [facility] Glenard Mire, MD   11 months ago Pre-op exam   [facility] Sowles, Krichna, MD

## 2024-12-13 ENCOUNTER — Other Ambulatory Visit: Payer: Self-pay | Admitting: Family Medicine

## 2024-12-13 DIAGNOSIS — R0981 Nasal congestion: Secondary | ICD-10-CM

## 2024-12-13 DIAGNOSIS — F339 Major depressive disorder, recurrent, unspecified: Secondary | ICD-10-CM

## 2024-12-14 ENCOUNTER — Other Ambulatory Visit: Payer: Self-pay | Admitting: Family Medicine

## 2024-12-14 DIAGNOSIS — K219 Gastro-esophageal reflux disease without esophagitis: Secondary | ICD-10-CM

## 2024-12-14 NOTE — Telephone Encounter (Signed)
 Requested Prescriptions  Pending Prescriptions Disp Refills   citalopram  (CELEXA ) 10 MG tablet [Pharmacy Med Name: CITALOPRAM  HBR 10 MG TABLET] 90 tablet 0    Sig: TAKE 1 TABLET BY MOUTH EVERY DAY     Psychiatry:  Antidepressants - SSRI Passed - 12/14/2024 12:00 PM      Passed - Completed PHQ-2 or PHQ-9 in the last 360 days      Passed - Valid encounter within last 6 months    Recent Outpatient Visits           4 months ago Major depression, recurrent, chronic   [place] [facility] Glenard Mire, MD   8 months ago Major depression, recurrent, chronic   [place] [facility] [place], Mire, MD   9 months ago Intractable migraine without aura and with status migrainosus   [facility] Bernardo Fend, DO   10 months ago Major depression, recurrent, chronic   [place] [facility] Glenard Mire, MD   11 months ago Pre-op exam   [facility] [place], Krichna, MD               fluticasone  (FLONASE ) 50 MCG/ACT nasal spray [Pharmacy Med Name: FLUTICASONE  PROP 50 MCG SPRAY] 48 mL 0    Sig: PLACE 2 SPRAYS INTO BOTH NOSTRILS DAILY AS NEEDED FOR ALLERGIES OR RHINITIS.     Ear, Nose, and Throat: Nasal Preparations - Corticosteroids Passed - 12/14/2024 12:00 PM      Passed - Valid encounter within last 12 months    Recent Outpatient Visits           4 months ago Major depression, recurrent, chronic   [place] [facility] Glenard Mire, MD   8 months ago Major depression, recurrent, chronic   [place] [facility] [place], Mire, MD   9 months ago Intractable migraine without aura and with status migrainosus   [facility] Bernardo Fend, DO   10 months ago Major depression, recurrent, chronic   [facility] Glenard Mire, MD   11 months ago Pre-op exam   [facility] Sow[place], Krichna, MD

## 2024-12-14 NOTE — Telephone Encounter (Signed)
 Requested Prescriptions  Pending Prescriptions Disp Refills   omeprazole  (PRILOSEC) 40 MG capsule [Pharmacy Med Name: OMEPRAZOLE  DR 40 MG CAPSULE] 90 capsule 0    Sig: TAKE 1 CAPSULE (40 MG TOTAL) BY MOUTH DAILY.     Gastroenterology: Proton Pump Inhibitors Passed - 12/14/2024  3:53 PM      Passed - Valid encounter within last 12 months    Recent Outpatient Visits           4 months ago Major depression, recurrent, chronic   [place] [facility] Glenard Mire, MD   8 months ago Major depression, recurrent, chronic   [place] [facility] [place], Mire, MD   9 months ago Intractable migraine without aura and with status migrainosus   [facility] Bernardo Fend, DO   10 months ago Major depression, recurrent, chronic   [facility] Glenard Mire, MD   11 months ago Pre-op exam   [facility] Sowles, Krichna, MD

## 2025-01-30 ENCOUNTER — Ambulatory Visit: Admitting: Family Medicine

## 2025-06-21 ENCOUNTER — Ambulatory Visit
